# Patient Record
Sex: Male | Born: 1940
Health system: Southern US, Community
[De-identification: ages and names within clinical notes are randomized; demographics above are authoritative.]

## PROBLEM LIST (undated history)

## (undated) DIAGNOSIS — E785 Hyperlipidemia, unspecified: Secondary | ICD-10-CM

## (undated) DIAGNOSIS — I252 Old myocardial infarction: Secondary | ICD-10-CM

## (undated) DIAGNOSIS — C439 Malignant melanoma of skin, unspecified: Secondary | ICD-10-CM

## (undated) DIAGNOSIS — E118 Type 2 diabetes mellitus with unspecified complications: Secondary | ICD-10-CM

## (undated) DIAGNOSIS — M1990 Unspecified osteoarthritis, unspecified site: Secondary | ICD-10-CM

## (undated) DIAGNOSIS — I10 Essential (primary) hypertension: Secondary | ICD-10-CM

## (undated) DIAGNOSIS — M1712 Unilateral primary osteoarthritis, left knee: Secondary | ICD-10-CM

## (undated) DIAGNOSIS — I219 Acute myocardial infarction, unspecified: Secondary | ICD-10-CM

## (undated) DIAGNOSIS — H3530 Unspecified macular degeneration: Secondary | ICD-10-CM

## (undated) DIAGNOSIS — I639 Cerebral infarction, unspecified: Secondary | ICD-10-CM

## (undated) DIAGNOSIS — Z95828 Presence of other vascular implants and grafts: Secondary | ICD-10-CM

## (undated) DIAGNOSIS — I358 Other nonrheumatic aortic valve disorders: Secondary | ICD-10-CM

## (undated) DIAGNOSIS — Z9861 Coronary angioplasty status: Secondary | ICD-10-CM

## (undated) DIAGNOSIS — J309 Allergic rhinitis, unspecified: Secondary | ICD-10-CM

## (undated) DIAGNOSIS — E119 Type 2 diabetes mellitus without complications: Secondary | ICD-10-CM

## (undated) DIAGNOSIS — C679 Malignant neoplasm of bladder, unspecified: Secondary | ICD-10-CM

## (undated) DIAGNOSIS — I2510 Atherosclerotic heart disease of native coronary artery without angina pectoris: Secondary | ICD-10-CM

## (undated) DIAGNOSIS — Z789 Other specified health status: Secondary | ICD-10-CM

## (undated) HISTORY — DX: Hyperlipidemia, unspecified: E78.5

## (undated) HISTORY — DX: Presence of other vascular implants and grafts: Z95.828

## (undated) HISTORY — PX: CARDIAC CATHETERIZATION: SHX172

## (undated) HISTORY — PX: TONSILLECTOMY: SUR1361

## (undated) HISTORY — DX: Atherosclerotic heart disease of native coronary artery without angina pectoris: I25.10

## (undated) HISTORY — DX: Type 2 diabetes mellitus with unspecified complications: E11.8

## (undated) HISTORY — DX: Unspecified osteoarthritis, unspecified site: M19.90

## (undated) HISTORY — DX: Unspecified macular degeneration: H35.30

## (undated) HISTORY — DX: Coronary angioplasty status: Z98.61

## (undated) HISTORY — PX: CATARACT EXTRACTION W/ INTRAOCULAR LENS IMPLANT: SHX1309

## (undated) HISTORY — DX: Old myocardial infarction: I25.2

## (undated) HISTORY — DX: Essential (primary) hypertension: I10

## (undated) HISTORY — PX: INGUINAL HERNIA REPAIR: SUR1180

## (undated) HISTORY — DX: Allergic rhinitis, unspecified: J30.9

## (undated) HISTORY — DX: Other nonrheumatic aortic valve disorders: I35.8

## (undated) HISTORY — DX: Other specified health status: Z78.9

---

## 1979-03-30 DIAGNOSIS — I639 Cerebral infarction, unspecified: Secondary | ICD-10-CM

## 1979-03-30 HISTORY — DX: Cerebral infarction, unspecified: I63.9

## 2000-11-06 ENCOUNTER — Encounter (INDEPENDENT_AMBULATORY_CARE_PROVIDER_SITE_OTHER): Payer: Self-pay | Admitting: Internal Medicine

## 2000-11-06 ENCOUNTER — Ambulatory Visit (HOSPITAL_COMMUNITY): Admission: RE | Admit: 2000-11-06 | Discharge: 2000-11-06 | Payer: Self-pay | Admitting: Internal Medicine

## 2000-11-13 ENCOUNTER — Ambulatory Visit (HOSPITAL_COMMUNITY): Admission: RE | Admit: 2000-11-13 | Discharge: 2000-11-13 | Payer: Self-pay | Admitting: Family Medicine

## 2000-11-13 ENCOUNTER — Encounter: Payer: Self-pay | Admitting: Family Medicine

## 2000-11-20 ENCOUNTER — Ambulatory Visit (HOSPITAL_COMMUNITY): Admission: RE | Admit: 2000-11-20 | Discharge: 2000-11-20 | Payer: Self-pay | Admitting: Internal Medicine

## 2000-11-20 ENCOUNTER — Encounter (INDEPENDENT_AMBULATORY_CARE_PROVIDER_SITE_OTHER): Payer: Self-pay | Admitting: Internal Medicine

## 2001-05-14 ENCOUNTER — Encounter: Payer: Self-pay | Admitting: Family Medicine

## 2001-05-14 ENCOUNTER — Ambulatory Visit (HOSPITAL_COMMUNITY): Admission: RE | Admit: 2001-05-14 | Discharge: 2001-05-14 | Payer: Self-pay | Admitting: Family Medicine

## 2002-04-02 ENCOUNTER — Encounter: Payer: Self-pay | Admitting: Dermatology

## 2002-04-02 ENCOUNTER — Ambulatory Visit (HOSPITAL_COMMUNITY): Admission: RE | Admit: 2002-04-02 | Discharge: 2002-04-02 | Payer: Self-pay | Admitting: Dermatology

## 2002-11-12 ENCOUNTER — Encounter (INDEPENDENT_AMBULATORY_CARE_PROVIDER_SITE_OTHER): Payer: Self-pay | Admitting: Internal Medicine

## 2002-11-12 ENCOUNTER — Ambulatory Visit (HOSPITAL_COMMUNITY): Admission: RE | Admit: 2002-11-12 | Discharge: 2002-11-12 | Payer: Self-pay | Admitting: Internal Medicine

## 2003-02-22 ENCOUNTER — Ambulatory Visit (HOSPITAL_COMMUNITY): Admission: RE | Admit: 2003-02-22 | Discharge: 2003-02-22 | Payer: Self-pay | Admitting: Internal Medicine

## 2003-02-22 ENCOUNTER — Encounter: Payer: Self-pay | Admitting: Internal Medicine

## 2003-03-01 ENCOUNTER — Ambulatory Visit (HOSPITAL_COMMUNITY): Admission: RE | Admit: 2003-03-01 | Discharge: 2003-03-01 | Payer: Self-pay | Admitting: Internal Medicine

## 2003-03-01 ENCOUNTER — Encounter: Payer: Self-pay | Admitting: Internal Medicine

## 2003-03-17 ENCOUNTER — Ambulatory Visit (HOSPITAL_COMMUNITY): Admission: RE | Admit: 2003-03-17 | Discharge: 2003-03-17 | Payer: Self-pay | Admitting: Internal Medicine

## 2003-03-17 ENCOUNTER — Encounter (INDEPENDENT_AMBULATORY_CARE_PROVIDER_SITE_OTHER): Payer: Self-pay | Admitting: Internal Medicine

## 2003-04-12 ENCOUNTER — Encounter (INDEPENDENT_AMBULATORY_CARE_PROVIDER_SITE_OTHER): Payer: Self-pay | Admitting: Internal Medicine

## 2003-04-12 ENCOUNTER — Ambulatory Visit (HOSPITAL_COMMUNITY): Admission: RE | Admit: 2003-04-12 | Discharge: 2003-04-12 | Payer: Self-pay | Admitting: Internal Medicine

## 2003-06-13 ENCOUNTER — Emergency Department (HOSPITAL_COMMUNITY): Admission: EM | Admit: 2003-06-13 | Discharge: 2003-06-13 | Payer: Self-pay | Admitting: Emergency Medicine

## 2003-06-16 ENCOUNTER — Ambulatory Visit (HOSPITAL_COMMUNITY): Admission: RE | Admit: 2003-06-16 | Discharge: 2003-06-16 | Payer: Self-pay | Admitting: Internal Medicine

## 2003-06-29 HISTORY — PX: PARTIAL KNEE ARTHROPLASTY: SHX2174

## 2003-07-13 ENCOUNTER — Inpatient Hospital Stay (HOSPITAL_COMMUNITY): Admission: RE | Admit: 2003-07-13 | Discharge: 2003-07-15 | Payer: Self-pay | Admitting: Orthopaedic Surgery

## 2003-11-27 HISTORY — PX: TOTAL KNEE ARTHROPLASTY: SHX125

## 2003-12-14 ENCOUNTER — Inpatient Hospital Stay (HOSPITAL_COMMUNITY): Admission: RE | Admit: 2003-12-14 | Discharge: 2003-12-19 | Payer: Self-pay | Admitting: Orthopedic Surgery

## 2003-12-22 ENCOUNTER — Ambulatory Visit (HOSPITAL_COMMUNITY): Admission: RE | Admit: 2003-12-22 | Discharge: 2003-12-22 | Payer: Self-pay | Admitting: Orthopedic Surgery

## 2004-01-03 ENCOUNTER — Encounter (HOSPITAL_COMMUNITY): Admission: RE | Admit: 2004-01-03 | Discharge: 2004-02-02 | Payer: Self-pay | Admitting: Orthopedic Surgery

## 2004-02-03 ENCOUNTER — Encounter (HOSPITAL_COMMUNITY): Admission: RE | Admit: 2004-02-03 | Discharge: 2004-03-04 | Payer: Self-pay | Admitting: Orthopedic Surgery

## 2004-02-24 ENCOUNTER — Ambulatory Visit (HOSPITAL_COMMUNITY): Admission: RE | Admit: 2004-02-24 | Discharge: 2004-02-24 | Payer: Self-pay | Admitting: Family Medicine

## 2004-03-06 ENCOUNTER — Encounter (HOSPITAL_COMMUNITY): Admission: RE | Admit: 2004-03-06 | Discharge: 2004-04-05 | Payer: Self-pay | Admitting: Orthopedic Surgery

## 2004-05-31 ENCOUNTER — Ambulatory Visit: Payer: Self-pay | Admitting: Orthopedic Surgery

## 2004-10-10 ENCOUNTER — Ambulatory Visit: Payer: Self-pay | Admitting: Orthopedic Surgery

## 2004-10-18 ENCOUNTER — Ambulatory Visit (HOSPITAL_COMMUNITY): Admission: RE | Admit: 2004-10-18 | Discharge: 2004-10-18 | Payer: Self-pay | Admitting: Dermatology

## 2004-10-23 ENCOUNTER — Ambulatory Visit (HOSPITAL_COMMUNITY): Admission: RE | Admit: 2004-10-23 | Discharge: 2004-10-23 | Payer: Self-pay | Admitting: Dermatology

## 2004-11-21 ENCOUNTER — Ambulatory Visit: Payer: Self-pay | Admitting: Orthopedic Surgery

## 2004-12-10 ENCOUNTER — Ambulatory Visit: Payer: Self-pay | Admitting: Orthopedic Surgery

## 2004-12-19 ENCOUNTER — Ambulatory Visit (HOSPITAL_COMMUNITY): Admission: RE | Admit: 2004-12-19 | Discharge: 2004-12-19 | Payer: Self-pay | Admitting: Rheumatology

## 2004-12-31 ENCOUNTER — Ambulatory Visit (HOSPITAL_COMMUNITY): Payer: Self-pay | Admitting: General Surgery

## 2004-12-31 ENCOUNTER — Encounter (HOSPITAL_COMMUNITY): Admission: RE | Admit: 2004-12-31 | Discharge: 2005-01-30 | Payer: Self-pay | Admitting: General Surgery

## 2005-01-09 ENCOUNTER — Ambulatory Visit (HOSPITAL_COMMUNITY): Admission: RE | Admit: 2005-01-09 | Discharge: 2005-01-09 | Payer: Self-pay | Admitting: Internal Medicine

## 2005-02-18 ENCOUNTER — Ambulatory Visit (HOSPITAL_COMMUNITY): Admission: RE | Admit: 2005-02-18 | Discharge: 2005-02-18 | Payer: Self-pay | Admitting: Family Medicine

## 2005-04-03 ENCOUNTER — Ambulatory Visit (HOSPITAL_COMMUNITY): Payer: Self-pay | Admitting: General Surgery

## 2005-04-03 ENCOUNTER — Encounter (HOSPITAL_COMMUNITY): Admission: RE | Admit: 2005-04-03 | Discharge: 2005-04-27 | Payer: Self-pay | Admitting: General Surgery

## 2005-04-29 ENCOUNTER — Ambulatory Visit (HOSPITAL_COMMUNITY): Admission: RE | Admit: 2005-04-29 | Discharge: 2005-04-29 | Payer: Self-pay | Admitting: Dermatology

## 2005-05-27 ENCOUNTER — Encounter (HOSPITAL_COMMUNITY): Admission: RE | Admit: 2005-05-27 | Discharge: 2005-06-26 | Payer: Self-pay

## 2005-05-30 ENCOUNTER — Encounter (HOSPITAL_COMMUNITY): Admission: RE | Admit: 2005-05-30 | Discharge: 2005-06-29 | Payer: Self-pay | Admitting: General Surgery

## 2005-06-24 ENCOUNTER — Ambulatory Visit (HOSPITAL_COMMUNITY): Admission: RE | Admit: 2005-06-24 | Discharge: 2005-06-24 | Payer: Self-pay | Admitting: Dermatology

## 2005-08-08 ENCOUNTER — Ambulatory Visit (HOSPITAL_COMMUNITY): Payer: Self-pay | Admitting: Family Medicine

## 2005-08-08 ENCOUNTER — Encounter (HOSPITAL_COMMUNITY): Admission: RE | Admit: 2005-08-08 | Discharge: 2005-09-07 | Payer: Self-pay | Admitting: General Surgery

## 2005-10-03 ENCOUNTER — Ambulatory Visit (HOSPITAL_COMMUNITY): Payer: Self-pay | Admitting: General Surgery

## 2005-10-03 ENCOUNTER — Encounter (HOSPITAL_COMMUNITY): Admission: RE | Admit: 2005-10-03 | Discharge: 2005-11-02 | Payer: Self-pay | Admitting: Oncology

## 2005-10-28 ENCOUNTER — Ambulatory Visit (HOSPITAL_COMMUNITY): Admission: RE | Admit: 2005-10-28 | Discharge: 2005-10-28 | Payer: Self-pay | Admitting: Dermatology

## 2005-11-19 ENCOUNTER — Ambulatory Visit (HOSPITAL_BASED_OUTPATIENT_CLINIC_OR_DEPARTMENT_OTHER): Admission: RE | Admit: 2005-11-19 | Discharge: 2005-11-19 | Payer: Self-pay | Admitting: Urology

## 2005-11-26 DIAGNOSIS — C679 Malignant neoplasm of bladder, unspecified: Secondary | ICD-10-CM

## 2005-11-26 HISTORY — DX: Malignant neoplasm of bladder, unspecified: C67.9

## 2005-11-26 HISTORY — PX: OTHER SURGICAL HISTORY: SHX169

## 2005-11-29 ENCOUNTER — Ambulatory Visit (HOSPITAL_COMMUNITY): Payer: Self-pay | Admitting: General Surgery

## 2005-11-29 ENCOUNTER — Encounter (HOSPITAL_COMMUNITY): Admission: RE | Admit: 2005-11-29 | Discharge: 2005-12-29 | Payer: Self-pay | Admitting: Oncology

## 2005-12-06 ENCOUNTER — Ambulatory Visit (HOSPITAL_BASED_OUTPATIENT_CLINIC_OR_DEPARTMENT_OTHER): Admission: RE | Admit: 2005-12-06 | Discharge: 2005-12-06 | Payer: Self-pay | Admitting: Urology

## 2005-12-06 ENCOUNTER — Encounter (INDEPENDENT_AMBULATORY_CARE_PROVIDER_SITE_OTHER): Payer: Self-pay | Admitting: Specialist

## 2006-01-24 ENCOUNTER — Encounter (HOSPITAL_COMMUNITY): Admission: RE | Admit: 2006-01-24 | Discharge: 2006-02-23 | Payer: Self-pay | Admitting: Oncology

## 2006-02-13 ENCOUNTER — Ambulatory Visit: Payer: Self-pay | Admitting: Internal Medicine

## 2006-03-19 ENCOUNTER — Ambulatory Visit (HOSPITAL_COMMUNITY): Payer: Self-pay | Admitting: General Surgery

## 2006-03-19 ENCOUNTER — Encounter (HOSPITAL_COMMUNITY): Admission: RE | Admit: 2006-03-19 | Discharge: 2006-04-18 | Payer: Self-pay | Admitting: General Surgery

## 2006-04-22 ENCOUNTER — Ambulatory Visit (HOSPITAL_COMMUNITY): Admission: RE | Admit: 2006-04-22 | Discharge: 2006-04-22 | Payer: Self-pay | Admitting: Ophthalmology

## 2006-05-01 ENCOUNTER — Ambulatory Visit (HOSPITAL_COMMUNITY): Admission: RE | Admit: 2006-05-01 | Discharge: 2006-05-01 | Payer: Self-pay | Admitting: Dermatology

## 2006-05-14 ENCOUNTER — Encounter (HOSPITAL_COMMUNITY): Admission: RE | Admit: 2006-05-14 | Discharge: 2006-06-13 | Payer: Self-pay | Admitting: Oncology

## 2006-07-09 ENCOUNTER — Encounter (HOSPITAL_COMMUNITY): Admission: RE | Admit: 2006-07-09 | Discharge: 2006-07-28 | Payer: Self-pay | Admitting: General Surgery

## 2006-07-09 ENCOUNTER — Ambulatory Visit (HOSPITAL_COMMUNITY): Payer: Self-pay | Admitting: General Surgery

## 2006-09-12 ENCOUNTER — Encounter (HOSPITAL_COMMUNITY): Admission: RE | Admit: 2006-09-12 | Discharge: 2006-10-12 | Payer: Self-pay | Admitting: Family Medicine

## 2006-09-12 ENCOUNTER — Ambulatory Visit (HOSPITAL_COMMUNITY): Payer: Self-pay | Admitting: Family Medicine

## 2006-11-07 ENCOUNTER — Encounter (HOSPITAL_COMMUNITY): Admission: RE | Admit: 2006-11-07 | Discharge: 2006-12-07 | Payer: Self-pay | Admitting: Family Medicine

## 2006-11-21 ENCOUNTER — Ambulatory Visit (HOSPITAL_COMMUNITY): Payer: Self-pay | Admitting: Family Medicine

## 2006-12-09 ENCOUNTER — Ambulatory Visit (HOSPITAL_COMMUNITY): Admission: RE | Admit: 2006-12-09 | Discharge: 2006-12-09 | Payer: Self-pay | Admitting: Family Medicine

## 2006-12-24 ENCOUNTER — Ambulatory Visit: Payer: Self-pay | Admitting: Internal Medicine

## 2007-01-27 ENCOUNTER — Ambulatory Visit (HOSPITAL_COMMUNITY): Payer: Self-pay | Admitting: Oncology

## 2007-01-27 ENCOUNTER — Encounter (HOSPITAL_COMMUNITY): Admission: RE | Admit: 2007-01-27 | Discharge: 2007-02-26 | Payer: Self-pay | Admitting: Family Medicine

## 2007-04-16 ENCOUNTER — Encounter (HOSPITAL_COMMUNITY): Admission: RE | Admit: 2007-04-16 | Discharge: 2007-04-28 | Payer: Self-pay | Admitting: Family Medicine

## 2007-04-16 ENCOUNTER — Ambulatory Visit (HOSPITAL_COMMUNITY): Payer: Self-pay | Admitting: Oncology

## 2007-06-11 ENCOUNTER — Encounter (HOSPITAL_COMMUNITY): Admission: RE | Admit: 2007-06-11 | Discharge: 2007-07-11 | Payer: Self-pay | Admitting: Pulmonary Disease

## 2007-06-11 ENCOUNTER — Ambulatory Visit (HOSPITAL_COMMUNITY): Payer: Self-pay | Admitting: Pulmonary Disease

## 2007-09-03 ENCOUNTER — Encounter (HOSPITAL_COMMUNITY): Admission: RE | Admit: 2007-09-03 | Discharge: 2007-10-03 | Payer: Self-pay | Admitting: Oncology

## 2007-11-19 ENCOUNTER — Encounter (HOSPITAL_COMMUNITY): Admission: RE | Admit: 2007-11-19 | Discharge: 2007-12-19 | Payer: Self-pay | Admitting: Pulmonary Disease

## 2007-11-19 ENCOUNTER — Ambulatory Visit (HOSPITAL_COMMUNITY): Payer: Self-pay | Admitting: Pulmonary Disease

## 2007-11-27 ENCOUNTER — Inpatient Hospital Stay (HOSPITAL_COMMUNITY): Admission: EM | Admit: 2007-11-27 | Discharge: 2007-11-28 | Payer: Self-pay | Admitting: Emergency Medicine

## 2007-11-27 DIAGNOSIS — I252 Old myocardial infarction: Secondary | ICD-10-CM

## 2007-11-27 HISTORY — PX: CORONARY ANGIOPLASTY WITH STENT PLACEMENT: SHX49

## 2007-11-27 HISTORY — DX: Old myocardial infarction: I25.2

## 2007-11-27 HISTORY — PX: DOPPLER ECHOCARDIOGRAPHY: SHX263

## 2007-12-24 ENCOUNTER — Encounter (HOSPITAL_COMMUNITY): Admission: RE | Admit: 2007-12-24 | Discharge: 2008-01-23 | Payer: Self-pay | Admitting: *Deleted

## 2008-01-14 ENCOUNTER — Encounter (HOSPITAL_COMMUNITY): Admission: RE | Admit: 2008-01-14 | Discharge: 2008-02-13 | Payer: Self-pay | Admitting: Family Medicine

## 2008-01-14 ENCOUNTER — Ambulatory Visit (HOSPITAL_COMMUNITY): Payer: Self-pay | Admitting: Family Medicine

## 2008-01-25 ENCOUNTER — Encounter (HOSPITAL_COMMUNITY): Admission: RE | Admit: 2008-01-25 | Discharge: 2008-02-24 | Payer: Self-pay | Admitting: *Deleted

## 2008-02-26 ENCOUNTER — Encounter (HOSPITAL_COMMUNITY): Admission: RE | Admit: 2008-02-26 | Discharge: 2008-03-27 | Payer: Self-pay | Admitting: *Deleted

## 2008-04-07 ENCOUNTER — Encounter (HOSPITAL_COMMUNITY): Admission: RE | Admit: 2008-04-07 | Discharge: 2008-04-26 | Payer: Self-pay | Admitting: Family Medicine

## 2008-04-07 ENCOUNTER — Ambulatory Visit (HOSPITAL_COMMUNITY): Payer: Self-pay | Admitting: Family Medicine

## 2008-05-03 ENCOUNTER — Ambulatory Visit (HOSPITAL_COMMUNITY): Admission: RE | Admit: 2008-05-03 | Discharge: 2008-05-03 | Payer: Self-pay | Admitting: Dermatology

## 2008-07-13 ENCOUNTER — Ambulatory Visit (HOSPITAL_COMMUNITY): Payer: Self-pay | Admitting: Family Medicine

## 2008-07-13 ENCOUNTER — Encounter (HOSPITAL_COMMUNITY): Admission: RE | Admit: 2008-07-13 | Discharge: 2008-08-12 | Payer: Self-pay | Admitting: Family Medicine

## 2008-09-01 ENCOUNTER — Ambulatory Visit (HOSPITAL_COMMUNITY): Payer: Self-pay | Admitting: Family Medicine

## 2008-09-02 ENCOUNTER — Encounter (HOSPITAL_COMMUNITY): Admission: RE | Admit: 2008-09-02 | Discharge: 2008-10-02 | Payer: Self-pay | Admitting: Family Medicine

## 2008-11-15 ENCOUNTER — Encounter (HOSPITAL_COMMUNITY): Admission: RE | Admit: 2008-11-15 | Discharge: 2008-12-15 | Payer: Self-pay | Admitting: Oncology

## 2008-11-15 ENCOUNTER — Ambulatory Visit (HOSPITAL_COMMUNITY): Payer: Self-pay | Admitting: Oncology

## 2009-02-02 ENCOUNTER — Encounter (HOSPITAL_COMMUNITY): Admission: RE | Admit: 2009-02-02 | Discharge: 2009-03-04 | Payer: Self-pay | Admitting: Family Medicine

## 2009-02-02 ENCOUNTER — Ambulatory Visit (HOSPITAL_COMMUNITY): Payer: Self-pay | Admitting: Family Medicine

## 2009-04-25 ENCOUNTER — Encounter (HOSPITAL_COMMUNITY): Admission: RE | Admit: 2009-04-25 | Discharge: 2009-04-26 | Payer: Self-pay | Admitting: Family Medicine

## 2009-04-25 ENCOUNTER — Ambulatory Visit (HOSPITAL_COMMUNITY): Payer: Self-pay | Admitting: Family Medicine

## 2009-05-20 ENCOUNTER — Emergency Department (HOSPITAL_COMMUNITY): Admission: EM | Admit: 2009-05-20 | Discharge: 2009-05-20 | Payer: Self-pay | Admitting: Emergency Medicine

## 2009-06-15 ENCOUNTER — Ambulatory Visit (HOSPITAL_COMMUNITY): Admission: RE | Admit: 2009-06-15 | Discharge: 2009-06-15 | Payer: Self-pay | Admitting: Dermatology

## 2009-06-20 ENCOUNTER — Encounter (HOSPITAL_COMMUNITY): Admission: RE | Admit: 2009-06-20 | Discharge: 2009-07-20 | Payer: Self-pay | Admitting: Family Medicine

## 2009-06-20 ENCOUNTER — Ambulatory Visit (HOSPITAL_COMMUNITY): Payer: Self-pay | Admitting: Family Medicine

## 2009-08-15 ENCOUNTER — Encounter (HOSPITAL_COMMUNITY): Admission: RE | Admit: 2009-08-15 | Discharge: 2009-09-14 | Payer: Self-pay | Admitting: Family Medicine

## 2009-08-15 ENCOUNTER — Ambulatory Visit (HOSPITAL_COMMUNITY): Payer: Self-pay | Admitting: Family Medicine

## 2009-12-01 ENCOUNTER — Encounter (HOSPITAL_COMMUNITY): Admission: RE | Admit: 2009-12-01 | Discharge: 2009-12-31 | Payer: Self-pay | Admitting: Internal Medicine

## 2009-12-01 ENCOUNTER — Ambulatory Visit (HOSPITAL_COMMUNITY): Payer: Self-pay | Admitting: Internal Medicine

## 2010-03-23 ENCOUNTER — Ambulatory Visit (HOSPITAL_COMMUNITY): Payer: Self-pay | Admitting: Family Medicine

## 2010-03-23 ENCOUNTER — Encounter (HOSPITAL_COMMUNITY): Admission: RE | Admit: 2010-03-23 | Discharge: 2010-04-22 | Payer: Self-pay | Admitting: Oncology

## 2010-04-06 ENCOUNTER — Ambulatory Visit (HOSPITAL_COMMUNITY): Admission: RE | Admit: 2010-04-06 | Discharge: 2010-04-06 | Payer: Self-pay | Admitting: Orthopedic Surgery

## 2010-06-28 ENCOUNTER — Encounter (HOSPITAL_COMMUNITY)
Admission: RE | Admit: 2010-06-28 | Discharge: 2010-07-28 | Payer: Self-pay | Source: Home / Self Care | Attending: Internal Medicine | Admitting: Internal Medicine

## 2010-06-28 ENCOUNTER — Ambulatory Visit (HOSPITAL_COMMUNITY): Payer: Self-pay | Admitting: Internal Medicine

## 2010-07-29 DIAGNOSIS — I219 Acute myocardial infarction, unspecified: Secondary | ICD-10-CM

## 2010-07-29 HISTORY — DX: Acute myocardial infarction, unspecified: I21.9

## 2010-08-18 ENCOUNTER — Encounter: Payer: Self-pay | Admitting: Dermatology

## 2010-08-18 ENCOUNTER — Encounter: Payer: Self-pay | Admitting: General Surgery

## 2010-09-17 ENCOUNTER — Ambulatory Visit (HOSPITAL_COMMUNITY): Payer: Medicare Other

## 2010-09-17 ENCOUNTER — Encounter (HOSPITAL_COMMUNITY): Payer: Medicare Other

## 2010-09-17 ENCOUNTER — Encounter (HOSPITAL_COMMUNITY): Payer: Medicare Other | Attending: Oncology

## 2010-09-17 DIAGNOSIS — Z452 Encounter for adjustment and management of vascular access device: Secondary | ICD-10-CM | POA: Insufficient documentation

## 2010-09-20 ENCOUNTER — Other Ambulatory Visit (HOSPITAL_COMMUNITY): Payer: Self-pay

## 2010-10-11 ENCOUNTER — Other Ambulatory Visit: Payer: Self-pay | Admitting: Dermatology

## 2010-10-31 LAB — CREATININE, SERUM: Creatinine, Ser: 0.91 mg/dL (ref 0.4–1.5)

## 2010-11-01 LAB — DIFFERENTIAL
Basophils Relative: 1 % (ref 0–1)
Eosinophils Absolute: 0.2 10*3/uL (ref 0.0–0.7)
Lymphocytes Relative: 19 % (ref 12–46)
Lymphs Abs: 1.1 10*3/uL (ref 0.7–4.0)
Neutro Abs: 3.8 10*3/uL (ref 1.7–7.7)

## 2010-11-01 LAB — BASIC METABOLIC PANEL
BUN: 10 mg/dL (ref 6–23)
CO2: 30 mEq/L (ref 19–32)
Chloride: 104 mEq/L (ref 96–112)
GFR calc non Af Amer: >60 mL/min (ref >60)
Glucose, Bld: 90 mg/dL (ref 70–99)
Potassium: 3.4 mEq/L — ABNORMAL LOW (ref 3.5–5.1)
Sodium: 140 mEq/L (ref 135–145)

## 2010-11-01 LAB — CBC
HCT: 40.2 % (ref 39.0–52.0)
Hemoglobin: 13.7 g/dL (ref 13.0–17.0)
MCHC: 34.2 g/dL (ref 30.0–36.0)
RBC: 4.31 MIL/uL (ref 4.22–5.81)

## 2010-11-15 ENCOUNTER — Encounter (HOSPITAL_COMMUNITY): Payer: Medicare Other | Attending: Oncology

## 2010-11-15 DIAGNOSIS — Z452 Encounter for adjustment and management of vascular access device: Secondary | ICD-10-CM | POA: Insufficient documentation

## 2010-12-11 NOTE — Discharge Summary (Signed)
NAME:  Arthur Lutz, Arthur Lutz NO.:  [id number]   MEDICAL RECORD NO.:  [id number]          PATIENT TYPE:  INP   LOCATION:  2925                         FACILITY:  [facility]   PHYSICIAN:  Dani Gobble, MD       DATE OF BIRTH:  1940/09/29   DATE OF ADMISSION:  11/27/2007  DATE OF DISCHARGE:  11/28/2007                               DISCHARGE SUMMARY   DISCHARGE DIAGNOSES:  1. Subendocardial myocardial infarction by troponins this admission      with a peak troponin of 0.26 and negative CK-MB.  2. Coronary disease with two-vessel intervention this admission with a      driver stent to the right coronary artery and angioplasty to the      first diagonal by Arthur Lutz on Nov 27, 2007.  3. Normal left ventricle function.  4. Dyslipidemia, statin added this admission.  5. History of bladder cancer.  6. Remote cerebrovascular accident.  7. History of treated hypertension.  8. Baseline bradycardia, not discharged on beta-blocker.   HOSPITAL COURSE:  Arthur Lutz is a 70 year old male followed by Dr.  [name] and Arthur Lutz with a history of hypertension and prior CVA  and bladder cancer followed by Arthur Lutz.  He was seen on Nov 27, 2007,  as an add-on with chest pain as a new patient in [place].  Dr.  [name] arranged for him to be transferred to [facility] as she felt that he  was having unstable angina.  The patient was seen in the emergency room  at [facility] and evaluated by Arthur Lutz and set up for catheterization,  which was done that day.  This revealed subtotal first diagonal, normal  LAD, normal circumflex and normal OMs, 80%, mid RCA with normal LV  function.  The patient underwent intervention of the RCA with a driver  stent and intervention of the first diagonal with a balloon angioplasty.  Arthur Lutz did not feel a stent could be safely placed into the diagonal  without risk to the LAD.  The patient tolerated the procedure well.  CK-  MBs were negative.  His troponin did go  to 0.26.  He has been  transferred to the step-down unit and ambulated.  Lipid panel shows an  LDL of 147, HDL 32, and cholesterol 199.  Arthur Lutz added  Crestor 20 mg at discharge.  We feel the patient could be discharged on  Nov 28, 2007 and will follow up with Arthur Lutz in a week or two in  [place].  He has been instructed to not work until Arthur Lutz  clears him.   DISCHARGE MEDICATIONS:  1. Benicar 40 mg one-half tablet a day.  2. Norvasc 10 mg a day.  3. Multivitamin daily.  4. Nexium 40 mg a day.  5. Calcium 2 tablets a day.  6. Advil p.r.n.  7. Tylenol p.r.n.  8. Trazodone p.r.n.  9. Coated aspirin 325 mg a day.  10.Plavix 75 mg a day.  11.Crestor 20 mg a day.  12.Nitroglycerin sublingual p.r.n.   LABORATORY DATA:  Troponin peaked at 0.26.  Lipid panel was as noted  above.  Renal function at discharge shows sodium 138, potassium 3.5, BUN  9, and creatinine 0.9.  White count 6.6, hemoglobin 13.2, hematocrit 38,  and platelets 176.  Portable chest on Nov 27, 2007, shows no acute  findings.  EKG shows sinus rhythm with nonspecific ST changes.   DISPOSITION:  The patient is discharged in stable condition and will  follow up with Arthur Lutz.      Arthur Lutz, P.A.    ______________________________  Dani Gobble, MD    LKK/MEDQ  D:  11/28/2007  T:  11/28/2007  Job:  [id number]   cc:   Arthur Lutz, M.D.

## 2010-12-11 NOTE — Assessment & Plan Note (Signed)
NAME:  Arthur Lutz, Arthur Lutz                 CHART#:  [id number]   DATE:                                   DOB:  Mar 05, 1941   PRESENTING COMPLAINT:  Recurrent abdominal pain and bloating.   SUBJECTIVE:  The patient is a 70 year old Caucasian male patient of Dr.  Regino Schultze who is here for a scheduled visit.  He was last seen in July of  2007.  He remains with GI problems.  He has bloating virtually everyday  but at times is intractable.  He also complains of abdominal pain which  is mainly left mid-abdomen but occasionally on right upper quadrant and  the ribcage.  His wife suggest that he cut back on eating seeds and  nuts.  He has noted improvement since doing this however he has not felt  100% better.  He saw Dr. Malvin Johns and had a HIDA scan with ultrasound.  His EF was 5%, his EF back 6 years ago was 43%.  Cholecystectomy was  advised and planned but he just could not get the procedure done.  He  states his bloating and abdominal pain is associated with his meals.  He  is using Gas-X which helps some.  When he has pain it is cramping,  twisting, and gut wrenching.  When he has pain and bloating he notices  rectal discharge.  He denies nausea, vomiting, fever or chills.  He has  good appetite but afraid to eat.  He has gained 5 pounds in the last 10  months.   The patient has undergone multiple studies in the past.  He had a  colonoscopy in 2001 revealing a few diverticulum sigmoid colon and 2  hyperplastic polyps.  With a negative family history it was felt he  would not need a colonoscopy until October 2011.  He has had ultrasound  and HIDA scan about 6 years ago and more recently late last year.  He  also had abdominopelvic CT in September 2004, negative other than renal  cyst and he also had small bowel follow through, in 2004 which was  essentially negative.  He had an ultrasound in 2004 which was negative.  He also had normal small bowel study in 2004.   MEDICATIONS:  1. He is  presently on Benicar/HCTZ 12.5 daily  2. Norvasc 5 mg daily  3. Glucosamine 500 mg daily  4. Advil 1-2 daily p.r.n. knee pain  5. Halcion 0.25 mg q.h.s. p.r.n. which is not helping   PAST MEDICAL HISTORY:  1. He has chronic insomnia.  He is unable to sleep well at night.  He      is always tired in the morning.  2. He had melanoma removed in 2003, he gets chest CT every 6 months      that has been negative.  3. He has osteoarthritis.  4. He has had knee arthroscopy twice which has not helped.  5. He has had left inguinal herniorrhaphy.  6. History of IBS.  7. Psoriatic arthritis.  8. He had bladder tumor removed by Dr. Earlene Plater via cystoscopy 18 months      ago and he gets examined every 6 months and has not recurrence.   ALLERGIES:  NKA.   FAMILY HISTORY:  Negative for colorectal carcinoma.   PHYSICAL EXAMINATION:  VITAL SIGNS:  He is 247 pounds, 6 feet tall,  pulse 60 per minute, blood pressure 150/88, temp is 98.1.  HEENT:  Conjunctivae is pink.  Sclera is nonicteric.  Oropharyngeal  mucosa is normal.  NECK:  No mass is noted.  ABDOMEN:  Is protuberant.  Bowel sounds are normal.  On palpation he has  mild tenderness at LLQ, no organomegaly or masses.  RECTAL EXAMINATION:  Reveals guaiac negative stool and no nodular  prostate, his right lobe is slightly firmer than the left.  EXTREMITIES:  He is not have clubbing or peripheral edema.   ASSESSMENT:  The patient has had bloating and abdominal pain  chronically. In the past I have felt  he has  irritable bowel syndrome  which I believe still holds.  However, he could have some of his  symptoms due to dysfunctional gallbladder.  I doubt that all his  symptoms will be alleviated with surgery but I hope that most would be.  He has not responded to antispasmodic therapy in the past.  Since his  bloating appears to be intractable at times therapy with antibiotics  would be appropriate.   PLAN:  Treat him with Xifaxan 400 mg p.o.  t.i.d. for 10 days.  He will  call us with a progress report when he finishes therapy or if he has any  side effects.       Lionel December, M.D.  Electronically Signed     NR/MEDQ  D:  12/24/2006  T:  12/25/2006  Job:  [id number]   cc:   Kirk Ruths, M.D.

## 2010-12-11 NOTE — H&P (Signed)
NAME:  Arthur Lutz, Arthur Lutz NO.:  [id number]   MEDICAL RECORD NO.:  [id number]          PATIENT TYPE:   LOCATION:                                 FACILITY:   PHYSICIAN:  Dani Gobble, MD       DATE OF BIRTH:  07-23-1941   DATE OF ADMISSION:  DATE OF DISCHARGE:                              HISTORY & PHYSICAL   REFERRING PHYSICIAN:  Patrica Duel, M.D.   Arthur Lutz is a very pleasant 70 year old gentleman with past medical  history of hypertension and untreated hyperlipidemia as well as reflux  disease, osteoarthritis and a remote stroke, which may have been Bell's  palsy, and osteoarthritis.  He has had melanoma 5 years ago which was  excised, and he is followed by Dr. Emily Filbert in dermatology annually.  He  has had no obvious recurrence.  He has had bladder cancer which has been  managed by Dr. Earlene Plater.  Other past medical history includes a right total  knee replacement that was infected with Staph aureus and had to be  removed.  He has a left lung nodule that is being followed by CT scan  yearly by Dr. Nobie Putnam.   We are asked to see him as an add-on for chest pain.  He reports a 2-3-  week history of chest pain which is clearly exertional.  Whenever he  walks out [place] or up a hill and heart rate increased, he  experiences a chest pain that is associated with shortness of breath.  It resolves with rest after approximately 5 minutes.  There has been no  change in the pattern over the 3-week period.  There is no associated  nausea, vomiting or diaphoresis.   Other than the symptoms over the last 3 weeks, prior to that he had  noted no change in activity tolerance over the past year and no  shortness of breath or dyspnea on exertion.  He denies palpitations,  dizziness, presyncope or syncope.  He denies lower extremity edema or  PND.  He sleeps on two pillows for his reflux disease.  He sleeps quite  poorly and may well have sleep apnea.  He exercises walking on  the  treadmill but at a slow pace due to his knee approximately three times  per week.  However, he manages a cattle farm and leads a very active  lifestyle with quite a bit of walking daily.   PAST MEDICAL HISTORY:  1. Hypertension.  2. Reflux disease.  3. Osteoarthritis.  4. Stroke, which may well have been Bell's palsy, 30 years ago.  5. Melanoma 5 years ago, status post excision, managed by Dr. Emily Filbert in      dermatology.  6. Bladder cancer, managed by Dr. Earlene Plater.  7. Right total knee replacement, which became infected with Staph      aureus and had to be removed  8. Left lung nodule followed by CT scan yearly with Dr. Nobie Putnam.   ALLERGIES:  None known.   CURRENT MEDICATIONS:  1. Norvasc 5 mg daily.  2. Benicar 5 mg daily.  3.  Nexium 40 mg daily.  4. Multivitamin daily.  5. Advil 2 tablets daily.  6. Sinutab p.r.n.   SOCIAL HISTORY:  He quit smoking 30 years ago.  He drinks alcohol  occasionally.  He denies illicit drug use.  He is married with two adult  children.  He owns a cattle farm.   FAMILY HISTORY:  Notable for emphysema in his father, a stroke in his  mother at a very advanced age.  There is no history of CAD.  He does  have a family history of melanoma.   REVIEW OF SYSTEMS:  GENERAL:  As outlined above, otherwise negative for  weight change, cold, flu or night sweats.  MUSCULOSKELETAL:  He does  complain of some burning on his feet, which sounds like a neuropathy.  SKIN:  As noted above with prior melanoma, checked yearly.  EYES:  He  does were glasses.  RESPIRATORY/CARDIOVASCULAR:  As outlined above.  GI:  Notable for indigestion and reflux disease.  No blood in his stool.  GU:  Notable for increased frequency and urgency and he does have an enlarged  prostate managed by Dr. Earlene Plater.  ENDOCRINE:  He has a hemoglobin A1c of  6.3, which would suggest metabolic syndrome versus early diabetes.  No  thyroid disease.  NEUROLOGIC:  Interestingly, he admitted to   lightheadedness to the nurse and denied as much to me.  ENT: Notable for  occasional tooth infections.  PSYCHIATRIC:  Negative.  HEMATOLOGIC/LYMPHATIC:  Negative.  VASCULAR:  Negative.   PHYSICAL EXAM:  A pleasant white male in no acute distress, alert and  oriented x3.  Height 6 feet 1 inch tall, weight 248-1/2 pounds.  Blood pressure on the  left 140/80, on the right 150/80, with pulse of 54.  NECK:  Negative for JVD at 90 degrees.  No bruits are noted.  No  lymphadenopathy, no thyromegaly.  LUNGS:  Clear throughout without crackles, wheezes or rhonchi noted.  CARDIAC:  A bradycardic rate with a regular rhythm and a 2/6 systolic  murmur heard best at the left sternal border.  I do not appreciate an S3  or S4.  There is no heave or lift.  PMI does not appear to be displaced.  The carotid upstroke is normal, 2+ bilaterally without delay.  ABDOMEN:  Soft, nontender, nondistended, with positive bowel sounds in  all four quadrants.  No hepatosplenomegaly, masses or bruits were  detected.  LOWER EXTREMITIES:  Negative or edema.  Distal pulses 2+ and equal  bilaterally.   EKG performed in the office today reveals sinus bradycardia at 54 beats  per minute, not on a negative chronotrope.  No acute ischemic changes  are noted.   Lab work from October 26, 2007:  Sodium 143, potassium 4.2, chloride 103,  bicarb 29, glucose 106, BUN 13, creatinine 1.0, total bilirubin 0.6,  alkaline phosphatase 70, AST 18, ALT 24, total protein 6.6, albumin 4.6,  calcium 9.4.  Total cholesterol 200, triglycerides 82, HDL 39, LDL 145.  Hemoglobin A1c of 6.3.  PSA 3.06.  I do not have any of his prior chest  x-rays or CT scans.   IMPRESSION:  1. Chest discomfort, quite worrisome for unstable angina.  2. Hypertension.  3. Reflux disease.  4. Osteoarthritis.  5. Remote stroke, which may well have been Bell's palsy by      description.  6. Melanoma, status post excision 5 years ago, managed by Dr. Emily Filbert in       dermatology.  7.  Bladder cancer, managed by Dr. Earlene Plater past.  8. Past surgical history of right total knee replacement which was      infected with Staphylococcus and was later removed.  9. Left lung nodule followed by Dr. Nobie Putnam with annual CT scans.  10.Early diabetes versus metabolic syndrome, although the he does not      have the usual dyslipidemic pattern to support this but he does      have the abdominal girth size.   RECOMMENDATIONS:  1. Admit to [facility] to telemetry for catheterization      today.  I have discussed this with Dr. Clarene Duke and he is expecting      the patient.  2. He will be on telemetry.  3. No room for beta blocker with a heart rate of 54.  4. Will need to continue current medications including Norvasc and      Benicar and will need aspirin 81 mg daily.  5. IV heparin on arrival.  6. IV nitroglycerin for recurrent chest discomfort.  7. We will start simvastatin 40 mg for an LDL of 145.  8. Labs to include a CBC with differential, PT/PTT, complete metabolic      panel, fasting lipid profile and serial cardiac enzymes.  9. More recommendations to follow cardiac catheterization.           ______________________________  Dani Gobble, MD     AB/MEDQ  D:  11/27/2007  T:  11/27/2007  Job:  [id number]

## 2010-12-11 NOTE — Cardiovascular Report (Signed)
NAME:  Arthur Lutz, Arthur Lutz NO.:  [id number]   MEDICAL RECORD NO.:  [id number]          PATIENT TYPE:  INP   LOCATION:  2925                         FACILITY:  [facility]   PHYSICIAN:  Thereasa Solo. Little, M.D. DATE OF BIRTH:  11-01-1940   DATE OF PROCEDURE:  DATE OF DISCHARGE:                            CARDIAC CATHETERIZATION   HISTORY:  This is a 70 year old male has hypertension and  hyperlipidemia.  He has developed exertional chest pain over the last 2  weeks.  He can currently walk about half a mile and then has a tightness  in his chest with severe shortness of breath that makes him stop in his  tracks.  He has had no resting pain.  His EKG is completely normal, and  he is sent from Dr. Roque Lias office to [facility] for urgent  cardiac catheterization.   After obtaining informed consent, the patient was prepped and draped in  the usual sterile fashion exposing the right groin.  Applying local  anesthesia 1% Xylocaine with Seldinger technique was employed and 5-  [place] introducer sheath was placed on the right femoral artery.  Left  and right coronary arteriography, ventriculography, and intervention to  the RCA, and proximal and ostial first diagonal was undertaken.   COMPLICATIONS:  None.   RESULTS:  1. Hemodynamic monitoring:  Central aortic pressure was 182/95, left      ventricular pressure was 182/21, with no significant gradient at      the time of pullback.  2. Ventriculography:  Ventriculography in the RAO projection revealed      normal LV systolic function.  The ejection fraction was in excess      of 60% and the end diastolic pressure was 24.  There were no focal      wall motion abnormalities.   CORONARY ARTERIES:  Coronary arteriography.  1. Left main was about 5 mm in diameter and bifurcated.  2. LAD.  The LAD was a 4-mm vessel and it tapered as it extended down      across the apex of the heart.  The LAD itself was free of disease      as  was the second diagonal.  The first diagonal had a subtotal      proximal/ostial area.  This was about a 2 mm vessel.  There was      TIMI-1 flow with the initial evaluation through this vessel.  3. Circumflex.  There were 2OM vessels in the circumflex system from      both of which were free of disease as well as the ongoing      circumflex.  4. Right coronary artery.  The right coronary artery was a 4 mm lesion      with an 80% concentric area of narrowing in its mid portion.  I did      not detect any calcium in this area.  The distal right PDA,      posterolateral vessels were free of disease.   Because of the high-grade stenosis in the right and the proximal  diagonals, the decision was made  to proceed on with intervention.  The  system was upgraded to 6-French system.  The patient was given double  bolus Integrilin, 6500 units IV heparin, and once an ACT in excess of  200 was obtained, the procedure was started.  A JR4 guide catheter and a  short loose wire was used.  Primary stenting with a 3.5 x 15 Driver  stent was accomplished with the initial inflation being 16 atmospheres  for 40 seconds and the second inflation being 17 atmospheres for 38  seconds.  Post-dilatation was then accompanied with a noncompliant  springer balloon 4.0 x 12 with inflation of 10 atmospheres for 45  seconds.  This 80% area in the right coronary artery pre-intervention  appeared to be normal.  Postintervention, there was no evidence of any  distal dissection or thrombus formation.  With each in place, the  patient had duplication of the chest pain he experienced with his  walking.   Attention was then addressed to the first diagonal.  A JL4 guide  catheter and the same short loose wire was used.  The wire was finally  placed down the diagonal, but it was difficult to get into the ostium  because of high-grade stenosis.  A 2.0 x 10 cutter balloon would not  cross initially and after it had been  predilated with a small balloon.   A springer 1.5 x 12 mm balloon was placed in the ostium and a total 3  inflations 10/35, 10/32, 10/35 were obtained.   A 2.25 x 10 Dura star balloon was then placed in the area of the ostium  and most proximal segmental vessel and a series of 5 inflations the most  aggressive one being 15 atmospheres for 40 seconds with the final 2  inflations being 7 atmospheres for 45 and 8 atmospheres for 40 seconds.   This area has been 99% pre-intervention with only TIMI 1 flow.  Now was  approximately 30% narrowed with TIMI 3 flow.  I did not feel that I  could safely place the stent in this area without jeopardizing the LAD  itself.  This is a small vessel, and with each inflation, the patient  had no symptoms when I was working on the ostium of the diagonal.   He will be placed on ACE inhibitor, beta-blockers, and statin therapy.  His blood pressures had been elevated.  He was started on IV  nitroglycerin.  He should be ready for home on either Saturday or  Sunday.  He was given 600 mg of oral Plavix at the end of the procedure.           ______________________________  Thereasa Solo. Little, M.D.     ABL/MEDQ  D:  11/27/2007  T:  11/28/2007  Job:  [id number]   cc:   Dani Gobble, MD  Madelin Rear. Sherwood Gambler, MD  Cath Lab

## 2010-12-14 NOTE — Op Note (Signed)
NAME:  Arthur Lutz, Arthur Lutz                ACCOUNT NO.:  [id number]   MEDICAL RECORD NO.:  [id number]          PATIENT TYPE:  AMB   LOCATION:  [facility]                         FACILITY:  [facility]   PHYSICIAN:  Ronald L. Earlene Plater, M.D.  DATE OF BIRTH:  1941/02/09   DATE OF PROCEDURE:  12/06/2005  DATE OF DISCHARGE:                                 OPERATIVE REPORT   DIAGNOSIS:  Bladder lesion.   OPERATIVE PROCEDURE:  Cold cup removal of bladder lesion.   SURGEON:  Lucrezia Starch. Earlene Plater, M.D.   ANESTHESIA:  LMA.   ESTIMATED BLOOD LOSS:  Negligible.   TUBES:  None.   COMPLICATIONS:  None.   INDICATION FOR PROCEDURE:  [name] is a very nice 70 year old white male  who was found to have a bladder lesion on cystourethroscopy.  He had  developed also a right epididymitis that has been treated.  He has had the  epididymitis treated.  It is certainly less painful at this point.  After  understanding the risks, benefits, and alternatives, he has elected to  proceed.   PROCEDURE IN DETAIL:  The patient was placed in the supine position, after  proper LMA anesthesia was placed in the dorsal lithotomy position and  prepped and draped with Betadine in a sterile fashion.  Cystourethroscopy  was performed with the 22.5 French Olympus endoscope.  Utilizing the 12 and  70 degree lenses, the bladder was carefully inspected.  Efflux of clear  urine was noted from the normally-placed ureteral orifices bilaterally, and  there was mild trilobar hypertrophy and grade 1 trabeculation of the  bladder.  Just lateral and posterior to the ureteral orifice on the lateral  sidewall, there was an approximately 1 mm frondular lesion, flesh-colored,  that could be inflammatory or certainly could be an early transitional cell  carcinoma.  This was removed in its entirety with the cold cup biopsy  forceps and submitted to pathology.  The base was cauterized with Bugbee  coagulation cautery.  There were no other lesions noted.  The  bladder was  drained.  The panendoscope was removed and the patient was taken to the  recovery room stable.      Ronald L. Earlene Plater, M.D.  Electronically Signed     RLD/MEDQ  D:  12/06/2005  T:  12/07/2005  Job:  [id number]

## 2010-12-14 NOTE — Group Therapy Note (Signed)
NAME:  CHIOKE, NOXON                          ACCOUNT NO.:  [id number]   MEDICAL RECORD NO.:  [id number]                   PATIENT TYPE:  INP   LOCATION:  A336                                 FACILITY:  [facility]   PHYSICIAN:  Vickki Hearing, M.D.           DATE OF BIRTH:  1940-08-17   DATE OF PROCEDURE:  DATE OF DISCHARGE:                                   PROGRESS NOTE   SUBJECTIVE:  Status post revision of right knee implant.  Conversion to a  total-knee replacement.  Cultures are still negative.   OBJECTIVE:  We will get a sed rate today, C-reactive protein scheduled for  today as well.  He did complain of some nausea.  I think this is because he  has not had a bowel movement, and he asked for a suppository.  We hope that  should improve that.  He is on Phenergan for the nausea.   ASSESSMENT:  His knee looks good.  His wound looks fine.  He is  neurovascularly intact.  He is awake and alert.  We stopped his O2.  Recommend a discharge planning starting tomorrow.  He does want a hospital  bed and a table so he can work at home.  Hopefully, he will be able to get  this through insurance, but he has indicated that he will pay for it if he  does not get it through insurance.   PLAN:  Continue postoperative protocol for total knee replacement.      ___________________________________________                                            Vickki Hearing, M.D.   SEH/MEDQ  D:  12/18/2003  T:  12/18/2003  Job:  [id number]

## 2011-02-20 ENCOUNTER — Ambulatory Visit (HOSPITAL_BASED_OUTPATIENT_CLINIC_OR_DEPARTMENT_OTHER)
Admission: RE | Admit: 2011-02-20 | Discharge: 2011-02-20 | Disposition: A | Discharge status: Home / Self Care | Payer: Medicare Other | Source: Ambulatory Visit | Attending: Orthopedic Surgery | Admitting: Orthopedic Surgery

## 2011-02-20 DIAGNOSIS — X58XXXA Exposure to other specified factors, initial encounter: Secondary | ICD-10-CM | POA: Insufficient documentation

## 2011-02-20 DIAGNOSIS — M2240 Chondromalacia patellae, unspecified knee: Secondary | ICD-10-CM | POA: Insufficient documentation

## 2011-02-20 DIAGNOSIS — Z79899 Other long term (current) drug therapy: Secondary | ICD-10-CM | POA: Insufficient documentation

## 2011-02-20 DIAGNOSIS — E785 Hyperlipidemia, unspecified: Secondary | ICD-10-CM | POA: Insufficient documentation

## 2011-02-20 DIAGNOSIS — IMO0002 Reserved for concepts with insufficient information to code with codable children: Secondary | ICD-10-CM | POA: Insufficient documentation

## 2011-02-20 DIAGNOSIS — I10 Essential (primary) hypertension: Secondary | ICD-10-CM | POA: Insufficient documentation

## 2011-02-20 DIAGNOSIS — I2510 Atherosclerotic heart disease of native coronary artery without angina pectoris: Secondary | ICD-10-CM | POA: Insufficient documentation

## 2011-02-20 DIAGNOSIS — Z7982 Long term (current) use of aspirin: Secondary | ICD-10-CM | POA: Insufficient documentation

## 2011-02-20 HISTORY — PX: KNEE ARTHROSCOPY: SHX127

## 2011-02-20 LAB — POCT I-STAT 4, (NA,K, GLUC, HGB,HCT)
Glucose, Bld: 133 mg/dL — ABNORMAL HIGH (ref 70–99)
Hemoglobin: 13.9 g/dL (ref 13.0–17.0)
Potassium: 3.5 mEq/L (ref 3.5–5.1)

## 2011-02-20 LAB — GLUCOSE, CAPILLARY: Glucose-Capillary: 144 mg/dL — ABNORMAL HIGH (ref 70–99)

## 2011-02-21 NOTE — Op Note (Signed)
NAME:  Arthur Lutz, Arthur Lutz                ACCOUNT NO.:  [id number]  MEDICAL RECORD NO.:  [id number]  LOCATION:  [facility]                          FACILITY:  [facility]  PHYSICIAN:  Ollen Gross, M.D.    DATE OF BIRTH:  1941/01/30  DATE OF PROCEDURE: DATE OF DISCHARGE:                              OPERATIVE REPORT   PREOPERATIVE DIAGNOSIS:  Left knee medial meniscal tear.  POSTOPERATIVE DIAGNOSIS:  Left knee medial meniscal tear, plus chondral defects.  PROCEDURE:  Left knee arthroscopy with meniscal debridement and chondroplasty.  SURGEON:  Ollen Gross, M.D.  ASSISTANT.:  None.  ANESTHESIA:  General.  ESTIMATED BLOOD LOSS:  Minimal.  DRAINS:  None.  COMPLICATIONS:  None.  CONDITION:  Stable to recovery.  CLINICAL NOTE:  [name] is a 70 year old male who has several-month history of significant left knee pain, recurrent effusions and mechanical symptoms.  Exam and history suggested medial meniscal tear plus chondral defect.  It was confirmed by MRI.  He presents for arthroscopy and debridement.  PROCEDURE IN DETAIL:  After successful administration of general anesthetic, a tourniquet was placed high on the left thigh and left lower extremity prepped and draped in the usual sterile fashion. Standard superomedial inferolateral incisions were made, inflow cannula passed, superomedial camera passed inferolateral.  Arthroscopic visualization proceeds.  The undersurface of the patella had some grade 2 changes and the trochlea had some grade 2 to 3 and some focal area of grade 4 change.  There was some unstable cartilage in the trochlea centrally.  Medial and lateral gutters were visualized.  There were no loose bodies.  Flexion valgus force was applied to the knee and the medial compartment was entered.  There was evidence of a tear in the body and posterior horn of the medial meniscus as well as significant grade 2 and 3 changes in the medial femoral condyle.  Spinal needle  was used to localize the inferomedial portal.  Small incision made, dilator placed.  The meniscus was debrided back to a stable base with baskets and a 4.2-mm shaver and sealed off with the ArthroCare device.  The unstable cartilage undersurface of medial femoral condyle was debrided back to a stable bony base at about  1 x 1 cm area and stable cartilaginous base for another 1 x 2 cm area.  It was found to be stable after this.  I abraded the exposed bone.  The intercondylar notch was visualized.  The ACL looked normal.  Lateral compartment was normal.  We then addressed the trochlea and the unstable cartilage, undersurface of the trochlea debrided back to stable cartilaginous base throughout most of defect and then centrally stable bony base at about 1 x 1 cm area. We abraded that bone also.  The joint was again inspected.  No other tears, defects or loose bodies noted.  Arthroscopic equipment was removed from the inferior portals which were closed with interrupted 4-0 nylon.  A 20 cc of 0.25% Marcaine with epi was injected through the inflow cannula, then that was removed and that portal closed with nylon. A bulky sterile dressing was then applied and he was awakened and transported to recovery in stable condition.  Ollen Gross, M.D.     FA/MEDQ  D:  02/20/2011  T:  02/20/2011  Job:  [id number]  Electronically Signed by Ollen Gross M.D. on 02/21/2011 03:29:59 PM

## 2011-03-08 ENCOUNTER — Encounter (HOSPITAL_COMMUNITY): Payer: Medicare Other | Attending: Family Medicine

## 2011-03-08 ENCOUNTER — Telehealth (HOSPITAL_COMMUNITY): Payer: Self-pay

## 2011-03-08 DIAGNOSIS — Z452 Encounter for adjustment and management of vascular access device: Secondary | ICD-10-CM | POA: Insufficient documentation

## 2011-03-08 NOTE — Progress Notes (Signed)
Port accessed and flushed per clinic protocol.  Good blood return.    Tolerated well.

## 2011-04-12 ENCOUNTER — Other Ambulatory Visit (HOSPITAL_COMMUNITY): Payer: Self-pay | Admitting: Dermatology

## 2011-04-12 DIAGNOSIS — R0602 Shortness of breath: Secondary | ICD-10-CM

## 2011-04-12 DIAGNOSIS — C439 Malignant melanoma of skin, unspecified: Secondary | ICD-10-CM

## 2011-04-12 DIAGNOSIS — R918 Other nonspecific abnormal finding of lung field: Secondary | ICD-10-CM

## 2011-04-16 ENCOUNTER — Ambulatory Visit (HOSPITAL_COMMUNITY)
Admission: RE | Admit: 2011-04-16 | Discharge: 2011-04-16 | Disposition: A | Discharge status: Home / Self Care | Payer: Medicare Other | Source: Ambulatory Visit | Attending: Dermatology | Admitting: Dermatology

## 2011-04-16 DIAGNOSIS — R0602 Shortness of breath: Secondary | ICD-10-CM

## 2011-04-16 DIAGNOSIS — J984 Other disorders of lung: Secondary | ICD-10-CM | POA: Insufficient documentation

## 2011-04-16 DIAGNOSIS — R918 Other nonspecific abnormal finding of lung field: Secondary | ICD-10-CM

## 2011-04-16 DIAGNOSIS — C439 Malignant melanoma of skin, unspecified: Secondary | ICD-10-CM

## 2011-04-16 MED ORDER — IOHEXOL 300 MG/ML  SOLN
80.0000 mL | Freq: Once | INTRAMUSCULAR | Status: AC | PRN
  Administered 2011-04-16: 80 mL via INTRAVENOUS

## 2011-05-02 ENCOUNTER — Ambulatory Visit (INDEPENDENT_AMBULATORY_CARE_PROVIDER_SITE_OTHER): Payer: Medicare Other | Admitting: Pulmonary Disease

## 2011-05-02 ENCOUNTER — Encounter: Payer: Self-pay | Admitting: Pulmonary Disease

## 2011-05-02 VITALS — BP 138/76 | HR 97 | Temp 98.0°F | Ht 72.0 in | Wt 259.2 lb

## 2011-05-02 DIAGNOSIS — R0602 Shortness of breath: Secondary | ICD-10-CM

## 2011-05-02 DIAGNOSIS — R0609 Other forms of dyspnea: Secondary | ICD-10-CM

## 2011-05-02 DIAGNOSIS — R0600 Dyspnea, unspecified: Secondary | ICD-10-CM | POA: Insufficient documentation

## 2011-05-02 NOTE — Assessment & Plan Note (Signed)
Unclear cause. Inspite of his remote h/o smoking, spirometry doe snot show significant airway obstruction. CT scan does not show emphysema or ILD- his nodules are stable & probably benign. I cannot definitely attribute his symptoms to allergies, sinus disease or GERD. His mild hypoxia is puzzling. Cardiac reports do not mention pulmonary hypertension. The only pulmomnary etiology left for his chronic dyspnea is chronic PE - since he just had a CT scan done, we will proceed with a VQ scan. He prefers to get this done in [place].  I have also asked him to get his saturation checked by his daughter who is a first responder & report back to me. I do not see any meds on his list that could be causing pulmonary toxicity.

## 2011-05-02 NOTE — Patient Instructions (Signed)
Call me back after your daughter checks your oxygen levels Lung scan for blood clots

## 2011-05-02 NOTE — Progress Notes (Signed)
  Subjective:    Patient ID: Arthur Lutz, male    DOB: 1941/07/25, 70 y.o.   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 He required a cardiac stent 1.5 yrs ago but this never improved his dyspnea He reprots DOE - walking up a hill 300 ft. He reports a sensation of fullness at the base of his throat. He denies cough or wheezing or seasonal variation. He has rt pedal edema. Cardiolite study showed EF 47% & was negative for ischemia. He was unable to walk on the treadmill due to knee issues. A cardiac cath was suggested but he is hesitant. He had rt knee replacement & lt knee arthroscopy earlier this year. His room air saturation was 91% today & on walking, this improved to 93%. Spirometry showed mild restriction with FEV1 79%, FVC 78% & ratio 78. He had a melanoma in '06 & has a CT scan every year. Last such CT in 9/12 showed unchanged BL  Tiny < 4mm nodules , considered benign given stabilty over 6 yrs. He reports sinus drainage but denies heartburn.  He has not undergone a sleep study because he does not think he would tolerate cpap  Past Medical History  Diagnosis Date  . Hypertension   . Heart attack   . Diabetes mellitus   . Cancer   . Allergic rhinitis   . Sleep apnea   . CAD (coronary artery disease)     Past Surgical History  Procedure Date  . Total knee arthroplasty     right    Review of Systems  Constitutional: Negative for fever, appetite change and unexpected weight change.  HENT: Negative for ear pain, congestion, sore throat, rhinorrhea, sneezing, trouble swallowing, dental problem and postnasal drip.   Eyes: Negative for redness.  Respiratory: Positive for shortness of breath. Negative for cough and wheezing.   Cardiovascular: Negative for chest pain, palpitations and leg swelling.  Gastrointestinal: Negative for nausea, vomiting, abdominal pain and diarrhea.  Genitourinary: Negative  for dysuria and urgency.  Musculoskeletal: Negative for joint swelling.  Skin: Negative for rash.  Neurological: Negative for syncope and headaches.  Hematological: Does not bruise/bleed easily.  Psychiatric/Behavioral: Negative for dysphoric mood. The patient is not nervous/anxious.        Objective:   Physical Exam  Gen. Pleasant, obese, in no distress, normal affect ENT - no lesions, no post nasal drip, class 2-3 airway Neck: No JVD, no thyromegaly, no carotid bruits Lungs: no use of accessory muscles, no dullness to percussion, decreased without rales or rhonchi  Cardiovascular: Rhythm regular, heart sounds  normal, no murmurs or gallops, 1+ peripheral edema Abdomen: soft and non-tender, no hepatosplenomegaly, BS normal. Musculoskeletal: No deformities, no cyanosis or clubbing Neuro:  alert, non focal, no tremors       Assessment & Plan:

## 2011-05-03 ENCOUNTER — Ambulatory Visit (HOSPITAL_COMMUNITY)
Admission: RE | Admit: 2011-05-03 | Discharge: 2011-05-03 | Disposition: A | Discharge status: Home / Self Care | Payer: Medicare Other | Source: Ambulatory Visit | Attending: Pulmonary Disease | Admitting: Pulmonary Disease

## 2011-05-03 ENCOUNTER — Encounter (HOSPITAL_COMMUNITY): Payer: Self-pay

## 2011-05-03 DIAGNOSIS — R0602 Shortness of breath: Secondary | ICD-10-CM

## 2011-05-03 DIAGNOSIS — R0989 Other specified symptoms and signs involving the circulatory and respiratory systems: Secondary | ICD-10-CM | POA: Insufficient documentation

## 2011-05-03 DIAGNOSIS — R0609 Other forms of dyspnea: Secondary | ICD-10-CM | POA: Insufficient documentation

## 2011-05-03 DIAGNOSIS — R0600 Dyspnea, unspecified: Secondary | ICD-10-CM

## 2011-05-03 MED ORDER — TECHNETIUM TO 99M ALBUMIN AGGREGATED
6.0000 | Freq: Once | INTRAVENOUS | Status: AC | PRN
  Administered 2011-05-03: 4.9 via INTRAVENOUS

## 2011-05-03 MED ORDER — XENON XE 133 GAS
10.0000 | GAS_FOR_INHALATION | Freq: Once | RESPIRATORY_TRACT | Status: AC | PRN
  Administered 2011-05-03: 15.2 via RESPIRATORY_TRACT

## 2011-05-07 NOTE — Progress Notes (Signed)
No further testing needed from pulmonary standpt

## 2011-06-11 ENCOUNTER — Other Ambulatory Visit: Payer: Self-pay | Admitting: Orthopedic Surgery

## 2011-07-05 ENCOUNTER — Encounter (HOSPITAL_COMMUNITY): Payer: Medicare Other | Attending: Oncology

## 2011-07-05 DIAGNOSIS — Z95828 Presence of other vascular implants and grafts: Secondary | ICD-10-CM

## 2011-07-05 DIAGNOSIS — Z452 Encounter for adjustment and management of vascular access device: Secondary | ICD-10-CM | POA: Insufficient documentation

## 2011-07-05 MED ORDER — SODIUM CHLORIDE 0.9 % IJ SOLN
10.0000 mL | INTRAMUSCULAR | Status: DC | PRN
  Administered 2011-07-05: 10 mL via INTRAVENOUS

## 2011-07-05 MED ORDER — HEPARIN SOD (PORK) LOCK FLUSH 100 UNIT/ML IV SOLN
500.0000 [IU] | Freq: Once | INTRAVENOUS | Status: AC
  Administered 2011-07-05: 500 [IU] via INTRAVENOUS

## 2011-07-05 NOTE — Progress Notes (Signed)
Arthur Lutz presented for Portacath access and flush. Proper placement of portacath confirmed by CXR. Portacath located left chest wall accessed with  H 20 needle. Good blood return present. Portacath flushed with 20ml NS and 500U/5ml Heparin and needle removed intact. Procedure without incident. Patient tolerated procedure well.   

## 2011-07-09 ENCOUNTER — Ambulatory Visit (HOSPITAL_COMMUNITY): Payer: Medicare Other

## 2011-08-02 ENCOUNTER — Other Ambulatory Visit: Payer: Self-pay | Admitting: Cardiology

## 2011-08-02 ENCOUNTER — Encounter (HOSPITAL_COMMUNITY): Payer: Self-pay | Admitting: Pharmacy Technician

## 2011-08-06 MED ORDER — DEXAMETHASONE SODIUM PHOSPHATE 10 MG/ML IJ SOLN
10.0000 mg | Freq: Once | INTRAMUSCULAR | Status: DC
  Filled 2011-08-06: qty 1

## 2011-08-07 ENCOUNTER — Other Ambulatory Visit: Payer: Self-pay

## 2011-08-07 ENCOUNTER — Encounter (HOSPITAL_COMMUNITY): Payer: Self-pay | Admitting: Cardiology

## 2011-08-07 ENCOUNTER — Ambulatory Visit (HOSPITAL_COMMUNITY)
Admission: RE | Admit: 2011-08-07 | Discharge: 2011-08-08 | Disposition: A | Discharge status: Home / Self Care | Payer: Medicare Other | Source: Ambulatory Visit | Attending: Cardiology | Admitting: Cardiology

## 2011-08-07 ENCOUNTER — Encounter (HOSPITAL_COMMUNITY)
Admission: RE | Disposition: A | Discharge status: Home / Self Care | Payer: Self-pay | Source: Ambulatory Visit | Attending: Cardiology

## 2011-08-07 DIAGNOSIS — M1712 Unilateral primary osteoarthritis, left knee: Secondary | ICD-10-CM | POA: Diagnosis present

## 2011-08-07 DIAGNOSIS — E118 Type 2 diabetes mellitus with unspecified complications: Secondary | ICD-10-CM | POA: Diagnosis present

## 2011-08-07 DIAGNOSIS — I10 Essential (primary) hypertension: Secondary | ICD-10-CM | POA: Diagnosis present

## 2011-08-07 DIAGNOSIS — I2510 Atherosclerotic heart disease of native coronary artery without angina pectoris: Secondary | ICD-10-CM | POA: Diagnosis present

## 2011-08-07 DIAGNOSIS — M1710 Unilateral primary osteoarthritis, unspecified knee: Secondary | ICD-10-CM | POA: Insufficient documentation

## 2011-08-07 DIAGNOSIS — Z9861 Coronary angioplasty status: Secondary | ICD-10-CM

## 2011-08-07 DIAGNOSIS — Z789 Other specified health status: Secondary | ICD-10-CM | POA: Insufficient documentation

## 2011-08-07 DIAGNOSIS — R0609 Other forms of dyspnea: Secondary | ICD-10-CM | POA: Diagnosis present

## 2011-08-07 DIAGNOSIS — R0989 Other specified symptoms and signs involving the circulatory and respiratory systems: Secondary | ICD-10-CM | POA: Insufficient documentation

## 2011-08-07 DIAGNOSIS — G720 Drug-induced myopathy: Secondary | ICD-10-CM | POA: Insufficient documentation

## 2011-08-07 DIAGNOSIS — I200 Unstable angina: Secondary | ICD-10-CM | POA: Insufficient documentation

## 2011-08-07 DIAGNOSIS — R0600 Dyspnea, unspecified: Secondary | ICD-10-CM | POA: Diagnosis present

## 2011-08-07 DIAGNOSIS — E785 Hyperlipidemia, unspecified: Secondary | ICD-10-CM | POA: Insufficient documentation

## 2011-08-07 DIAGNOSIS — E119 Type 2 diabetes mellitus without complications: Secondary | ICD-10-CM | POA: Insufficient documentation

## 2011-08-07 DIAGNOSIS — E1169 Type 2 diabetes mellitus with other specified complication: Secondary | ICD-10-CM | POA: Diagnosis present

## 2011-08-07 HISTORY — DX: Coronary angioplasty status: Z98.61

## 2011-08-07 HISTORY — DX: Other specified health status: Z78.9

## 2011-08-07 HISTORY — PX: PERCUTANEOUS CORONARY STENT INTERVENTION (PCI-S): SHX5485

## 2011-08-07 HISTORY — DX: Unilateral primary osteoarthritis, left knee: M17.12

## 2011-08-07 HISTORY — PX: LEFT HEART CATHETERIZATION WITH CORONARY ANGIOGRAM: SHX5451

## 2011-08-07 HISTORY — DX: Cerebral infarction, unspecified: I63.9

## 2011-08-07 HISTORY — DX: Malignant neoplasm of bladder, unspecified: C67.9

## 2011-08-07 HISTORY — DX: Atherosclerotic heart disease of native coronary artery without angina pectoris: I25.10

## 2011-08-07 HISTORY — DX: Malignant melanoma of skin, unspecified: C43.9

## 2011-08-07 HISTORY — PX: CORONARY ANGIOPLASTY WITH STENT PLACEMENT: SHX49

## 2011-08-07 LAB — GLUCOSE, CAPILLARY
Glucose-Capillary: 97 mg/dL (ref 70–99)
Glucose-Capillary: 109 mg/dL — ABNORMAL HIGH (ref 70–99)

## 2011-08-07 LAB — POCT ACTIVATED CLOTTING TIME: Activated Clotting Time: 468 seconds

## 2011-08-07 LAB — PLATELET INHIBITION P2Y12: Platelet Function  P2Y12: 158 [PRU] — ABNORMAL LOW (ref 194–418)

## 2011-08-07 SURGERY — LEFT HEART CATHETERIZATION WITH CORONARY ANGIOGRAM
Anesthesia: LOCAL

## 2011-08-07 MED ORDER — DIPHENHYDRAMINE HCL 50 MG/ML IJ SOLN
INTRAMUSCULAR | Status: AC
  Filled 2011-08-07: qty 1

## 2011-08-07 MED ORDER — MORPHINE SULFATE 2 MG/ML IJ SOLN: 2.0000 mg | INTRAMUSCULAR | Status: DC | PRN

## 2011-08-07 MED ORDER — ISOSORBIDE MONONITRATE ER 30 MG PO TB24
30.0000 mg | ORAL_TABLET | Freq: Every day | ORAL | Status: DC
  Administered 2011-08-08: 30 mg via ORAL
  Filled 2011-08-07: qty 1

## 2011-08-07 MED ORDER — BIVALIRUDIN 250 MG IV SOLR
INTRAVENOUS | Status: AC
  Filled 2011-08-07: qty 250

## 2011-08-07 MED ORDER — DIAZEPAM 5 MG PO TABS
5.0000 mg | ORAL_TABLET | ORAL | Status: AC
  Administered 2011-08-07: 5 mg via ORAL
  Filled 2011-08-07: qty 1

## 2011-08-07 MED ORDER — LOSARTAN POTASSIUM-HCTZ 50-12.5 MG PO TABS: 1.0000 | ORAL_TABLET | Freq: Every day | ORAL | Status: DC

## 2011-08-07 MED ORDER — SODIUM CHLORIDE 0.9 % IJ SOLN: 3.0000 mL | INTRAMUSCULAR | Status: DC | PRN

## 2011-08-07 MED ORDER — ASPIRIN 81 MG PO CHEW
81.0000 mg | CHEWABLE_TABLET | Freq: Every day | ORAL | Status: DC
  Administered 2011-08-08: 81 mg via ORAL
  Filled 2011-08-07: qty 1

## 2011-08-07 MED ORDER — LOSARTAN POTASSIUM 50 MG PO TABS
50.0000 mg | ORAL_TABLET | Freq: Every day | ORAL | Status: DC
  Administered 2011-08-07: 50 mg via ORAL
  Filled 2011-08-07 (×2): qty 1

## 2011-08-07 MED ORDER — FENTANYL CITRATE 0.05 MG/ML IJ SOLN
INTRAMUSCULAR | Status: AC
  Filled 2011-08-07: qty 2

## 2011-08-07 MED ORDER — MIDAZOLAM HCL 2 MG/2ML IJ SOLN
INTRAMUSCULAR | Status: AC
  Filled 2011-08-07: qty 2

## 2011-08-07 MED ORDER — LIDOCAINE HCL (PF) 1 % IJ SOLN
INTRAMUSCULAR | Status: AC
  Filled 2011-08-07: qty 30

## 2011-08-07 MED ORDER — ONDANSETRON HCL 4 MG/2ML IJ SOLN: 4.0000 mg | Freq: Four times a day (QID) | INTRAMUSCULAR | Status: DC | PRN

## 2011-08-07 MED ORDER — VERAPAMIL HCL 2.5 MG/ML IV SOLN
INTRAVENOUS | Status: AC
  Filled 2011-08-07: qty 2

## 2011-08-07 MED ORDER — CLOPIDOGREL BISULFATE 75 MG PO TABS
75.0000 mg | ORAL_TABLET | Freq: Every day | ORAL | Status: DC
  Administered 2011-08-08: 75 mg via ORAL
  Filled 2011-08-07: qty 1

## 2011-08-07 MED ORDER — ACETAMINOPHEN 325 MG PO TABS
650.0000 mg | ORAL_TABLET | ORAL | Status: DC | PRN
  Administered 2011-08-07: 650 mg via ORAL
  Filled 2011-08-07: qty 2

## 2011-08-07 MED ORDER — HYDRALAZINE HCL 20 MG/ML IJ SOLN
INTRAMUSCULAR | Status: AC
  Filled 2011-08-07: qty 1

## 2011-08-07 MED ORDER — HYDROCHLOROTHIAZIDE 12.5 MG PO CAPS
12.5000 mg | ORAL_CAPSULE | Freq: Every day | ORAL | Status: DC
  Filled 2011-08-07 (×2): qty 1

## 2011-08-07 MED ORDER — SODIUM CHLORIDE 0.9 % IV SOLN
1.0000 mL/kg/h | INTRAVENOUS | Status: AC
  Administered 2011-08-07: 1 mL/kg/h via INTRAVENOUS

## 2011-08-07 MED ORDER — METOPROLOL SUCCINATE 12.5 MG HALF TABLET
12.5000 mg | ORAL_TABLET | Freq: Every day | ORAL | Status: DC
  Administered 2011-08-07: 12.5 mg via ORAL
  Filled 2011-08-07 (×2): qty 1

## 2011-08-07 MED ORDER — NITROGLYCERIN IN D5W 200-5 MCG/ML-% IV SOLN
2.0000 ug/min | INTRAVENOUS | Status: DC
  Administered 2011-08-07: 10 ug/min via INTRAVENOUS

## 2011-08-07 MED ORDER — SODIUM CHLORIDE 0.9 % IV SOLN: 250.0000 mL | INTRAVENOUS | Status: DC | PRN

## 2011-08-07 MED ORDER — NITROGLYCERIN 0.2 MG/ML ON CALL CATH LAB
INTRAVENOUS | Status: AC
  Filled 2011-08-07: qty 1

## 2011-08-07 MED ORDER — SODIUM CHLORIDE 0.9 % IV SOLN
INTRAVENOUS | Status: DC
  Administered 2011-08-07: 07:00:00 via INTRAVENOUS

## 2011-08-07 MED ORDER — TRIAZOLAM 0.25 MG PO TABS
0.5000 mg | ORAL_TABLET | Freq: Every evening | ORAL | Status: DC | PRN
  Administered 2011-08-08: 0.5 mg via ORAL
  Filled 2011-08-07 (×2): qty 2

## 2011-08-07 MED ORDER — ZOLPIDEM TARTRATE 5 MG PO TABS: 5.0000 mg | ORAL_TABLET | Freq: Every evening | ORAL | Status: DC | PRN

## 2011-08-07 MED ORDER — NITROGLYCERIN IN D5W 200-5 MCG/ML-% IV SOLN
INTRAVENOUS | Status: AC
  Filled 2011-08-07: qty 250

## 2011-08-07 MED ORDER — SODIUM CHLORIDE 0.9 % IJ SOLN
3.0000 mL | Freq: Two times a day (BID) | INTRAMUSCULAR | Status: DC
  Administered 2011-08-07: 3 mL via INTRAVENOUS

## 2011-08-07 MED ORDER — AMLODIPINE BESYLATE 5 MG PO TABS
5.0000 mg | ORAL_TABLET | Freq: Every day | ORAL | Status: DC
  Administered 2011-08-08: 5 mg via ORAL
  Filled 2011-08-07 (×2): qty 1

## 2011-08-07 MED ORDER — HEPARIN (PORCINE) IN NACL 2-0.9 UNIT/ML-% IJ SOLN
INTRAMUSCULAR | Status: AC
  Filled 2011-08-07: qty 2000

## 2011-08-07 MED ORDER — ASPIRIN 81 MG PO TABS: 81.0000 mg | ORAL_TABLET | Freq: Every day | ORAL | Status: DC

## 2011-08-07 NOTE — H&P (Signed)
  History and Physical Interval Note:  NAME:  Arthur Lutz   MRN: [id number] DOB:  Aug 28, 1940   ADMIT DATE: 08/07/2011   08/07/2011 8:44 AM  Arthur Lutz is a 71 y.o. male with a PMH of CAD (PCI to RCA - 3.5 x 15 Driver stent & cutting balloon PTA of ostial Diagonal in 5/200 and 5/09).  He continues to have exertional throat discomfort and will just 2 to 3 minutes.  He had a nuclear study done on August 2004 which showed no evidence ischemia but he continued to have this anginal type discomfort.  He is currently undergoing evaluation for left knee surgery by Dr. Despina Lutz  planned for February 2013.   is part of his preoperative evaluation he is being referred for diagnostic heart catheterization with a diagnosis of exertional angina.  His patient Dr. Julieanne Lutz who seen on January 4th, 2013.  His had no interval change since his clinic visit.  The various methods of treatment have been discussed with the patient and family. After consideration of risks, benefits and other options for treatment, the patient has consented to Procedure(s):  LEFT HEART CATHETERIZATION AND CORONARY ANGIOGRAPHY +/- AD HOC PERCUTANEOUS CORONARY INTERVENTION as a surgical intervention.   The patients' history has been reviewed, patient examined, no change in status from most recent note, stable for surgery. I have reviewed the patients' chart and labs. Questions were answered to the patient's satisfaction.     BP 161/84  Pulse 59  Temp(Src) 98.3 F (36.8 C) (Oral)  Resp 18  Ht 6' (1.829 m)  Wt 113.399 kg (250 lb)  BMI 33.91 kg/m2  SpO2 94% General appearance: alert, cooperative, appears stated age and no distress Neck: no adenopathy, no carotid bruit, no JVD, supple, symmetrical, trachea midline and thyroid not enlarged, symmetric, no tenderness/mass/nodules Lungs: clear to auscultation bilaterally and non-labored Heart: regular rate and rhythm, S1, S2 normal, no murmur, click, rub or gallop Abdomen: soft,  non-tender; bowel sounds normal; no masses,  no organomegaly and obese Extremities: extremities normal, atraumatic, no cyanosis or edema Pulses: 2+ and symmetric Neurologic: Alert and oriented X 3, normal strength and tone. Normal symmetric reflexes. Normal coordination and gait; CN II-XII grossly intact   PLAN: Proceed with cardiac catheterization - WE WILL PLAN RIGHT RADIAL ACCESS.  The procedure with Risks/Benefits/Alternatives and Indications was reviewed with the patient and family.  All questions were answered.    Risks / Complications include, but not limited to: Death, MI, CVA/TIA, VF/VT (with defibrillation), Bradycardia (need for temporary pacer placement), contrast induced nephropathy, bleeding / bruising / hematoma / pseudoaneurysm, vascular or coronary injury (with possible emergent CT or Vascular Surgery), adverse medication reactions, infection.    The patient  and family) voice understanding and agree to proceed.   I have signed the consent form and placed it on the chart for patient signature and RN witness.     Arthur Lutz, M.D., M.S. [facility] [address] [place], [place]  [address]  [phone]  08/07/2011 8:51 AM      Lutz,Arthur W [facility] [address] [place], [place]  [address]  [phone]  08/07/2011 8:44 AM

## 2011-08-07 NOTE — Op Note (Signed)
[facility]     CARDIAC CATHETERIZATION REPORT  NAME: Arthur Lutz  MRN: [id number] DOB: 09-06-1940   ADMIT DATE: 08/07/2011  Performing Cardiologist: Marykay Lex  Primary Physician: Kirk Ruths, MD Primary Cardiologist:  Julieanne Manson, M.D.  Procedures Performed:  Left Heart Catheterization via 5 Fr Right Radial Artery Access access  Left Ventriculography, (RAO) 11 ml/sec for 33 ml total contrast  Native Coronary Angiography  IC NTG Injection x 4   Percutaneous Coronary Artery Intervention on Proximal Left Circumflex with a 3.5 mm x 12 mm Vision BMS; final diameter 4.2 mm  Percutaneous Coronary Artery Intervention on Ostial and proximal D2 with Cutting Balloon Atherectomy / PTCA using a Flextome 2.25 mm x 10 mm balloon  Indication(s): Worsening exertional throat tightness -- consistent with Unstable Angina  Known CAD - s/p PCI to RCA & PTCA of Ostial D2  Pre-operative evaluation  History: [name] 71 y.o. male patient of Dr. Caprice Kluver with a PMH of CAD (PCI to RCA - 3.5 x 15 Driver stent & cutting balloon PTA of ostial Diagonal in 5/200 and 5/09). He continues to have exertional throat discomfort and will just 2 to 3 minutes. He had a nuclear study done on August 2004 which showed no evidence ischemia but he continued to have this anginal type discomfort. He is currently undergoing evaluation for left knee surgery by Dr. Despina Hick planned for February 2013. is part of his preoperative evaluation he is being referred for diagnostic heart catheterization with a diagnosis of exertional angina.   Consent: The procedure with Risks/Benefits/Alternatives and Indications was reviewed with the patient (and family).  All questions were answered.    Risks / Complications include, but not limited to: Death, MI, CVA/TIA, VF/VT (with defibrillation), Bradycardia (need for temporary pacer placement), contrast induced nephropathy, bleeding / bruising /  hematoma / pseudoaneurysm, vascular or coronary injury (with possible emergent CT or Vascular Surgery), adverse medication reactions, infection.    The patient (and family) voice understanding and agree to proceed.   Consent for signed by MD and patient with RN witness -- placed on chart.  Procedure: The patient was brought to the 2nd Floor [facility] in the fasting state and prepped and draped in the usual sterile fashion for Right groin or radial access.   A modified Allen's test with plethysmography was performed on the right wrist demonstrating adequate Ulnar Artery collateral flow.    Sterile technique was used including antiseptics, cap, gloves, gown, hand hygiene, mask and sheet.  Skin prep: Chlorhexidine;  Time Out: Verified patient identification, verified procedure, site/side was marked, verified correct patient position, special equipment/implants available, medications/allergies/relevent history reviewed, required imaging and test results available.  Performed  The right wrist was anesthetized with 1% subcutaneous Lidocaine.  The right radial artery was accessed using the Seldinger Technique with placement of a 5 Fr Glide Sheath. The sheath was aspirated and flushed.  Then a total of 10 ml of standard Radial Artery Cocktail (see medications) was infused.  Radial Cocktail: 5 mg Verapamil, 400 mcg NTG, 2 ml 2% Lidocaine  A 5 Fr TIG 4.0 Catheter was advanced of over a Long Exchange Safety J wire into the ascending Aorta.  The catheter was used to engage the Left and Right Coronary Arteries.  Multiple cineangiographic views of both the Left and Right Coronary Artery system(s) were performed.   This catheter was then exchanged for a 5 Fr angled Pigtail that was advanced across  the Aortic Valve over a wire.  LV hemodynamics were measured and Left Ventriculography was performed.  LV hemodynamics were then re-sampled, and the catheter was pulled back across the Aortic  Valve for measurement of "pull-back" gradient.  The catheter was removed completely out of the body over the [employer] J wire.  Hemodynamics:  Central Aortic Pressure: 148/73 mmHg; 103 mmHg  LV Pressure / LV End diastolic Pressure: 149/9 mmHg, EDP 16 mmHg  Coronary Angiographic Data:   Dominance: Right  Left Main:  Large Caliber, bifurcates normally, angiographically normal  Left Anterior Descending (LAD):  Large  Tapering to moderate caliber vessel that reaches to & around the Apex.    1st Diagonal (D1):  Small caliber, minimal luminal irregularities  2nd Diagonal Major (D2):  Moderate caliber (2.25-2.5 mm) Ostial 95% stenosis followed by a proximal focal ~50-60% lesion At a branch point  3rd diagonal (D3):  small-moderate with minimal luminal irregularities  Circumflex (LCx):  Large Caliber vessel, proximal 90% stenosis just after small OM1.  The vessel gives off Large OM2, then shortly after bifurcates to modeare AVG Circ & OM3 - minimal luminal irreguarities  1st obtuse marginal:   Small caliber, minimal luminal irregularities  2nd obtuse marginal:  Moderate to large caliber, minimal luminal irregularities  3rd obtuse marginal:  Moderate caliber, minimal luminal irregularities   Atrio-ventricular Groove (AVG) Cx: Moderate caliber, minimal luminal irregularities   Right Coronary Artery: Large (Dominant) patent stent, otherwise diffuse luminal irregularities  Right Posterior Descending Artery (RPDA):  Moderate caliber vessel, mid neck and a 40% lesion  Right Atrio-ventricular Groove Branch (RPAVB):  Moderate caliber, minimal luminal irregularities;   Several posterior lateral branches with no severe stenoses, diffuse luminal irregularities  PERCUTANEOUS CORONARY INTERVENTION PROCEDURE  After reviewing the initial cineangiography images, the culprit lesion(s) were identified -- partial left circumflex and ostial diagonal, and the decision was made to proceed with  percutaneous coronary intervention.  The 5Fr Sheath was exchanged over a wire for a 6Fr sheath.   A weight based bolus of IV Angiomax was administered and the drip was continued until completion of the procedure, with the plan to continue until currently complete.   The patient is a standing clopidogrel that was administered along with 325 mg of aspirin pre-cath.  The Guide catheter(s) were advanced over a J-wire and used to engage the left Coronary Artery.Marland Kitchen An ACT of > 200 Sec was confirmed prior to advancing the Guidewire.  Lesion #1:  Proximal Left Circumflex  Pre-PCI Stenosis: 90 % Post-PCI Stenosis: 0 %     TIMI 3 flow       TIMI 3 flow  Guide Catheter: 6 French XB 3.5   Guidewire: BMW  Pre-Dilitation Balloon: Empira 2.5 mm x 10 mm   1st Inflation:    10 Atm for 34 Sec  Scout angiography did not reveal evidence of dissection or perforation. STENT: Veriflex BMS 3.5 mm x 12 mm   1st Inflation:    12 Atm for 30 Sec   2nd Inflation:  18 Atm for 45 Sec; final proximal diameter 4.1 mm  Post-Dilitation Balloon: [place]   1st Inflation:  14 Atm for 50 Sec; final distal diameter 4.26 mm  Postponement angiography revealed excellent stent apposition, and did not reveal evidence of dissection or perforation.  Lesion # 2:  Ostial D2 (with proximal 50% lesion as well)  Pre-PCI Stenosis: 95 % Post-PCI Stenosis: 20 %     TIMI 3 flow  TIMI 3 flow  Guide Catheter: 6 French XB 3.5   Guidewire: BMW  Pre-Dilitation Balloon: [place] Trek 1.5 mm x 12 mm   1st Inflation:  8 Atm for 31 Sec Cutting Balloon #1: Flextome Cutting Balloon 2.0 mm 10 mm   1st Inflation:  4 Atm for 30 Sec   2nd Inflation: 8 Atm for 45 Sec   3rd Inflation: 10 Atm for 60 Sec  Cutting Balloon #2 Flextome Cutting Balloon 2.25 mm x 10 mm   1st Inflation:  5 Atm for 60 Sec   2nd Inflation: 6 Atm for 120 Sec   3rd Inflation: 5 Atm for 120 Sec; at proximal 50% lesion; stenosis 10% 4nd Inflation: 8 Atm for 60 Sec; ostial  lesion 5nd Inflation: 6 Atm for 150 Sec; ostial - final stenosis 20% Intracoronary Nitroglycerin 200 mcg given prior to first Cutting Balloon inflation and following each final inflation of the balloon (total of 4 injections)  Post angioplasty imaging (with without wire) revealed twice a residuals to stenosis in the ostial lesion and 10-20% stenosis in the proximal lesion in the diagonal is felt to be adequate. There was TIMI-3 flow preserved in the diagonal as well as the LAD but no dissection noted in the LAD. There is no first perforation or dissection noted on the vessel.  After the guidewire was removed completely of the body and final angiography was performed, the guide cath was disengaged from the coronary artery and removed out of the body over wire without difficulty.  The sheath was removed in the Cath Lab with a TR band placed at 14 ml Air at 1056 hours (time).  Reverse Allen's test with plethysmography revealed non-occlusive hemostasis.  The patient was transported to the PACU in hemodynamic stable, pain-free condition.   The patient  was stable before, during and following the procedure.   Patient did tolerate procedure well. There were not complications.  EBL: <10 mL  Medications:  Premedication: 5 mg  Valium  Sedation:  3 mg IV Versed, 100 IV mcg Fentanyl, 25 mg  IV Benadryl,  Contrast:  325 ml Omnipaque  Radial Cocktail: 5 mg Verapamil, 400 mcg NTG, 2 ml 2% Lidocaine  IV Heparin: 5500 units at sheath placement  Angiomax bolus and infusion (A standard weight based rate) administered with effusion continued post procedure until back complete  Impression: 1.  Severe two-vessel disease involving the proximal Circumflex and ostial D2, status post successful bare-metal stent placement to the Circumflex and Cutting Balloon atherectomy/PTCA of the ostial  Diagonal2. 2.  Widely patent RCA stent with mild to moderate RPDA disease. 3.  Normal ejection fraction of 66 5% no wall motion  abnormalities.  Plan: 1.   Admit for overnight observation post PCI.  Continue Angiomax infusion at current rate until back complete. 2.   Dual antiplatelet therapy with Aspirin / Plavix for a minimum of 30 days, will be discussed with surgeons artery perioperative management of Plavix. 3.   Anticipate discharge in the morning if stable will follow Dr. Clarene Duke  The case and results was discussed with the patient (and family). The case and results was not discussed with the patient's PCP. The case and results was discussed with the patient's Cardiologist.  Time Spend Directly with Patient:   One hour and 30 minutes  HARDING,DAVID W, M.D., M.S. [facility] [address] [place], [place]  [address]  [phone]  08/07/2011 5:29 PM

## 2011-08-07 NOTE — Brief Op Note (Signed)
08/07/2011  11:10 AM  PATIENT:  Arthur Lutz  71 y.o. male patient of Dr. Caprice Kluver with a PMH of CAD (PCI to RCA - 3.5 x 15 Driver stent & cutting balloon PTA of ostial Diagonal in 5/200 and 5/09). He continues to have exertional throat discomfort and will just 2 to 3 minutes. He had a nuclear study done on August 2004 which showed no evidence ischemia but he continued to have this anginal type discomfort. He is currently undergoing evaluation for left knee surgery by Dr. Despina Hick planned for February 2013. is part of his preoperative evaluation he is being referred for diagnostic heart catheterization with a diagnosis of exertional angina.    PRE-OPERATIVE DIAGNOSIS: Exertional / Unstable Angina  POST-OPERATIVE DIAGNOSIS:    90% prox LCx lesion - s/p PCI with 3.5 mm x 12 mm BMS (post dilated to ~4.63mm)  Small OM1 pre-lesion, then the Cx gives off Large OM2, then shortly after bifurcates to modeare AVG Circ & OM3 - minimal luminal irreguarities  95% ostial Diagonal (D2) -- s/p Cutting Balloon Atherectomy / PTCA only with 2.25 mm Flextome cutting balloon (reduced to ~20%  LAD - large caliber, mild luminal irregularities @ major Diagonal (D2) take-off, reaches apex, D1 small, D3 small-moderate with minimal luminal irregularities  RCA - Large (Dominant) patent stent, RPDA - mid ~40%  LVGram  - EF 60-65%, no WMA  Hemodynamics  AoP: 148/73 mmHg; 103 mmHg  LVP: 149/9 mmHg, EDP 16 mmHg   PROCEDURE:  Procedure(s):  LEFT HEART CATHETERIZATION WITH CORONARY ANGIOGRAM - via 5Fr Right Radial Artery Access (TIG 4.0, Angled Pigtail)  PERCUTANEOUS CORONARY STENT INTERVENTION (PCI-S) - via 6Fr Radial Sheath (XB 3.5, BMW)  Surgeon(s): Marykay Lex  PHYSICIAN ASSISTANT:   ASSISTANTS: Linward Natal, RN  ANESTHESIA:   local and IV sedation ; Benadryl 25mg , Versed 3 mg, Fentanyl 100 mcg  EBL:    < 10 ml  CATHETERS: 5 Fr TIG 4.0, 5Fr Angled Pigtail; Guide - XB 3.5, Wire BMW  LOCAL  MEDICATIONS USED:  LIDOCAINE 2 ml  TR BAND:  14 ml Air; 1056 hrs.  DICTATION: .Note written in EPIC  PLAN OF CARE: Admit for overnight observation  Continue DAP (ASA/Plavix)  Run Angiomax until bag complete  NTG infusion overnight to combat spasm - back to Imdur in AM  Anticipate d/c in AM - f/u with Dr. Caprice Kluver.  PATIENT DISPOSITION:  PACU - hemodynamically stable.   Delay start of Pharmacological VTE agent (>24hrs) due to surgical blood loss or risk of bleeding:  N/A  Marykay Lex, M.D., M.S. [facility] [address] [place], [place]  [address]  [phone]  08/07/2011 11:20 AM

## 2011-08-08 ENCOUNTER — Other Ambulatory Visit: Payer: Self-pay

## 2011-08-08 LAB — BASIC METABOLIC PANEL
Calcium: 9.1 mg/dL (ref 8.4–10.5)
GFR calc Af Amer: >90 mL/min (ref >90)
GFR calc non Af Amer: 85 mL/min — ABNORMAL LOW (ref >90)
Potassium: 3.7 mEq/L (ref 3.5–5.1)
Sodium: 140 mEq/L (ref 135–145)

## 2011-08-08 LAB — CBC
Hemoglobin: 13.4 g/dL (ref 13.0–17.0)
MCH: 31.7 pg (ref 26.0–34.0)
MCHC: 34.8 g/dL (ref 30.0–36.0)
Platelets: 172 10*3/uL (ref 150–400)
RDW: 13 % (ref 11.5–15.5)

## 2011-08-08 MED ORDER — METOPROLOL SUCCINATE ER 25 MG PO TB24: 12.5000 mg | ORAL_TABLET | Freq: Every day | ORAL | Status: DC

## 2011-08-08 MED ORDER — METFORMIN HCL 500 MG PO TABS: 500.0000 mg | ORAL_TABLET | Freq: Every day | ORAL | Status: DC

## 2011-08-08 MED ORDER — LOSARTAN POTASSIUM-HCTZ 100-25 MG PO TABS: 1.0000 | ORAL_TABLET | Freq: Every day | ORAL | Status: DC

## 2011-08-08 MED ORDER — METOPROLOL SUCCINATE ER 50 MG PO TB24
50.0000 mg | ORAL_TABLET | Freq: Every day | ORAL | Status: DC
  Filled 2011-08-08: qty 1

## 2011-08-08 MED ORDER — METOPROLOL SUCCINATE ER 50 MG PO TB24: 50.0000 mg | ORAL_TABLET | Freq: Every day | ORAL | Status: DC

## 2011-08-08 MED FILL — Dextrose Inj 5%: INTRAVENOUS | Qty: 50 | Status: AC

## 2011-08-08 NOTE — Discharge Summary (Signed)
Physician Discharge Summary  Patient ID: Arthur Lutz MRN: [id number] DOB/AGE: 08-07-1940 71 y.o.  Admit date: 08/07/2011 Discharge date: 08/08/2011  Discharge Diagnoses:  Principal Problem:  *Exertional angina Active Problems:  CAD (coronary artery disease), NEW 08/07/11 cutting balloon PTCA to 95% ostial diag. and 90%  proximal LCX.  prior cutting balloon to RCA  & ostium of ostial diag., 2009   HTN (hypertension)  DOE (dyspnea on exertion)  Diabetes mellitus  Hyperlipemia, intolerant to statins.  Arthritis of knee, left, needs total Knee in future with Dr. Lequita Halt    Discharged Condition: stable  Hospital Course:  Arthur Lutz is a 71 y.o. male with a PMH of CAD (PCI to RCA - 3.5 x 15 Driver stent & cutting balloon PTA of ostial Diagonal in 5/200 and 5/09). He continues to have exertional throat discomfort.  He had a nuclear study done on August 2004 which showed no evidence ischemia but he continued to have this anginal type discomfort. He is currently undergoing evaluation for left knee surgery by Dr. Despina Hick planned for February 2013.  LHC was part of his preoperative evaluation.  He was  referred for diagnostic heart catheterization with a diagnosis of exertional angina. His patient Dr. Julieanne Manson who seen on January 4th, 2013.   The patient received a BMS stent to the proximal circumflex and ostial D2 via radial approach.  No complications noted.  BP is elevated in the 160-170s SBP.  Toprol XL with be increased to 50mg  daily. He will be discharge home in stable condition.  He has been seen by Dr. Tresa Endo prior to discharge  Significant Diagnostic Studies:  Procedures Performed:  Left Heart Catheterization via 5 Fr Right Radial Artery Access access  Left Ventriculography, (RAO) 11 ml/sec for 33 ml total contrast  Native Coronary Angiography  IC NTG Injection x 4  Percutaneous Coronary Artery Intervention on Proximal Left Circumflex with a 3.5 mm x 12 mm Vision BMS; final  diameter 4.2 mm  Percutaneous Coronary Artery Intervention on Ostial and proximal D2 with Cutting Balloon Atherectomy / PTCA using a Flextome 2.25 mm x 10 mm balloon Indication(s): Worsening exertional throat tightness -- consistent with Unstable Angina  Known CAD - s/p PCI to RCA & PTCA of Ostial D2  Pre-operative evaluation History: Arthur Lutz 71 y.o. male patient of Dr. Caprice Kluver with a PMH of CAD (PCI to RCA - 3.5 x 15 Driver stent & cutting balloon PTA of ostial Diagonal in 5/200 and 5/09). He continues to have exertional throat discomfort and will just 2 to 3 minutes. He had a nuclear study done on August 2004 which showed no evidence ischemia but he continued to have this anginal type discomfort. He is currently undergoing evaluation for left knee surgery by Dr. Despina Hick planned for February 2013. is part of his preoperative evaluation he is being referred for diagnostic heart catheterization with a diagnosis of exertional angina.  Consent: The procedure with Risks/Benefits/Alternatives and Indications was reviewed with the patient (and family). All questions were answered.  Risks / Complications include, but not limited to: Death, MI, CVA/TIA, VF/VT (with defibrillation), Bradycardia (need for temporary pacer placement), contrast induced nephropathy, bleeding / bruising / hematoma / pseudoaneurysm, vascular or coronary injury (with possible emergent CT or Vascular Surgery), adverse medication reactions, infection.  The patient (and family) voice understanding and agree to proceed.  Consent for signed by MD and patient with RN witness -- placed on chart.  Procedure: The patient was brought to the  2nd Floor [facility] in the fasting state and prepped and draped in the usual sterile fashion for Right groin or radial access. A modified Allen's test with plethysmography was performed on the right wrist demonstrating adequate Ulnar Artery collateral flow.  Sterile technique  was used including antiseptics, cap, gloves, gown, hand hygiene, mask and sheet.  Skin prep: Chlorhexidine;  Time Out: Verified patient identification, verified procedure, site/side was marked, verified correct patient position, special equipment/implants available, medications/allergies/relevent history reviewed, required imaging and test results available. Performed  The right wrist was anesthetized with 1% subcutaneous Lidocaine. The right radial artery was accessed using the Seldinger Technique with placement of a 5 Fr Glide Sheath. The sheath was aspirated and flushed. Then a total of 10 ml of standard Radial Artery Cocktail (see medications) was infused. Radial Cocktail: 5 mg Verapamil, 400 mcg NTG, 2 ml 2% Lidocaine  A 5 Fr TIG 4.0 Catheter was advanced of over a Long Exchange Safety J wire into the ascending Aorta. The catheter was used to engage the Left and Right Coronary Arteries. Multiple cineangiographic views of both the Left and Right Coronary Artery system(s) were performed.  This catheter was then exchanged for a 5 Fr angled Pigtail that was advanced across the Aortic Valve over a wire. LV hemodynamics were measured and Left Ventriculography was performed. LV hemodynamics were then re-sampled, and the catheter was pulled back across the Aortic Valve for measurement of "pull-back" gradient. The catheter was removed completely out of the body over the [employer] J wire.  Hemodynamics:  Central Aortic Pressure: 148/73 mmHg; 103 mmHg  LV Pressure / LV End diastolic Pressure: 149/9 mmHg, EDP 16 mmHg  Coronary Angiographic Data:  Dominance: Right  Left Main: Large Caliber, bifurcates normally, angiographically normal  Left Anterior Descending (LAD): Large Tapering to moderate caliber vessel that reaches to & around the Apex.  1st Diagonal (D1): Small caliber, minimal luminal irregularities  2nd Diagonal Major (D2): Moderate caliber (2.25-2.5 mm) Ostial 95% stenosis followed by a  proximal focal ~50-60% lesion At a branch point 3rd diagonal (D3): small-moderate with minimal luminal irregularities Circumflex (LCx): Large Caliber vessel, proximal 90% stenosis just after small OM1. The vessel gives off Large OM2, then shortly after bifurcates to modeare AVG Circ & OM3 - minimal luminal irreguarities 1st obtuse marginal: Small caliber, minimal luminal irregularities 2nd obtuse marginal: Moderate to large caliber, minimal luminal irregularities  3rd obtuse marginal: Moderate caliber, minimal luminal irregularities  Atrio-ventricular Groove (AVG) Cx: Moderate caliber, minimal luminal irregularities  Right Coronary Artery: Large (Dominant) patent stent, otherwise diffuse luminal irregularities Right Posterior Descending Artery (RPDA): Moderate caliber vessel, mid neck and a 40% lesion  Right Atrio-ventricular Groove Branch (RPAVB): Moderate caliber, minimal luminal irregularities;  Several posterior lateral branches with no severe stenoses, diffuse luminal irregularities PERCUTANEOUS CORONARY INTERVENTION PROCEDURE  After reviewing the initial cineangiography images, the culprit lesion(s) were identified -- partial left circumflex and ostial diagonal, and the decision was made to proceed with percutaneous coronary intervention.  The 5Fr Sheath was exchanged over a wire for a 6Fr sheath.  A weight based bolus of IV Angiomax was administered and the drip was continued until completion of the procedure, with the plan to continue until currently complete.  The patient is a standing clopidogrel that was administered along with 325 mg of aspirin pre-cath.  The Guide catheter(s) were advanced over a J-wire and used to engage the left Coronary Artery.Marland Kitchen  An ACT of > 200 Sec was  confirmed prior to advancing the Guidewire.  Lesion #1: Proximal Left Circumflex  Pre-PCI Stenosis: 90 % Post-PCI Stenosis: 0 %  TIMI 3 flow TIMI 3 flow  Guide Catheter: 6 French XB 3.5 Guidewire: BMW    Pre-Dilitation Balloon: Empira 2.5 mm x 10 mm  1st Inflation: 10 Atm for 34 Sec  Scout angiography did not reveal evidence of dissection or perforation.  STENT: Veriflex BMS 3.5 mm x 12 mm  1st Inflation: 12 Atm for 30 Sec  2nd Inflation: 18 Atm for 45 Sec; final proximal diameter 4.1 mm  Post-Dilitation Balloon: Fordyce Trek  1st Inflation: 14 Atm for 50 Sec; final distal diameter 4.26 mm  Postponement angiography revealed excellent stent apposition, and did not reveal evidence of dissection or perforation.  Lesion # 2: Ostial D2 (with proximal 50% lesion as well)  Pre-PCI Stenosis: 95 % Post-PCI Stenosis: 20 %  TIMI 3 flow TIMI 3 flow  Guide Catheter: 6 French XB 3.5 Guidewire: BMW  Pre-Dilitation Balloon: Mini Trek 1.5 mm x 12 mm  1st Inflation: 8 Atm for 31 Sec  Cutting Balloon #1: Flextome Cutting Balloon 2.0 mm 10 mm  1st Inflation: 4 Atm for 30 Sec  2nd Inflation: 8 Atm for 45 Sec  3rd Inflation: 10 Atm for 60 Sec  Cutting Balloon #2 Flextome Cutting Balloon 2.25 mm x 10 mm  1st Inflation: 5 Atm for 60 Sec  2nd Inflation: 6 Atm for 120 Sec  3rd Inflation: 5 Atm for 120 Sec; at proximal 50% lesion; stenosis 10%  4nd Inflation: 8 Atm for 60 Sec; ostial lesion  5nd Inflation: 6 Atm for 150 Sec; ostial - final stenosis 20%  Intracoronary Nitroglycerin 200 mcg given prior to first Cutting Balloon inflation and following each final inflation of the balloon (total of 4 injections)  Post angioplasty imaging (with without wire) revealed twice a residuals to stenosis in the ostial lesion and 10-20% stenosis in the proximal lesion in the diagonal is felt to be adequate. There was TIMI-3 flow preserved in the diagonal as well as the LAD but no dissection noted in the LAD. There is no first perforation or dissection noted on the vessel.  After the guidewire was removed completely of the body and final angiography was performed, the guide cath was disengaged from the coronary artery and removed out  of the body over wire without difficulty.  The sheath was removed in the Cath Lab with a TR band placed at 14 ml Air at 1056 hours (time). Reverse Allen's test with plethysmography revealed non-occlusive hemostasis.  The patient was transported to the PACU in hemodynamic stable, pain-free condition.  The patient was stable before, during and following the procedure.  Patient did tolerate procedure well.  There were not complications.  EBL: <10 mL  Medications:  Premedication: 5 mg Valium  Sedation: 3 mg IV Versed, 100 IV mcg Fentanyl, 25 mg IV Benadryl,  Contrast: 325 ml Omnipaque  Radial Cocktail: 5 mg Verapamil, 400 mcg NTG, 2 ml 2% Lidocaine  IV Heparin: 5500 units at sheath placement  Angiomax bolus and infusion (A standard weight based rate) administered with effusion continued post procedure until back complete  Impression:  1. Severe two-vessel disease involving the proximal Circumflex and ostial D2, status post successful bare-metal stent placement to the Circumflex and Cutting Balloon atherectomy/PTCA of the ostial Diagonal2.  2. Widely patent RCA stent with mild to moderate RPDA disease.  3. Normal ejection fraction of 66 5% no wall motion abnormalities.  Plan:  1. Admit for overnight observation post PCI. Continue Angiomax infusion at current rate until back complete.  2. Dual antiplatelet therapy with Aspirin / Plavix for a minimum of 30 days, will be discussed with surgeons artery perioperative management of Plavix.  3. Anticipate discharge in the morning if stable will follow Dr. Clarene Duke  The case and results was discussed with the patient (and family).  The case and results was not discussed with the patient's PCP.  The case and results was discussed with the patient's Cardiologist.   Discharge Exam: Blood pressure 175/75, pulse 88, temperature 98.1 F (36.7 C), temperature source Oral, resp. rate 20, height 6' (1.829 m), weight 113.9 kg (251 lb 1.7 oz), SpO2  98.00%.   Disposition: Home or Self Care  Discharge Orders    Future Orders Please Complete By Expires   Diet - low sodium heart healthy      Increase activity slowly      Discharge instructions      Comments:   No Lifting with the right arm for three days.  No driving for two days.     Medication List  As of 08/08/2011  8:53 AM   START taking these medications         metoprolol 50 MG 24 hr tablet   Commonly known as: TOPROL-XL   Take 1 tablet (50 mg total) by mouth daily.         CONTINUE taking these medications         amLODipine 5 MG tablet   Commonly known as: NORVASC      aspirin 81 MG tablet      clopidogrel 75 MG tablet   Commonly known as: PLAVIX      isosorbide mononitrate 30 MG 24 hr tablet   Commonly known as: IMDUR      losartan-hydrochlorothiazide 50-12.5 MG per tablet   Commonly known as: HYZAAR      metFORMIN 500 MG tablet   Commonly known as: GLUCOPHAGE   Take 1 tablet (500 mg total) by mouth daily.      Omega-3 Krill Oil 500 MG Caps          Where to get your medications    These are the prescriptions that you need to pick up.   You may get these medications from any pharmacy.         metFORMIN 500 MG tablet   metoprolol 50 MG 24 hr tablet             Signed: Vaiden Adames W 08/08/2011, 8:53 AM

## 2011-08-08 NOTE — Progress Notes (Addendum)
CARDIAC REHAB PHASE I   PRE:  Rate/Rhythm: 72SR  BP:  Supine:   Sitting: 126/83  Standing:    SaO2:   MODE:  Ambulation: 680 ft   POST:  Rate/Rhythem: 95SR  BP:  Supine:   Sitting: 175/75  Standing:    SaO2:  0745-0840 Pt walked 680 ft without chest pain but knee sore. Tolerated well. Education completed. Permission given to refer to [place] Phase 2. Encouraged pt to walk as tolerated. Notified PA of elevated BP after walk.  Duanne Limerick

## 2011-08-08 NOTE — Progress Notes (Signed)
[facility]  Subjective: No Complaints.  Objective: Vital signs in last 24 hours: Temp:  [97.5 F (36.4 C)-98.7 F (37.1 C)] 97.8 F (36.6 C) (01/10 0549) Pulse Rate:  [57-82] 68  (01/10 0027) Resp:  [13-21] 21  (01/10 0027) BP: (122-161)/(65-103) 161/66 mmHg (01/10 0027) SpO2:  [92 %-97 %] 97 % (01/10 0027) Weight:  [113.9 kg (251 lb 1.7 oz)] 113.9 kg (251 lb 1.7 oz) (01/10 0600)    Intake/Output from previous day: 01/09 0701 - 01/10 0700 In: 443 [P.O.:440; I.V.:3] Out: 2175 [Urine:2175] Intake/Output this shift:    Medications Current Facility-Administered Medications  Medication Dose Route Frequency Provider Last Rate Last Dose  . 0.9 %  sodium chloride infusion  1 mL/kg/hr Intravenous Continuous Marykay Lex 113.4 mL/hr at 08/07/11 1215 1 mL/kg/hr at 08/07/11 1215  . 0.9 %  sodium chloride infusion  250 mL Intravenous PRN Marykay Lex      . acetaminophen (TYLENOL) tablet 650 mg  650 mg Oral Q4H PRN Marykay Lex   650 mg at 08/07/11 1505  . amLODipine (NORVASC) tablet 5 mg  5 mg Oral Daily Marykay Lex      . aspirin chewable tablet 81 mg  81 mg Oral Daily Marykay Lex      . bivalirudin (ANGIOMAX) 250 MG injection           . bivalirudin (ANGIOMAX) 250 MG injection           . clopidogrel (PLAVIX) tablet 75 mg  75 mg Oral Daily Marykay Lex      . diphenhydrAMINE (BENADRYL) 50 MG/ML injection           . fentaNYL (SUBLIMAZE) 0.05 MG/ML injection           . heparin 2-0.9 UNIT/ML-% infusion           . hydrALAZINE (APRESOLINE) 20 MG/ML injection           . hydrochlorothiazide (MICROZIDE) capsule 12.5 mg  12.5 mg Oral Daily Marykay Lex      . isosorbide mononitrate (IMDUR) 24 hr tablet 30 mg  30 mg Oral Daily Marykay Lex      . lidocaine (XYLOCAINE) 1 % injection           . losartan (COZAAR) tablet 50 mg  50 mg Oral Daily Marykay Lex   50 mg at 08/07/11 1719  . metoprolol succinate (TOPROL-XL) 24 hr tablet  12.5 mg  12.5 mg Oral Daily Leone Brand, NP   12.5 mg at 08/07/11 1630  . midazolam (VERSED) 2 MG/2ML injection           . midazolam (VERSED) 2 MG/2ML injection           . morphine 2 MG/ML injection 2 mg  2 mg Intravenous Q2H PRN Marykay Lex      . nitroGLYCERIN (NTG ON-CALL) 0.2 mg/mL injection           . nitroGLYCERIN 0.2 mg/mL in dextrose 5 % infusion           . nitroGLYCERIN 0.2 mg/mL in dextrose 5 % infusion  2-200 mcg/min Intravenous Continuous Marykay Lex   10 mcg/min at 08/07/11 1215  . ondansetron (ZOFRAN) injection 4 mg  4 mg Intravenous Q6H PRN Marykay Lex      . sodium chloride 0.9 % injection 3 mL  3 mL Intravenous Q12H Marykay Lex   3 mL  at 08/07/11 1300  . sodium chloride 0.9 % injection 3 mL  3 mL Intravenous PRN Marykay Lex      . triazolam (HALCION) tablet 0.5 mg  0.5 mg Oral QHS PRN Kerry Hough, PA   0.5 mg at 08/08/11 0056  . verapamil (ISOPTIN) 2.5 MG/ML injection           . DISCONTD: 0.9 %  sodium chloride infusion   Intravenous Continuous Marykay Lex 75 mL/hr at 08/07/11 1478    . DISCONTD: aspirin tablet 81 mg  81 mg Oral Daily Marykay Lex      . DISCONTD: losartan-hydrochlorothiazide (HYZAAR) 50-12.5 MG per tablet 1 tablet  1 tablet Oral Daily Marykay Lex      . DISCONTD: sodium chloride 0.9 % injection 3 mL  3 mL Intravenous PRN Marykay Lex      . DISCONTD: zolpidem (AMBIEN) tablet 5 mg  5 mg Oral QHS PRN Marykay Lex        PE: General appearance: alert, cooperative and no distress Lungs: clear to auscultation bilaterally Heart: regular rate and rhythm, S1, S2 normal, no murmur, click, rub or gallop Extremities: No LEE Pulses: 2+ and symmetric radials Right wrist: no errythema  Lab Results:   Basename 08/08/11 0405  WBC 5.1  HGB 13.4  HCT 38.5*  PLT 172   BMET  Basename 08/08/11 0405  NA 140  K 3.7  CL 104  CO2 30  GLUCOSE 128*  BUN 10  CREATININE 0.91  CALCIUM 9.1    Studies/Results: Left  Heart Cath/PCI, 08/07/11 Hemodynamics:  Central Aortic Pressure: 148/73 mmHg; 103 mmHg  LV Pressure / LV End diastolic Pressure: 149/9 mmHg, EDP 16 mmHg  Coronary Angiographic Data:  Dominance: Right  Left Main: Large Caliber, bifurcates normally, angiographically normal  Left Anterior Descending (LAD): Large Tapering to moderate caliber vessel that reaches to & around the Apex.  1st Diagonal (D1): Small caliber, minimal luminal irregularities  2nd Diagonal Major (D2): Moderate caliber (2.25-2.5 mm) Ostial 95% stenosis followed by a proximal focal ~50-60% lesion At a branch point 3rd diagonal (D3): small-moderate with minimal luminal irregularities Circumflex (LCx): Large Caliber vessel, proximal 90% stenosis just after small OM1. The vessel gives off Large OM2, then shortly after bifurcates to modeare AVG Circ & OM3 - minimal luminal irreguarities 1st obtuse marginal: Small caliber, minimal luminal irregularities 2nd obtuse marginal: Moderate to large caliber, minimal luminal irregularities  3rd obtuse marginal: Moderate caliber, minimal luminal irregularities  Atrio-ventricular Groove (AVG) Cx: Moderate caliber, minimal luminal irregularities  Right Coronary Artery: Large (Dominant) patent stent, otherwise diffuse luminal irregularities Right Posterior Descending Artery (RPDA): Moderate caliber vessel, mid neck and a 40% lesion  Right Atrio-ventricular Groove Branch (RPAVB): Moderate caliber, minimal luminal irregularities;  Several posterior lateral branches with no severe stenoses, diffuse luminal irregularities PERCUTANEOUS CORONARY INTERVENTION PROCEDURE  After reviewing the initial cineangiography images, the culprit lesion(s) were identified -- partial left circumflex and ostial diagonal, and the decision was made to proceed with percutaneous coronary intervention.  The 5Fr Sheath was exchanged over a wire for a 6Fr sheath.  A weight based bolus of IV Angiomax was administered and the  drip was continued until completion of the procedure, with the plan to continue until currently complete.  The patient is a standing clopidogrel that was administered along with 325 mg of aspirin pre-cath.  The Guide catheter(s) were advanced over a J-wire and used to engage the left Coronary Artery.Marland Kitchen  An  ACT of > 200 Sec was confirmed prior to advancing the Guidewire.  Lesion #1: Proximal Left Circumflex  Pre-PCI Stenosis: 90 % Post-PCI Stenosis: 0 %  TIMI 3 flow TIMI 3 flow  Guide Catheter: 6 French XB 3.5 Guidewire: BMW  Pre-Dilitation Balloon: Empira 2.5 mm x 10 mm  1st Inflation: 10 Atm for 34 Sec  Scout angiography did not reveal evidence of dissection or perforation.  STENT: Veriflex BMS 3.5 mm x 12 mm  1st Inflation: 12 Atm for 30 Sec  2nd Inflation: 18 Atm for 45 Sec; final proximal diameter 4.1 mm  Post-Dilitation Balloon: [place]  1st Inflation: 14 Atm for 50 Sec; final distal diameter 4.26 mm  Postponement angiography revealed excellent stent apposition, and did not reveal evidence of dissection or perforation.  Lesion # 2: Ostial D2 (with proximal 50% lesion as well)  Pre-PCI Stenosis: 95 % Post-PCI Stenosis: 20 %  TIMI 3 flow TIMI 3 flow  Guide Catheter: 6 French XB 3.5 Guidewire: BMW  Pre-Dilitation Balloon: [place] Trek 1.5 mm x 12 mm  1st Inflation: 8 Atm for 31 Sec  Cutting Balloon #1: [place] Cutting Balloon 2.0 mm 10 mm  1st Inflation: 4 Atm for 30 Sec  2nd Inflation: 8 Atm for 45 Sec  3rd Inflation: 10 Atm for 60 Sec  Cutting Balloon #2 Flextome Cutting Balloon 2.25 mm x 10 mm  1st Inflation: 5 Atm for 60 Sec  2nd Inflation: 6 Atm for 120 Sec  3rd Inflation: 5 Atm for 120 Sec; at proximal 50% lesion; stenosis 10%  4nd Inflation: 8 Atm for 60 Sec; ostial lesion  5nd Inflation: 6 Atm for 150 Sec; ostial - final stenosis 20%  Intracoronary Nitroglycerin 200 mcg given prior to first Cutting Balloon inflation and following each final inflation of the balloon (total of 4  injections)  Post angioplasty imaging (with without wire) revealed twice a residuals to stenosis in the ostial lesion and 10-20% stenosis in the proximal lesion in the diagonal is felt to be adequate. There was TIMI-3 flow preserved in the diagonal as well as the LAD but no dissection noted in the LAD. There is no first perforation or dissection noted on the vessel.  After the guidewire was removed completely of the body and final angiography was performed, the guide cath was disengaged from the coronary artery and removed out of the body over wire without difficulty.  The sheath was removed in the Cath Lab with a TR band placed at 14 ml Air at 1056 hours (time). Reverse Allen's test with plethysmography revealed non-occlusive hemostasis.  The patient was transported to the PACU in hemodynamic stable, pain-free condition.  The patient was stable before, during and following the procedure.  Patient did tolerate procedure well.  There were not complications.  EBL: <10 mL  Medications:  Premedication: 5 mg Valium  Sedation: 3 mg IV Versed, 100 IV mcg Fentanyl, 25 mg IV Benadryl,  Contrast: 325 ml Omnipaque  Radial Cocktail: 5 mg Verapamil, 400 mcg NTG, 2 ml 2% Lidocaine  IV Heparin: 5500 units at sheath placement  Angiomax bolus and infusion (A standard weight based rate) administered with effusion continued post procedure until back complete  Impression:  1. Severe two-vessel disease involving the proximal Circumflex and ostial D2, status post successful bare-metal stent placement to the Circumflex and Cutting Balloon atherectomy/PTCA of the ostial Diagonal2.  2. Widely patent RCA stent with mild to moderate RPDA disease.  3. Normal ejection fraction of 66 5% no wall  motion abnormalities.  Plan:  1. Admit for overnight observation post PCI. Continue Angiomax infusion at current rate until back complete.  2. Dual antiplatelet therapy with Aspirin / Plavix for a minimum of 30 days, will be  discussed with surgeons artery perioperative management of Plavix.  3. Anticipate discharge in the morning if stable will follow Dr. Clarene Duke  Assessment/Plan  Principal Problem:  *Exertional angina Active Problems:  CAD (coronary artery disease), NEW 08/07/11 cutting balloon PTCA to 95% ostial diag. and 90%  proximal LCX.  prior cutting balloon to RCA  & ostium of ostial diag., 2009   HTN (hypertension)  DOE (dyspnea on exertion)  Diabetes mellitus  Hyperlipemia, intolerant to statins.  Arthritis of knee, left, needs total Knee in future with Dr. Lequita Halt   Plan:  S/P PCI to proximal circumflex and ostial D2 via radial approach.  Pt looks and feels good.  BP elevated 160-170s.  Will increase Toprol XL from 12.5mg  daily to 50mg . Losartan at 50mg  daily.  DC home today.  This W/U was part of preop for knee surgery.    LOS: 1 day    HAGER,BRYAN W 08/08/2011 8:10 AM    Patient seen and examined. Agree with assessment and plan. No recurrent chest pain. Cath site stable.  For D/C today. F/U with Dr. Clarene Duke.    Lennette Bihari, MD, [facility] 08/08/2011 8:39 AM

## 2011-08-18 ENCOUNTER — Other Ambulatory Visit: Payer: Self-pay | Admitting: Orthopedic Surgery

## 2011-08-18 NOTE — H&P (Signed)
Webb Silversmith  DOB: 01/10/1941 Married / Language: English / Race: White / Male  Date of Admission:  09/11/2011  Chief Complaint:  Left Knee Pain  History of Present Illness The patient is a 71 year old male who comes in today for a preoperative History and Physical. The patient is scheduled for a left total knee arthroplasty to be performed by Dr. Gus Rankin. Aluisio, MD at [facility] on 09/11/2011. Patient is accompanied today by his wife. They have been treated conservatively in the past for the above stated problem and despite conservative measures, they continue to have progressive pain and severe functional limitations and dysfunction. They have failed non-operative management including home exercise, medications, and injections. It is felt that they would benefit from undergoing total joint replacement. Risks and benefits of the procedure have been discussed with the patient and they elect to proceed with surgery. There are no active contraindications to surgery such as ongoing infection or rapidly progressive neurological disease. Mr. Salais feels he is at his whit's end with his left knee now. This continues to swell and be painful. The cortisone and visco supplements did not help. He has significant arthritis and synovitis on his arthroscopy with bone on bone changes in the patellofemoral compartments. The knee is hurting at all times. This is continuing to swell. This is limiting what he can and can not do. This hurts at night. This is affecting his ability to do his activities of daily living.  He elects to proceed with surgery.  Allergies No Known Drug Allergies  Medication History AmLODIPine Besylate (5MG  Tablet, Oral daily) Active. MetFORMIN HCl (500MG  Tablet, Oral daily) Active. Losartan Potassium-HCTZ (100-12.5MG  Tablet, Oral daily) Active. Isosorbide Mononitrate CR (30MG  Tablet ER 24HR, Oral daily) Active. Plavix (75MG  Tablet, Oral daily)  Active. Metoprolol Succinate ( Oral) Specific dose unknown - Active. Aspirin EC (81MG  Tablet DR, Oral daily) Active.  Problem List/Past Medical Osteoarthritis, knee Cataract Coronary artery disease Diabetes Mellitus, Type II High blood pressure Hypercholesterolemia Myocardial infarction Skin Cancer Sleep Apnea  Past Surgical History Arthroscopy of Knee. bilateral Tonsillectomy Total Knee Replacement. right Cardiac Catheterization - 1 stent, 1 ballon angioplasty. Date: 2008. Cardiac Cath. - 1 stent, 1 ballon angioplasty. Date: 07/2011.  Family History Cerebrovascular Accident. mother Hypertension. mother Osteoarthritis. father  Social History Alcohol use. Occasional alcohol use. current drinker; drinks wine; only occasionally per week Children. 2 Current work status. working part time Drug/Alcohol Rehab (Currently). no Drug/Alcohol Rehab (Previously). no Exercise. Exercises weekly; does running / walking Illicit drug use. no Living situation. live with spouse Marital status. married Number of flights of stairs before winded. 1 Pain Contract. no Tobacco / smoke exposure. no Tobacco use. former smoker; smoke(d) 2 1/2 pack(s) per day Advance Directives. Healthcare POA Pregnant. no  Review of Systems General:Not Present- Chills, Fever, Night Sweats, Appetite Loss, Fatigue, Feeling sick, Weight Gain and Weight Loss. Skin:Present- Psoriasis. Not Present- Itching, Rash, Skin Color Changes, Ulcer and Change in Hair or Nails. HEENT:Not Present- Sensitivity to light, Hearing problems, Nose Bleed and Ringing in the Ears. Neck:Not Present- Swollen Glands and Neck Mass. Respiratory:Not Present- Snoring, Chronic Cough, Bloody sputum and Dyspnea. Cardiovascular:Not Present- Shortness of Breath, Chest Pain, Swelling of Extremities, Leg Cramps and Palpitations. Gastrointestinal:Not Present- Bloody Stool, Heartburn, Abdominal Pain, Vomiting, Nausea and  Incontinence of Stool. Male Genitourinary:Not Present- Blood in Urine, Frequency, Incontinence and Nocturia. Musculoskeletal:Present- Joint Swelling and Joint Pain. Not Present- Muscle Weakness, Muscle Pain, Joint Stiffness and Back Pain. Neurological:Present- Burning. Not  Present- Tingling, Numbness, Tremor, Headaches and Dizziness. Psychiatric:Not Present- Anxiety, Depression and Memory Loss. Endocrine:Not Present- Cold Intolerance, Heat Intolerance, Excessive hunger and Excessive Thirst. Hematology:Not Present- Abnormal Bleeding, Anemia, Blood Clots and Easy Bruising.  Vitals Weight: 250 lb Height: 72 in Body Surface Area: 2.4 m Body Mass Index: 33.91 kg/m Pulse: 96 (Regular) Resp.: 16 (Unlabored) BP: 144/88 (Sitting, Right Arm, Standard)  Physical Exam The physical exam findings are as follows: Patient is a 71 year old male with continued knee pain. Patient is accompanied today by his wife.  General Mental Status - Alert, cooperative and good historian. General Appearance- pleasant. Not in acute distress. Orientation- Oriented X3. Build & Nutrition- Well nourished and Well developed.  Head and Neck Head- normocephalic, atraumatic . Neck Global Assessment- supple. no bruit auscultated on the right and no bruit auscultated on the left. Upper denture plate  Eye Pupil- Bilateral- Regular and Round. Note: wears glasses Motion- Bilateral- EOMI.  Chest and Lung Exam Auscultation: Breath sounds:- clear at anterior chest wall and - clear at posterior chest wall. Adventitious sounds:- No Adventitious sounds.  Cardiovascular Auscultation:Rhythm- Regular rate and rhythm. Heart Sounds- S1 WNL and S2 WNL. Murmurs & Other Heart Sounds:Auscultation of the heart reveals - No Murmurs.  Abdomen Palpation/Percussion:Tenderness- Abdomen is non-tender to palpation. Rigidity (guarding)- Abdomen is soft. Auscultation:Auscultation of the abdomen  reveals - Bowel sounds normal.  Male Genitourinary Not done, not pertinent to present illness  Musculoskeletal On exam he is in no distress. Left knee shows minimal swelling which is a big improvement from before. He has moderate crepitus on range of motion. His range is about 5 to 125. There is no instability. There is slight tenderness medial greater than lateral.  RADIOGRAPHS: The radiographs have shown bone on bone arthritis in the medial patellofemoral compartments with subchondral sclerosis.  Assessment & Plan Osteoarthritis Left Knee  Patient is for a Left Total Knee Replacement by Dr. Lequita Halt.  Plan is to go home after the surgery.  Cards - Dr. Caprice Kluver - Patient has been seen and cleared by Dr. Clarene Duke. May hold Aspirin and Plavix for 7 days prior to surgery. Restart both ASAP as per Dr. Clarene Duke.  Avel Peace, PA-C

## 2011-09-06 ENCOUNTER — Other Ambulatory Visit (HOSPITAL_COMMUNITY): Payer: Medicare Other

## 2011-09-11 ENCOUNTER — Encounter (HOSPITAL_COMMUNITY): Admission: RE | Payer: Self-pay | Source: Ambulatory Visit

## 2011-09-11 ENCOUNTER — Inpatient Hospital Stay (HOSPITAL_COMMUNITY): Admission: RE | Admit: 2011-09-11 | Payer: Medicare Other | Source: Ambulatory Visit | Admitting: Orthopedic Surgery

## 2011-09-11 SURGERY — ARTHROPLASTY, KNEE, TOTAL
Anesthesia: Choice | Site: Knee | Laterality: Left

## 2011-09-11 NOTE — Telephone Encounter (Signed)
Tolerated well

## 2011-10-02 ENCOUNTER — Encounter (HOSPITAL_COMMUNITY): Payer: Medicare Other | Attending: Oncology

## 2011-10-02 DIAGNOSIS — I2510 Atherosclerotic heart disease of native coronary artery without angina pectoris: Secondary | ICD-10-CM | POA: Insufficient documentation

## 2011-10-02 MED ORDER — HEPARIN SOD (PORK) LOCK FLUSH 100 UNIT/ML IV SOLN
500.0000 [IU] | Freq: Once | INTRAVENOUS | Status: AC
  Administered 2011-10-02: 500 [IU] via INTRAVENOUS

## 2011-10-02 MED ORDER — SODIUM CHLORIDE 0.9 % IJ SOLN
10.0000 mL | INTRAMUSCULAR | Status: DC | PRN
  Administered 2011-10-02: 10 mL via INTRAVENOUS

## 2011-10-02 NOTE — Progress Notes (Signed)
Tolerated port flush well.  Good blood return noted. 

## 2011-10-10 ENCOUNTER — Other Ambulatory Visit: Payer: Self-pay | Admitting: Dermatology

## 2011-12-10 ENCOUNTER — Other Ambulatory Visit: Payer: Self-pay | Admitting: Orthopedic Surgery

## 2011-12-10 NOTE — Progress Notes (Signed)
Preoperative surgical orders have been place into the Epic hospital system for Arthur Lutz on 12/10/2011, 5:38 PM  by Patrica Duel for surgery on 06/29/2012.  Preop Total Knee orders including Bupivacaine On-Q pump, IV Tylenol, and IV Decadron as long as there are no contraindications to the above medications.

## 2011-12-18 ENCOUNTER — Encounter (HOSPITAL_COMMUNITY): Payer: Medicare Other | Attending: Oncology

## 2011-12-18 DIAGNOSIS — I2510 Atherosclerotic heart disease of native coronary artery without angina pectoris: Secondary | ICD-10-CM | POA: Insufficient documentation

## 2011-12-18 MED ORDER — SODIUM CHLORIDE 0.9 % IJ SOLN
10.0000 mL | INTRAMUSCULAR | Status: DC | PRN
  Administered 2011-12-18: 10 mL via INTRAVENOUS
  Filled 2011-12-18: qty 10

## 2011-12-18 MED ORDER — HEPARIN SOD (PORK) LOCK FLUSH 100 UNIT/ML IV SOLN
500.0000 [IU] | Freq: Once | INTRAVENOUS | Status: AC
  Administered 2011-12-18: 500 [IU] via INTRAVENOUS
  Filled 2011-12-18: qty 5

## 2011-12-18 NOTE — Progress Notes (Signed)
Tolerated port flush well.  Good blood return. 

## 2012-02-27 ENCOUNTER — Encounter (HOSPITAL_COMMUNITY): Payer: Self-pay | Admitting: Pharmacy Technician

## 2012-02-27 ENCOUNTER — Other Ambulatory Visit: Payer: Self-pay | Admitting: Cardiology

## 2012-03-03 ENCOUNTER — Ambulatory Visit (HOSPITAL_COMMUNITY)
Admission: RE | Admit: 2012-03-03 | Discharge: 2012-03-03 | Disposition: A | Discharge status: Home / Self Care | Payer: Medicare Other | Source: Ambulatory Visit | Attending: Cardiology | Admitting: Cardiology

## 2012-03-03 ENCOUNTER — Other Ambulatory Visit (HOSPITAL_COMMUNITY): Payer: Self-pay | Admitting: Cardiology

## 2012-03-03 DIAGNOSIS — R079 Chest pain, unspecified: Secondary | ICD-10-CM | POA: Insufficient documentation

## 2012-03-05 MED ORDER — CHLORHEXIDINE GLUCONATE 4 % EX LIQD
60.0000 mL | Freq: Once | CUTANEOUS | Status: DC
  Filled 2012-03-05: qty 60

## 2012-03-05 MED ORDER — DEXAMETHASONE SODIUM PHOSPHATE 10 MG/ML IJ SOLN
10.0000 mg | Freq: Once | INTRAMUSCULAR | Status: DC
  Filled 2012-03-05: qty 1

## 2012-03-05 MED ORDER — BUPIVACAINE 0.25 % ON-Q PUMP SINGLE CATH 300ML
300.0000 mL | INJECTION | Status: DC
  Filled 2012-03-05: qty 300

## 2012-03-05 MED ORDER — CEFAZOLIN SODIUM-DEXTROSE 2-3 GM-% IV SOLR
2.0000 g | INTRAVENOUS | Status: DC
  Filled 2012-03-05: qty 50

## 2012-03-05 MED ORDER — SODIUM CHLORIDE 0.9 % IV SOLN: INTRAVENOUS | Status: DC

## 2012-03-05 MED ORDER — ACETAMINOPHEN 10 MG/ML IV SOLN
1000.0000 mg | Freq: Once | INTRAVENOUS | Status: DC
  Filled 2012-03-05: qty 100

## 2012-03-06 ENCOUNTER — Ambulatory Visit (HOSPITAL_COMMUNITY)
Admission: RE | Admit: 2012-03-06 | Discharge: 2012-03-06 | Disposition: A | Discharge status: Home / Self Care | Payer: Medicare Other | Source: Ambulatory Visit | Attending: Cardiology | Admitting: Cardiology

## 2012-03-06 ENCOUNTER — Encounter (HOSPITAL_COMMUNITY)
Admission: RE | Disposition: A | Discharge status: Home / Self Care | Payer: Self-pay | Source: Ambulatory Visit | Attending: Cardiology

## 2012-03-06 DIAGNOSIS — I209 Angina pectoris, unspecified: Secondary | ICD-10-CM | POA: Insufficient documentation

## 2012-03-06 DIAGNOSIS — I10 Essential (primary) hypertension: Secondary | ICD-10-CM | POA: Insufficient documentation

## 2012-03-06 DIAGNOSIS — E119 Type 2 diabetes mellitus without complications: Secondary | ICD-10-CM | POA: Insufficient documentation

## 2012-03-06 DIAGNOSIS — Z9861 Coronary angioplasty status: Secondary | ICD-10-CM | POA: Insufficient documentation

## 2012-03-06 DIAGNOSIS — I2510 Atherosclerotic heart disease of native coronary artery without angina pectoris: Secondary | ICD-10-CM | POA: Insufficient documentation

## 2012-03-06 HISTORY — PX: LEFT HEART CATHETERIZATION WITH CORONARY ANGIOGRAM: SHX5451

## 2012-03-06 SURGERY — LEFT HEART CATHETERIZATION WITH CORONARY ANGIOGRAM
Anesthesia: LOCAL

## 2012-03-06 MED ORDER — MORPHINE SULFATE 4 MG/ML IJ SOLN: 2.0000 mg | INTRAMUSCULAR | Status: DC | PRN

## 2012-03-06 MED ORDER — CEFAZOLIN SODIUM-DEXTROSE 2-3 GM-% IV SOLR
INTRAVENOUS | Status: AC
  Filled 2012-03-06: qty 50

## 2012-03-06 MED ORDER — VERAPAMIL HCL 2.5 MG/ML IV SOLN
INTRAVENOUS | Status: AC
  Filled 2012-03-06: qty 2

## 2012-03-06 MED ORDER — FENTANYL CITRATE 0.05 MG/ML IJ SOLN
INTRAMUSCULAR | Status: AC
  Filled 2012-03-06: qty 2

## 2012-03-06 MED ORDER — SODIUM CHLORIDE 0.9 % IV SOLN
INTRAVENOUS | Status: DC
  Administered 2012-03-06: 07:00:00 via INTRAVENOUS

## 2012-03-06 MED ORDER — DEXAMETHASONE SODIUM PHOSPHATE 10 MG/ML IJ SOLN
INTRAMUSCULAR | Status: AC
  Filled 2012-03-06: qty 1

## 2012-03-06 MED ORDER — SODIUM CHLORIDE 0.9 % IV SOLN
INTRAVENOUS | Status: DC
  Administered 2012-03-06: 12:00:00 via INTRAVENOUS

## 2012-03-06 MED ORDER — LIDOCAINE HCL (PF) 1 % IJ SOLN
INTRAMUSCULAR | Status: AC
  Filled 2012-03-06: qty 30

## 2012-03-06 MED ORDER — ASPIRIN 81 MG PO TABS: 81.0000 mg | ORAL_TABLET | Freq: Every day | ORAL | Status: DC

## 2012-03-06 MED ORDER — MIDAZOLAM HCL 2 MG/2ML IJ SOLN
INTRAMUSCULAR | Status: AC
  Filled 2012-03-06: qty 2

## 2012-03-06 MED ORDER — METOPROLOL SUCCINATE 12.5 MG HALF TABLET: 12.5000 mg | ORAL_TABLET | Freq: Every day | ORAL | Status: DC

## 2012-03-06 MED ORDER — HEPARIN (PORCINE) IN NACL 2-0.9 UNIT/ML-% IJ SOLN
INTRAMUSCULAR | Status: AC
  Filled 2012-03-06: qty 2000

## 2012-03-06 MED ORDER — ONDANSETRON HCL 4 MG/2ML IJ SOLN: 4.0000 mg | Freq: Four times a day (QID) | INTRAMUSCULAR | Status: DC | PRN

## 2012-03-06 MED ORDER — HEPARIN SODIUM (PORCINE) 1000 UNIT/ML IJ SOLN
INTRAMUSCULAR | Status: AC
  Filled 2012-03-06: qty 1

## 2012-03-06 MED ORDER — CLOPIDOGREL BISULFATE 75 MG PO TABS: 75.0000 mg | ORAL_TABLET | Freq: Every day | ORAL | Status: DC

## 2012-03-06 MED ORDER — METFORMIN HCL 500 MG PO TABS: 1000.0000 mg | ORAL_TABLET | Freq: Every day | ORAL | Status: DC

## 2012-03-06 MED ORDER — SODIUM CHLORIDE 0.9 % IJ SOLN: 3.0000 mL | INTRAMUSCULAR | Status: DC | PRN

## 2012-03-06 MED ORDER — ISOSORBIDE MONONITRATE ER 30 MG PO TB24: 30.0000 mg | ORAL_TABLET | Freq: Every day | ORAL | Status: DC

## 2012-03-06 MED ORDER — NITROGLYCERIN 0.2 MG/ML ON CALL CATH LAB
INTRAVENOUS | Status: AC
  Filled 2012-03-06: qty 1

## 2012-03-06 MED ORDER — ACETAMINOPHEN 325 MG PO TABS: 650.0000 mg | ORAL_TABLET | ORAL | Status: DC | PRN

## 2012-03-06 MED ORDER — DIAZEPAM 5 MG PO TABS
5.0000 mg | ORAL_TABLET | ORAL | Status: AC
  Administered 2012-03-06: 5 mg via ORAL
  Filled 2012-03-06: qty 1

## 2012-03-06 MED ORDER — DIPHENHYDRAMINE HCL 50 MG/ML IJ SOLN
INTRAMUSCULAR | Status: AC
  Filled 2012-03-06: qty 1

## 2012-03-06 NOTE — H&P (Signed)
History and Physical Interval Note:  NAME:  Arthur Lutz   MRN: [id number] DOB:  02-Aug-1940   ADMIT DATE: 03/06/2012   03/06/2012 8:20 AM  Arthur Lutz is a 71 y.o. male with PMH below - s/p PCI of LCx & cutting balloon PTCA of Ostial Diag in Jan 2013.  Seen by Dr. Clarene Duke on 02/27/12.  Please see the OP Clinic note for details. He has noted occasional episods of exertional throat tightness as well as chest tightness radiating to the arm over the past few weeks. He notes that these occur with minimal activity.  AS he is scheduled for Knee Sgx later this year & had recent PCI, Dr. Clarene Duke felt that the most expeditious way to investigate his anatomy in the setting of recurrent Unstable Angina would be via cardiac catheterization. He has had an incorrect (false-negative) Myoview ST prior his catheterization in Aug 2012 despite undergoing Cutting balloon PTCA of the diagonal stenosis.  He has also had PCI of the RCA ion 2009.  He is now referred for cardiac catheterization.  Past Medical History  Diagnosis Date  . Hypertension   . Diabetes mellitus   . Allergic rhinitis   . CAD (coronary artery disease)   . CAD (coronary artery disease), NEW 08/07/11 cutting balloon PTCA to 95% ostial diag. and 90%  proximal LCX.  prior cutting balloon and to RCA  & ostium of ostial diag.  08/07/2011  . HTN (hypertension) 08/07/2011  . High cholesterol   . Angina   . Heart attack ~ 2009    "mild; did not affect the heart muscle"  . Sleep apnea 08/07/11    "I haven't proven it but I have it"  . Shortness of breath on exertion   . Arthritis   . 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 Melanoma of skin ~ 2005    "level 2; on my back"  . Bladder cancer 11/2005  . DOE (dyspnea on exertion) 08/07/2011  . Exertional angina 08/07/2011  . Hyperlipemia, intolerant to statins. 08/07/2011  . Arthritis of knee, left, needs total Knee in future with Dr. Lequita Halt  08/07/2011    Past Surgical History  Procedure Date  . Total knee arthroplasty 11/2003    right  . Coronary angioplasty with stent placement ~ 2009    "!"  . Coronary angioplasty with stent placement 08/07/11    "1"  . Inguinal hernia repair ~ 1970    left  . Tonsillectomy     "as a child"  . Cataract extraction w/ intraocular lens implant ~ 2010    right  . Partial knee arthroplasty 06/2003    right  . Knee arthroscopy 02/20/11    left  . Cold cup removal bladder lesion 11/2005    "malignant"    FAMHx: Family History  Problem Relation Age of Onset  . Emphysema Father   . Lung cancer Paternal Uncle     x 2 smokers    SOCHx:  reports that he quit smoking about 31 years ago. His smoking use included Cigarettes. He has a 50 pack-year smoking history. He has never used smokeless tobacco. He reports that he drinks about 1.2 ounces of alcohol per week. He reports that he does not use illicit drugs.  ALLERGIES: Allergies  Allergen Reactions  . Statins Other (See Comments)    myalgias     HOME MEDICATIONS: Prescriptions prior to admission  Medication Sig Dispense Refill  . amLODipine (NORVASC) 5 MG tablet Take 1 tablet by mouth daily.      Marland Kitchen aspirin 81 MG tablet Take 81 mg by mouth daily.       . clopidogrel (PLAVIX) 75 MG tablet Take 75 mg by mouth daily.       Marland Kitchen ibuprofen (ADVIL,MOTRIN) 200 MG tablet Take 400 mg by mouth every 6 (six) hours as needed. For pain      . isosorbide mononitrate (IMDUR) 30 MG 24 hr tablet Take 1 tablet by mouth daily.      Marland Kitchen losartan-hydrochlorothiazide (HYZAAR) 100-25 MG per tablet Take 1 tablet by mouth daily.      . metFORMIN (GLUCOPHAGE) 500 MG tablet Take 1,000 mg by mouth daily.      . metoprolol succinate (TOPROL-XL) 25 MG 24 hr tablet Take 12.5 mg by mouth daily.        PHYSICAL EXAM:Blood pressure 135/74, pulse 54, temperature 97.3 F (36.3 C), temperature source Oral, resp. rate 18, height 6' (1.829 m), weight 112.038 kg (247 lb), SpO2  93.00%. General appearance: alert, cooperative, appears stated age and no distress Neck: no adenopathy, no carotid bruit, no JVD, supple, symmetrical, trachea midline and thyroid not enlarged, symmetric, no tenderness/mass/nodules Lungs: clear to auscultation bilaterally, normal percussion bilaterally and non-labored Heart: regular rate and rhythm, S1, S2 normal, no murmur, click, rub or gallop Abdomen: soft, non-tender; bowel sounds normal; no masses,  no organomegaly Extremities: extremities normal, atraumatic, no cyanosis or edema Pulses: 2+ and symmetric Neurologic: Grossly normal; CN II-XII grossly intact. L sided Port-a-cath in place on chest.  IMPRESSION & PLAN The patients' history has been reviewed, patient examined, no change in status from most recent note, stable for surgery. I have reviewed the patients' chart and labs. Questions were answered to the patient's satisfaction.    MATEI MAGNONE has presented today for cardiac catheterization, with the diagnosis of unstable angina. The various methods of treatment have been discussed with the patient and family.   Risks / Complications include, but not limited to: Death, MI, CVA/TIA, VF/VT (with defibrillation), Bradycardia (need for temporary pacer placement), contrast induced nephropathy, bleeding / bruising / hematoma / pseudoaneurysm, vascular or coronary injury (with possible emergent CT or Vascular Surgery), adverse medication reactions, infection.     After consideration of risks, benefits and other options for treatment, the patient has consented to Procedure(s):  LEFT HEART CATHETERIZATION AND CORONARY ANGIOGRAPHY +/- AD HOC PERCUTANEOUS CORONARY INTERVENTION   as a surgical intervention.   We will proceed with the planned procedure.   HARDING,DAVID W [facility] [address] [place], [place]  [address]  [phone]  03/06/2012 8:20 AM

## 2012-03-06 NOTE — CV Procedure (Signed)
[facility] PERCUTANEOUS CORONARY INTERVENTION REPORT  NAME:  Arthur Lutz   MRN: [id number] DOB:  Jul 15, 1941   ADMIT DATE: 03/06/2012  INTERVENTIONAL CARDIOLOGIST: Marykay Lex, M.D., MS PRIMARY CARE PROVIDER: Kirk Ruths, MD PRIMARY CARDIOLOGIST:   PATIENT:  Arthur Lutz is a 71 y.o. male with PMH below - s/p PCI of LCx & cutting balloon PTCA of Ostial Diag in Jan 2013. Seen by Dr. Clarene Duke on 02/27/12. Please see the OP Clinic note for details.  He has noted occasional episods of exertional throat tightness as well as chest tightness radiating to the arm over the past few weeks.  He notes that these occur with minimal activity. AS he is scheduled for Knee Sgx later this year & had recent PCI, Dr. Clarene Duke felt that the most expeditious way to investigate his anatomy in the setting of recurrent Unstable Angina would be via cardiac catheterization.  He has had an incorrect (false-negative) Myoview ST prior his catheterization in Aug 2012 despite undergoing Cutting balloon PTCA of the diagonal stenosis. He has also had PCI of the RCA ion 2009.  Past Medical History  Diagnosis Date  . Hypertension   . Diabetes mellitus   . Allergic rhinitis   . CAD (coronary artery disease)   . CAD (coronary artery disease), NEW 08/07/11 cutting balloon PTCA to 95% ostial diag. and 90%  proximal LCX.  prior cutting balloon and to RCA  & ostium of ostial diag.  08/07/2011  . HTN (hypertension) 08/07/2011  . High cholesterol   . Angina   . Heart attack ~ 2009    "mild; did not affect the heart muscle"  . Sleep apnea 08/07/11    "I haven't proven it but I have it"  . Shortness of breath on exertion   . Arthritis   . 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 Melanoma of skin ~ 2005    "level 2; on my back"  . Bladder cancer 11/2005  . DOE (dyspnea on exertion) 08/07/2011  . Exertional angina 08/07/2011  . Hyperlipemia, intolerant  to statins. 08/07/2011  . Arthritis of knee, left, needs total Knee in future with Dr. Lequita Halt  08/07/2011    PRE-OPERATIVE DIAGNOSIS:    Exertional angina concerning for crescendo unstable angina.  PROCEDURES PERFORMED:    Left Heart Catheterization via 5 Fr Right Radial Artery Access access   Left Ventriculography, (RAO) 11 ml/sec for 33 ml total contrast   Native Coronary Angiography  PROCEDURE: Consent: The procedure with Risks/Benefits/Alternatives and Indications was reviewed with the patient (and family).  All questions were answered.    Risks / Complications include, but not limited to: Death, MI, CVA/TIA, VF/VT (with defibrillation), Bradycardia (need for temporary pacer placement), contrast induced nephropathy, bleeding / bruising / hematoma / pseudoaneurysm, vascular or coronary injury (with possible emergent CT or Vascular Surgery), adverse medication reactions, infection.    The patient (and family) voice understanding and agree to proceed.    Risks of procedure as well as the alternatives and risks of each were explained to the (patient/caregiver).  Consent for procedure obtained. Consent for signed by MD and patient with RN witness -- placed on chart.  PROCEDURE: The patient was brought to the 2nd Floor [facility] in the fasting state and prepped and draped in the usual sterile fashion for Right radial or groin access. A modified Allen's test with plethysmography  was performed on the right wrist demonstrating adequate Ulnar Artery collateral flow.  Sterile technique was used including antiseptics, cap, gloves, gown, hand hygiene, mask and sheet.  Skin prep: Chlorhexidine;  Time Out: Verified patient identification, verified procedure, site/side was marked, verified correct patient position, special equipment/implants available, medications/allergies/relevent history reviewed, required imaging and test results available.  Performed  Access: Right  Radial Artery; 6 Fr Sheath, Seldinger technique. Diagnostic:  6 [place] TIG 4.0 for Left and Right Coronary Angiography; Angled Pigtail for LV gram and LV hemodynamics. Initial catheter advanced over Versicore wire, catheter exchanged over long exchange Safety J-wire.  Hemodynamics:  Central Aortic / Mean Pressures: 140/88 mmHg; 108 mmHg  Left Ventricular Pressures / EDP: 138/12 mmHg; 16 mmHg  Left Ventriculography:  EF: 60-65 %  Wall Motion: normal  Coronary Anatomy:  Left Main: large caliber, bifurcates into LAD and left circumflex. Minimal luminal irregularities Left anterior descending (LAD): Large caliber, mild luminal irregularities at the major diagonal (D2) takeoff;small D1 that is insignificant.  The vessel reaches down around the apex and diffuse luminal irregularities distally.   D2: As the major branch that has ostial 40% stenosis at the prior PTCA site followed by 30% lesion distal to that was also angioplastied. The remainder the vessel has minimal luminal irregularities.  D3: Small moderate caliber vessel mild luminal regularities Left Circumflex: Large caliber vessel widely patent proximal stent with a small OM1 that is jailed. Following the stent the vessel gives off a large OM 2 and bifurcates into the AV groove circumflex and OM 3. The remainder vessels with minimal luminal irregularities the 1st obtuse marginal: Small caliber, minimal luminal irregularities 2nd obtuse marginal: Moderate to large caliber, minimal luminal irregularities  3rd obtuse marginal: Moderate caliber, minimal luminal irregularities  Atrio-ventricular Groove (AVG) Cx: Moderate caliber, minimal luminal irregularities  Right Coronary Artery: Large (Dominant) patent stent, otherwise diffuse luminal irregularities Right Posterior Descending Artery (RPDA): Moderate caliber vessel, mid neck and a 40% lesion  Right Atrio-ventricular Groove Branch (RPAVB): Moderate caliber, minimal luminal  irregularities;  Several posterior lateral branches with no severe stenoses, diffuse luminal irregularities  TR Band:  0945 Hours, 16 mL air  ANESTHESIA:   Local Lidocaine 2 ml SEDATION:  1 mg IV Versed, 50 mcg IV fentanyl ; Premedication: 5 mg PO Valium, 25 mg IV Benadryl MEDICATIONS:  Anticoagulation: 5000 Units IV Heparin  Anti-Platelet Agent: standing Plavix  Radial Cocktail: 5 mg Verapamil, 400 mcg NTG, 2 ml 2% Lidocaine in 10 ml NS  EBL:   < 10 ml  PATIENT DISPOSITION:    The patient was transferred to the PACU holding area in a hemodynamicaly stable, chest pain free condition.  The patient tolerated the procedure well, and there were no complications.  The patient was stable before, during, and after the procedure.  POST-OPERATIVE DIAGNOSIS:    Minimal recoil of the Angioplastied Ostial Diagonal 2 site with residual 40% lesion not likely to be causing exertional angina.   Widely patent stents in the RCA and recently placed Stent in the Circumflex.  Well-preserved EF with no wall motion abnormalities. Normal EDP  No visible lesion to explain the patient's exertional chest discomfort  PLAN OF CARE:  Standard post-radial care with plan discharge today.  Anticipate discharge later today with followup with Dr. little.   Marykay Lex, M.D., M.S. [facility] [address] [place], [place]  [address]  [phone]  03/06/2012 9:53 AM

## 2012-04-03 ENCOUNTER — Encounter (HOSPITAL_COMMUNITY): Payer: Medicare Other

## 2012-04-17 ENCOUNTER — Encounter (HOSPITAL_COMMUNITY): Payer: Medicare Other | Attending: Internal Medicine

## 2012-04-17 ENCOUNTER — Encounter (HOSPITAL_COMMUNITY): Payer: Medicare Other

## 2012-04-17 DIAGNOSIS — I2510 Atherosclerotic heart disease of native coronary artery without angina pectoris: Secondary | ICD-10-CM

## 2012-04-17 DIAGNOSIS — Z452 Encounter for adjustment and management of vascular access device: Secondary | ICD-10-CM | POA: Insufficient documentation

## 2012-04-17 MED ORDER — SODIUM CHLORIDE 0.9 % IJ SOLN
10.0000 mL | INTRAMUSCULAR | Status: DC | PRN
  Administered 2012-04-17: 10 mL via INTRAVENOUS

## 2012-04-17 MED ORDER — HEPARIN SOD (PORK) LOCK FLUSH 100 UNIT/ML IV SOLN
500.0000 [IU] | Freq: Once | INTRAVENOUS | Status: AC
  Administered 2012-04-17: 500 [IU] via INTRAVENOUS

## 2012-04-17 NOTE — Progress Notes (Signed)
Tolerated port flush well. 

## 2012-07-03 ENCOUNTER — Other Ambulatory Visit (HOSPITAL_COMMUNITY): Payer: Self-pay | Admitting: Dermatology

## 2012-07-03 DIAGNOSIS — C439 Malignant melanoma of skin, unspecified: Secondary | ICD-10-CM

## 2012-07-08 ENCOUNTER — Other Ambulatory Visit: Payer: Self-pay | Admitting: Dermatology

## 2012-07-09 ENCOUNTER — Ambulatory Visit (HOSPITAL_COMMUNITY)
Admission: RE | Admit: 2012-07-09 | Discharge: 2012-07-09 | Disposition: A | Discharge status: Home / Self Care | Payer: Medicare Other | Source: Ambulatory Visit | Attending: Dermatology | Admitting: Dermatology

## 2012-07-09 DIAGNOSIS — C439 Malignant melanoma of skin, unspecified: Secondary | ICD-10-CM

## 2012-07-09 DIAGNOSIS — R0602 Shortness of breath: Secondary | ICD-10-CM | POA: Insufficient documentation

## 2012-07-09 MED ORDER — IOHEXOL 300 MG/ML  SOLN
80.0000 mL | Freq: Once | INTRAMUSCULAR | Status: AC | PRN
  Administered 2012-07-09: 80 mL via INTRAVENOUS

## 2012-08-10 ENCOUNTER — Encounter (HOSPITAL_COMMUNITY): Payer: Medicare Other

## 2012-08-11 ENCOUNTER — Encounter (HOSPITAL_COMMUNITY): Payer: Medicare Other | Attending: Oncology

## 2012-08-11 ENCOUNTER — Encounter (HOSPITAL_COMMUNITY): Payer: Self-pay

## 2012-08-11 DIAGNOSIS — Z452 Encounter for adjustment and management of vascular access device: Secondary | ICD-10-CM

## 2012-08-11 DIAGNOSIS — I2510 Atherosclerotic heart disease of native coronary artery without angina pectoris: Secondary | ICD-10-CM

## 2012-08-11 DIAGNOSIS — Z95828 Presence of other vascular implants and grafts: Secondary | ICD-10-CM

## 2012-08-11 HISTORY — DX: Presence of other vascular implants and grafts: Z95.828

## 2012-08-11 MED ORDER — SODIUM CHLORIDE 0.9 % IJ SOLN
10.0000 mL | INTRAMUSCULAR | Status: DC | PRN
  Administered 2012-08-11: 10 mL via INTRAVENOUS
  Filled 2012-08-11: qty 10

## 2012-08-11 MED ORDER — HEPARIN SOD (PORK) LOCK FLUSH 100 UNIT/ML IV SOLN
500.0000 [IU] | Freq: Once | INTRAVENOUS | Status: AC
  Administered 2012-08-11: 500 [IU] via INTRAVENOUS
  Filled 2012-08-11: qty 5

## 2012-08-11 MED ORDER — HEPARIN SOD (PORK) LOCK FLUSH 100 UNIT/ML IV SOLN
INTRAVENOUS | Status: AC
  Filled 2012-08-11: qty 5

## 2012-08-11 NOTE — Progress Notes (Signed)
Webb Silversmith presented for Portacath access and flush. Proper placement of portacath confirmed by CXR. Portacath located right chest wall accessed with  H 20 needle. Good blood return present. Portacath flushed with 20ml NS and 500U/84ml Heparin and needle removed intact. Procedure without incident. Patient tolerated procedure well.

## 2012-08-24 ENCOUNTER — Other Ambulatory Visit (HOSPITAL_COMMUNITY): Payer: Self-pay | Admitting: Cardiology

## 2012-08-24 DIAGNOSIS — I2510 Atherosclerotic heart disease of native coronary artery without angina pectoris: Secondary | ICD-10-CM

## 2012-08-28 ENCOUNTER — Ambulatory Visit (HOSPITAL_COMMUNITY)
Admission: RE | Admit: 2012-08-28 | Discharge: 2012-08-28 | Disposition: A | Discharge status: Home / Self Care | Payer: Medicare Other | Source: Ambulatory Visit | Attending: Cardiology | Admitting: Cardiology

## 2012-08-28 DIAGNOSIS — R0989 Other specified symptoms and signs involving the circulatory and respiratory systems: Secondary | ICD-10-CM | POA: Insufficient documentation

## 2012-08-28 DIAGNOSIS — E119 Type 2 diabetes mellitus without complications: Secondary | ICD-10-CM | POA: Insufficient documentation

## 2012-08-28 DIAGNOSIS — I2510 Atherosclerotic heart disease of native coronary artery without angina pectoris: Secondary | ICD-10-CM

## 2012-08-28 DIAGNOSIS — I10 Essential (primary) hypertension: Secondary | ICD-10-CM | POA: Insufficient documentation

## 2012-08-28 DIAGNOSIS — R0609 Other forms of dyspnea: Secondary | ICD-10-CM | POA: Insufficient documentation

## 2012-08-28 HISTORY — PX: NM MYOVIEW LTD: HXRAD82

## 2012-08-28 MED ORDER — REGADENOSON 0.4 MG/5ML IV SOLN
0.4000 mg | Freq: Once | INTRAVENOUS | Status: AC
  Administered 2012-08-28: 0.4 mg via INTRAVENOUS

## 2012-08-28 MED ORDER — TECHNETIUM TC 99M SESTAMIBI GENERIC - CARDIOLITE
30.1000 | Freq: Once | INTRAVENOUS | Status: AC | PRN
  Administered 2012-08-28: 30.1 via INTRAVENOUS

## 2012-08-28 MED ORDER — TECHNETIUM TC 99M SESTAMIBI GENERIC - CARDIOLITE
10.6000 | Freq: Once | INTRAVENOUS | Status: AC | PRN
  Administered 2012-08-28: 11 via INTRAVENOUS

## 2012-08-28 NOTE — Procedures (Addendum)
[facility] [address] [place] [address] [phone]  Cardiology Nuclear Med Study  Arthur Lutz is a 72 y.o. male     MRN : [id number]     DOB: 1941/06/30  Procedure Date: 08/28/2012  Nuclear Med Background Indication for Stress Test:  Evaluation for Ischemia History:  CAD Cardiac Risk Factors: History of Smoking, Hypertension, Lipids, NIDDM and Obesity  Symptoms:  DOE and throat tightness   Nuclear Pre-Procedure Caffeine/Decaff Intake:  7:00pm NPO After: 5:00am   IV Site: R Antecubital  IV 0.9% NS with Angio Cath:  22g  Chest Size (in):  48 IV Started by: Koren Shiver, CNMT  Height: 6' (1.829 m)  Cup Size: n/a  BMI:  Body mass index is 34.31 kg/(m^2). Weight:  253 lb (114.76 kg)   Tech Comments:  n/a    Nuclear Med Study 1 or 2 day study: 1 day  Stress Test Type:  Lexiscan  Order Authorizing Provider:  Bryan Lemma, MD   Resting Radionuclide: Technetium 41m Sestamibi  Resting Radionuclide Dose: 10.6 mCi   Stress Radionuclide:  Technetium 33m Sestamibi  Stress Radionuclide Dose: 30.1 mCi           Stress Protocol Rest HR: 54 Stress HR: 88  Rest BP: 151/82 Stress BP: 167/83  Exercise Time (min): n/a METS: n/a          Dose of Adenosine (mg):  n/a Dose of Lexiscan: 0.4 mg  Dose of Atropine (mg): n/a Dose of Dobutamine: n/a mcg/kg/min (at max HR)  Stress Test Technologist: Ernestene Mention, CCT Nuclear Technologist: Gonzella Lex, CNMT   Rest Procedure:  Myocardial perfusion imaging was performed at rest 45 minutes following the intravenous administration of Technetium 72m Sestamibi. Stress Procedure:  The patient received IV Lexiscan 0.4 mg over 15-seconds.  Technetium 100m Sestamibi injected at 30-seconds.  There were no significant changes with Lexiscan.  Quantitative spect images were obtained after a 45 minute delay.  Transient Ischemic Dilatation (Normal <1.22):  1.04 Lung/Heart Ratio (Normal  <0.45):  0.28 QGS EDV:  137 ml QGS ESV:  70 ml LV Ejection Fraction: 49%  Signed by   Rest ECG: Sinus bradycardia. Incomplete RBBB.  Stress ECG: No adenosine EKG changes.  QPS Raw Data Images:  Normal; no motion artifact; normal heart/lung ratio. Stress Images:  2 small fixed defects, apical septal and apical, likely artifact. Rest Images:  2 small fixed defects, apical septal and apical, likely artifact. Subtraction (SDS):  No evidence of ischemia.  Impression Exercise Capacity:  Adenosine study with no exercise. BP Response:  Normal blood pressure response. Clinical Symptoms:  No symptoms. ECG Impression:  No significant ST segment change with adenosine. Comparison with Prior Nuclear Study: No significant change from previous study  Overall Impression:  Low risk stress nuclear study.  LV Wall Motion:  Mild global hypokinesis. LVEF 49%. Improved from prior nuclear study in 2012.  Chrystie Nose, MD, [facility] Attending Cardiologist [facility]  Chrystie Nose, MD  08/29/2012 12:53 PM

## 2012-08-31 ENCOUNTER — Other Ambulatory Visit: Payer: Self-pay | Admitting: Orthopedic Surgery

## 2012-08-31 MED ORDER — BUPIVACAINE LIPOSOME 1.3 % IJ SUSP: 20.0000 mL | Freq: Once | INTRAMUSCULAR | Status: DC

## 2012-08-31 MED ORDER — DEXAMETHASONE SODIUM PHOSPHATE 10 MG/ML IJ SOLN: 10.0000 mg | Freq: Once | INTRAMUSCULAR | Status: DC

## 2012-08-31 NOTE — Progress Notes (Signed)
Preoperative surgical orders have been place into the Epic hospital system for HALFORD GOETZKE on 08/31/2012, 7:37 AM  by Patrica Duel for surgery on [id number]/32014.  Preop Total Knee orders including Experal, IV Tylenol, and IV Decadron as long as there are no contraindications to the above medications. Avel Peace, PA-C

## 2012-09-15 ENCOUNTER — Encounter (HOSPITAL_COMMUNITY): Payer: Self-pay | Admitting: Pharmacy Technician

## 2012-09-18 NOTE — Patient Instructions (Signed)
Arthur Lutz  09/18/2012   Your procedure is scheduled on:  09/28/12   Report to [facility] at  [phone]  AM.  Call this number if you have problems the morning of surgery: [phone]   Remember:   Do not eat food or drink liquids after midnight.   Take these medicines the morning of surgery with A SIP OF WATER:    Do not wear jewelry,   Do not wear lotions, powders, or perfumes.    Men may shave face and neck.  Do not bring valuables to the hospital.  Contacts, dentures or bridgework may not be worn into surgery.  Leave suitcase in the car. After surgery it may be brought to your room.  For patients admitted to the hospital, checkout time is 11:00 AM the day of  discharge.     SEE CHG INSTRUCTION SHEET    Please read over the following fact sheets that you were given: MRSA Information, coughing and deep breathing exercises, leg exercises, Blood Transfusion Fact sheet, Incentive Spirometry Fact sheet.                Failure to comply with these instructions may result in cancellation of your surgery.                Patient Signature ____________________________              Nurse Signature _____________________________

## 2012-09-21 ENCOUNTER — Inpatient Hospital Stay (HOSPITAL_COMMUNITY): Admission: RE | Admit: 2012-09-21 | Payer: Medicare Other | Source: Ambulatory Visit

## 2012-09-21 ENCOUNTER — Encounter (HOSPITAL_COMMUNITY): Payer: Self-pay

## 2012-09-21 ENCOUNTER — Encounter (HOSPITAL_COMMUNITY)
Admission: RE | Admit: 2012-09-21 | Discharge: 2012-09-21 | Disposition: A | Discharge status: Home / Self Care | Payer: Medicare Other | Source: Ambulatory Visit | Attending: Orthopedic Surgery | Admitting: Orthopedic Surgery

## 2012-09-21 LAB — COMPREHENSIVE METABOLIC PANEL
ALT: 27 U/L (ref 0–53)
AST: 20 U/L (ref 0–37)
Albumin: 3.9 g/dL (ref 3.5–5.2)
Alkaline Phosphatase: 76 U/L (ref 39–117)
BUN: 11 mg/dL (ref 6–23)
Chloride: 99 mEq/L (ref 96–112)
Total Bilirubin: 0.5 mg/dL (ref 0.3–1.2)

## 2012-09-21 LAB — CBC
Hemoglobin: 14.6 g/dL (ref 13.0–17.0)
MCH: 31.9 pg (ref 26.0–34.0)
MCHC: 34.6 g/dL (ref 30.0–36.0)
MCV: 92.1 fL (ref 78.0–100.0)
RBC: 4.58 MIL/uL (ref 4.22–5.81)

## 2012-09-21 LAB — URINALYSIS, ROUTINE W REFLEX MICROSCOPIC
Bilirubin Urine: NEGATIVE
Hgb urine dipstick: NEGATIVE
Specific Gravity, Urine: 1.012 (ref 1.005–1.030)
pH: 5.5 (ref 5.0–8.0)

## 2012-09-21 LAB — SURGICAL PCR SCREEN
MRSA, PCR: NEGATIVE
Staphylococcus aureus: NEGATIVE

## 2012-09-21 NOTE — Progress Notes (Signed)
Urinalysis results results faxed via EPIC to Dr Lequita Halt.

## 2012-09-21 NOTE — Progress Notes (Signed)
Patient states he is unable to get wedding band off.

## 2012-09-21 NOTE — Progress Notes (Signed)
08/28/12 EKg on chart  08/28/12 Stress test results on chart  09/07/12 Clearance note on chart

## 2012-09-21 NOTE — Progress Notes (Signed)
Requested last office visit note from [facility]- Dr Herbie Baltimore.

## 2012-09-21 NOTE — Progress Notes (Signed)
Last office visit note from Dr Herbie Baltimore ( cardiology ) 08/24/12 on chart

## 2012-09-27 NOTE — H&P (Signed)
TOTAL KNEE ADMISSION H&P  Patient is being admitted for left total knee arthroplasty.  Subjective:  Chief Complaint:left knee pain.  HPI: Arthur Lutz, 72 y.o. male, has a history of pain and functional disability in the left knee due to arthritis and has failed non-surgical conservative treatments for greater than 12 weeks to includeNSAID's and/or analgesics, corticosteriod injections, viscosupplementation injections and activity modification.  Onset of symptoms was gradual, starting 5 years ago with gradually worsening course since that time. The patient noted prior procedures on the knee to include  arthroscopy on the left knee(s).  Patient currently rates pain in the left knee(s) at 7 out of 10 with activity. Patient has night pain, worsening of pain with activity and weight bearing, pain that interferes with activities of daily living, pain with passive range of motion, crepitus and joint swelling.  Patient has evidence of periarticular osteophytes, joint subluxation and joint space narrowing by imaging studies.There is no active infection.  Patient Active Problem List   Diagnosis Date Noted  . Portacath in place 08/11/2012  . Unstable angina pectoris - increaseing frequency and intensity after initially angina free post PCI 03/06/2012  . CAD (coronary artery disease), NEW 08/07/11 cutting balloon PTCA to 95% ostial diag. and 90%  proximal LCX.  prior cutting balloon to RCA  & ostium of ostial diag., 2009  08/07/2011  . HTN (hypertension) 08/07/2011  . DOE (dyspnea on exertion) 08/07/2011  . Exertional angina 08/07/2011  . Diabetes mellitus 08/07/2011  . Hyperlipemia, intolerant to statins. 08/07/2011  . Arthritis of knee, left, needs total Knee in future with Dr. Lequita Halt  08/07/2011  . Dyspnea 05/02/2011   Past Medical History  Diagnosis Date  . Hypertension   . Diabetes mellitus   . Allergic rhinitis   . CAD (coronary artery disease)   . CAD (coronary artery disease), NEW 08/07/11  cutting balloon PTCA to 95% ostial diag. and 90%  proximal LCX.  prior cutting balloon and to RCA  & ostium of ostial diag.  08/07/2011  . HTN (hypertension) 08/07/2011  . High cholesterol   . Angina   . Heart attack ~ 2009    "mild; did not affect the heart muscle"  . Shortness of breath on exertion   . Arthritis   . 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 Melanoma of skin ~ 2005    "level 2; on my back"  . Bladder cancer 11/2005  . DOE (dyspnea on exertion) 08/07/2011  . Exertional angina 08/07/2011  . Hyperlipemia, intolerant to statins. 08/07/2011  . Arthritis of knee, left, needs total Knee in future with Dr. Lequita Halt  08/07/2011  . Portacath in place 08/11/2012  . Heart murmur     Past Surgical History  Procedure Laterality Date  . Total knee arthroplasty  11/2003    right  . Coronary angioplasty with stent placement  ~ 2009    "!"  . Coronary angioplasty with stent placement  08/07/11    "1"  . Inguinal hernia repair  ~ 1970    left  . Tonsillectomy      "as a child"  . Cataract extraction w/ intraocular lens implant  ~ 2010    right  . Partial knee arthroplasty  06/2003    right  . Knee arthroscopy  02/20/11    left  . Cold cup removal bladder lesion  11/2005    "malignant"  Current outpatient prescriptions: amLODipine (NORVASC) 5 MG tablet, Take 1 tablet by mouth daily after breakfast. , Disp: , Rfl: ;  aspirin 81 MG tablet, Take 81 mg by mouth daily. , Disp: , Rfl: ;   clopidogrel (PLAVIX) 75 MG tablet, Take 75 mg by mouth daily after breakfast. , Disp: , Rfl: ;  diphenhydrAMINE (BENADRYL) 25 mg capsule, Take 25 mg by mouth at bedtime as needed for sleep., Disp: , Rfl:  ibuprofen (ADVIL,MOTRIN) 100 MG tablet, Take 100 mg by mouth every 6 (six) hours as needed for fever., Disp: , Rfl: ;   isosorbide mononitrate (IMDUR) 30 MG 24 hr tablet, Take 1 tablet by mouth daily after breakfast. , Disp: , Rfl: ;    losartan-hydrochlorothiazide (HYZAAR) 50-12.5 MG per tablet, Take 1 tablet by mouth daily after breakfast., Disp: , Rfl:  metFORMIN (GLUCOPHAGE) 500 MG tablet, Take 500 mg by mouth 2 (two) times daily with a meal., Disp: , Rfl: ;   metoprolol succinate (TOPROL-XL) 25 MG 24 hr tablet, Take 12.5 mg by mouth daily after breakfast., Disp: , Rfl: ;   triazolam (HALCION) 0.25 MG tablet, Take 0.25 mg by mouth at bedtime as needed (sleep)., Disp: , Rfl: ;   Ubiquinol 300 MG CAPS, Take 1 capsule by mouth daily., Disp: , Rfl:   Allergies  Allergen Reactions  . Statins Other (See Comments)    myalgias     History  Substance Use Topics  . Smoking status: Former Smoker -- 2.00 packs/day for 25 years    Types: Cigarettes    Quit date: 07/29/1980  . Smokeless tobacco: Never Used  . Alcohol Use: 1.1 oz/week    1 Glasses of wine, 1 Drinks containing 0.5 oz of alcohol per week     Comment: 08/07/11 "wine or whiskey"    Family History  Problem Relation Age of Onset  . Emphysema Father   . Lung cancer Paternal Uncle     x 2 smokers     Review of Systems  Constitutional: Negative.   HENT: Negative.  Negative for neck pain.        Wears partial upper dentures  Eyes: Negative.   Respiratory: Positive for cough and sputum production. Negative for hemoptysis, shortness of breath and wheezing.        SOB on exertion  Cardiovascular: Negative.   Gastrointestinal: Negative.   Musculoskeletal: Positive for joint pain. Negative for myalgias, back pain and falls.  Skin: Negative.   Neurological: Negative.   Endo/Heme/Allergies: Negative.   Psychiatric/Behavioral: Negative.     Objective:  Physical Exam  Constitutional: He is oriented to person, place, and time. He appears well-developed and well-nourished. No distress.  HENT:  Head: Normocephalic and atraumatic.  Right Ear: External ear normal.  Left Ear: External ear normal.  Nose: Nose normal.  Mouth/Throat: Oropharynx is clear and moist.   Eyes: Conjunctivae and EOM are normal.  Neck: Normal range of motion. Neck supple. No tracheal deviation present. No thyromegaly present.  Cardiovascular: Normal rate, regular rhythm, normal heart sounds and intact distal pulses.   No murmur heard. Respiratory: Effort normal and breath sounds normal. No respiratory distress. He has no wheezes. He exhibits no tenderness.  GI: Soft. Bowel sounds are normal. He exhibits no distension and no mass. There is no tenderness.  Musculoskeletal:       Right hip: Normal.       Left hip: Normal.       Right knee: He exhibits decreased range of motion. He  exhibits no effusion.       Left knee: He exhibits decreased range of motion and effusion. He exhibits no erythema. Tenderness found. Medial joint line and lateral joint line tenderness noted.       Right lower leg: He exhibits no tenderness and no swelling.       Left lower leg: He exhibits no tenderness and no swelling.  Evaluation of his left knee shows a large effusion. He also has a Baker's cyst. His range of motion is 5 to 120 with marked crepitus on range of motion, tenderness medial greater than lateral with no instability noted. His right knee has no effusion. His range is about 5 to 110 with slight laxity and no crepitus.  Lymphadenopathy:    He has no cervical adenopathy.  Neurological: He is alert and oriented to person, place, and time. He has normal strength and normal reflexes. No sensory deficit.  Skin: No rash noted. He is not diaphoretic. No erythema.  Psychiatric: He has a normal mood and affect. His behavior is normal.    Pulse: 77 (Regular) BP: 154/94 (Sitting, Left Arm, Standard)   Estimated body mass index is 34.05 kg/(m^2) as calculated from the following:   Height as of 08/07/11: 6' (1.829 m).   Weight as of 08/07/11: 113.9 kg (251 lb 1.7 oz).   Imaging Review Plain radiographs demonstrate severe degenerative joint disease of the left knee(s). The overall alignment  issignificant varus. The bone quality appears to be good for age and reported activity level.  Assessment/Plan:  End stage arthritis, left knee   The patient history, physical examination, clinical judgment of the provider and imaging studies are consistent with end stage degenerative joint disease of the left knee(s) and total knee arthroplasty is deemed medically necessary. The treatment options including medical management, injection therapy arthroscopy and arthroplasty were discussed at length. The risks and benefits of total knee arthroplasty were presented and reviewed. The risks due to aseptic loosening, infection, stiffness, patella tracking problems, thromboembolic complications and other imponderables were discussed. The patient acknowledged the explanation, agreed to proceed with the plan and consent was signed. Patient is being admitted for inpatient treatment for surgery, pain control, PT, OT, prophylactic antibiotics, VTE prophylaxis, progressive ambulation and ADL's and discharge planning. The patient is planning to be discharged home with home health services      Dangelo Guzzetta,PA-C

## 2012-09-28 ENCOUNTER — Inpatient Hospital Stay (HOSPITAL_COMMUNITY)
Admission: RE | Admit: 2012-09-28 | Discharge: 2012-10-01 | DRG: 470 | Disposition: A | Discharge status: Skilled Nursing Facility | Payer: Medicare Other | Source: Ambulatory Visit | Attending: Orthopedic Surgery | Admitting: Orthopedic Surgery

## 2012-09-28 ENCOUNTER — Encounter (HOSPITAL_COMMUNITY): Payer: Self-pay | Admitting: *Deleted

## 2012-09-28 ENCOUNTER — Encounter (HOSPITAL_COMMUNITY)
Admission: RE | Disposition: A | Discharge status: Skilled Nursing Facility | Payer: Self-pay | Source: Ambulatory Visit | Attending: Orthopedic Surgery

## 2012-09-28 ENCOUNTER — Inpatient Hospital Stay (HOSPITAL_COMMUNITY): Payer: Medicare Other | Admitting: Anesthesiology

## 2012-09-28 ENCOUNTER — Encounter (HOSPITAL_COMMUNITY): Payer: Self-pay | Admitting: Anesthesiology

## 2012-09-28 DIAGNOSIS — M1710 Unilateral primary osteoarthritis, unspecified knee: Secondary | ICD-10-CM

## 2012-09-28 DIAGNOSIS — E119 Type 2 diabetes mellitus without complications: Secondary | ICD-10-CM | POA: Diagnosis present

## 2012-09-28 DIAGNOSIS — Z01812 Encounter for preprocedural laboratory examination: Secondary | ICD-10-CM

## 2012-09-28 DIAGNOSIS — D62 Acute posthemorrhagic anemia: Secondary | ICD-10-CM | POA: Diagnosis not present

## 2012-09-28 DIAGNOSIS — Z8673 Personal history of transient ischemic attack (TIA), and cerebral infarction without residual deficits: Secondary | ICD-10-CM

## 2012-09-28 DIAGNOSIS — I10 Essential (primary) hypertension: Secondary | ICD-10-CM | POA: Diagnosis present

## 2012-09-28 DIAGNOSIS — Z96652 Presence of left artificial knee joint: Secondary | ICD-10-CM

## 2012-09-28 DIAGNOSIS — I2510 Atherosclerotic heart disease of native coronary artery without angina pectoris: Secondary | ICD-10-CM | POA: Diagnosis present

## 2012-09-28 DIAGNOSIS — I252 Old myocardial infarction: Secondary | ICD-10-CM

## 2012-09-28 HISTORY — PX: TOTAL KNEE ARTHROPLASTY: SHX125

## 2012-09-28 LAB — GLUCOSE, CAPILLARY
Glucose-Capillary: 121 mg/dL — ABNORMAL HIGH (ref 70–99)
Glucose-Capillary: 121 mg/dL — ABNORMAL HIGH (ref 70–99)
Glucose-Capillary: 125 mg/dL — ABNORMAL HIGH (ref 70–99)
Glucose-Capillary: 163 mg/dL — ABNORMAL HIGH (ref 70–99)

## 2012-09-28 LAB — TYPE AND SCREEN: Antibody Screen: NEGATIVE

## 2012-09-28 SURGERY — ARTHROPLASTY, KNEE, TOTAL
Anesthesia: General | Site: Knee | Laterality: Left | Wound class: Clean

## 2012-09-28 MED ORDER — FENTANYL CITRATE 0.05 MG/ML IJ SOLN
INTRAMUSCULAR | Status: DC | PRN
  Administered 2012-09-28 (×3): 50 ug via INTRAVENOUS
  Administered 2012-09-28: 100 ug via INTRAVENOUS

## 2012-09-28 MED ORDER — CHLORHEXIDINE GLUCONATE 4 % EX LIQD
60.0000 mL | Freq: Once | CUTANEOUS | Status: DC
  Filled 2012-09-28: qty 60

## 2012-09-28 MED ORDER — POLYETHYLENE GLYCOL 3350 17 G PO PACK: 17.0000 g | PACK | Freq: Every day | ORAL | Status: DC | PRN

## 2012-09-28 MED ORDER — LOSARTAN POTASSIUM-HCTZ 50-12.5 MG PO TABS: 1.0000 | ORAL_TABLET | Freq: Every day | ORAL | Status: DC

## 2012-09-28 MED ORDER — ZOLPIDEM TARTRATE 5 MG PO TABS: 5.0000 mg | ORAL_TABLET | Freq: Every evening | ORAL | Status: DC | PRN

## 2012-09-28 MED ORDER — BUPIVACAINE LIPOSOME 1.3 % IJ SUSP
20.0000 mL | Freq: Once | INTRAMUSCULAR | Status: AC
  Administered 2012-09-28: 20 mL
  Filled 2012-09-28: qty 20

## 2012-09-28 MED ORDER — ACETAMINOPHEN 325 MG PO TABS
650.0000 mg | ORAL_TABLET | Freq: Four times a day (QID) | ORAL | Status: DC | PRN
  Administered 2012-09-30: 650 mg via ORAL
  Filled 2012-09-28: qty 2

## 2012-09-28 MED ORDER — ONDANSETRON HCL 4 MG PO TABS: 4.0000 mg | ORAL_TABLET | Freq: Four times a day (QID) | ORAL | Status: DC | PRN

## 2012-09-28 MED ORDER — STERILE WATER FOR IRRIGATION IR SOLN
Status: DC | PRN
  Administered 2012-09-28: 3000 mL

## 2012-09-28 MED ORDER — TRIAZOLAM 0.25 MG PO TABS: 0.2500 mg | ORAL_TABLET | Freq: Every evening | ORAL | Status: DC | PRN

## 2012-09-28 MED ORDER — EPHEDRINE SULFATE 50 MG/ML IJ SOLN
INTRAMUSCULAR | Status: DC | PRN
  Administered 2012-09-28 (×2): 5 mg via INTRAVENOUS

## 2012-09-28 MED ORDER — METHOCARBAMOL 500 MG PO TABS
500.0000 mg | ORAL_TABLET | Freq: Four times a day (QID) | ORAL | Status: DC | PRN
  Administered 2012-09-28 – 2012-09-30 (×3): 500 mg via ORAL
  Filled 2012-09-28 (×3): qty 1

## 2012-09-28 MED ORDER — INSULIN ASPART 100 UNIT/ML ~~LOC~~ SOLN
0.0000 [IU] | Freq: Three times a day (TID) | SUBCUTANEOUS | Status: DC
  Administered 2012-09-28: 2 [IU] via SUBCUTANEOUS
  Administered 2012-09-30: 3 [IU] via SUBCUTANEOUS
  Administered 2012-09-30 (×3): 2 [IU] via SUBCUTANEOUS

## 2012-09-28 MED ORDER — MIDAZOLAM HCL 5 MG/5ML IJ SOLN
INTRAMUSCULAR | Status: DC | PRN
  Administered 2012-09-28: 2 mg via INTRAVENOUS

## 2012-09-28 MED ORDER — OXYCODONE HCL 5 MG PO TABS
5.0000 mg | ORAL_TABLET | ORAL | Status: DC | PRN
  Administered 2012-09-28: 10 mg via ORAL
  Administered 2012-09-28: 5 mg via ORAL
  Administered 2012-09-28: 10 mg via ORAL
  Administered 2012-09-29 (×4): 5 mg via ORAL
  Administered 2012-09-30: 10 mg via ORAL
  Administered 2012-09-30: 5 mg via ORAL
  Administered 2012-09-30: 10 mg via ORAL
  Administered 2012-09-30: 5 mg via ORAL
  Administered 2012-10-01: 10 mg via ORAL
  Filled 2012-09-28: qty 1
  Filled 2012-09-28 (×2): qty 2
  Filled 2012-09-28: qty 1
  Filled 2012-09-28: qty 2
  Filled 2012-09-28 (×2): qty 1
  Filled 2012-09-28 (×2): qty 2
  Filled 2012-09-28: qty 1
  Filled 2012-09-28: qty 2

## 2012-09-28 MED ORDER — SUCCINYLCHOLINE CHLORIDE 20 MG/ML IJ SOLN
INTRAMUSCULAR | Status: DC | PRN
  Administered 2012-09-28: 100 mg via INTRAVENOUS

## 2012-09-28 MED ORDER — METOPROLOL SUCCINATE 12.5 MG HALF TABLET: 12.5000 mg | ORAL_TABLET | Freq: Every day | ORAL | Status: DC

## 2012-09-28 MED ORDER — GLYCOPYRROLATE 0.2 MG/ML IJ SOLN
INTRAMUSCULAR | Status: DC | PRN
  Administered 2012-09-28: 0.2 mg via INTRAVENOUS

## 2012-09-28 MED ORDER — LACTATED RINGERS IV SOLN
INTRAVENOUS | Status: DC | PRN
  Administered 2012-09-28 (×2): via INTRAVENOUS

## 2012-09-28 MED ORDER — ISOSORBIDE MONONITRATE ER 30 MG PO TB24
30.0000 mg | ORAL_TABLET | Freq: Every day | ORAL | Status: DC
  Administered 2012-09-29 – 2012-10-01 (×3): 30 mg via ORAL
  Filled 2012-09-28 (×4): qty 1

## 2012-09-28 MED ORDER — PHENOL 1.4 % MT LIQD
1.0000 | OROMUCOSAL | Status: DC | PRN
  Filled 2012-09-28: qty 177

## 2012-09-28 MED ORDER — DEXAMETHASONE SODIUM PHOSPHATE 10 MG/ML IJ SOLN: 10.0000 mg | Freq: Once | INTRAMUSCULAR | Status: AC

## 2012-09-28 MED ORDER — HYDROMORPHONE HCL PF 1 MG/ML IJ SOLN
0.2500 mg | INTRAMUSCULAR | Status: DC | PRN
  Administered 2012-09-28 (×4): 0.5 mg via INTRAVENOUS

## 2012-09-28 MED ORDER — METOCLOPRAMIDE HCL 10 MG PO TABS: 5.0000 mg | ORAL_TABLET | Freq: Three times a day (TID) | ORAL | Status: DC | PRN

## 2012-09-28 MED ORDER — LIDOCAINE HCL (CARDIAC) 20 MG/ML IV SOLN
INTRAVENOUS | Status: DC | PRN
  Administered 2012-09-28: 100 mg via INTRAVENOUS

## 2012-09-28 MED ORDER — 0.9 % SODIUM CHLORIDE (POUR BTL) OPTIME
TOPICAL | Status: DC | PRN
  Administered 2012-09-28: 1000 mL

## 2012-09-28 MED ORDER — MENTHOL 3 MG MT LOZG
1.0000 | LOZENGE | OROMUCOSAL | Status: DC | PRN
  Filled 2012-09-28: qty 9

## 2012-09-28 MED ORDER — CEFAZOLIN SODIUM-DEXTROSE 2-3 GM-% IV SOLR
2.0000 g | INTRAVENOUS | Status: AC
  Administered 2012-09-28: 2 g via INTRAVENOUS

## 2012-09-28 MED ORDER — BISACODYL 10 MG RE SUPP: 10.0000 mg | Freq: Every day | RECTAL | Status: DC | PRN

## 2012-09-28 MED ORDER — PROMETHAZINE HCL 25 MG/ML IJ SOLN: 6.2500 mg | INTRAMUSCULAR | Status: DC | PRN

## 2012-09-28 MED ORDER — UBIQUINOL 300 MG PO CAPS: 1.0000 | ORAL_CAPSULE | Freq: Every day | ORAL | Status: DC

## 2012-09-28 MED ORDER — METFORMIN HCL 500 MG PO TABS
500.0000 mg | ORAL_TABLET | Freq: Two times a day (BID) | ORAL | Status: DC
  Administered 2012-09-28 – 2012-09-29 (×2): 500 mg via ORAL
  Filled 2012-09-28 (×4): qty 1

## 2012-09-28 MED ORDER — DOCUSATE SODIUM 100 MG PO CAPS
100.0000 mg | ORAL_CAPSULE | Freq: Two times a day (BID) | ORAL | Status: DC
  Administered 2012-09-28 – 2012-10-01 (×5): 100 mg via ORAL

## 2012-09-28 MED ORDER — LOSARTAN POTASSIUM 50 MG PO TABS
50.0000 mg | ORAL_TABLET | Freq: Every day | ORAL | Status: DC
  Administered 2012-09-29 – 2012-10-01 (×3): 50 mg via ORAL
  Filled 2012-09-28 (×3): qty 1

## 2012-09-28 MED ORDER — MORPHINE SULFATE 2 MG/ML IJ SOLN
1.0000 mg | INTRAMUSCULAR | Status: DC | PRN
  Administered 2012-09-28: 1 mg via INTRAVENOUS
  Administered 2012-09-28: 2 mg via INTRAVENOUS
  Filled 2012-09-28 (×2): qty 1

## 2012-09-28 MED ORDER — AMLODIPINE BESYLATE 5 MG PO TABS
5.0000 mg | ORAL_TABLET | Freq: Every day | ORAL | Status: DC
  Administered 2012-09-29 – 2012-10-01 (×3): 5 mg via ORAL
  Filled 2012-09-28 (×4): qty 1

## 2012-09-28 MED ORDER — CEFAZOLIN SODIUM-DEXTROSE 2-3 GM-% IV SOLR
2.0000 g | Freq: Four times a day (QID) | INTRAVENOUS | Status: AC
  Administered 2012-09-28 (×2): 2 g via INTRAVENOUS
  Filled 2012-09-28 (×2): qty 50

## 2012-09-28 MED ORDER — SODIUM CHLORIDE 0.9 % IJ SOLN
INTRAMUSCULAR | Status: DC | PRN
  Administered 2012-09-28: 50 mL via INTRAVENOUS

## 2012-09-28 MED ORDER — RIVAROXABAN 10 MG PO TABS
10.0000 mg | ORAL_TABLET | Freq: Every day | ORAL | Status: DC
  Administered 2012-09-29 – 2012-09-30 (×2): 10 mg via ORAL
  Filled 2012-09-28 (×4): qty 1

## 2012-09-28 MED ORDER — DEXAMETHASONE 6 MG PO TABS
10.0000 mg | ORAL_TABLET | Freq: Once | ORAL | Status: AC
  Administered 2012-09-29: 10 mg via ORAL
  Filled 2012-09-28: qty 1

## 2012-09-28 MED ORDER — FLEET ENEMA 7-19 GM/118ML RE ENEM: 1.0000 | ENEMA | Freq: Once | RECTAL | Status: AC | PRN

## 2012-09-28 MED ORDER — DIPHENHYDRAMINE HCL 12.5 MG/5ML PO ELIX: 12.5000 mg | ORAL_SOLUTION | ORAL | Status: DC | PRN

## 2012-09-28 MED ORDER — DIPHENHYDRAMINE HCL 25 MG PO CAPS: 25.0000 mg | ORAL_CAPSULE | Freq: Every evening | ORAL | Status: DC | PRN

## 2012-09-28 MED ORDER — HYDROCHLOROTHIAZIDE 12.5 MG PO CAPS
12.5000 mg | ORAL_CAPSULE | Freq: Every day | ORAL | Status: DC
  Administered 2012-09-29 – 2012-10-01 (×3): 12.5 mg via ORAL
  Filled 2012-09-28 (×3): qty 1

## 2012-09-28 MED ORDER — ACETAMINOPHEN 10 MG/ML IV SOLN
1000.0000 mg | Freq: Once | INTRAVENOUS | Status: AC
  Administered 2012-09-28: 1000 mg via INTRAVENOUS

## 2012-09-28 MED ORDER — PROPOFOL 10 MG/ML IV BOLUS
INTRAVENOUS | Status: DC | PRN
  Administered 2012-09-28: 180 mg via INTRAVENOUS

## 2012-09-28 MED ORDER — ONDANSETRON HCL 4 MG/2ML IJ SOLN
4.0000 mg | Freq: Four times a day (QID) | INTRAMUSCULAR | Status: DC | PRN
  Filled 2012-09-28: qty 2

## 2012-09-28 MED ORDER — TRAMADOL HCL 50 MG PO TABS: 50.0000 mg | ORAL_TABLET | Freq: Four times a day (QID) | ORAL | Status: DC | PRN

## 2012-09-28 MED ORDER — SODIUM CHLORIDE 0.9 % IV SOLN: INTRAVENOUS | Status: DC

## 2012-09-28 MED ORDER — ACETAMINOPHEN 650 MG RE SUPP: 650.0000 mg | Freq: Four times a day (QID) | RECTAL | Status: DC | PRN

## 2012-09-28 MED ORDER — ACETAMINOPHEN 10 MG/ML IV SOLN
1000.0000 mg | Freq: Four times a day (QID) | INTRAVENOUS | Status: AC
  Administered 2012-09-28 – 2012-09-29 (×4): 1000 mg via INTRAVENOUS
  Filled 2012-09-28 (×6): qty 100

## 2012-09-28 MED ORDER — SODIUM CHLORIDE 0.9 % IR SOLN
Status: DC | PRN
  Administered 2012-09-28: 1000 mL

## 2012-09-28 MED ORDER — ONDANSETRON HCL 4 MG/2ML IJ SOLN
INTRAMUSCULAR | Status: DC | PRN
  Administered 2012-09-28: 4 mg via INTRAVENOUS

## 2012-09-28 MED ORDER — METOCLOPRAMIDE HCL 5 MG/ML IJ SOLN
5.0000 mg | Freq: Three times a day (TID) | INTRAMUSCULAR | Status: DC | PRN
  Administered 2012-09-28: 10 mg via INTRAVENOUS
  Filled 2012-09-28: qty 2

## 2012-09-28 MED ORDER — METOPROLOL SUCCINATE 12.5 MG HALF TABLET
12.5000 mg | ORAL_TABLET | Freq: Every day | ORAL | Status: DC
  Administered 2012-09-28 – 2012-10-01 (×4): 12.5 mg via ORAL
  Filled 2012-09-28 (×4): qty 1

## 2012-09-28 MED ORDER — POTASSIUM CHLORIDE IN NACL 20-0.9 MEQ/L-% IV SOLN
INTRAVENOUS | Status: DC
  Administered 2012-09-28 – 2012-09-29 (×3): via INTRAVENOUS
  Filled 2012-09-28 (×5): qty 1000

## 2012-09-28 MED ORDER — METHOCARBAMOL 100 MG/ML IJ SOLN: 500.0000 mg | Freq: Four times a day (QID) | INTRAVENOUS | Status: DC | PRN

## 2012-09-28 SURGICAL SUPPLY — 57 items
BAG SPEC THK2 15X12 ZIP CLS (MISCELLANEOUS) ×1
BAG ZIPLOCK 12X15 (MISCELLANEOUS) ×2 IMPLANT
BANDAGE ELASTIC 6 VELCRO ST LF (GAUZE/BANDAGES/DRESSINGS) ×2 IMPLANT
BANDAGE ESMARK 6X9 LF (GAUZE/BANDAGES/DRESSINGS) ×1 IMPLANT
BLADE SAG 18X100X1.27 (BLADE) ×2 IMPLANT
BLADE SAW SGTL 11.0X1.19X90.0M (BLADE) ×2 IMPLANT
BNDG CMPR 9X6 STRL LF SNTH (GAUZE/BANDAGES/DRESSINGS) ×1
BNDG ESMARK 6X9 LF (GAUZE/BANDAGES/DRESSINGS) ×2
BOWL SMART MIX CTS (DISPOSABLE) ×2 IMPLANT
CEMENT HV SMART SET (Cement) ×4 IMPLANT
CLOTH BEACON ORANGE TIMEOUT ST (SAFETY) ×2 IMPLANT
CLSR STERI-STRIP ANTIMIC 1/2X4 (GAUZE/BANDAGES/DRESSINGS) ×1 IMPLANT
CO AXIAL FAN SPRAY TIP SOFT SH (MISCELLANEOUS) IMPLANT
CUFF TOURN SGL QUICK 34 (TOURNIQUET CUFF) ×2
CUFF TRNQT CYL 34X4X40X1 (TOURNIQUET CUFF) ×1 IMPLANT
DRAPE EXTREMITY T 121X128X90 (DRAPE) ×2 IMPLANT
DRAPE POUCH INSTRU U-SHP 10X18 (DRAPES) ×2 IMPLANT
DRAPE U-SHAPE 47X51 STRL (DRAPES) ×2 IMPLANT
DRSG ADAPTIC 3X8 NADH LF (GAUZE/BANDAGES/DRESSINGS) ×2 IMPLANT
DRSG PAD ABDOMINAL 8X10 ST (GAUZE/BANDAGES/DRESSINGS) ×1 IMPLANT
DURAPREP 26ML APPLICATOR (WOUND CARE) ×2 IMPLANT
ELECT REM PT RETURN 9FT ADLT (ELECTROSURGICAL) ×2
ELECTRODE REM PT RTRN 9FT ADLT (ELECTROSURGICAL) ×1 IMPLANT
EVACUATOR 1/8 PVC DRAIN (DRAIN) ×2 IMPLANT
FACESHIELD LNG OPTICON STERILE (SAFETY) ×10 IMPLANT
GLOVE BIO SURGEON STRL SZ7.5 (GLOVE) ×2 IMPLANT
GLOVE BIO SURGEON STRL SZ8 (GLOVE) ×2 IMPLANT
GLOVE BIOGEL PI IND STRL 8 (GLOVE) ×2 IMPLANT
GLOVE BIOGEL PI INDICATOR 8 (GLOVE) ×2
GLOVE SURG SS PI 6.5 STRL IVOR (GLOVE) ×4 IMPLANT
GOWN STRL NON-REIN LRG LVL3 (GOWN DISPOSABLE) ×4 IMPLANT
GOWN STRL REIN XL XLG (GOWN DISPOSABLE) ×2 IMPLANT
HANDPIECE INTERPULSE COAX TIP (DISPOSABLE) ×2
IMMOBILIZER KNEE 20 (SOFTGOODS) ×2
IMMOBILIZER KNEE 20 THIGH 36 (SOFTGOODS) ×1 IMPLANT
KIT BASIN OR (CUSTOM PROCEDURE TRAY) ×2 IMPLANT
MANIFOLD NEPTUNE II (INSTRUMENTS) ×2 IMPLANT
NDL SAFETY ECLIPSE 18X1.5 (NEEDLE) ×1 IMPLANT
NEEDLE HYPO 18GX1.5 SHARP (NEEDLE) ×2
NS IRRIG 1000ML POUR BTL (IV SOLUTION) ×2 IMPLANT
PACK TOTAL JOINT (CUSTOM PROCEDURE TRAY) ×2 IMPLANT
PAD ABD 7.5X8 STRL (GAUZE/BANDAGES/DRESSINGS) ×2 IMPLANT
PADDING CAST COTTON 6X4 STRL (CAST SUPPLIES) ×4 IMPLANT
POSITIONER SURGICAL ARM (MISCELLANEOUS) ×2 IMPLANT
SET HNDPC FAN SPRY TIP SCT (DISPOSABLE) ×1 IMPLANT
SPONGE GAUZE 4X4 12PLY (GAUZE/BANDAGES/DRESSINGS) ×2 IMPLANT
STRIP CLOSURE SKIN 1/2X4 (GAUZE/BANDAGES/DRESSINGS) ×4 IMPLANT
SUCTION FRAZIER 12FR DISP (SUCTIONS) ×2 IMPLANT
SUT MNCRL AB 4-0 PS2 18 (SUTURE) ×2 IMPLANT
SUT VIC AB 2-0 CT1 27 (SUTURE) ×6
SUT VIC AB 2-0 CT1 TAPERPNT 27 (SUTURE) ×3 IMPLANT
SUT VLOC 180 0 24IN GS25 (SUTURE) ×2 IMPLANT
SYR 50ML LL SCALE MARK (SYRINGE) ×2 IMPLANT
TOWEL OR 17X26 10 PK STRL BLUE (TOWEL DISPOSABLE) ×4 IMPLANT
TRAY FOLEY CATH 14FRSI W/METER (CATHETERS) ×2 IMPLANT
WATER STERILE IRR 1500ML POUR (IV SOLUTION) ×1 IMPLANT
WRAP KNEE MAXI GEL POST OP (GAUZE/BANDAGES/DRESSINGS) ×4 IMPLANT

## 2012-09-28 NOTE — Op Note (Signed)
Pre-operative diagnosis- Osteoarthritis  Left knee(s)  Post-operative diagnosis- Osteoarthritis Left knee(s)  Procedure-  Left  Total Knee Arthroplasty  Surgeon- Gus Rankin. Aluisio, MD  Assistant- Dimitri Ped, PA-C   Anesthesia-  General EBL-* No blood loss amount entered *  Drains Hemovac  Tourniquet time-  Total Tourniquet Time Documented: area (laterality) - 43 minutes Total: area (laterality) - 43 minutes    Complications- None  Condition-PACU - hemodynamically stable.   Brief Clinical Note   Arthur Lutz is a 71 y.o. year old male with end stage OA of his left knee with progressively worsening pain and dysfunction. He has constant pain, with activity and at rest and significant functional deficits with difficulties even with ADLs. He has had extensive non-op management including analgesics, injections of cortisone and viscosupplements, and home exercise program, but remains in significant pain with significant dysfunction. Radiographs show bone on bone arthritis medial and patellofemoral with varus deformity. He presents now for left Total Knee Arthroplasty.     Procedure in detail---   The patient is brought into the operating room and positioned supine on the operating table. After successful administration of  General,   a tourniquet is placed high on the  Left thigh(s) and the lower extremity is prepped and draped in the usual sterile fashion. Time out is performed by the operating team and then the Left lower extremity is wrapped in Esmarch, knee flexed and the tourniquet inflated to 300 mmHg.       A midline incision is made with a ten blade through the subcutaneous tissue to the level of the extensor mechanism. A fresh blade is used to make a medial parapatellar arthrotomy. Soft tissue over the proximal medial tibia is subperiosteally elevated to the joint line with a knife and into the semimembranosus bursa with a Cobb elevator. Soft tissue over the proximal lateral  tibia is elevated with attention being paid to avoiding the patellar tendon on the tibial tubercle. The patella is everted, knee flexed 90 degrees and the ACL and PCL are removed. Findings are bone on bone medial and patellofemoral with large medial osteophytes.        The drill is used to create a starting hole in the distal femur and the canal is thoroughly irrigated with sterile saline to remove the fatty contents. The 5 degree Left  valgus alignment guide is placed into the femoral canal and the distal femoral cutting block is pinned to remove 10 mm off the distal femur. Resection is made with an oscillating saw.      The tibia is subluxed forward and the menisci are removed. The extramedullary alignment guide is placed referencing proximally at the medial aspect of the tibial tubercle and distally along the second metatarsal axis and tibial crest. The block is pinned to remove 2mm off the more deficient medial  side. Resection is made with an oscillating saw. Size 4is the most appropriate size for the tibia and the proximal tibia is prepared with the modular drill and keel punch for that size.      The femoral sizing guide is placed and size 5 is most appropriate. Rotation is marked off the epicondylar axis and confirmed by creating a rectangular flexion gap at 90 degrees. The size 5 cutting block is pinned in this rotation and the anterior, posterior and chamfer cuts are made with the oscillating saw. The intercondylar block is then placed and that cut is made.      Trial size 4 tibial  component, trial size 5 posterior stabilized femur and a 10  mm posterior stabilized rotating platform insert trial is placed. Full extension is achieved with excellent varus/valgus and anterior/posterior balance throughout full range of motion. The patella is everted and thickness measured to be 27  mm. Free hand resection is taken to 15 mm, a 41 template is placed, lug holes are drilled, trial patella is placed, and it  tracks normally. Osteophytes are removed off the posterior femur with the trial in place. All trials are removed and the cut bone surfaces prepared with pulsatile lavage. Cement is mixed and once ready for implantation, the size 4 tibial implant, size  5 posterior stabilized femoral component, and the size 41 patella are cemented in place and the patella is held with the clamp. The trial insert is placed and the knee held in full extension. The Exparel (20 ml mixed with 50 ml saline) is injected into the extensor mechanism, posterior capsule, medial and lateral gutters and subcutaneous tissues.  All extruded cement is removed and once the cement is hard the permanent 10 mm posterior stabilized rotating platform insert is placed into the tibial tray.      The wound is copiously irrigated with saline solution and the extensor mechanism closed over a hemovac drain with #1 PDS suture. The tourniquet is released for a total tourniquet time of 43  minutes. Flexion against gravity is 140 degrees and the patella tracks normally. Subcutaneous tissue is closed with 2.0 vicryl and subcuticular with running 4.0 Monocryl. The incision is cleaned and dried and steri-strips and a bulky sterile dressing are applied. The limb is placed into a knee immobilizer and the patient is awakened and transported to recovery in stable condition.      Please note that a surgical assistant was a medical necessity for this procedure in order to perform it in a safe and expeditious manner. Surgical assistant was necessary to retract the ligaments and vital neurovascular structures to prevent injury to them and also necessary for proper positioning of the limb to allow for anatomic placement of the prosthesis.   Gus Rankin Aluisio, MD    09/28/2012, 10:10 AM

## 2012-09-28 NOTE — Anesthesia Preprocedure Evaluation (Addendum)
Anesthesia Evaluation  Patient identified by MRN, date of birth, ID band Patient awake    Reviewed: Allergy & Precautions, H&P , NPO status , Patient's Chart, lab work & pertinent test results  Airway Mallampati: II TM Distance: >3 FB Neck ROM: Full    Dental no notable dental hx.    Pulmonary shortness of breath,  breath sounds clear to auscultation  Pulmonary exam normal       Cardiovascular hypertension, Pt. on medications and Pt. on home beta blockers + angina + CAD, + Past MI and + DOE + Valvular Problems/Murmurs Rhythm:Regular Rate:Normal  Low risk nuclear study 08/29/12  ECG and Cxr reviewed.   Neuro/Psych CVA negative psych ROS   GI/Hepatic negative GI ROS, Neg liver ROS,   Endo/Other  diabetes, Type 2, Oral Hypoglycemic Agents  Renal/GU negative Renal ROS  negative genitourinary   Musculoskeletal negative musculoskeletal ROS (+)   Abdominal   Peds negative pediatric ROS (+)  Hematology negative hematology ROS (+)   Anesthesia Other Findings   Reproductive/Obstetrics negative OB ROS                          Anesthesia Physical Anesthesia Plan  ASA: III  Anesthesia Plan: General   Post-op Pain Management:    Induction: Intravenous  Airway Management Planned: Oral ETT  Additional Equipment:   Intra-op Plan:   Post-operative Plan: Extubation in OR  Informed Consent: I have reviewed the patients History and Physical, chart, labs and discussed the procedure including the risks, benefits and alternatives for the proposed anesthesia with the patient or authorized representative who has indicated his/her understanding and acceptance.   Dental advisory given  Plan Discussed with: CRNA  Anesthesia Plan Comments: (Off plavix 3-4 days. Plan general.)       Anesthesia Quick Evaluation

## 2012-09-28 NOTE — Interval H&P Note (Signed)
History and Physical Interval Note:  09/28/2012 6:54 AM  Arthur Lutz  has presented today for surgery, with the diagnosis of Osteoarthritis of the Left Knee  The various methods of treatment have been discussed with the patient and family. After consideration of risks, benefits and other options for treatment, the patient has consented to  Procedure(s): TOTAL KNEE ARTHROPLASTY (Left) as a surgical intervention .  The patient's history has been reviewed, patient examined, no change in status, stable for surgery.  I have reviewed the patient's chart and labs.  Questions were answered to the patient's satisfaction.     Loanne Drilling

## 2012-09-28 NOTE — Transfer of Care (Signed)
Immediate Anesthesia Transfer of Care Note  Patient: Arthur Lutz  Procedure(s) Performed: Procedure(s): TOTAL KNEE ARTHROPLASTY (Left)  Patient Location: PACU  Anesthesia Type:General  Level of Consciousness: sedated  Airway & Oxygen Therapy: Patient Spontanous Breathing and Patient connected to face mask oxygen  Post-op Assessment: Report given to PACU RN and Post -op Vital signs reviewed and stable  Post vital signs: Reviewed and stable  Complications: No apparent anesthesia complications

## 2012-09-28 NOTE — Anesthesia Postprocedure Evaluation (Signed)
  Anesthesia Post-op Note  Patient: Arthur Lutz  Procedure(s) Performed: Procedure(s) (LRB): TOTAL KNEE ARTHROPLASTY (Left)  Patient Location: PACU  Anesthesia Type: General  Level of Consciousness: awake and alert   Airway and Oxygen Therapy: Patient Spontanous Breathing  Post-op Pain: mild  Post-op Assessment: Post-op Vital signs reviewed, Patient's Cardiovascular Status Stable, Respiratory Function Stable, Patent Airway and No signs of Nausea or vomiting  Last Vitals:  Filed Vitals:   09/28/12 1045  BP:   Pulse:   Temp: 36.4 C  Resp:     Post-op Vital Signs: stable   Complications: No apparent anesthesia complications

## 2012-09-28 NOTE — Plan of Care (Signed)
Problem: Consults Goal: Diagnosis- Total Joint Replacement Left total knee     

## 2012-09-28 NOTE — Progress Notes (Signed)
PT Cancellation Note  Patient Details Name: Arthur Lutz MRN: [id number] DOB: 1941/06/20   Cancelled Treatment:    Reason Eval/Treat Not Completed: Pain limiting ability to participate.  Will check back in am.  Pt states he prefers being seen early.   Thanks,    Vista Deck 09/28/2012, 4:51 PM

## 2012-09-28 NOTE — Progress Notes (Signed)
Utilization review completed.  

## 2012-09-28 NOTE — Anesthesia Procedure Notes (Signed)
Procedure Name: Intubation Date/Time: 09/28/2012 8:58 AM Performed by: Doran Clay Pre-anesthesia Checklist: Patient identified, Timeout performed, Emergency Drugs available, Suction available and Patient being monitored Patient Re-evaluated:Patient Re-evaluated prior to inductionOxygen Delivery Method: Circle system utilized Preoxygenation: Pre-oxygenation with 100% oxygen Intubation Type: IV induction Ventilation: Mask ventilation without difficulty Laryngoscope Size: Mac and 4 Grade View: Grade III Tube type: Oral Tube size: 8.0 mm Number of attempts: 2 Airway Equipment and Method: Stylet and Video-laryngoscopy Placement Confirmation: ETT inserted through vocal cords under direct vision,  breath sounds checked- equal and bilateral and positive ETCO2 Secured at: 23 cm Tube secured with: Tape Dental Injury: Teeth and Oropharynx as per pre-operative assessment  Future Recommendations: Recommend- induction with short-acting agent, and alternative techniques readily available

## 2012-09-29 ENCOUNTER — Encounter (HOSPITAL_COMMUNITY): Payer: Self-pay | Admitting: Orthopedic Surgery

## 2012-09-29 DIAGNOSIS — D62 Acute posthemorrhagic anemia: Secondary | ICD-10-CM | POA: Diagnosis not present

## 2012-09-29 LAB — CBC
MCH: 31.5 pg (ref 26.0–34.0)
MCV: 94.4 fL (ref 78.0–100.0)
Platelets: 169 10*3/uL (ref 150–400)
RBC: 3.55 MIL/uL — ABNORMAL LOW (ref 4.22–5.81)
RDW: 13.5 % (ref 11.5–15.5)

## 2012-09-29 LAB — BASIC METABOLIC PANEL
CO2: 29 mEq/L (ref 19–32)
Calcium: 8.2 mg/dL — ABNORMAL LOW (ref 8.4–10.5)
Creatinine, Ser: 0.9 mg/dL (ref 0.50–1.35)
GFR calc Af Amer: >90 mL/min (ref >90)
GFR calc non Af Amer: 85 mL/min — ABNORMAL LOW (ref >90)
Sodium: 136 mEq/L (ref 135–145)

## 2012-09-29 LAB — GLUCOSE, CAPILLARY: Glucose-Capillary: 133 mg/dL — ABNORMAL HIGH (ref 70–99)

## 2012-09-29 NOTE — Evaluation (Signed)
Physical Therapy Evaluation Patient Details Name: Arthur Lutz MRN: [id number] DOB: 31-Oct-1940 Today's Date: 09/29/2012 Time: 1030-1051 PT Time Calculation (min): 21 min  PT Assessment / Plan / Recommendation Clinical Impression  Pt. is 72 yo male admitted 3/3 for LTKA. Pt. is very sleepy, had difficulty ambulating, decreased knee extension. Pt. will benefit from PT to improve rom, functional mobility to DC to home. Pt has to have revision on R/    PT Assessment  Patient needs continued PT services    Follow Up Recommendations  Home health PT    Does the patient have the potential to tolerate intense rehabilitation      Barriers to Discharge        Equipment Recommendations  Rolling walker with 5" wheels    Recommendations for Other Services     Frequency 7X/week    Precautions / Restrictions Precautions Precautions: Knee Required Braces or Orthoses: Knee Immobilizer - Left Restrictions Weight Bearing Restrictions: No   Pertinent Vitals/Pain 10 knee pain, has been medicated.      Mobility  Bed Mobility Bed Mobility: Supine to Sit;Sitting - Scoot to Edge of Bed Supine to Sit: 4: Min assist;HOB elevated;Other (comment) Sitting - Scoot to Edge of Bed: 4: Min guard;4: Min assist;Other (comment) Transfers Sit to Stand: 4: Min assist;From bed;From chair/3-in-1 Stand to Sit: 4: Min assist;To chair/3-in-1 Ambulation/Gait Ambulation/Gait Assistance: 1: +2 Total assist Ambulation/Gait: Patient Percentage: 70% Ambulation Distance (Feet): 8 Feet Assistive device: Rolling walker Ambulation/Gait Assistance Details: cues for incr. weight on LLE, cues for turns. Gait Pattern: Antalgic;Step-to pattern;Decreased step length - left;Decreased stance time - left    Exercises     PT Diagnosis: Difficulty walking;Acute pain  PT Problem List: Decreased strength;Decreased range of motion;Decreased activity tolerance;Decreased mobility;Pain;Decreased knowledge of use of DME;Decreased  knowledge of precautions PT Treatment Interventions: DME instruction;Gait training;Stair training;Functional mobility training;Therapeutic activities;Therapeutic exercise;Patient/family education   PT Goals Acute Rehab PT Goals PT Goal Formulation: With patient/family Time For Goal Achievement: 10/06/12 Potential to Achieve Goals: Good Pt will go Supine/Side to Sit: with supervision PT Goal: Supine/Side to Sit - Progress: Goal set today Pt will go Sit to Supine/Side: with supervision PT Goal: Sit to Supine/Side - Progress: Goal set today Pt will go Sit to Stand: with supervision PT Goal: Sit to Stand - Progress: Goal set today Pt will go Stand to Sit: with supervision PT Goal: Stand to Sit - Progress: Goal set today Pt will Ambulate: 51 - 150 feet;with supervision;with rolling walker PT Goal: Ambulate - Progress: Goal set today Pt will Go Up / Down Stairs: 3-5 stairs;with min assist;with least restrictive assistive device PT Goal: Up/Down Stairs - Progress: Goal set today Pt will Perform Home Exercise Program: with supervision, verbal cues required/provided PT Goal: Perform Home Exercise Program - Progress: Goal set today  Visit Information  Last PT Received On: 09/29/12 Assistance Needed: +1    Subjective Data  Subjective: I have gotten no sleep. Patient Stated Goal:  I want to get this over.   Prior Functioning  Home Living Lives With: Spouse Available Help at Discharge: Other (Comment) Type of Home: House Home Layout: Two level;Able to live on main level with bedroom/bathroom;1/2 bath on main level Alternate Level Stairs-Number of Steps: 5 STE  Alternate Level Stairs-Rails: Right Bathroom Shower/Tub: Tub/shower unit;Curtain Bathroom Toilet: Standard Bathroom Accessibility: Yes How Accessible: Accessible via walker Home Adaptive Equipment: Straight cane Prior Function Level of Independence: Independent Able to Take Stairs?: Yes Driving: Yes Vocation: Full time  employment Communication Communication: No difficulties Dominant Hand: Right    Cognition  Cognition Overall Cognitive Status: Appears within functional limits for tasks assessed/performed Arousal/Alertness: Lethargic Orientation Level: Appears intact for tasks assessed Behavior During Session: [facility] for tasks performed Cognition - Other Comments: Pt states that he is lethargic secondary to "not sleeping last night"     Extremity/Trunk Assessment Right Upper Extremity Assessment RUE ROM/Strength/Tone: Within functional levels;WFL for tasks assessed (Pt is 5/5 throughout UE) RUE Sensation: WFL - Light Touch RUE Coordination: WFL - gross/fine motor Left Upper Extremity Assessment LUE ROM/Strength/Tone: Within functional levels;WFL for tasks assessed (Pt is 5/5 throughout) LUE Sensation: WFL - Light Touch LUE Coordination: WFL - gross/fine motor Right Lower Extremity Assessment RLE ROM/Strength/Tone: Deficits RLE ROM/Strength/Tone Deficits: has to have TKA revision. grossly wfl RLE Sensation: WFL - Light Touch Left Lower Extremity Assessment LLE ROM/Strength/Tone: Deficits LLE ROM/Strength/Tone Deficits: kee is flexxed and externally rotated in bed.   Balance Balance Balance Assessed: Yes Static Sitting Balance Static Sitting - Balance Support: Feet supported;Bilateral upper extremity supported Static Standing Balance Static Standing - Balance Support: During functional activity;Bilateral upper extremity supported  End of Session PT - End of Session Activity Tolerance: Patient limited by fatigue;Patient limited by pain Patient left: in chair;with call bell/phone within reach;with family/visitor present CPM Left Knee CPM Left Knee: On  GP     [name] 09/29/2012, 4:34 PM  [phone]

## 2012-09-29 NOTE — Evaluation (Signed)
Occupational Therapy Evaluation Patient Details Name: Arthur Lutz MRN: [id number] DOB: March 22, 1941 Today's Date: 09/29/2012 Time: 1030-1051 OT Time Calculation (min): 21 min  OT Assessment / Plan / Recommendation Clinical Impression  Pt is a 72 y/o male s/p L TKA, he is currently Min A LB ADL's & functional mobility and transfers. He plans to d/c home w/ wife assist PRN. He should benefit from cont acute OT to assist in maximizing independence w/ ADL's prior to d/c home.    OT Assessment  Patient needs continued OT Services    Follow Up Recommendations  No OT follow up    Barriers to Discharge      Equipment Recommendations  3 in 1 bedside comode    Recommendations for Other Services    Frequency  Min 2X/week    Precautions / Restrictions Restrictions Weight Bearing Restrictions: No   Pertinent Vitals/Pain L knee 5/10 pain. Pt was premedicated. Increased activity and repositioned in chair.   ADL  Grooming: Simulated;Set up;Modified independent Where Assessed - Grooming: Supported sitting Upper Body Bathing: Simulated;Set up;Supervision/safety Where Assessed - Upper Body Bathing: Supported sitting Lower Body Bathing: Simulated;Minimal assistance Where Assessed - Lower Body Bathing: Supported sit to stand;Supported sitting Upper Body Dressing: Performed;Supervision/safety;Set up Where Assessed - Upper Body Dressing: Unsupported sitting Lower Body Dressing: Performed;Minimal assistance Where Assessed - Lower Body Dressing: Supported sit to stand;Unsupported sitting Toilet Transfer: Simulated;Minimal assistance Toilet Transfer Method: Sit to Barista: Bedside commode Toileting - Clothing Manipulation and Hygiene: Simulated;Supervision/safety;Minimal assistance Where Assessed - Engineer, mining and Hygiene: Sit on 3-in-1 or toilet Tub/Shower Transfer Method: Not assessed Equipment Used: Rolling walker;Knee  Immobilizer Transfers/Ambulation Related to ADLs: Pt was lethargic today and required close supervision/min guard assist during functional mobility and transfers. Toilet transfers were simulated at Min A level w/ transfer bed-chair. ADL Comments: Pt is a 72 y/o male s/p L TKA, he is currently Min A LB ADL's and functional mobility and transfers. He plans to d/c home w/ wife assist PRN. He should benefit from cont acute OT to assist in maximizing independence w/ ADL's prior to d/c home.    OT Diagnosis: Acute pain  OT Problem List: Decreased activity tolerance;Decreased knowledge of use of DME or AE;Pain OT Treatment Interventions: Self-care/ADL training;Therapeutic activities;DME and/or AE instruction;Patient/family education   OT Goals Acute Rehab OT Goals OT Goal Formulation: With patient Potential to Achieve Goals: Good ADL Goals Pt Will Perform Grooming: with supervision;Standing at sink;Sitting, chair;with modified independence;Supported ADL Goal: Grooming - Progress: Goal set today Pt Will Perform Lower Body Dressing: with min assist;Sit to stand from chair;Sitting, chair;with adaptive equipment;with caregiver independent in assisting ADL Goal: Lower Body Dressing - Progress: Goal set today Pt Will Transfer to Toilet: with supervision;Ambulation;3-in-1;with DME ADL Goal: Toilet Transfer - Progress: Goal set today Pt Will Perform Toileting - Clothing Manipulation: with supervision;Sitting on 3-in-1 or toilet;Standing ADL Goal: Toileting - Clothing Manipulation - Progress: Goal set today Pt Will Perform Toileting - Hygiene: with modified independence;Sitting on 3-in-1 or toilet ADL Goal: Toileting - Hygiene - Progress: Goal set today Pt Will Perform Tub/Shower Transfer: Tub transfer;with min assist;Ambulation;with DME ADL Goal: Tub/Shower Transfer - Progress: Goal set today  Visit Information  Last OT Received On: 09/29/12 Assistance Needed: +1    Subjective Data  Subjective: Pt  plans to return home with PRN wife assist Patient Stated Goal: Return home   Prior Functioning     Home Living Lives With: Spouse Available Help at Discharge: Other (Comment) (  Spouse to assist @ d/c, Dtr across street) Type of Home: House Home Layout: Two level;Able to live on main level with bedroom/bathroom;1/2 bath on main level Alternate Level Stairs-Number of Steps: 5 STE  Alternate Level Stairs-Rails: Right Bathroom Shower/Tub: Tub/shower unit;Curtain Bathroom Toilet: Standard Bathroom Accessibility: Yes How Accessible: Accessible via walker Home Adaptive Equipment: Straight cane Prior Function Level of Independence: Independent Able to Take Stairs?: Yes Driving: Yes Vocation: Full time employment Comments: Farm cattle, also works as a Development worker, 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 Dominant Hand: Right    Vision/Perception Vision - History Baseline Vision: Wears glasses all the time Patient Visual Report: No change from baseline   Cognition  Cognition Overall Cognitive Status: Appears within functional limits for tasks assessed/performed Arousal/Alertness: Lethargic Orientation Level: Appears intact for tasks assessed Behavior During Session: [facility] for tasks performed Cognition - Other Comments: Pt states that he is lethargic secondary to "not sleeping last night"     Extremity/Trunk Assessment Right Upper Extremity Assessment RUE ROM/Strength/Tone: Within functional levels;WFL for tasks assessed (Pt is 5/5 throughout UE) RUE Sensation: WFL - Light Touch RUE Coordination: WFL - gross/fine motor Left Upper Extremity Assessment LUE ROM/Strength/Tone: Within functional levels;WFL for tasks assessed (Pt is 5/5 throughout) LUE Sensation: WFL - Light Touch LUE Coordination: WFL - gross/fine motor    Mobility Bed Mobility Bed Mobility: Supine to Sit;Sitting - Scoot to Edge of Bed Supine to Sit: 4: Min assist;HOB elevated;Other (comment) (Using trapeze bar  overhead, support to L LE) Sitting - Scoot to Edge of Bed: 4: Min guard;4: Min assist;Other (comment) (Support to LLE) Transfers Transfers: Sit to Stand;Stand to Sit Sit to Stand: 4: Min assist;From bed;From chair/3-in-1 Stand to Sit: 4: Min assist;To chair/3-in-1        Balance Balance Balance Assessed: Yes Static Sitting Balance Static Sitting - Balance Support: Feet supported;Bilateral upper extremity supported Static Standing Balance Static Standing - Balance Support: During functional activity;Bilateral upper extremity supported (RW use)   End of Session OT - End of Session Equipment Utilized During Treatment: Left knee immobilizer;Other (comment) (RW) Activity Tolerance: Patient tolerated treatment well;Other (comment) (Pt lethargic) Patient left: in chair;with call bell/phone within reach;with family/visitor present Nurse Communication: Mobility status CPM Left Knee CPM Left Knee: Off  GO     Alm Bustard 09/29/2012, 12:58 PM

## 2012-09-29 NOTE — Care Management Note (Signed)
    Page 1 of 1   09/29/2012     9:55:56 AM   CARE MANAGEMENT NOTE 09/29/2012  Patient:  PAMELA, MADDY   Account Number:  [id number]  Date Initiated:  09/29/2012  Documentation initiated by:  Lorenda Ishihara  Subjective/Objective Assessment:   72 yo male admitted s/p total knee arthroplasty. PTA lived at home with spouse.     Action/Plan:   Home when stable   Anticipated DC Date:  09/30/2012   Anticipated DC Plan:  HOME W HOME HEALTH SERVICES      DC Planning Services  CM consult      [facility] Choice  HOME HEALTH   Choice offered to / List presented to:  C-3 Spouse        HH arranged  HH-2 PT      HH agency  [facility]   Status of service:  Completed, signed off Medicare Important Message given?   (If response is "NO", the following Medicare IM given date fields will be blank) Date Medicare IM given:   Date Additional Medicare IM given:    Discharge Disposition:  HOME W HOME HEALTH SERVICES  Per UR Regulation:  Reviewed for med. necessity/level of care/duration of stay  If discussed at Long Length of Stay Meetings, dates discussed:    Comments:

## 2012-09-29 NOTE — Progress Notes (Signed)
   Subjective: 1 Day Post-Op Procedure(s) (LRB): TOTAL KNEE ARTHROPLASTY (Left) Patient reports pain as mild and moderate.   Patient seen in rounds with Dr. Lequita Halt.  Wife in room. Patient is well, and has had no acute complaints or problems We will start therapy today.  Plan is to go Home after hospital stay.  Objective: Vital signs in last 24 hours: Temp:  [97.5 F (36.4 C)-98.2 F (36.8 C)] 97.5 F (36.4 C) (03/04 0559) Pulse Rate:  [52-79] 67 (03/04 0559) Resp:  [8-16] 14 (03/04 0800) BP: (137-193)/(65-102) 138/70 mmHg (03/04 0559) SpO2:  [92 %-100 %] 96 % (03/04 0800) Weight:  [114.306 kg (252 lb)] 114.306 kg (252 lb) (03/03 1411)  Intake/Output from previous day:  Intake/Output Summary (Last 24 hours) at 09/29/12 0903 Last data filed at 09/29/12 0600  Gross per 24 hour  Intake 5951.67 ml  Output   1925 ml  Net 4026.67 ml    Intake/Output this shift:    Labs:  Recent Labs  09/29/12 0425  HGB 11.2*    Recent Labs  09/29/12 0425  WBC 8.1  RBC 3.55*  HCT 33.5*  PLT 169    Recent Labs  09/29/12 0425  NA 136  K 3.7  CL 100  CO2 29  BUN 15  CREATININE 0.90  GLUCOSE 152*  CALCIUM 8.2*   No results found for this basename: LABPT, INR,  in the last 72 hours  EXAM General - Patient is Alert, Appropriate and Oriented Extremity - Neurovascular intact Sensation intact distally Dorsiflexion/Plantar flexion intact Dressing - dressing C/D/I Motor Function - intact, moving foot and toes well on exam.  Hemovac pulled without difficulty.  Past Medical History  Diagnosis Date  . Hypertension   . Diabetes mellitus   . Allergic rhinitis   . CAD (coronary artery disease)   . CAD (coronary artery disease), NEW 08/07/11 cutting balloon PTCA to 95% ostial diag. and 90%  proximal LCX.  prior cutting balloon and to RCA  & ostium of ostial diag.  08/07/2011  . HTN (hypertension) 08/07/2011  . High cholesterol   . Angina   . Heart attack ~ 2009    "mild; did not  affect the heart muscle"  . Shortness of breath on exertion   . Arthritis   . 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 Melanoma of skin ~ 2005    "level 2; on my back"  . Bladder cancer 11/2005  . DOE (dyspnea on exertion) 08/07/2011  . Exertional angina 08/07/2011  . Hyperlipemia, intolerant to statins. 08/07/2011  . Arthritis of knee, left, needs total Knee in future with Dr. Lequita Halt  08/07/2011  . Portacath in place 08/11/2012  . Heart murmur     Assessment/Plan: 1 Day Post-Op Procedure(s) (LRB): TOTAL KNEE ARTHROPLASTY (Left) Principal Problem:   OA (osteoarthritis) of knee Active Problems:   Postoperative anemia due to acute blood loss  Estimated body mass index is 34.17 kg/(m^2) as calculated from the following:   Height as of this encounter: 6' (1.829 m).   Weight as of this encounter: 114.306 kg (252 lb). Up with therapy Plan for discharge tomorrow Discharge home with home health  DVT Prophylaxis - Xarelto, ASA 81 mg on hold, Plavix 75 mg on hold Weight-Bearing as tolerated to left leg No vaccines. D/C O2 and Pulse OX and try on Room Air  Arthur Lutz, Arthur Lutz 09/29/2012, 9:03 AM

## 2012-09-29 NOTE — Progress Notes (Signed)
Physical Therapy Treatment Patient Details Name: Arthur Lutz MRN: [id number] DOB: 1941/03/22 Today's Date: 09/29/2012 Time: 2841-3244 PT Time Calculation (min): 25 min  PT Assessment / Plan / Recommendation Comments on Treatment Session  Pt' having discomfort across knee. noted dressing hardened by dried blood . Pt. continues to have difficulty with knee extension and pain.    Follow Up Recommendations  Home health PT     Does the patient have the potential to tolerate intense rehabilitation     Barriers to Discharge        Equipment Recommendations  Rolling walker with 5" wheels    Recommendations for Other Services    Frequency 7X/week   Plan Discharge plan remains appropriate    Precautions / Restrictions Precautions Precautions: Knee Required Braces or Orthoses: Knee Immobilizer - Left   Pertinent Vitals/Pain Still has 10 pain after meds.    Mobility  Bed Mobility Bed Mobility: Sit to Supine Supine to Sit: 4: Min assist;HOB elevated;Other (comment) Sitting - Scoot to Edge of Bed: 4: Min guard;4: Min assist;Other (comment) Sit to Supine: 4: Min assist Details for Bed Mobility Assistance: assist for LLE onto bed. Transfers Sit to Stand: 4: Min assist;From chair/3-in-1 Stand to Sit: 4: Min assist;To bed Details for Transfer Assistance: cues for LLE position prior to sitting. Ambulation/Gait Ambulation/Gait Assistance: 1: +2 Total assist Ambulation/Gait: Patient Percentage: 70% Ambulation Distance (Feet): 10 Feet Assistive device: Rolling walker Ambulation/Gait Assistance Details: KI removed while ambulating due to incr. discomfort. Dressing has dried blood  around top and is hard and pressing on pt's skin. loosened around top to unstick skin. Gait Pattern: Antalgic;Step-to pattern;Decreased step length - left;Decreased stance time - left    Exercises Total Joint Exercises Quad Sets: AROM;Right;5 reps;Supine Heel Slides: AAROM;Right;10 reps;Supine Straight Leg  Raises: AAROM;Right;10 reps;Supine   PT Diagnosis: Difficulty walking;Acute pain  PT Problem List: Decreased strength;Decreased range of motion;Decreased activity tolerance;Decreased mobility;Pain;Decreased knowledge of use of DME;Decreased knowledge of precautions PT Treatment Interventions: DME instruction;Gait training;Stair training;Functional mobility training;Therapeutic activities;Therapeutic exercise;Patient/family education   PT Goals Acute Rehab PT Goals PT Goal Formulation: With patient/family Time For Goal Achievement: 10/06/12 Potential to Achieve Goals: Good Pt will go Supine/Side to Sit: with supervision PT Goal: Supine/Side to Sit - Progress: Goal set today Pt will go Sit to Supine/Side: with supervision PT Goal: Sit to Supine/Side - Progress: Progressing toward goal Pt will go Sit to Stand: with supervision PT Goal: Sit to Stand - Progress: Progressing toward goal Pt will go Stand to Sit: with supervision PT Goal: Stand to Sit - Progress: Progressing toward goal Pt will Ambulate: 51 - 150 feet;with supervision;with rolling walker PT Goal: Ambulate - Progress: Progressing toward goal Pt will Go Up / Down Stairs: 3-5 stairs;with min assist;with least restrictive assistive device PT Goal: Up/Down Stairs - Progress: Goal set today Pt will Perform Home Exercise Program: with supervision, verbal cues required/provided PT Goal: Perform Home Exercise Program - Progress: Progressing toward goal  Visit Information  Last PT Received On: 09/29/12 Assistance Needed: +1    Subjective Data  Subjective: let's do what I need to. I cannot put weight on my leg. the brace is too tight. Patient Stated Goal:  I want to get this over.   Cognition  Cognition Overall Cognitive Status: Appears within functional limits for tasks assessed/performed Arousal/Alertness: Lethargic Orientation Level: Appears intact for tasks assessed Behavior During Session: [facility] for tasks performed Cognition  - Other Comments: Pt states that he is lethargic secondary to "not  sleeping last night"     Balance  Balance Balance Assessed: Yes Static Sitting Balance Static Sitting - Balance Support: Feet supported;Bilateral upper extremity supported Static Standing Balance Static Standing - Balance Support: During functional activity;Bilateral upper extremity supported  End of Session PT - End of Session Activity Tolerance: Patient limited by fatigue;Patient limited by pain Patient left: with call bell/phone within reach;with family/visitor present;in bed Nurse Communication: Mobility status CPM Left Knee CPM Left Knee: On   GP     [name] 09/29/2012, 4:41 PM

## 2012-09-30 LAB — BASIC METABOLIC PANEL
BUN: 15 mg/dL (ref 6–23)
Calcium: 8.8 mg/dL (ref 8.4–10.5)
Chloride: 103 mEq/L (ref 96–112)
Creatinine, Ser: 0.88 mg/dL (ref 0.50–1.35)
GFR calc Af Amer: >90 mL/min (ref >90)

## 2012-09-30 LAB — CBC
HCT: 30.5 % — ABNORMAL LOW (ref 39.0–52.0)
MCH: 31.4 pg (ref 26.0–34.0)
MCV: 94.7 fL (ref 78.0–100.0)
RDW: 13.2 % (ref 11.5–15.5)
WBC: 9.7 10*3/uL (ref 4.0–10.5)

## 2012-09-30 LAB — GLUCOSE, CAPILLARY
Glucose-Capillary: 159 mg/dL — ABNORMAL HIGH (ref 70–99)
Glucose-Capillary: 139 mg/dL — ABNORMAL HIGH (ref 70–99)

## 2012-09-30 MED ORDER — TRIAZOLAM 0.25 MG PO TABS
0.2500 mg | ORAL_TABLET | Freq: Every evening | ORAL | Status: DC | PRN
  Administered 2012-09-30: 0.25 mg via ORAL
  Filled 2012-09-30: qty 1

## 2012-09-30 NOTE — Progress Notes (Signed)
09/30/2012 Colleen Can BSN RN CCM [phone] CM spoke with patient. Current plans are for SNF for rehab. CM notified CSW that patient had made choice for [facility] rehab. [facility] dme rep notified and will pick up RW that had been delivered to room.  Rep states DME order for RW, trapeze, and 3n1 will be cancelled.

## 2012-09-30 NOTE — Progress Notes (Signed)
Clinical Social Work Department BRIEF PSYCHOSOCIAL ASSESSMENT 09/30/2012  Patient:  Arthur Lutz, Arthur Lutz     Account Number:  [id number]     Admit date:  09/28/2012  Clinical Social Worker:  Candie Chroman  Date/Time:  09/30/2012 11:18 AM  Referred by:  Physician  Date Referred:  09/30/2012 Referred for  SNF Placement   Other Referral:   Interview type:  Patient Other interview type:    PSYCHOSOCIAL DATA Living Status:  WIFE Admitted from facility:   Level of care:   Primary support name:  Arthur Lutz Primary support relationship to patient:  SPOUSE Degree of support available:   Limited due to health issues.    CURRENT CONCERNS Current Concerns  Post-Acute Placement   Other Concerns:    SOCIAL WORK ASSESSMENT / PLAN Pt is a 72 yr old gentleman living at home prior to hospitalization. CSW met with pt today to assist with d/c planning. Pt was hoping to return home with [facility] and support from his spouse. At this time his spouse is experiencing some medical issues and may not be able to provide needed assistance. ST SNF may be required following hospital d/c. Pt has requested placement at [facility] . [facility] is able to offer placement if needed. CSW has contacted [facility] to request authorization for ST SNF placement. CSW will continue to follow to assist with d/c planning needs.   Assessment/plan status:  Psychosocial Support/Ongoing Assessment of Needs Other assessment/ plan:   Information/referral to community resources:   None needed at this time.    PATIENT'S/FAMILY'S RESPONSE TO PLAN OF CARE: Pt wants to return home but will accept placement if required.   Arthur Razor LCSW [phone]

## 2012-09-30 NOTE — Progress Notes (Signed)
CSW is assisting with d/c planning. Pt has accepted ST Rehab bed at [facility]. CSW has requested [facility] authorization . Decision is pending. CSW will continue to assist with d/c planning to SNF.  Cori Razor LCSW [phone]

## 2012-09-30 NOTE — Progress Notes (Signed)
Physical Therapy Treatment Patient Details Name: Arthur Lutz MRN: [id number] DOB: September 25, 1940 Today's Date: 09/30/2012 Time: 1308-6578 PT Time Calculation (min): 26 min  PT Assessment / Plan / Recommendation Comments on Treatment Session  Pt. is feeling much better. Wife has medical concerns so Pt. is considering SNF. Pt. is improving in mobility.    Follow Up Recommendations  SNF     Does the patient have the potential to tolerate intense rehabilitation     Barriers to Discharge        Equipment Recommendations  Rolling walker with 5" wheels    Recommendations for Other Services    Frequency 7X/week   Plan Discharge plan needs to be updated;Frequency remains appropriate    Precautions / Restrictions Precautions Precautions: Knee Required Braces or Orthoses: Knee Immobilizer - Left   Pertinent Vitals/Pain 3     Mobility  Transfers Sit to Stand: 5: Supervision;From chair/3-in-1;4: Min guard Stand to Sit: To chair/3-in-1;5: Supervision Details for Transfer Assistance: cues for safety, LLE position to sit down. Ambulation/Gait Ambulation/Gait Assistance: 4: Min assist Ambulation Distance (Feet): 60 Feet Assistive device: Rolling walker Ambulation/Gait Assistance Details: Pt. tolerated KI well after Dressing has been removed. Gait Pattern: Antalgic;Step-to pattern;Decreased step length - left;Decreased stance time - left Gait velocity: decre.    Exercises Total Joint Exercises Quad Sets: AROM;Right;5 reps;Supine;10 reps Short Arc QuadBarbaraann Boys;Right;10 reps;Supine Straight Leg Raises: AAROM;Right;10 reps;Supine Long Arc Quad: AAROM;Right;10 reps;Seated Knee Flexion: AAROM;Right;10 reps;Seated   PT Diagnosis:    PT Problem List:   PT Treatment Interventions:     PT Goals Acute Rehab PT Goals Pt will go Sit to Stand: with supervision PT Goal: Sit to Stand - Progress: Partly met Pt will go Stand to Sit: with supervision PT Goal: Stand to Sit - Progress:  Progressing toward goal Pt will Ambulate: 51 - 150 feet;with supervision;with rolling walker PT Goal: Ambulate - Progress: Progressing toward goal Pt will Perform Home Exercise Program: with supervision, verbal cues required/provided PT Goal: Perform Home Exercise Program - Progress: Progressing toward goal  Visit Information  Last PT Received On: 09/30/12 Assistance Needed: +1    Subjective Data  Subjective: I sent my wife to the ED. i think she is stressed. I maybe should go to [employer].   Cognition  Cognition Overall Cognitive Status: Appears within functional limits for tasks assessed/performed    Balance     End of Session PT - End of Session Activity Tolerance: Patient tolerated treatment well Patient left: in chair Nurse Communication: Mobility status CPM Left Knee CPM Left Knee: On   GP     [name] 09/30/2012, 3:09 PM

## 2012-09-30 NOTE — Progress Notes (Signed)
Subjective: 2 Days Post-Op Procedure(s) (LRB): TOTAL KNEE ARTHROPLASTY (Left) Patient reports pain as mild and moderate.  He is feeling better today though. Patient seen in rounds for Dr. Lequita Halt. Patient is well, and has had no acute complaints or problems Plan is to go Home after hospital stay.  Although, he may need to consider SNF at this point since his wife has developed some recent acute medical issues and may not be able to be with him now.   Objective: Vital signs in last 24 hours: Temp:  [98.3 F (36.8 C)-98.5 F (36.9 C)] 98.3 F (36.8 C) (03/05 1357) Pulse Rate:  [84-115] 90 (03/05 1357) Resp:  [15-18] 15 (03/05 1357) BP: (118-148)/(58-70) 118/58 mmHg (03/05 1357) SpO2:  [91 %-94 %] 94 % (03/05 1357)  Intake/Output from previous day:  Intake/Output Summary (Last 24 hours) at 09/30/12 1604 Last data filed at 09/30/12 1048  Gross per 24 hour  Intake   2000 ml  Output   1425 ml  Net    575 ml    Intake/Output this shift: Total I/O In: 240 [P.O.:240] Out: 325 [Urine:325]  Labs:  Recent Labs  09/29/12 0425 09/30/12 0410  HGB 11.2* 10.1*    Recent Labs  09/29/12 0425 09/30/12 0410  WBC 8.1 9.7  RBC 3.55* 3.22*  HCT 33.5* 30.5*  PLT 169 180    Recent Labs  09/29/12 0425 09/30/12 0410  NA 136 138  K 3.7 3.6  CL 100 103  CO2 29 27  BUN 15 15  CREATININE 0.90 0.88  GLUCOSE 152* 140*  CALCIUM 8.2* 8.8   No results found for this basename: LABPT, INR,  in the last 72 hours  EXAM General - Patient is Alert, Appropriate and Oriented Extremity - Neurovascular intact Sensation intact distally Dorsiflexion/Plantar flexion intact No cellulitis present Dressing/Incision - clean, dry, no drainage, healing Motor Function - intact, moving foot and toes well on exam.   Past Medical History  Diagnosis Date  . Hypertension   . Diabetes mellitus   . Allergic rhinitis   . CAD (coronary artery disease)   . CAD (coronary artery disease), NEW 08/07/11  cutting balloon PTCA to 95% ostial diag. and 90%  proximal LCX.  prior cutting balloon and to RCA  & ostium of ostial diag.  08/07/2011  . HTN (hypertension) 08/07/2011  . High cholesterol   . Angina   . Heart attack ~ 2009    "mild; did not affect the heart muscle"  . Shortness of breath on exertion   . Arthritis   . 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 Melanoma of skin ~ 2005    "level 2; on my back"  . Bladder cancer 11/2005  . DOE (dyspnea on exertion) 08/07/2011  . Exertional angina 08/07/2011  . Hyperlipemia, intolerant to statins. 08/07/2011  . Arthritis of knee, left, needs total Knee in future with Dr. Lequita Halt  08/07/2011  . Portacath in place 08/11/2012  . Heart murmur     Assessment/Plan: 2 Days Post-Op Procedure(s) (LRB): TOTAL KNEE ARTHROPLASTY (Left) Principal Problem:   OA (osteoarthritis) of knee Active Problems:   Postoperative anemia due to acute blood loss  Estimated body mass index is 34.17 kg/(m^2) as calculated from the following:   Height as of this encounter: 6' (1.829 m).   Weight as of this encounter: 114.306 kg (252 lb). Up with therapy Plan for home versus SNF  DVT Prophylaxis - Xarelto Weight-Bearing as tolerated to left leg  Arthur Lutz 09/30/2012, 4:04 PM

## 2012-09-30 NOTE — Progress Notes (Signed)
Clinical Social Work Department CLINICAL SOCIAL WORK PLACEMENT NOTE 09/30/2012  Patient:  Arthur Lutz, Arthur Lutz  Account Number:  [id number] Admit date:  09/28/2012  Clinical Social Worker:  Cori Razor, LCSW  Date/time:  09/30/2012 11:30 AM  Clinical Social Work is seeking post-discharge placement for this patient at the following level of care:   SKILLED NURSING   (*CSW will update this form in Epic as items are completed)     Patient/family provided with [facility] Department of Clinical Social Work's list of facilities offering this level of care within the geographic area requested by the patient (or if unable, by the patient's family).  09/30/2012  Patient/family informed of their freedom to choose among providers that offer the needed level of care, that participate in Medicare, Medicaid or managed care program needed by the patient, have an available bed and are willing to accept the patient.  09/30/2012  Patient/family informed of [facility]' ownership interest in [facility], as well as of the fact that they are under no obligation to receive care at this facility.  PASARR submitted to EDS on 09/30/2012 PASARR number received from EDS on 09/30/2012  FL2 transmitted to all facilities in geographic area requested by pt/family on  09/30/2012 FL2 transmitted to all facilities within larger geographic area on   Patient informed that his/her managed care company has contracts with or will negotiate with  certain facilities, including the following:     Patient/family informed of bed offers received:  09/30/2012 Patient chooses bed at  Physician recommends and patient chooses bed at  [facility]  Patient to be transferred to [facility] on   Patient to be transferred to facility by   The following physician request were entered in Epic:   Additional Comments:  Cori Razor LCSW [phone]

## 2012-09-30 NOTE — Progress Notes (Signed)
Occupational Therapy Treatment Patient Details Name: Arthur Lutz MRN: [id number] DOB: May 10, 1941 Today's Date: 09/30/2012 Time: 9528-4132 OT Time Calculation (min): 32 min  OT Assessment / Plan / Recommendation Comments on Treatment Session Pt is progressing towrads goals, if pt's wife is not able to assist as initially planned, pt is agreeable to go to SNF Rehab. Pt currently Min guard assist-close supervision toilet transfers & Min assist LB dressing/bathing. Cont to monitor needs (home vs SNF as pt wife currently in ED w/ back, chest and arm pain).    Follow Up Recommendations  SNF rehab vs.Supervision/Assistance - 24 hour    Barriers to Discharge   Pt's wife currently in ED, cont to monitor as she was planning on assisting pt PRN at home    Equipment Recommendations  3 in 1 bedside comode    Recommendations for Other Services    Frequency Min 2X/week   Plan Other (comment) (See above)    Precautions / Restrictions Precautions Precautions: Knee Required Braces or Orthoses: Knee Immobilizer - Left Restrictions Weight Bearing Restrictions: No   Pertinent Vitals/Pain Pt states 2-3/10 prior to OT session    ADL  Grooming:  (Pt declined grooming this am) Where Assessed - Grooming: Other (comment) (See above) Upper Body Bathing: Simulated;Set up;Modified independent (Pt declined bathe/dressing this am) Lower Body Bathing: Simulated;Other (comment);Minimal assistance (Pt declined bathing/dressing this am) Upper Body Dressing: Simulated;Set up;Modified independent Where Assessed - Upper Body Dressing: Supported sitting;Unsupported sitting Lower Body Dressing: Simulated;Other (comment);Minimal assistance (Issued handout on A/E & reviewed w/ pt today) Where Assessed - Lower Body Dressing: Supported sitting;Supported sit to stand Toilet Transfer: Performed;Min Pension scheme manager Method: Sit to Barista: Raised toilet seat with arms (or 3-in-1 over  toilet);Grab bars Toileting - Clothing Manipulation and Hygiene: Performed;Supervision/safety Where Assessed - Engineer, mining and Hygiene: Sit on 3-in-1 or toilet Tub/Shower Transfer Method: Not assessed Equipment Used: Knee Immobilizer;Other (comment) (A/E reviewed w/ pt & handout issued.) Transfers/Ambulation Related to ADLs: Pt much more alert today. Functional mobility in room w/ Close supervision, toilet transfers w/ Min guard assist.  ADL Comments: Pt reports that his wife is in ED today w/ back, chest and arm pain. He states that he will go to SNF rehab is needed as plan was for wife to provide Min A level care for LB bathe/dressing PRN. A/E was reviewed w/ pt today and while pt declined formal bathe/dress, he was receptive to simulated tasks. Pt was min guard assist for toileting transfer today.    OT Diagnosis:    OT Problem List:   OT Treatment Interventions:     OT Goals ADL Goals ADL Goal: Grooming - Progress: Progressing toward goals ADL Goal: Lower Body Dressing - Progress: Progressing toward goals ADL Goal: Toilet Transfer - Progress: Progressing toward goals ADL Goal: Toileting - Clothing Manipulation - Progress: Progressing toward goals ADL Goal: Toileting - Hygiene - Progress: Progressing toward goals  Visit Information  Last OT Received On: 09/30/12 Assistance Needed: +1    Subjective Data  Subjective: Pt reports that his wife is in ED w/ back, chest & arm pain. He is concerned that she may not be able to care for him as planned Patient Stated Goal: Pt considering SNF rehab vs home w/ wife assist depending on her ability to care for him   Prior Functioning    Independent, Has cattle farm and works as Chiropodist Overall Cognitive Status: Appears within functional limits for tasks  assessed/performed Arousal/Alertness: Awake/alert Orientation Level: Appears intact for tasks assessed Behavior During Session: [facility] for tasks  performed    Mobility  Bed Mobility Bed Mobility: Supine to Sit;Sitting - Scoot to Edge of Bed Supine to Sit: 4: Min assist;HOB elevated Sitting - Scoot to [place] of Bed: 5: Supervision;4: Min guard Details for Bed Mobility Assistance: Assist for LLE off of bed Transfers Transfers: Sit to Stand;Stand to Sit Sit to Stand: 5: Supervision;From bed;From chair/3-in-1;4: Min guard Stand to Sit: 4: Min guard;To chair/3-in-1            End of Session OT - End of Session Equipment Utilized During Treatment: Left knee immobilizer;Other (comment) (3:1 over toilet, A/E handout reviewed) Activity Tolerance: Patient tolerated treatment well Patient left: in chair;with call bell/phone within reach CPM Left Knee CPM Left Knee: Off  GO     Roselie Awkward Dixon 09/30/2012, 10:00 AM

## 2012-09-30 NOTE — Progress Notes (Signed)
Physical Therapy Treatment Patient Details Name: Arthur Lutz MRN: [id number] DOB: Aug 18, 1940 Today's Date: 09/30/2012 Time: 1308-6578 PT Time Calculation (min): 14 min  PT Assessment / Plan / Recommendation Comments on Treatment Session  Pt. improving in ambulation, incr. weight on LLE. still unable to perform SLR. plans SNF.    Follow Up Recommendations  SNF     Does the patient have the potential to tolerate intense rehabilitation     Barriers to Discharge        Equipment Recommendations  Rolling walker with 5" wheels    Recommendations for Other Services    Frequency 7X/week   Plan Frequency remains appropriate;Discharge plan remains appropriate    Precautions / Restrictions Precautions Precautions: Knee Required Braces or Orthoses: Knee Immobilizer - Left   Pertinent Vitals/Pain    Mobility  Bed Mobility Sit to Supine: 4: Min assist Details for Bed Mobility Assistance: Assist for LLE onto of bed Transfers Sit to Stand: 5: Supervision;From chair/3-in-1;4: Min guard Stand to Sit: 5: Supervision;To bed Details for Transfer Assistance: cues for safety, LLE position to sit down. Ambulation/Gait Ambulation/Gait Assistance: 4: Min guard Ambulation Distance (Feet): 60 Feet Assistive device: Rolling walker Ambulation/Gait Assistance Details: Pt. tolerated KI well after Dressing has been removed. Gait Pattern: Antalgic;Step-to pattern;Decreased step length - left;Decreased stance time - left Gait velocity: decre.    Exercises Total Joint Exercises Quad Sets: AROM;Right;5 reps;Supine;10 reps Short Arc QuadBarbaraann Boys;Right;10 reps;Supine Straight Leg Raises: AAROM;Right;10 reps;Supine Long Arc Quad: AAROM;Right;10 reps;Seated Knee Flexion: AAROM;Right;10 reps;Seated   PT Diagnosis:    PT Problem List:   PT Treatment Interventions:     PT Goals Acute Rehab PT Goals Pt will go Sit to Supine/Side: with supervision PT Goal: Sit to Supine/Side - Progress: Progressing  toward goal Pt will go Sit to Stand: with modified independence PT Goal: Sit to Stand - Progress: Updated due to goal met Pt will go Stand to Sit: with modified independence PT Goal: Stand to Sit - Progress: Updated due to goals met Pt will Ambulate: 51 - 150 feet;with supervision;with rolling walker PT Goal: Ambulate - Progress: Progressing toward goal PT Goal: Up/Down Stairs - Progress: Progressing toward goal Pt will Perform Home Exercise Program: with supervision, verbal cues required/provided PT Goal: Perform Home Exercise Program - Progress: Progressing toward goal  Visit Information  Last PT Received On: 09/30/12 Assistance Needed: +1    Subjective Data  Subjective: my son will take me to rehab.   Cognition  Cognition Overall Cognitive Status: Appears within functional limits for tasks assessed/performed    Balance     End of Session PT - End of Session Activity Tolerance: Patient tolerated treatment well Patient left: in bed Nurse Communication: Mobility status CPM Left Knee CPM Left Knee: On   GP     [name] 09/30/2012, 3:13 PM [phone]

## 2012-10-01 ENCOUNTER — Inpatient Hospital Stay
Admission: RE | Admit: 2012-10-01 | Discharge: 2012-10-23 | Disposition: A | Discharge status: Home / Self Care | Payer: Medicare Other | Source: Ambulatory Visit | Attending: Internal Medicine | Admitting: Internal Medicine

## 2012-10-01 DIAGNOSIS — R000 Tachycardia, unspecified: Principal | ICD-10-CM

## 2012-10-01 DIAGNOSIS — I10 Essential (primary) hypertension: Secondary | ICD-10-CM

## 2012-10-01 LAB — CBC
HCT: 28.6 % — ABNORMAL LOW (ref 39.0–52.0)
Hemoglobin: 9.8 g/dL — ABNORMAL LOW (ref 13.0–17.0)
MCHC: 34.3 g/dL (ref 30.0–36.0)
MCV: 92.9 fL (ref 78.0–100.0)
WBC: 6.6 10*3/uL (ref 4.0–10.5)

## 2012-10-01 LAB — GLUCOSE, CAPILLARY

## 2012-10-01 MED ORDER — DSS 100 MG PO CAPS: 100.0000 mg | ORAL_CAPSULE | Freq: Two times a day (BID) | ORAL | Status: DC

## 2012-10-01 MED ORDER — ONDANSETRON HCL 4 MG PO TABS: 4.0000 mg | ORAL_TABLET | Freq: Four times a day (QID) | ORAL | Status: DC | PRN

## 2012-10-01 MED ORDER — POLYETHYLENE GLYCOL 3350 17 G PO PACK: 17.0000 g | PACK | Freq: Every day | ORAL | Status: DC | PRN

## 2012-10-01 MED ORDER — TRAMADOL HCL 50 MG PO TABS: 50.0000 mg | ORAL_TABLET | Freq: Four times a day (QID) | ORAL | Status: DC | PRN

## 2012-10-01 MED ORDER — RIVAROXABAN 10 MG PO TABS: 10.0000 mg | ORAL_TABLET | Freq: Every day | ORAL | Status: DC

## 2012-10-01 MED ORDER — METHOCARBAMOL 500 MG PO TABS: 500.0000 mg | ORAL_TABLET | Freq: Four times a day (QID) | ORAL | Status: DC | PRN

## 2012-10-01 MED ORDER — ACETAMINOPHEN 325 MG PO TABS: 650.0000 mg | ORAL_TABLET | Freq: Four times a day (QID) | ORAL | Status: DC | PRN

## 2012-10-01 MED ORDER — BISACODYL 10 MG RE SUPP: 10.0000 mg | Freq: Every day | RECTAL | Status: DC | PRN

## 2012-10-01 MED ORDER — OXYCODONE HCL 5 MG PO TABS: 5.0000 mg | ORAL_TABLET | ORAL | Status: DC | PRN

## 2012-10-01 NOTE — Progress Notes (Signed)
Subjective: 3 Days Post-Op Procedure(s) (LRB): TOTAL KNEE ARTHROPLASTY (Left) Patient reports pain as mild.   Patient seen in rounds with Dr. Lequita Halt.  Doing okay. Patient is well, and has had no acute complaints or problems Patient is ready to go SNF today.  He is looking into [facility].  Objective: Vital signs in last 24 hours: Temp:  [98.3 F (36.8 C)-99.1 F (37.3 C)] 98.5 F (36.9 C) (03/06 0559) Pulse Rate:  [89-90] 89 (03/06 0559) Resp:  [15-16] 16 (03/06 0559) BP: (118-186)/(58-79) 186/77 mmHg (03/06 0559) SpO2:  [94 %-96 %] 96 % (03/06 0559)  Intake/Output from previous day:  Intake/Output Summary (Last 24 hours) at 10/01/12 0711 Last data filed at 10/01/12 0439  Gross per 24 hour  Intake    720 ml  Output   1275 ml  Net   -555 ml    Intake/Output this shift:    Labs:  Recent Labs  09/29/12 0425 09/30/12 0410 10/01/12 0422  HGB 11.2* 10.1* 9.8*    Recent Labs  09/30/12 0410 10/01/12 0422  WBC 9.7 6.6  RBC 3.22* 3.08*  HCT 30.5* 28.6*  PLT 180 167    Recent Labs  09/29/12 0425 09/30/12 0410  NA 136 138  K 3.7 3.6  CL 100 103  CO2 29 27  BUN 15 15  CREATININE 0.90 0.88  GLUCOSE 152* 140*  CALCIUM 8.2* 8.8   No results found for this basename: LABPT, INR,  in the last 72 hours  EXAM: General - Patient is Alert, Appropriate and Oriented Extremity - Neurovascular intact Sensation intact distally Dorsiflexion/Plantar flexion intact No cellulitis present Incision - clean, dry, no drainage, healing Motor Function - intact, moving foot and toes well on exam.   Assessment/Plan: 3 Days Post-Op Procedure(s) (LRB): TOTAL KNEE ARTHROPLASTY (Left) Procedure(s) (LRB): TOTAL KNEE ARTHROPLASTY (Left) Past Medical History  Diagnosis Date  . Hypertension   . Diabetes mellitus   . Allergic rhinitis   . CAD (coronary artery disease)   . CAD (coronary artery disease), NEW 08/07/11 cutting balloon PTCA to 95% ostial diag. and 90%  proximal  LCX.  prior cutting balloon and to RCA  & ostium of ostial diag.  08/07/2011  . HTN (hypertension) 08/07/2011  . High cholesterol   . Angina   . Heart attack ~ 2009    "mild; did not affect the heart muscle"  . Shortness of breath on exertion   . Arthritis   . 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 Melanoma of skin ~ 2005    "level 2; on my back"  . Bladder cancer 11/2005  . DOE (dyspnea on exertion) 08/07/2011  . Exertional angina 08/07/2011  . Hyperlipemia, intolerant to statins. 08/07/2011  . Arthritis of knee, left, needs total Knee in future with Dr. Lequita Halt  08/07/2011  . Portacath in place 08/11/2012  . Heart murmur    Principal Problem:   OA (osteoarthritis) of knee Active Problems:   Postoperative anemia due to acute blood loss  Estimated body mass index is 34.17 kg/(m^2) as calculated from the following:   Height as of this encounter: 6' (1.829 m).   Weight as of this encounter: 114.306 kg (252 lb). Up with therapy Discharge to SNF Diet - Cardiac diet and Diabetic diet Follow up - in 2 weeks Activity - WBAT Disposition - Skilled nursing facility Condition Upon Discharge - Good D/C Meds - See DC  Summary DVT Prophylaxis - Xarelto  PERKINS, ALEXZANDREW 10/01/2012, 7:11 AM

## 2012-10-01 NOTE — Progress Notes (Signed)
Clinical Social Work Department CLINICAL SOCIAL WORK PLACEMENT NOTE 10/01/2012  Patient:  Arthur Lutz, Arthur Lutz  Account Number:  [id number] Admit date:  09/28/2012  Clinical Social Worker:  Cori Razor, LCSW  Date/time:  09/30/2012 11:30 AM  Clinical Social Work is seeking post-discharge placement for this patient at the following level of care:   SKILLED NURSING   (*CSW will update this form in Epic as items are completed)     Patient/family provided with [facility] Department of Clinical Social Work's list of facilities offering this level of care within the geographic area requested by the patient (or if unable, by the patient's family).  09/30/2012  Patient/family informed of their freedom to choose among providers that offer the needed level of care, that participate in Medicare, Medicaid or managed care program needed by the patient, have an available bed and are willing to accept the patient.  09/30/2012  Patient/family informed of [facility]' ownership interest in [facility], as well as of the fact that they are under no obligation to receive care at this facility.  PASARR submitted to [employer] on 09/30/2012 PASARR number received from [employer] on 09/30/2012  FL2 transmitted to all facilities in geographic area requested by pt/family on  09/30/2012 FL2 transmitted to all facilities within larger geographic area on   Patient informed that his/her managed care company has contracts with or will negotiate with  certain facilities, including the following:     Patient/family informed of bed offers received:  09/30/2012 Patient chooses bed at  Physician recommends and patient chooses bed at  [facility]  Patient to be transferred to [facility] on  10/01/2012 Patient to be transferred to facility by [facility]  The following physician request were entered in Epic:   Additional Comments: [employer] provided prior approval for SNF placemnt. Ambulance auth was  not required.  Cori Razor LCSW [phone]

## 2012-10-01 NOTE — Progress Notes (Signed)
Report called to [facility] R[facility] received report. No change in initial assessment. D FranklinRN

## 2012-10-01 NOTE — Progress Notes (Signed)
Physical Therapy Treatment Patient Details Name: Arthur Lutz MRN: [id number] DOB: Aug 13, 1940 Today's Date: 10/01/2012 Time: 0815-0829 PT Time Calculation (min): 14 min  PT Assessment / Plan / Recommendation Comments on Treatment Session  Pt. improving in ambulation, incr. weight on LLE. still unable to perform SLR. plans SNF.    Follow Up Recommendations  SNF     Does the patient have the potential to tolerate intense rehabilitation     Barriers to Discharge        Equipment Recommendations  Rolling walker with 5" wheels    Recommendations for Other Services    Frequency     Plan Frequency remains appropriate;Discharge plan remains appropriate    Precautions / Restrictions Precautions Precautions: Knee Required Braces or Orthoses: Knee Immobilizer - Left   Pertinent Vitals/Pain 3 pain.    Mobility  Bed Mobility Supine to Sit: 4: Min assist;HOB elevated Details for Bed Mobility Assistance: Assist for LLE oof bed Transfers Sit to Stand: 5: Supervision;4: Min guard;From bed;With upper extremity assist Stand to Sit: 5: Supervision;To bed Details for Transfer Assistance: cues for safety, LLE position to sit down. Ambulation/Gait Ambulation/Gait Assistance: 4: Min guard Assistive device: Rolling walker Ambulation/Gait Assistance Details: Pt unable to perform SLR so used KI Gait Pattern: Antalgic;Step-to pattern;Decreased step length - left;Decreased stance time - left Gait velocity: decre.    Exercises     PT Diagnosis:    PT Problem List:   PT Treatment Interventions:     PT Goals Acute Rehab PT Goals Pt will go Supine/Side to Sit: with supervision PT Goal: Supine/Side to Sit - Progress: Progressing toward goal Pt will go Sit to Stand: with modified independence PT Goal: Sit to Stand - Progress: Progressing toward goal Pt will go Stand to Sit: with modified independence PT Goal: Stand to Sit - Progress: Progressing toward goal Pt will Ambulate: 51 - 150  feet;with supervision;with rolling walker PT Goal: Ambulate - Progress: Progressing toward goal  Visit Information  Last PT Received On: 10/01/12 Assistance Needed: +1    Subjective Data  Subjective: I am going to be carried to [facility] by ambulance   Cognition  Cognition Overall Cognitive Status: Appears within functional limits for tasks assessed/performed    Balance     End of Session PT - End of Session Activity Tolerance: Patient tolerated treatment well Patient left: in chair;with call bell/phone within reach Nurse Communication: Mobility status   GP     Arthur Lutz 10/01/2012, 9:27 AM

## 2012-10-01 NOTE — Discharge Summary (Signed)
Physician Discharge Summary   Patient ID: Arthur Lutz MRN: [id number] DOB/AGE: 03/02/1941 72 y.o.  Admit date: 09/28/2012 Discharge date: 10/01/2012  Primary Diagnosis:  Osteoarthritis Left knee  Admission Diagnoses:  Past Medical History  Diagnosis Date  . Hypertension   . Diabetes mellitus   . Allergic rhinitis   . CAD (coronary artery disease)   . CAD (coronary artery disease), NEW 08/07/11 cutting balloon PTCA to 95% ostial diag. and 90%  proximal LCX.  prior cutting balloon and to RCA  & ostium of ostial diag.  08/07/2011  . HTN (hypertension) 08/07/2011  . High cholesterol   . Angina   . Heart attack ~ 2009    "mild; did not affect the heart muscle"  . Shortness of breath on exertion   . Arthritis   . 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 Melanoma of skin ~ 2005    "level 2; on my back"  . Bladder cancer 11/2005  . DOE (dyspnea on exertion) 08/07/2011  . Exertional angina 08/07/2011  . Hyperlipemia, intolerant to statins. 08/07/2011  . Arthritis of knee, left, needs total Knee in future with Dr. Lequita Halt  08/07/2011  . Portacath in place 08/11/2012  . Heart murmur    Discharge Diagnoses:   Principal Problem:   OA (osteoarthritis) of knee Active Problems:   Postoperative anemia due to acute blood loss  Estimated body mass index is 34.17 kg/(m^2) as calculated from the following:   Height as of this encounter: 6' (1.829 m).   Weight as of this encounter: 114.306 kg (252 lb).  Classification of overweight in adults according to BMI (WHO, 1998)   Procedure:  Procedure(s) (LRB): TOTAL KNEE ARTHROPLASTY (Left)   Consults: None  HPI: Arthur Lutz is a 72 y.o. year old male with end stage OA of his left knee with progressively worsening pain and dysfunction. He has constant pain, with activity and at rest and significant functional deficits with difficulties even with ADLs. He has had extensive non-op management  including analgesics, injections of cortisone and viscosupplements, and home exercise program, but remains in significant pain with significant dysfunction. Radiographs show bone on bone arthritis medial and patellofemoral with varus deformity. He presents now for left Total Knee Arthroplasty.   Laboratory Data: Admission on 09/28/2012  Component Date Value Range Status  . ABO/RH(D) 09/28/2012 A NEG   Final  . Antibody Screen 09/28/2012 NEG   Final  . Sample Expiration 09/28/2012 10/01/2012   Final  . ABO/RH(D) 09/28/2012 A NEG   Final  . Glucose-Capillary 09/28/2012 121* 70 - 99 mg/dL Final  . Glucose-Capillary 09/28/2012 121* 70 - 99 mg/dL Final  . WBC 08/65/7846 8.1  4.0 - 10.5 K/uL Final  . RBC 09/29/2012 3.55* 4.22 - 5.81 MIL/uL Final  . Hemoglobin 09/29/2012 11.2* 13.0 - 17.0 g/dL Final  . HCT 96/29/5284 33.5* 39.0 - 52.0 % Final  . MCV 09/29/2012 94.4  78.0 - 100.0 fL Final  . MCH 09/29/2012 31.5  26.0 - 34.0 pg Final  . MCHC 09/29/2012 33.4  30.0 - 36.0 g/dL Final  . RDW 13/24/4010 13.5  11.5 - 15.5 % Final  . Platelets 09/29/2012 169  150 - 400 K/uL Final  . Sodium 09/29/2012 136  135 - 145 mEq/L Final  . Potassium 09/29/2012 3.7  3.5 - 5.1 mEq/L Final  . Chloride 09/29/2012 100  96 - 112 mEq/L Final  . CO2  09/29/2012 29  19 - 32 mEq/L Final  . Glucose, Bld 09/29/2012 152* 70 - 99 mg/dL Final  . BUN 40/98/1191 15  6 - 23 mg/dL Final  . Creatinine, Ser 09/29/2012 0.90  0.50 - 1.35 mg/dL Final  . Calcium 47/82/9562 8.2* 8.4 - 10.5 mg/dL Final  . GFR calc non Af Amer 09/29/2012 85* >90 mL/min Final  . GFR calc Af Amer 09/29/2012 >90  >90 mL/min Final   Comment:                                 The eGFR has been calculated                          using the CKD EPI equation.                          This calculation has not been                          validated in all clinical                          situations.                          eGFR's persistently                           <90 mL/min signify                          possible Chronic Kidney Disease.  . Glucose-Capillary 09/28/2012 125* 70 - 99 mg/dL Final  . Glucose-Capillary 09/28/2012 163* 70 - 99 mg/dL Final  . Glucose-Capillary 09/29/2012 133* 70 - 99 mg/dL Final  . Comment 1 13/02/6577 Notify RN   Final  . Comment 2 09/29/2012 Documented in Chart   Final  . Glucose-Capillary 09/29/2012 204* 70 - 99 mg/dL Final  . WBC 46/96/2952 9.7  4.0 - 10.5 K/uL Final  . RBC 09/30/2012 3.22* 4.22 - 5.81 MIL/uL Final  . Hemoglobin 09/30/2012 10.1* 13.0 - 17.0 g/dL Final  . HCT 84/13/2440 30.5* 39.0 - 52.0 % Final  . MCV 09/30/2012 94.7  78.0 - 100.0 fL Final  . MCH 09/30/2012 31.4  26.0 - 34.0 pg Final  . MCHC 09/30/2012 33.1  30.0 - 36.0 g/dL Final  . RDW 05/25/2535 13.2  11.5 - 15.5 % Final  . Platelets 09/30/2012 180  150 - 400 K/uL Final  . Sodium 09/30/2012 138  135 - 145 mEq/L Final  . Potassium 09/30/2012 3.6  3.5 - 5.1 mEq/L Final  . Chloride 09/30/2012 103  96 - 112 mEq/L Final  . CO2 09/30/2012 27  19 - 32 mEq/L Final  . Glucose, Bld 09/30/2012 140* 70 - 99 mg/dL Final  . BUN 64/40/3474 15  6 - 23 mg/dL Final  . Creatinine, Ser 09/30/2012 0.88  0.50 - 1.35 mg/dL Final  . Calcium 25/95/6387 8.8  8.4 - 10.5 mg/dL Final  . GFR calc non Af Amer 09/30/2012 86* >90 mL/min Final  . GFR calc Af Amer 09/30/2012 >90  >90 mL/min Final   Comment:  The eGFR has been calculated                          using the CKD EPI equation.                          This calculation has not been                          validated in all clinical                          situations.                          eGFR's persistently                          <90 mL/min signify                          possible Chronic Kidney Disease.  . Glucose-Capillary 09/29/2012 164* 70 - 99 mg/dL Final  . Glucose-Capillary 09/29/2012 194* 70 - 99 mg/dL Final  . Glucose-Capillary 09/30/2012 126* 70 - 99  mg/dL Final  . Glucose-Capillary 09/30/2012 159* 70 - 99 mg/dL Final  . WBC 16/04/9603 6.6  4.0 - 10.5 K/uL Final  . RBC 10/01/2012 3.08* 4.22 - 5.81 MIL/uL Final  . Hemoglobin 10/01/2012 9.8* 13.0 - 17.0 g/dL Final  . HCT 54/03/8118 28.6* 39.0 - 52.0 % Final  . MCV 10/01/2012 92.9  78.0 - 100.0 fL Final  . MCH 10/01/2012 31.8  26.0 - 34.0 pg Final  . MCHC 10/01/2012 34.3  30.0 - 36.0 g/dL Final  . RDW 14/78/2956 13.3  11.5 - 15.5 % Final  . Platelets 10/01/2012 167  150 - 400 K/uL Final  . Glucose-Capillary 09/30/2012 129* 70 - 99 mg/dL Final  . Glucose-Capillary 09/30/2012 139* 70 - 99 mg/dL Final  . Comment 1 21/30/8657 Notify RN   Final  Hospital Outpatient Visit on 09/21/2012  Component Date Value Range Status  . aPTT 09/21/2012 32  24 - 37 seconds Final  . WBC 09/21/2012 5.4  4.0 - 10.5 K/uL Final  . RBC 09/21/2012 4.58  4.22 - 5.81 MIL/uL Final  . Hemoglobin 09/21/2012 14.6  13.0 - 17.0 g/dL Final  . HCT 84/69/6295 42.2  39.0 - 52.0 % Final  . MCV 09/21/2012 92.1  78.0 - 100.0 fL Final  . MCH 09/21/2012 31.9  26.0 - 34.0 pg Final  . MCHC 09/21/2012 34.6  30.0 - 36.0 g/dL Final  . RDW 28/41/3244 12.9  11.5 - 15.5 % Final  . Platelets 09/21/2012 197  150 - 400 K/uL Final  . Sodium 09/21/2012 138  135 - 145 mEq/L Final  . Potassium 09/21/2012 3.4* 3.5 - 5.1 mEq/L Final  . Chloride 09/21/2012 99  96 - 112 mEq/L Final  . CO2 09/21/2012 29  19 - 32 mEq/L Final  . Glucose, Bld 09/21/2012 133* 70 - 99 mg/dL Final  . BUN 07/31/7251 11  6 - 23 mg/dL Final  . Creatinine, Ser 09/21/2012 0.90  0.50 - 1.35 mg/dL Final  . Calcium 66/44/0347 9.5  8.4 - 10.5 mg/dL Final  . Total Protein 09/21/2012 6.7  6.0 - 8.3 g/dL Final  . Albumin 42/59/5638 3.9  3.5 -  5.2 g/dL Final  . AST 16/04/9603 20  0 - 37 U/L Final  . ALT 09/21/2012 27  0 - 53 U/L Final  . Alkaline Phosphatase 09/21/2012 76  39 - 117 U/L Final  . Total Bilirubin 09/21/2012 0.5  0.3 - 1.2 mg/dL Final  . GFR calc non Af Amer  09/21/2012 85* >90 mL/min Final  . GFR calc Af Amer 09/21/2012 >90  >90 mL/min Final   Comment:                                 The eGFR has been calculated                          using the CKD EPI equation.                          This calculation has not been                          validated in all clinical                          situations.                          eGFR's persistently                          <90 mL/min signify                          possible Chronic Kidney Disease.  Marland Kitchen Prothrombin Time 09/21/2012 12.3  11.6 - 15.2 seconds Final  . INR 09/21/2012 0.92  0.00 - 1.49 Final  . Color, Urine 09/21/2012 YELLOW  YELLOW Final  . APPearance 09/21/2012 CLEAR  CLEAR Final  . Specific Gravity, Urine 09/21/2012 1.012  1.005 - 1.030 Final  . pH 09/21/2012 5.5  5.0 - 8.0 Final  . Glucose, UA 09/21/2012 250* NEGATIVE mg/dL Final  . Hgb urine dipstick 09/21/2012 NEGATIVE  NEGATIVE Final  . Bilirubin Urine 09/21/2012 NEGATIVE  NEGATIVE Final  . Ketones, ur 09/21/2012 NEGATIVE  NEGATIVE mg/dL Final  . Protein, ur 54/03/8118 NEGATIVE  NEGATIVE mg/dL Final  . Urobilinogen, UA 09/21/2012 0.2  0.0 - 1.0 mg/dL Final  . Nitrite 14/78/2956 NEGATIVE  NEGATIVE Final  . Leukocytes, UA 09/21/2012 NEGATIVE  NEGATIVE Final   MICROSCOPIC NOT DONE ON URINES WITH NEGATIVE PROTEIN, BLOOD, LEUKOCYTES, NITRITE, OR GLUCOSE <1000 mg/dL.  Marland Kitchen MRSA, PCR 09/21/2012 NEGATIVE  NEGATIVE Final  . Staphylococcus aureus 09/21/2012 NEGATIVE  NEGATIVE Final   Comment:                                 The Xpert SA Assay (FDA                          approved for NASAL specimens                          in patients over 20 years of age),  is one component of                          a comprehensive surveillance                          program.  Test performance has                          been validated by [facility] for patients greater                           than or equal to 56 year old.                          It is not intended                          to diagnose infection nor to                          guide or monitor treatment.     X-Rays:No results found.  EKG: Orders placed in visit on 08/08/11  . EKG 12-LEAD     Hospital Course: Arthur Lutz is a 72 y.o. who was admitted to [facility]. They were brought to the operating room on 09/28/2012 and underwent Procedure(s): TOTAL KNEE ARTHROPLASTY.  Patient tolerated the procedure well and was later transferred to the recovery room and then to the orthopaedic floor for postoperative care.  They were given PO and IV analgesics for pain control following their surgery.  They were given 24 hours of postoperative antibiotics of  Anti-infectives   Start     Dose/Rate Route Frequency Ordered Stop   09/28/12 1500  ceFAZolin (ANCEF) IVPB 2 g/50 mL premix     2 g 100 mL/hr over 30 Minutes Intravenous Every 6 hours 09/28/12 1159 09/28/12 2155   09/28/12 0645  ceFAZolin (ANCEF) IVPB 2 g/50 mL premix     2 g 100 mL/hr over 30 Minutes Intravenous On call to O.R. 09/28/12 1191 09/28/12 0858     and started on DVT prophylaxis in the form of Xarelto.   PT and OT were ordered for total joint protocol.  Discharge planning consulted to help with postop disposition and equipment needs.  Patient had a tough night on the evening of surgery with pain.  They started to get up OOB with therapy on day one felling a little better. Hemovac drain was pulled without difficulty.  Continued to work with therapy into day two. Unfortunately, his wife developed some acute medical issues and would not be able to take care of him at home.  He then wanted to look into a SNF.  Social worker got involved. Dressing was changed on day two and the incision was healing well.  By day three, the patient had progressed with therapy and meeting their goals.  Incision was healing well.  Patient was seen in rounds and was ready  to go tot he SNF.   Discharge Medications: Prior to Admission medications   Medication Sig Start Date End Date Taking? Authorizing Provider  amLODipine (NORVASC) 5 MG tablet Take 1 tablet by mouth daily after breakfast.  03/31/11  Yes Historical Provider, MD  diphenhydrAMINE (BENADRYL) 25 mg capsule Take 25 mg by mouth at bedtime as needed for sleep.   Yes Historical Provider, MD  isosorbide mononitrate (IMDUR) 30 MG 24 hr tablet Take 1 tablet by mouth daily after breakfast.  04/23/11  Yes Historical Provider, MD  losartan-hydrochlorothiazide (HYZAAR) 50-12.5 MG per tablet Take 1 tablet by mouth daily after breakfast.   Yes Historical Provider, MD  metFORMIN (GLUCOPHAGE) 500 MG tablet Take 500 mg by mouth 2 (two) times daily with a meal.   Yes Historical Provider, MD  triazolam (HALCION) 0.25 MG tablet Take 0.25 mg by mouth at bedtime as needed (sleep).   Yes Historical Provider, MD  acetaminophen (TYLENOL) 325 MG tablet Take 2 tablets (650 mg total) by mouth every 6 (six) hours as needed. 10/01/12   Alexzandrew Julien Girt, PA-C  bisacodyl (DULCOLAX) 10 MG suppository Place 1 suppository (10 mg total) rectally daily as needed. 10/01/12   Alexzandrew Perkins, PA-C  docusate sodium 100 MG CAPS Take 100 mg by mouth 2 (two) times daily. 10/01/12   Alexzandrew Julien Girt, PA-C  methocarbamol (ROBAXIN) 500 MG tablet Take 1 tablet (500 mg total) by mouth every 6 (six) hours as needed. 10/01/12   Alexzandrew Perkins, PA-C  metoprolol succinate (TOPROL-XL) 25 MG 24 hr tablet Take 12.5 mg by mouth daily after breakfast.    Historical Provider, MD  ondansetron (ZOFRAN) 4 MG tablet Take 1 tablet (4 mg total) by mouth every 6 (six) hours as needed for nausea. 10/01/12   Alexzandrew Perkins, PA-C  oxyCODONE (OXY IR/ROXICODONE) 5 MG immediate release tablet Take 1-2 tablets (5-10 mg total) by mouth every 3 (three) hours as needed. 10/01/12   Alexzandrew Perkins, PA-C  polyethylene glycol (MIRALAX / GLYCOLAX) packet Take 17 g by  mouth daily as needed. 10/01/12   Alexzandrew Julien Girt, PA-C  rivaroxaban (XARELTO) 10 MG TABS tablet Take 1 tablet (10 mg total) by mouth daily with breakfast. Take Xarelto for two and a half more weeks, then discontinue Xarelto. Once the patient has completed the Xarelto, they may resume the 81 mg Aspirin and the Plavix 75 mg. 10/01/12   Alexzandrew Julien Girt, PA-C  traMADol (ULTRAM) 50 MG tablet Take 1-2 tablets (50-100 mg total) by mouth every 6 (six) hours as needed (mild pain). 10/01/12   Alexzandrew Julien Girt, PA-C    Diet: Cardiac diet and Diabetic diet Activity:WBAT Follow-up:in 2 weeks Disposition - Skilled nursing facility [facility] Discharged Condition: good   Discharge Orders   Future Orders Complete By Expires     Call MD / Call 911  As directed     Comments:      If you experience chest pain or shortness of breath, CALL 911 and be transported to the hospital emergency room.  If you develope a fever above 101 F, pus (white drainage) or increased drainage or redness at the wound, or calf pain, call your surgeon's office.    Change dressing  As directed     Comments:      Change dressing daily with sterile 4 x 4 inch gauze dressing and apply TED hose. Do not submerge the incision under water.    Constipation Prevention  As directed     Comments:      Drink plenty of fluids.  Prune juice may be helpful.  You may use a stool softener, such as Colace (over the counter) 100 mg  twice a day.  Use MiraLax (over the counter) for constipation as needed.    Diet - low sodium heart healthy  As directed     Diet Carb Modified  As directed     Discharge instructions  As directed     Comments:      Pick up stool softner and laxative for home. Do not submerge incision under water. May shower. Continue to use ice for pain and swelling from surgery.  Take Xarelto for two and a half more weeks, then discontinue Xarelto. Once the patient has completed the Xarelto, they may resume the 81 mg  Aspirin and the Plavix 75 mg.    Do not put a pillow under the knee. Place it under the heel.  As directed     Do not sit on low chairs, stoools or toilet seats, as it may be difficult to get up from low surfaces  As directed     Driving restrictions  As directed     Comments:      No driving until released by the physician.    Increase activity slowly as tolerated  As directed     Lifting restrictions  As directed     Comments:      No lifting until released by the physician.    Patient may shower  As directed     Comments:      You may shower without a dressing once there is no drainage.  Do not wash over the wound.  If drainage remains, do not shower until drainage stops.    TED hose  As directed     Comments:      Use stockings (TED hose) for 3 weeks on both leg(s).  You may remove them at night for sleeping.    Weight bearing as tolerated  As directed         Medication List    STOP taking these medications       aspirin 81 MG tablet     clopidogrel 75 MG tablet  Commonly known as:  PLAVIX     ibuprofen 100 MG tablet  Commonly known as:  ADVIL,MOTRIN     Ubiquinol 300 MG Caps      TAKE these medications       acetaminophen 325 MG tablet  Commonly known as:  TYLENOL  Take 2 tablets (650 mg total) by mouth every 6 (six) hours as needed.     amLODipine 5 MG tablet  Commonly known as:  NORVASC  Take 1 tablet by mouth daily after breakfast.     bisacodyl 10 MG suppository  Commonly known as:  DULCOLAX  Place 1 suppository (10 mg total) rectally daily as needed.     diphenhydrAMINE 25 mg capsule  Commonly known as:  BENADRYL  Take 25 mg by mouth at bedtime as needed for sleep.     DSS 100 MG Caps  Take 100 mg by mouth 2 (two) times daily.     isosorbide mononitrate 30 MG 24 hr tablet  Commonly known as:  IMDUR  Take 1 tablet by mouth daily after breakfast.     losartan-hydrochlorothiazide 50-12.5 MG per tablet  Commonly known as:  HYZAAR  Take 1 tablet by  mouth daily after breakfast.     metFORMIN 500 MG tablet  Commonly known as:  GLUCOPHAGE  Take 500 mg by mouth 2 (two) times daily with a meal.     methocarbamol 500 MG tablet  Commonly known  as:  ROBAXIN  Take 1 tablet (500 mg total) by mouth every 6 (six) hours as needed.     metoprolol succinate 25 MG 24 hr tablet  Commonly known as:  TOPROL-XL  Take 12.5 mg by mouth daily after breakfast.     ondansetron 4 MG tablet  Commonly known as:  ZOFRAN  Take 1 tablet (4 mg total) by mouth every 6 (six) hours as needed for nausea.     oxyCODONE 5 MG immediate release tablet  Commonly known as:  Oxy IR/ROXICODONE  Take 1-2 tablets (5-10 mg total) by mouth every 3 (three) hours as needed.     polyethylene glycol packet  Commonly known as:  MIRALAX / GLYCOLAX  Take 17 g by mouth daily as needed.     rivaroxaban 10 MG Tabs tablet  Commonly known as:  XARELTO  Take 1 tablet (10 mg total) by mouth daily with breakfast. Take Xarelto for two and a half more weeks, then discontinue Xarelto.  Once the patient has completed the Xarelto, they may resume the 81 mg Aspirin and the Plavix 75 mg.     traMADol 50 MG tablet  Commonly known as:  ULTRAM  Take 1-2 tablets (50-100 mg total) by mouth every 6 (six) hours as needed (mild pain).     triazolam 0.25 MG tablet  Commonly known as:  HALCION  Take 0.25 mg by mouth at bedtime as needed (sleep).           Follow-up Information   Follow up with Loanne Drilling, MD. Schedule an appointment as soon as possible for a visit in 2 weeks.   Contact information:   [address] [place] [address] [phone]       Signed: Patrica Duel 10/01/2012, 7:22 AM

## 2012-10-01 NOTE — Progress Notes (Signed)
Discharge summary sent to payer through [facility]  

## 2012-10-01 NOTE — Progress Notes (Signed)
Occupational Therapy Treatment Patient Details Name: Arthur Lutz MRN: [id number] DOB: 02/26/1941 Today's Date: 10/01/2012 Time: [phone] OT Time Calculation (min): 9 min  OT Assessment / Plan / Recommendation Comments on Treatment Session [facility] as he needs to be I with ADL activity     Follow Up Recommendations  SNF    Barriers to Discharge       Equipment Recommendations  None recommended by OT       Frequency Min 2X/week   Plan Discharge plan needs to be updated    Precautions / Restrictions Precautions Precautions: Knee Required Braces or Orthoses: Knee Immobilizer - Left       ADL  Toilet Transfer: Performed;Min guard Toilet Transfer Method: Sit to stand Toilet Transfer Equipment: Grab bars;Comfort height toilet Toileting - Clothing Manipulation and Hygiene: Performed;Minimal assistance Where Assessed - Engineer, mining and Hygiene: Standing      OT Goals ADL Goals ADL Goal: Statistician - Progress: Progressing toward goals ADL Goal: Toileting - Clothing Manipulation - Progress: Progressing toward goals ADL Goal: Toileting - Hygiene - Progress: Progressing toward goals  Visit Information  Last OT Received On: 10/01/12 Assistance Needed: +1    Subjective Data  Subjective: Pt has decided to go to SNF as wife not able to help at this time      Cognition  Cognition Overall Cognitive Status: Appears within functional limits for tasks assessed/performed Arousal/Alertness: Awake/alert Orientation Level: Appears intact for tasks assessed Behavior During Session: [facility] for tasks performed    Mobility  Bed Mobility Supine to Sit: 4: Min assist;HOB elevated Details for Bed Mobility Assistance: Assist for LLE oof bed Transfers Transfers: Sit to Stand;Stand to Sit Sit to Stand: 4: Min guard;With upper extremity assist;From chair/3-in-1 Stand to Sit: 4: Min guard;With upper extremity assist;To chair/3-in-1;To  toilet Details for Transfer Assistance: cues for safety, LLE position to sit down.          End of Session OT - End of Session Activity Tolerance: Patient tolerated treatment well Patient left: in chair;with call bell/phone within reach  GO     [facility], [facility] 10/01/2012, 9:54 AM

## 2012-10-02 ENCOUNTER — Ambulatory Visit (HOSPITAL_COMMUNITY)
Admit: 2012-10-02 | Discharge: 2012-10-02 | Disposition: A | Discharge status: Home / Self Care | Payer: Medicare Other | Attending: Internal Medicine | Admitting: Internal Medicine

## 2012-10-02 DIAGNOSIS — R000 Tachycardia, unspecified: Secondary | ICD-10-CM | POA: Insufficient documentation

## 2012-10-02 DIAGNOSIS — E119 Type 2 diabetes mellitus without complications: Secondary | ICD-10-CM | POA: Insufficient documentation

## 2012-10-02 DIAGNOSIS — I10 Essential (primary) hypertension: Secondary | ICD-10-CM | POA: Insufficient documentation

## 2012-10-02 LAB — GLUCOSE, CAPILLARY
Glucose-Capillary: 166 mg/dL — ABNORMAL HIGH (ref 70–99)
Glucose-Capillary: 133 mg/dL — ABNORMAL HIGH (ref 70–99)
Glucose-Capillary: 145 mg/dL — ABNORMAL HIGH (ref 70–99)
Glucose-Capillary: 183 mg/dL — ABNORMAL HIGH (ref 70–99)

## 2012-10-02 MED ORDER — IOHEXOL 350 MG/ML SOLN
100.0000 mL | Freq: Once | INTRAVENOUS | Status: AC | PRN
  Administered 2012-10-02: 100 mL via INTRAVENOUS

## 2012-10-03 LAB — GLUCOSE, CAPILLARY
Glucose-Capillary: 136 mg/dL — ABNORMAL HIGH (ref 70–99)
Glucose-Capillary: 174 mg/dL — ABNORMAL HIGH (ref 70–99)
Glucose-Capillary: 143 mg/dL — ABNORMAL HIGH (ref 70–99)

## 2012-10-04 LAB — GLUCOSE, CAPILLARY: Glucose-Capillary: 175 mg/dL — ABNORMAL HIGH (ref 70–99)

## 2012-10-05 LAB — GLUCOSE, CAPILLARY
Glucose-Capillary: 184 mg/dL — ABNORMAL HIGH (ref 70–99)
Glucose-Capillary: 155 mg/dL — ABNORMAL HIGH (ref 70–99)

## 2012-10-06 LAB — GLUCOSE, CAPILLARY
Glucose-Capillary: 135 mg/dL — ABNORMAL HIGH (ref 70–99)
Glucose-Capillary: 155 mg/dL — ABNORMAL HIGH (ref 70–99)
Glucose-Capillary: 136 mg/dL — ABNORMAL HIGH (ref 70–99)

## 2012-10-08 LAB — GLUCOSE, CAPILLARY
Glucose-Capillary: 109 mg/dL — ABNORMAL HIGH (ref 70–99)
Glucose-Capillary: 156 mg/dL — ABNORMAL HIGH (ref 70–99)
Glucose-Capillary: 216 mg/dL — ABNORMAL HIGH (ref 70–99)

## 2012-10-09 LAB — GLUCOSE, CAPILLARY
Glucose-Capillary: 125 mg/dL — ABNORMAL HIGH (ref 70–99)
Glucose-Capillary: 145 mg/dL — ABNORMAL HIGH (ref 70–99)

## 2012-10-11 LAB — GLUCOSE, CAPILLARY: Glucose-Capillary: 120 mg/dL — ABNORMAL HIGH (ref 70–99)

## 2012-10-13 LAB — GLUCOSE, CAPILLARY
Glucose-Capillary: 117 mg/dL — ABNORMAL HIGH (ref 70–99)
Glucose-Capillary: 131 mg/dL — ABNORMAL HIGH (ref 70–99)

## 2012-10-14 LAB — GLUCOSE, CAPILLARY
Glucose-Capillary: 149 mg/dL — ABNORMAL HIGH (ref 70–99)
Glucose-Capillary: 145 mg/dL — ABNORMAL HIGH (ref 70–99)

## 2012-10-17 ENCOUNTER — Non-Acute Institutional Stay (SKILLED_NURSING_FACILITY): Payer: Medicare Other | Admitting: Internal Medicine

## 2012-10-17 DIAGNOSIS — I2510 Atherosclerotic heart disease of native coronary artery without angina pectoris: Secondary | ICD-10-CM

## 2012-10-17 DIAGNOSIS — Z96652 Presence of left artificial knee joint: Secondary | ICD-10-CM

## 2012-10-17 DIAGNOSIS — IMO0001 Reserved for inherently not codable concepts without codable children: Secondary | ICD-10-CM

## 2012-10-17 DIAGNOSIS — Z96659 Presence of unspecified artificial knee joint: Secondary | ICD-10-CM

## 2012-10-17 LAB — GLUCOSE, CAPILLARY: Glucose-Capillary: 134 mg/dL — ABNORMAL HIGH (ref 70–99)

## 2012-10-17 NOTE — Progress Notes (Signed)
Patient ID: Arthur Lutz, male   DOB: [birth year], 72 y.o.   MRN: [id number] Chief complaint; admission to SNF status post stay [facility] 09/28/2012 through 10/01/2012  History; a 72 year old man who was admitted electively for a left total knee replacement due to worsening pain and dysfunction. He underwent an elective total knee replacement by Dr. Lequita Halt. He has generally been rehabilitating well in the however he has noted some swelling of his left knee without a lot of pain. I note that he was on Keflex for 10 days however that is finished. Most recent lab work did not show an elevated white blood count and differential count was normal. I do not see any notes from orthopedics although I gather he was seen on March 17.  Past medical history is very extensive and includes hypertension, type 2 diabetes, coronary artery disease, hypertension, lipidemia, osteoarthritis, history of a melanoma on his back, bladder cancer,  Medication list is reviewed and includes amlodipine 5 mg daily, Imdur 30 mg daily, Hyzaar 50/12.5 one by mouth daily, metformin 500 twice a day, copper all XL 25 mg one half tablet daily, xarelto 10 mg by mouth daily for 2-1/2 weeks then to restart ASA 81 daily, Plavix 75 by mouth daily to start xarelto completed.   Review of system; 1] respiratory no shortness of breath. Note made of CT scan of the chest the negative for PE on 3/7 Cardiac; no chest pain GI; no abdominal pain In CBGs generally running in the 120-150 range in the morning 125-145 a.c. supper  Physical examination; Respiratory; clear air entry bilaterally with no wheeze Cardiac; heart sounds are normal there is no murmur no signs of CHF GI; somewhat distended but no masses no tenderness no liver no spleen Left knee; there is still somewhat up tense effusion in the left knee, mild erythema but no real tenderness. No evidence of a DVT in either leg, due to complete his anticoagulation tomorrow and then will resume  Lasix and aspirin   Impressions/plan #1 the knee itself still has an effusion however I think orthopedics felt that was making satisfactory improvement after a course of oral Keflex. The there is very little pain however there is still some erythema. #2 type 2 diabetes; can resume his metformin which seems to have been on hold since his contrast CT of the chest 2 weeks ago. #3 coronary artery disease no evidence that this is active  The patient is making plans for discharge this week period. I see no real issues.

## 2012-10-18 LAB — GLUCOSE, CAPILLARY: Glucose-Capillary: 113 mg/dL — ABNORMAL HIGH (ref 70–99)

## 2012-10-19 LAB — GLUCOSE, CAPILLARY
Glucose-Capillary: 145 mg/dL — ABNORMAL HIGH (ref 70–99)
Glucose-Capillary: 112 mg/dL — ABNORMAL HIGH (ref 70–99)
Glucose-Capillary: 118 mg/dL — ABNORMAL HIGH (ref 70–99)

## 2012-10-21 LAB — GLUCOSE, CAPILLARY: Glucose-Capillary: 94 mg/dL (ref 70–99)

## 2012-10-22 LAB — GLUCOSE, CAPILLARY: Glucose-Capillary: 112 mg/dL — ABNORMAL HIGH (ref 70–99)

## 2012-10-23 ENCOUNTER — Encounter: Payer: Self-pay | Admitting: Internal Medicine

## 2012-10-23 ENCOUNTER — Non-Acute Institutional Stay (SKILLED_NURSING_FACILITY): Payer: Medicare Other | Admitting: Internal Medicine

## 2012-10-23 DIAGNOSIS — E119 Type 2 diabetes mellitus without complications: Secondary | ICD-10-CM

## 2012-10-23 DIAGNOSIS — I25810 Atherosclerosis of coronary artery bypass graft(s) without angina pectoris: Secondary | ICD-10-CM

## 2012-10-23 DIAGNOSIS — M1712 Unilateral primary osteoarthritis, left knee: Secondary | ICD-10-CM

## 2012-10-23 DIAGNOSIS — M1710 Unilateral primary osteoarthritis, unspecified knee: Secondary | ICD-10-CM

## 2012-10-23 DIAGNOSIS — I6350 Cerebral infarction due to unspecified occlusion or stenosis of unspecified cerebral artery: Secondary | ICD-10-CM

## 2012-10-23 DIAGNOSIS — I10 Essential (primary) hypertension: Secondary | ICD-10-CM

## 2012-10-23 DIAGNOSIS — I639 Cerebral infarction, unspecified: Secondary | ICD-10-CM

## 2012-10-23 DIAGNOSIS — IMO0002 Reserved for concepts with insufficient information to code with codable children: Secondary | ICD-10-CM

## 2012-10-23 LAB — GLUCOSE, CAPILLARY: Glucose-Capillary: 119 mg/dL — ABNORMAL HIGH (ref 70–99)

## 2012-10-23 NOTE — Progress Notes (Signed)
Patient ID: Arthur Lutz, male   DOB: [birth year], 72 y.o.   MRN: [id number] Chief complaint-discharge note.  History of present illness.  Patient is a pleasant elderly resident here was here for rehabilitation after a left knee replacement. The this was due to severe osteoarthritis.  He tolerated the procedure well.  His rehabilitation course here was quite unremarkable he has made significant progress.  He was in anticoagulated with-this is now been switched to aspirin-he is also on Plavix with history of CVA.  Patient's other medical issues including coronary artery disease with history of MI has been stable during his stay here-no reports of increased shortness of breath or chest pain.  He is a type II diabetic on Glucophage and his sugars are satisfactory.  In regards to pain he does get Advil as well apparently  is helping significantly  He will be going home to a supportive family he does have a wife-however his wife does have health issues  Family medical social history as been reviewed.  Medications have been reviewed.  Review of systems.  General denying any fever or chills.  Respiratory-denies shortness protocol.  Cardiac-has not complained of any chest pain.  GI-no nausea vomiting diarrhea constipation noted recently.  Muscle skeletal-is made very good progress is ambulating very well with a walker.  Denies joint pain at this time.  Physical exam.  Vital signs stable as noted previously.  Gen. this is a well-nourished elderly male in no distress very pleasant.  The skin is warm and dry surgical site left knee there is postop scarring mild erythema this appears to. Certainly no sign of infection that I can see this appears to be doing well.  Chest is clear to auscultation without rhonchi rales or wheezes no labored breathing.  Heart-regular rate and rhythm without murmur gallop or rub.  Abdomen is somewhat obese soft nontender positive bowel  sounds.  Muscle skeletal-moves all extremities x4-he is ambulating well with walker I do not note any deformities again he has been very well with his rehabilitation.  Neurologic is grossly intact speech is clear.  Psych he is alert and oriented x3 pleasant and continues to be in good spirits.  Labs.  10/09/2012.  WBC 6.9 hemoglobin 10.1 platelets 367.  10/05/2012.  Sodium 137 potassium 3.6 BUN 17 creatinine 0.74.  CBGs-run largely in the low 100s to mid 100s.  Assessment and plan.  #1-severe osteoarthritis status post left knee replacement-doing very well with this will need PT OT at home is receiving pain relief mainly with Advil we'll defer to primary care provider about further measures.  #2 diabetes type 2-this appears to be stable continues on Glucophage for now.  #3 hypertension-systolic somewhat variable from about 120s to 150s it appears will defer to primary care provider he is on Norvasc-Lopressor as well as Hyzaar.  #4-history coronary artery disease-continues on aspirin and Plavix.--Clinically stable  #5 anemia-this appears to be stable with hemoglobin 10--most recent labs  #6-history CVA? -- again he is on anticoagulation with aspirin  and Plavix  .    Marland Kitchen

## 2012-10-26 ENCOUNTER — Ambulatory Visit (HOSPITAL_COMMUNITY)
Admit: 2012-10-26 | Discharge: 2012-10-26 | Disposition: A | Discharge status: Home / Self Care | Payer: Medicare Other | Source: Ambulatory Visit | Attending: Internal Medicine | Admitting: Internal Medicine

## 2012-10-26 DIAGNOSIS — M25569 Pain in unspecified knee: Secondary | ICD-10-CM | POA: Insufficient documentation

## 2012-10-26 DIAGNOSIS — M25559 Pain in unspecified hip: Secondary | ICD-10-CM | POA: Insufficient documentation

## 2012-10-26 DIAGNOSIS — E119 Type 2 diabetes mellitus without complications: Secondary | ICD-10-CM | POA: Insufficient documentation

## 2012-10-26 DIAGNOSIS — R29898 Other symptoms and signs involving the musculoskeletal system: Secondary | ICD-10-CM | POA: Insufficient documentation

## 2012-10-26 DIAGNOSIS — IMO0001 Reserved for inherently not codable concepts without codable children: Secondary | ICD-10-CM | POA: Insufficient documentation

## 2012-10-26 DIAGNOSIS — M6281 Muscle weakness (generalized): Secondary | ICD-10-CM | POA: Insufficient documentation

## 2012-10-26 DIAGNOSIS — I10 Essential (primary) hypertension: Secondary | ICD-10-CM | POA: Insufficient documentation

## 2012-10-26 DIAGNOSIS — M25669 Stiffness of unspecified knee, not elsewhere classified: Secondary | ICD-10-CM | POA: Insufficient documentation

## 2012-10-26 DIAGNOSIS — R262 Difficulty in walking, not elsewhere classified: Secondary | ICD-10-CM | POA: Insufficient documentation

## 2012-10-26 NOTE — Evaluation (Signed)
Physical Therapy Evaluation  Patient Details  Name: Arthur Lutz MRN: [id number] Date of Birth: 1940-10-25 Charge:  Eval Today's Date: 10/26/2012 Time: 1435-1520 PT Time Calculation (min): 45 min              Visit#: 1 of 12  Re-eval: 11/25/12 Assessment Diagnosis: L TKR Surgical Date: 09/28/12 Next MD Visit: 11/05/2012 Prior Therapy: SNF  Authorization: [employer]     Authorization Visit#: 1 of 10   Past Medical History:  Past Medical History  Diagnosis Date  . Hypertension   . Diabetes mellitus   . Allergic rhinitis   . CAD (coronary artery disease)   . CAD (coronary artery disease), NEW 08/07/11 cutting balloon PTCA to 95% ostial diag. and 90%  proximal LCX.  prior cutting balloon and to RCA  & ostium of ostial diag.  08/07/2011  . HTN (hypertension) 08/07/2011  . High cholesterol   . Angina   . Heart attack ~ 2009    "mild; did not affect the heart muscle"  . Shortness of breath on exertion   . Arthritis   . 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 Melanoma of skin ~ 2005    "level 2; on my back"  . Bladder cancer 11/2005  . DOE (dyspnea on exertion) 08/07/2011  . Exertional angina 08/07/2011  . Hyperlipemia, intolerant to statins. 08/07/2011  . Arthritis of knee, left, needs total Knee in future with Dr. Lequita Halt  08/07/2011  . Portacath in place 08/11/2012  . Heart murmur    Past Surgical History:  Past Surgical History  Procedure Laterality Date  . Total knee arthroplasty  11/2003    right  . Coronary angioplasty with stent placement  ~ 2009    "!"  . Coronary angioplasty with stent placement  08/07/11    "1"  . Inguinal hernia repair  ~ 1970    left  . Tonsillectomy      "as a child"  . Cataract extraction w/ intraocular lens implant  ~ 2010    right  . Partial knee arthroplasty  06/2003    right  . Knee arthroscopy  02/20/11    left  . Cold cup removal bladder lesion  11/2005    "malignant"  . Total  knee arthroplasty Left 09/28/2012    Procedure: TOTAL KNEE ARTHROPLASTY;  Surgeon: Loanne Drilling, MD;  Location: [facility];  Service: Orthopedics;  Laterality: Left;    Subjective Symptoms/Limitations Symptoms: Mr. Hence had his total knee replacement on 3/32014 . He was discharged to the [facility] on 10/01/2012 and was discharged to home on 10/23/2012.  He is now being referred to outpatient to retrun him to his previous functioning level.  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   How long can you sit comfortably?: able to sit for 15 minutes but this is mainly due to his restless leg syndrome. How long can you stand comfortably?: Able to stand for fifteen minutes. How long can you walk comfortably?: able to walk with his cane for 10-15 minutees. Pain Assessment Currently in Pain?: Yes Pain Score:   2 (highest pain has been a 5/10) Pain Orientation: Left Pain Type: Surgical pain Pain Onset: 1 to 4 weeks ago   Prior Functioning  Prior Function Leisure: Hobbies-yes (Comment) Comments: farming   Fall risk:  Assessed  Negative on all three Sensation/Coordination/Flexibility/Functional  Tests Functional Tests Functional Tests: LEFS 47/80  Assessment LLE AROM (degrees) Left Knee Extension: 12 Left Knee Flexion: 98 LLE PROM (degrees) Left Knee Extension: 9 Left Knee Flexion: 100 LLE Strength Left Hip Flexion: 5/5 Left Hip Extension: 3/5 Left Hip ABduction: 5/5 Left Hip ADduction: 5/5 Left Knee Flexion:  (4+/5) Left Knee Extension:  (4+/5) Left Ankle Dorsiflexion: 3+/5  Exercise/Treatments Mobility/Balance  Static Standing Balance Single Leg Stance - Right Leg: 10 Single Leg Stance - Left Leg: 10   Seated Long Arc Quad: Left;10 reps Other Seated Knee Exercises: Ankle DF/PF x 10 Supine Quad Sets: 10 reps Terminal Knee Extension: 10 reps Knee Extension: PROM Knee Flexion: PROM Prone  Hamstring Curl: 10 reps Hip Extension: 10 reps       Physical Therapy Assessment and Plan PT Assessment and Plan Clinical Impression Statement: [name] is a 72yo male S/P TKR who has decreased ROM, decreased strength and pain.  [name] will benefit from skilled therapy to address the above issues and return him to his previous functioning level. Pt will benefit from skilled therapeutic intervention in order to improve on the following deficits: Decreased activity tolerance;Decreased balance;Decreased range of motion;Decreased strength;Difficulty walking;Pain Rehab Potential: Good PT Frequency: Min 3X/week PT Treatment/Interventions: Therapeutic exercise;Therapeutic activities;Manual techniques;Modalities;Balance training PT Plan: Begin bike, minisqut, heel raise, rockerboard, terminal ext standing, knee flex standing , quad stretch next treatment.  Progress pt concentrating more on ROM initially.    Goals Home Exercise Program Pt will Perform Home Exercise Program: Independently PT Short Term Goals Time to Complete Short Term Goals: 2 weeks PT Short Term Goal 1: ROM 5 to 110 to allow normalized gait  PT Short Term Goal 2: able to sit with comfort for 60 minutes to watch a show PT Short Term Goal 3: able to walk for 20 minutes without increased pain PT Short Term Goal 4: Pain level no higher than a 3/10 PT Long Term Goals Time to Complete Long Term Goals: 4 weeks PT Long Term Goal 1: I in advance HEP PT Long Term Goal 2: Pt able to sit for two hours to allow pt to see a movie or travel Long Term Goal 3: able to walk for 60 minutes without difficulty to be able to go shopping and walk for a healthy lifestyle  Long Term Goal 4: ROM to 120 to allow pt to step into trucks PT Long Term Goal 5: strength wnl to allow pt to step into tractors. Additional PT Long Term Goals?: Yes PT Long Term Goal 6: Pain level to higher than a 1/10 80% of the day  Problem List Patient Active Problem List  Diagnosis  . Dyspnea  . CAD (coronary artery  disease), NEW 08/07/11 cutting balloon PTCA to 95% ostial diag. and 90%  proximal LCX.  prior cutting balloon to RCA  & ostium of ostial diag., 2009   . HTN (hypertension)  . DOE (dyspnea on exertion)  . Exertional angina  . Diabetes mellitus  . Hyperlipemia, intolerant to statins.  . Arthritis of knee, left, needs total Knee in future with Dr. Lequita Halt   . Unstable angina pectoris - increaseing frequency and intensity after initially angina free post PCI  . Portacath in place  . OA (osteoarthritis) of knee  . Postoperative anemia due to acute blood loss  . Stiffness of joint, not elsewhere classified, lower leg  . Weakness of left leg  . Difficulty in walking    General Behavior During Session: [facility] for tasks performed  Cognition: WFL for tasks performed PT Plan of Care PT Home Exercise Plan: given  GP Functional Assessment Tool Used: LEFS  Functional Limitation: Mobility: Walking and moving around Mobility: Walking and Moving Around Current Status (Z6109): At least 20 percent but less than 40 percent impaired, limited or restricted Mobility: Walking and Moving Around Goal Status [phone]): At least 1 percent but less than 20 percent impaired, limited or restricted  RUSSELL,CINDY 10/26/2012, 5:06 PM  Physician Documentation Your signature is required to indicate approval of the treatment plan as stated above.  Please sign and either send electronically or make a copy of this report for your files and return this physician signed original.   Please mark one 1.__approve of plan  2. ___approve of plan with the following conditions.   ______________________________                                                          _____________________ Physician Signature                                                                                                             Date

## 2012-10-28 ENCOUNTER — Ambulatory Visit (HOSPITAL_COMMUNITY)
Admission: RE | Admit: 2012-10-28 | Discharge: 2012-10-28 | Disposition: A | Discharge status: Home / Self Care | Payer: Medicare Other | Source: Ambulatory Visit | Attending: Internal Medicine | Admitting: Internal Medicine

## 2012-10-28 DIAGNOSIS — I10 Essential (primary) hypertension: Secondary | ICD-10-CM | POA: Insufficient documentation

## 2012-10-28 DIAGNOSIS — E119 Type 2 diabetes mellitus without complications: Secondary | ICD-10-CM | POA: Insufficient documentation

## 2012-10-28 DIAGNOSIS — IMO0001 Reserved for inherently not codable concepts without codable children: Secondary | ICD-10-CM | POA: Insufficient documentation

## 2012-10-28 DIAGNOSIS — R262 Difficulty in walking, not elsewhere classified: Secondary | ICD-10-CM | POA: Insufficient documentation

## 2012-10-28 DIAGNOSIS — M25559 Pain in unspecified hip: Secondary | ICD-10-CM | POA: Insufficient documentation

## 2012-10-28 DIAGNOSIS — M25569 Pain in unspecified knee: Secondary | ICD-10-CM | POA: Insufficient documentation

## 2012-10-28 DIAGNOSIS — M6281 Muscle weakness (generalized): Secondary | ICD-10-CM | POA: Insufficient documentation

## 2012-10-28 NOTE — Progress Notes (Signed)
Physical Therapy Treatment Patient Details  Name: Arthur Lutz MRN: [id number] Date of Birth: 01-May-1941  Today's Date: 10/28/2012 Time: 1100-1200 PT Time Calculation (min): 60 min  Visit#: 2 of 12  Re-eval: 11/25/12 Charges: Therex x 35' Manual x 10' Ice x 10'  Authorization: [employer]    Subjective: Symptoms/Limitations Symptoms: Pt states that he has not been sleeping well. Pain Assessment Currently in Pain?: Yes Pain Score:   2 Pain Location: Knee Pain Orientation: Left    Exercise/Treatments Standing Heel Raises: 10 reps;Limitations Heel Raises Limitations: toe raises x 10 Knee Flexion: 10 reps Terminal Knee Extension: 10 reps;Theraband Theraband Level (Terminal Knee Extension): Level 4 (Blue) Functional Squat: 10 reps Supine Quad Sets: 10 reps Short Arc Quad Sets: 10 reps Terminal Knee Extension: 10 reps Knee Extension: PROM Knee Flexion: PROM   Modalities Modalities: Cryotherapy Manual Therapy Manual Therapy: Myofascial release Joint Mobilization: Grade I-III A/P joint mobs to improve flexion and extension Myofascial Release: MFR to L hamstring to decrease pain and tightness Cryotherapy Number Minutes Cryotherapy: 10 Minutes Cryotherapy Location: Knee (Left) Type of Cryotherapy: Ice pack  Physical Therapy Assessment and Plan PT Assessment and Plan Clinical Impression Statement: Pt completes therex well after initial cueing and demonstration. Manual techniques completed to decrease pain and improve motion. Ice applied at end of session. Pt reports pain decrease to 2/10 at end of session. PT Plan: Continue to progress strength and ROM per PT POC.     Problem List Patient Active Problem List  Diagnosis  . Dyspnea  . CAD (coronary artery disease), NEW 08/07/11 cutting balloon PTCA to 95% ostial diag. and 90%  proximal LCX.  prior cutting balloon to RCA  & ostium of ostial diag., 2009   . HTN (hypertension)  . DOE (dyspnea on exertion)  .  Exertional angina  . Diabetes mellitus  . Hyperlipemia, intolerant to statins.  . Arthritis of knee, left, needs total Knee in future with Dr. Lequita Halt   . Unstable angina pectoris - increaseing frequency and intensity after initially angina free post PCI  . Portacath in place  . OA (osteoarthritis) of knee  . Postoperative anemia due to acute blood loss  . Stiffness of joint, not elsewhere classified, lower leg  . Weakness of left leg  . Difficulty in walking    PT - End of Session Activity Tolerance: Patient tolerated treatment well General Behavior During Session: [facility] for tasks performed Cognition: [facility] for tasks performed  Seth Bake, PTA  10/28/2012, 12:06 PM

## 2012-10-29 ENCOUNTER — Encounter (HOSPITAL_COMMUNITY): Payer: Medicare Other | Attending: Family Medicine

## 2012-10-29 DIAGNOSIS — I2510 Atherosclerotic heart disease of native coronary artery without angina pectoris: Secondary | ICD-10-CM

## 2012-10-29 DIAGNOSIS — Z9889 Other specified postprocedural states: Secondary | ICD-10-CM | POA: Insufficient documentation

## 2012-10-29 DIAGNOSIS — Z95828 Presence of other vascular implants and grafts: Secondary | ICD-10-CM

## 2012-10-29 DIAGNOSIS — Z452 Encounter for adjustment and management of vascular access device: Secondary | ICD-10-CM

## 2012-10-29 MED ORDER — HEPARIN SOD (PORK) LOCK FLUSH 100 UNIT/ML IV SOLN
INTRAVENOUS | Status: AC
  Filled 2012-10-29: qty 5

## 2012-10-29 MED ORDER — HEPARIN SOD (PORK) LOCK FLUSH 100 UNIT/ML IV SOLN
500.0000 [IU] | Freq: Once | INTRAVENOUS | Status: AC
  Administered 2012-10-29: 500 [IU] via INTRAVENOUS
  Filled 2012-10-29: qty 5

## 2012-10-29 MED ORDER — SODIUM CHLORIDE 0.9 % IJ SOLN
10.0000 mL | INTRAMUSCULAR | Status: DC | PRN
  Administered 2012-10-29: 10 mL via INTRAVENOUS
  Filled 2012-10-29: qty 10

## 2012-10-29 NOTE — Progress Notes (Signed)
Arthur Lutz presented for Portacath access and flush. Proper placement of portacath confirmed by CXR. Portacath located left chest wall accessed with  H 20 needle. Good blood return present. Portacath flushed with 20ml NS and 500U/39ml Heparin and needle removed intact. Procedure without incident. Patient tolerated procedure well.

## 2012-10-30 ENCOUNTER — Ambulatory Visit (HOSPITAL_COMMUNITY)
Admission: RE | Admit: 2012-10-30 | Discharge: 2012-10-30 | Disposition: A | Discharge status: Home / Self Care | Payer: Medicare Other | Source: Ambulatory Visit | Attending: Internal Medicine | Admitting: Internal Medicine

## 2012-10-30 DIAGNOSIS — M25669 Stiffness of unspecified knee, not elsewhere classified: Secondary | ICD-10-CM

## 2012-10-30 DIAGNOSIS — R29898 Other symptoms and signs involving the musculoskeletal system: Secondary | ICD-10-CM

## 2012-10-30 DIAGNOSIS — R262 Difficulty in walking, not elsewhere classified: Secondary | ICD-10-CM

## 2012-10-30 NOTE — Progress Notes (Signed)
Physical Therapy Treatment Patient Details  Name: Arthur Lutz MRN: [id number] Date of Birth: 04-Jul-1941  Today's Date: 10/30/2012 Time: 1450-1555 PT Time Calculation (min): 65 min Charge:  There ex x 48; IP x 15 Visit#: 3 of 12  Re-eval: 11/25/12    Authorization: [employer]  Authorization Time Period:    Authorization Visit#: 2 of -10   Subjective: Symptoms/Limitations Symptoms: Pt states that he has not been sleeping well. Pain Assessment Pain Score:   4 Pain Location: Knee Pain Orientation: Left   Exercise/Treatments  Stretches Active Hamstring Stretch: 3 reps;30 seconds Quad Stretch: 3 reps;30 seconds Gastroc Stretch:  (slant board 3 x 30") Aerobic Stationary Bike: 7' @ 2.0   Standing Heel Raises: 10 reps;Limitations Knee Flexion: 10 reps Terminal Knee Extension: 10 reps;Theraband Theraband Level (Terminal Knee Extension): Level 4 (Blue) Lateral Step Up: 10 reps Forward Step Up: 10 reps Functional Squat: 10 reps SLS: 5 x max of 10"  Supine Quad Sets: 10 reps Terminal Knee Extension: 10 reps Knee Extension: PROM Knee Flexion: PROM Prone  Hamstring Curl: 10 reps;Limitations Hamstring Curl Limitations: 4# Hip Extension: 10 reps;Limitations Hip Extension Limitations: 4# Prone Knee Hang: 4 minutes;Weights Prone Knee Hang Weights (lbs): 4   Modalities Modalities: Cryotherapy Cryotherapy Number Minutes Cryotherapy: 15 Minutes Cryotherapy Location: Knee Type of Cryotherapy: Ice pack  Physical Therapy Assessment and Plan PT Assessment and Plan Clinical Impression Statement: Pt has decreased terminal extension.  Began stretching to improve extension as it is not improving as well as pt flextion.  Added new exercises with minimal cuing needed. Pt will benefit from skilled therapeutic intervention in order to improve on the following deficits: Decreased activity tolerance;Decreased balance;Decreased range of motion;Decreased strength;Difficulty  walking;Pain Rehab Potential: Good PT Frequency: Min 3X/week PT Treatment/Interventions: Therapeutic exercise;Therapeutic activities;Manual techniques;Modalities;Balance training PT Plan: begin sitting long sit     Goals Home Exercise Program Pt will Perform Home Exercise Program: Independently PT Goal: Perform Home Exercise Program - Progress: Met PT Short Term Goals Time to Complete Short Term Goals: 2 weeks PT Short Term Goal 1 - Progress: Progressing toward goal PT Short Term Goal 2: able to sit with comfort for 60 minutes to watch a show PT Short Term Goal 2 - Progress: Progressing toward goal PT Short Term Goal 3: able to walk for 20 minutes without increased pain PT Short Term Goal 3 - Progress: Progressing toward goal PT Short Term Goal 4: Pain level no higher than a 3/10 PT Short Term Goal 4 - Progress: Progressing toward goal PT Long Term Goals PT Long Term Goal 1: I in advance HEP PT Long Term Goal 1 - Progress: Progressing toward goal PT Long Term Goal 2: Pt able to sit for two hours to allow pt to see a movie or travel PT Long Term Goal 2 - Progress: Progressing toward goal Long Term Goal 3: able to walk for 60 minutes without difficulty to be able to go shopping and walk for a healthy lifestyle  Long Term Goal 3 Progress: Progressing toward goal Long Term Goal 4: ROM to 120 to allow pt to step into trucks Long Term Goal 4 Progress: Progressing toward goal PT Long Term Goal 5: strength wnl to allow pt to step into tractors. Long Term Goal 5 Progress: Progressing toward goal PT Long Term Goal 6: Pain level to higher than a 1/10 80% of the day  Problem List Patient Active Problem List  Diagnosis  . Dyspnea  . CAD (coronary artery disease), NEW  08/07/11 cutting balloon PTCA to 95% ostial diag. and 90%  proximal LCX.  prior cutting balloon to RCA  & ostium of ostial diag., 2009   . HTN (hypertension)  . DOE (dyspnea on exertion)  . Exertional angina  . Diabetes mellitus   . Hyperlipemia, intolerant to statins.  . Arthritis of knee, left, needs total Knee in future with Dr. Lequita Halt   . Unstable angina pectoris - increaseing frequency and intensity after initially angina free post PCI  . Portacath in place  . OA (osteoarthritis) of knee  . Postoperative anemia due to acute blood loss  . Stiffness of joint, not elsewhere classified, lower leg  . Weakness of left leg  . Difficulty in walking    PT - End of Session Activity Tolerance: Patient tolerated treatment well General Behavior During Session: [facility] for tasks performed Cognition: [facility] for tasks performed  GP    RUSSELL,CINDY 10/30/2012, 4:21 PM

## 2012-11-02 ENCOUNTER — Ambulatory Visit (HOSPITAL_COMMUNITY)
Admission: RE | Admit: 2012-11-02 | Discharge: 2012-11-02 | Disposition: A | Discharge status: Home / Self Care | Payer: Medicare Other | Source: Ambulatory Visit | Attending: Internal Medicine | Admitting: Internal Medicine

## 2012-11-02 NOTE — Progress Notes (Signed)
Physical Therapy Treatment Patient Details  Name: Arthur Lutz MRN: [id number] Date of Birth: 1941/04/23  Today's Date: 11/02/2012 Time: 0800-0900 PT Time Calculation (min): 60 min  Visit#: 4 of 12  Re-eval: 11/25/12 Charges: Therex x 35' Manual x 10' Ice x 10'  Authorization: [employer]  Authorization Visit#: 4 of -10   Subjective: Symptoms/Limitations Symptoms: Pt reports HEP compliance. Pain Assessment Currently in Pain?: Yes Pain Score:   2 Pain Location: Knee Pain Orientation: Left  Objective: L knee AROM 4-104  Exercise/Treatments Aerobic Stationary Bike: 7' @ 2.0 Machines for Strengthening   Plyometrics   Standing Heel Raises: 10 reps Heel Raises Limitations: toe raises x 15 Knee Flexion: 15 reps Terminal Knee Extension: 10 reps;Theraband Theraband Level (Terminal Knee Extension): Level 4 (Blue) Lateral Step Up: 10 reps;Hand Hold: 2;Step Height: 4";Left Forward Step Up: 10 reps;Hand Hold: 0;Step Height: 4";Left Functional Squat: 10 reps Rocker Board: 2 minutes Seated Other Seated Knee Exercises: Long sit hamstring stretch Other Seated Knee Exercises:   Supine Quad Sets: 10 reps Short Arc Quad Sets: 10 reps Terminal Knee Extension: 10 reps Knee Extension: PROM Knee Flexion: PROM  Modalities Modalities: Cryotherapy Manual Therapy Manual Therapy: Myofascial release Joint Mobilization: Grade I-III A/P joint mobs to improve flexion and extension Myofascial Release: MFR to anteror knee and hamstrings Cryotherapy Number Minutes Cryotherapy: 10 Minutes Cryotherapy Location: Knee (Left) Type of Cryotherapy: Ice pack  Physical Therapy Assessment and Plan PT Assessment and Plan Clinical Impression Statement: Pt display significant gains in left knee flexion and extension. Pt displays improve muscle coordination with TKE and quad sets. MFR completed to L anterior knee to decrease adhesions and to posteriors knee to decrease muscle tightness. PT Plan:  Continue to progress ROM and strength per PT POC.    Goals    Problem List Patient Active Problem List  Diagnosis  . Dyspnea  . CAD (coronary artery disease), NEW 08/07/11 cutting balloon PTCA to 95% ostial diag. and 90%  proximal LCX.  prior cutting balloon to RCA  & ostium of ostial diag., 2009   . HTN (hypertension)  . DOE (dyspnea on exertion)  . Exertional angina  . Diabetes mellitus  . Hyperlipemia, intolerant to statins.  . Arthritis of knee, left, needs total Knee in future with Dr. Lequita Halt   . Unstable angina pectoris - increaseing frequency and intensity after initially angina free post PCI  . Portacath in place  . OA (osteoarthritis) of knee  . Postoperative anemia due to acute blood loss  . Stiffness of joint, not elsewhere classified, lower leg  . Weakness of left leg  . Difficulty in walking    PT - End of Session Activity Tolerance: Patient tolerated treatment well General Behavior During Session: [facility] for tasks performed Cognition: [facility] for tasks performed  GP    [name] 11/02/2012, 9:19 AM

## 2012-11-04 ENCOUNTER — Ambulatory Visit (HOSPITAL_COMMUNITY)
Admission: RE | Admit: 2012-11-04 | Discharge: 2012-11-04 | Disposition: A | Discharge status: Home / Self Care | Payer: Medicare Other | Source: Ambulatory Visit | Attending: Internal Medicine | Admitting: Internal Medicine

## 2012-11-04 NOTE — Progress Notes (Signed)
Physical Therapy progress note  Patient Details  Name: Arthur Lutz MRN: [id number] Date of Birth: 1940-08-20  Today's Date: 11/04/2012 Time: 0810-0850 PT Time Calculation (min): 40 min              Visit#: 5 of 12  Re-eval: 11/25/12 Diagnosis: L TKR Dr. Despina Hick Surgical Date: 09/28/12 Next MD Visit: 11/05/2012 Authorization: [employer]    Authorization Visit#: 5 of -10  Charges;  MMT, ROM testing, icepack, manual 10'  Subjective Symptoms/Limitations Symptoms: Pt. states he's not having any pain today.  States he's been riding his bike at home and doing some exercises but not alot of them.  States he finally got a good nights sleep last night. Pain Assessment Currently in Pain?: No/denies   Assessment LLE AROM (degrees) Left Knee Extension: 6 (was 12 degrees) Left Knee Flexion: 107 (was 98 degrees) LLE PROM (degrees) Left Knee Extension: 4 (was 9 degrees) Left Knee Flexion: 112 (was 100 degrees)  LLE Strength Left Hip Flexion: 5/5 (was 5/5) Left Hip Extension: 4/5 (was 3/5) Left Hip ABduction: 5/5 (was 5/5) Left Hip ADduction: 5/5 (was 5/5) Left Knee Flexion: 5/5 (was 4+/5) Left Knee Extension: 5/5 (was 4+/5) Left Ankle Dorsiflexion: 4/5 (was 3+/5)  Exercise/Treatments Aerobic Stationary Bike: 8' @ 2.0   Modalities Modalities: Cryotherapy Manual Therapy Manual Therapy: Myofascial release Myofascial Release: MFR to anterior Lt. knee Cryotherapy Number Minutes Cryotherapy: 10 Minutes Cryotherapy Location: Knee Type of Cryotherapy: Ice pack  Physical Therapy Assessment and Plan PT Assessment and Plan Clinical Impression Statement: Continued gains in ROM and strength with little pain.  Pt reports pain increases with prolonged ambulation on concrete, no difficulties with sitting or getting up onto his tractor.  Pt. has met most of his STG's and progressing towards LTG's.  Pt. has 2 weeks remaining on current script. PT Plan: Continue to progress ROM and  strength per PT POC.    Goals Home Exercise Program Pt will Perform Home Exercise Program: Independently  PT Short Term Goals Time to Complete Short Term Goals: 2 weeks PT Short Term Goal 1: ROM 5 to 110 to allow normalized gait - Progress: Progressing toward goal PT Short Term Goal 2: able to sit with comfort for 60 minutes to watch a show Progress: Met PT Short Term Goal 3: able to walk for 20 minutes without increased pain -Progress: Met PT Short Term Goal 4 - Progress: Progressing toward goal  PT Long Term Goals Time to Complete Long Term Goals: 4 weeks PT Long Term Goal 1: I in advance HEP - Progress: Progressing toward goal PT Long Term Goal 2: Pt able to sit for two hours to allow pt to see a movie or travel- Progress: Progressing toward goal Long Term Goal 3: able to walk for 60 minutes without difficulty to be able to go shopping and walk for a healthy lifestyle-Progress: Progressing toward goal Long Term Goal 4: ROM to 120 to allow pt to step into trucks- Progress: Progressing toward goal PT Long Term Goal 5: strength wnl to allow pt to step into tractors.-Progress: Met   PT - End of Session Activity Tolerance: Patient tolerated treatment well General Behavior During Session: [facility] for tasks performed Cognition: [facility] for tasks performed   Lurena Nida, PTA/CLT 11/04/2012, 9:42 AM

## 2012-11-06 ENCOUNTER — Ambulatory Visit (HOSPITAL_COMMUNITY)
Admission: RE | Admit: 2012-11-06 | Discharge: 2012-11-06 | Disposition: A | Discharge status: Home / Self Care | Payer: Medicare Other | Source: Ambulatory Visit | Attending: Internal Medicine | Admitting: Internal Medicine

## 2012-11-06 DIAGNOSIS — R262 Difficulty in walking, not elsewhere classified: Secondary | ICD-10-CM

## 2012-11-06 DIAGNOSIS — R29898 Other symptoms and signs involving the musculoskeletal system: Secondary | ICD-10-CM

## 2012-11-06 DIAGNOSIS — M25669 Stiffness of unspecified knee, not elsewhere classified: Secondary | ICD-10-CM

## 2012-11-06 NOTE — Progress Notes (Signed)
Physical Therapy Treatment Patient Details  Name: Arthur Lutz MRN: [id number] Date of Birth: [birth year]  Today's Date: 11/06/2012 Time: 4098-1191 PT Time Calculation (min): 67 min [place]: There ex x 45; IP x 10 Visit#: 6 of 12  Re-eval: 11/25/12    Authorization: [employer]     Authorization Visit#: 6 of 10  Charge:  There ex x 45; IP x 10 Subjective: Symptoms/Limitations Symptoms: Pt states that he is sore. Pain Assessment Currently in Pain?: No/denies  Exercise/Treatments  Stretches Passive Hamstring Stretch: 2 reps;60 seconds (long sit) Quad Stretch: 3 reps;30 seconds Hip Flexor Stretch:  (on chair 3 x 30") Gastroc Stretch:  (slant board 30" x 3) Aerobic Stationary Bike: 8'@ 4.0 Standing Terminal Knee Extension: Strengthening;10 reps Rocker Board: 2 minutes SLS with Vectors:  (3 two finger hold x 10" each) Other Standing Knee Exercises: heel walk/toe walk, retro walk x 2 RT   Supine Quad Sets: 10 reps Terminal Knee Extension: 10 reps Knee Extension: PROM;10 reps Prone  Hip Extension: 10 reps Hip Extension Limitations: 5# Contract/Relax to Increase Flexion: 5 Prone Knee Hang: 4 minutes Prone Knee Hang Weights (lbs): 5   Cryotherapy Cryotherapy Location: Knee Type of Cryotherapy: Ice pack  Physical Therapy Assessment and Plan PT Assessment and Plan Clinical Impression Statement: Pt work out changed according to results of re-evaluation.  Pt needs strengthening in hip extensor, Anterior tibialis, balance and ROM.  New exerecises were added without difficulty. Pt will benefit from skilled therapeutic intervention in order to improve on the following deficits: Decreased activity tolerance;Decreased balance;Decreased range of motion;Decreased strength;Difficulty walking;Pain PT Plan: Continue with new program.    Goals Home Exercise Program Pt will Perform Home Exercise Program: Independently PT Goal: Perform Home Exercise Program - Progress: Met PT  Short Term Goals PT Short Term Goal 1: ROM 5 to 110 to allow normalized gait  PT Short Term Goal 1 - Progress: Progressing toward goal PT Short Term Goal 2: able to sit with comfort for 60 minutes to watch a show PT Short Term Goal 2 - Progress: Met PT Short Term Goal 3: able to walk for 20 minutes without increased pain PT Short Term Goal 3 - Progress: Met PT Short Term Goal 4: Pain level no higher than a 3/10 PT Short Term Goal 4 - Progress: Progressing toward goal PT Long Term Goals PT Long Term Goal 2: Pt able to sit for two hours to allow pt to see a movie or travel PT Long Term Goal 2 - Progress: Progressing toward goal Long Term Goal 3: able to walk for 60 minutes without difficulty to be able to go shopping and walk for a healthy lifestyle  Long Term Goal 3 Progress: Progressing toward goal Long Term Goal 4: ROM to 120 to allow pt to step into trucks Long Term Goal 4 Progress: Progressing toward goal PT Long Term Goal 5: strength wnl to allow pt to step into tractors. Long Term Goal 5 Progress: Met  Problem List Patient Active Problem List  Diagnosis  . Dyspnea  . CAD (coronary artery disease), NEW 08/07/11 cutting balloon PTCA to 95% ostial diag. and 90%  proximal LCX.  prior cutting balloon to RCA  & ostium of ostial diag., 2009   . HTN (hypertension)  . DOE (dyspnea on exertion)  . Exertional angina  . Diabetes mellitus  . Hyperlipemia, intolerant to statins.  . Arthritis of knee, left, needs total Knee in future with Dr. Lequita Halt   . Unstable angina pectoris -  increaseing frequency and intensity after initially angina free post PCI  . Portacath in place  . OA (osteoarthritis) of knee  . Postoperative anemia due to acute blood loss  . Stiffness of joint, not elsewhere classified, lower leg  . Weakness of left leg  . Difficulty in walking    PT - End of Session Activity Tolerance: Patient tolerated treatment well General Behavior During Session: [facility] for tasks  performed PT Plan of Care PT Patient Instructions: complete 100 quad sets a day.  GP    Ailea Rhatigan,CINDY 11/06/2012, 9:00 AM

## 2012-11-09 ENCOUNTER — Ambulatory Visit (HOSPITAL_COMMUNITY): Payer: Medicare Other | Admitting: *Deleted

## 2012-11-11 ENCOUNTER — Ambulatory Visit (HOSPITAL_COMMUNITY)
Admission: RE | Admit: 2012-11-11 | Discharge: 2012-11-11 | Disposition: A | Discharge status: Home / Self Care | Payer: Medicare Other | Source: Ambulatory Visit | Attending: Internal Medicine | Admitting: Internal Medicine

## 2012-11-11 NOTE — Progress Notes (Signed)
Physical Therapy Treatment Patient Details  Name: MONTFORD BARG MRN: [id number] Date of Birth: [birth year]  Today's Date: 11/11/2012 Time: 0800-0855 PT Time Calculation (min): 55 min  Visit#: 7 of 12  Re-eval: 11/25/12 Authorization: [employer]  Authorization Visit#: 7 of 10  Charges:  therex 33', manual 10', ice 10'  Subjective:  Pt. States he has no pain today.    Exercise/Treatments Stretches Passive Hamstring Stretch: 2 reps;60 seconds Quad Stretch: 3 reps;30 seconds Aerobic Stationary Bike: 8'@ 4.0 Standing Terminal Knee Extension: Engineer, manufacturing systems Board: 2 minutes SLS with Vectors: 5X5" fwd/bkwd Other Standing Knee Exercises: heel walk/toe walk, retro walk x 2 RT Other Standing Knee Exercises: side stepping with blue tband 2RT Seated   Supine Quad Sets: 10 reps Knee Extension: PROM;10 reps Knee Flexion: PROM Prone  Hip Extension: 15 reps Hip Extension Limitations: 5# Contract/Relax to Increase Flexion: 5 Prone Knee Hang: 5 minutes Prone Knee Hang Weights (lbs): 5   Modalities Modalities: Cryotherapy Manual Therapy Manual Therapy: Myofascial release Myofascial Release: To posterior knee medial>lateral while in prone hang Cryotherapy Number Minutes Cryotherapy: 10 Minutes Cryotherapy Location: Knee Type of Cryotherapy: Ice pack  Physical Therapy Assessment and Plan PT Assessment and Plan Clinical Impression Statement: Added MFR while prone lying to posterior knee.  Adhesions mostly lateral knee, general tightness entire posterior knee.  added side stepping with theraband and vector stance to strengthen stability mm in LE's.  Pt will benefit from skilled therapeutic intervention in order to improve on the following deficits: Decreased activity tolerance;Decreased balance;Decreased range of motion;Decreased strength;Difficulty walking;Pain PT Plan: Continue with new program.     Problem List Patient Active Problem List  Diagnosis  .  Dyspnea  . CAD (coronary artery disease), NEW 08/07/11 cutting balloon PTCA to 95% ostial diag. and 90%  proximal LCX.  prior cutting balloon to RCA  & ostium of ostial diag., 2009   . HTN (hypertension)  . DOE (dyspnea on exertion)  . Exertional angina  . Diabetes mellitus  . Hyperlipemia, intolerant to statins.  . Arthritis of knee, left, needs total Knee in future with Dr. Lequita Halt   . Unstable angina pectoris - increaseing frequency and intensity after initially angina free post PCI  . Portacath in place  . OA (osteoarthritis) of knee  . Postoperative anemia due to acute blood loss  . Stiffness of joint, not elsewhere classified, lower leg  . Weakness of left leg  . Difficulty in walking    PT - End of Session Activity Tolerance: Patient tolerated treatment well PT Plan of Care PT Patient Instructions: complete 100 quad sets a day.   Lurena Nida, PTA/CLT 11/11/2012, 8:49 AM

## 2012-11-13 ENCOUNTER — Ambulatory Visit (HOSPITAL_COMMUNITY)
Admission: RE | Admit: 2012-11-13 | Discharge: 2012-11-13 | Disposition: A | Discharge status: Home / Self Care | Payer: Medicare Other | Source: Ambulatory Visit | Attending: Internal Medicine | Admitting: Internal Medicine

## 2012-11-13 DIAGNOSIS — M25669 Stiffness of unspecified knee, not elsewhere classified: Secondary | ICD-10-CM

## 2012-11-13 DIAGNOSIS — R262 Difficulty in walking, not elsewhere classified: Secondary | ICD-10-CM

## 2012-11-13 DIAGNOSIS — R29898 Other symptoms and signs involving the musculoskeletal system: Secondary | ICD-10-CM

## 2012-11-13 NOTE — Progress Notes (Signed)
Physical Therapy Treatment Patient Details  Name: Arthur Lutz MRN: [id number] Date of Birth: 1940-11-27  Today's Date: 11/13/2012 Time: 3086-5784 PT Time Calculation (min): 60 min Charge: Manual 10', Therex 38', Ice 10'  Visit#: 8 of 12  Re-eval: 11/25/12    Authorization: [employer]  Authorization Time Period:    Authorization Visit#: 8 of 10   Subjective: Symptoms/Limitations Symptoms: Pt reported min Lt knee pain 1-2/10 Pain Assessment Currently in Pain?: Yes Pain Score:   2 Pain Location: Knee Pain Orientation: Left  Objective:  Exercise/Treatments Stretches Quad Stretch: 3 reps;30 seconds Gastroc Stretch: 3 reps;30 seconds;Limitations Gastroc Stretch Limitations: slantboard Aerobic Stationary Bike: 8'@ 4.0 Standing Terminal Knee Extension: Strengthening;15 reps;Theraband Theraband Level (Terminal Knee Extension): Level 4 (Blue) Rocker Board: 2 minutes;Limitations Rocker Board Limitations: R/L SLS with Vectors: 3x 10" with intermittent HHA Other Standing Knee Exercises: heel walk/toe walk, retro walk x 2 RT Other Standing Knee Exercises: side stepping with red theraband 5sets reps on 4 step, 3 step, 2 step then 1 step Supine Quad Sets: 10 reps Terminal Knee Extension: 10 reps Knee Extension: PROM;3 sets Knee Flexion: PROM;3 sets Prone  Contract/Relax to Increase Flexion: 5 Prone Knee Hang: 5 minutes Prone Knee Hang Weights (lbs): 5   Modalities Modalities: Cryotherapy Manual Therapy Joint Mobilization: Grade I-III A/P joint mobs to improve flexion and extension Myofascial Release: To posterior and lateral knee in prone knee hang x 5 minutes Cryotherapy Number Minutes Cryotherapy: 10 Minutes Cryotherapy Location: Knee Type of Cryotherapy: Ice pack  Physical Therapy Assessment and Plan PT Assessment and Plan Clinical Impression Statement: Pt able to complete therex for strengthening and stabiltiy with min cueing for form without difficulty.   Continued MFR wtih prone knee hanging to posterior knee, able to reduce fascial restriction lateral knee.  Improving joint mobility following manual joint mobs, increased AROM 6-115, PROM 118. PT Plan: Continue with new program.    Goals    Problem List Patient Active Problem List  Diagnosis  . Dyspnea  . CAD (coronary artery disease), NEW 08/07/11 cutting balloon PTCA to 95% ostial diag. and 90%  proximal LCX.  prior cutting balloon to RCA  & ostium of ostial diag., 2009   . HTN (hypertension)  . DOE (dyspnea on exertion)  . Exertional angina  . Diabetes mellitus  . Hyperlipemia, intolerant to statins.  . Arthritis of knee, left, needs total Knee in future with Dr. Lequita Halt   . Unstable angina pectoris - increaseing frequency and intensity after initially angina free post PCI  . Portacath in place  . OA (osteoarthritis) of knee  . Postoperative anemia due to acute blood loss  . Stiffness of joint, not elsewhere classified, lower leg  . Weakness of left leg  . Difficulty in walking    PT - End of Session Activity Tolerance: Patient tolerated treatment well General Behavior During Therapy: WFL for tasks assessed/performed Cognition: WFL for tasks performed  GP    Juel Burrow 11/13/2012, 10:45 AM

## 2012-11-16 ENCOUNTER — Ambulatory Visit (HOSPITAL_COMMUNITY)
Admission: RE | Admit: 2012-11-16 | Discharge: 2012-11-16 | Disposition: A | Discharge status: Home / Self Care | Payer: Medicare Other | Source: Ambulatory Visit | Attending: Internal Medicine | Admitting: Internal Medicine

## 2012-11-16 DIAGNOSIS — M25669 Stiffness of unspecified knee, not elsewhere classified: Secondary | ICD-10-CM

## 2012-11-16 DIAGNOSIS — R262 Difficulty in walking, not elsewhere classified: Secondary | ICD-10-CM

## 2012-11-16 DIAGNOSIS — R29898 Other symptoms and signs involving the musculoskeletal system: Secondary | ICD-10-CM

## 2012-11-16 NOTE — Progress Notes (Signed)
Physical Therapy Treatment Patient Details  Name: Arthur Lutz MRN: [id number] Date of Birth: [birth year]  Today's Date: 11/16/2012 Time: 0805-0907 PT Time Calculation (min): 62 min Charge:  Manual x 10; there ex x 33; IP  Visit#: 9 of 12  Re-eval: 11/25/12    Authorization: [employer]  Authorization Time Period:    Authorization Visit#: 9 of 10   Subjective: Symptoms/Limitations Symptoms: It still hurts at night quite a bit. Pain Assessment Pain Score:   3  Exercise/Treatments  Stretches Active Hamstring Stretch: 3 reps;30 seconds Passive Hamstring Stretch: 2 reps;60 seconds Gastroc Stretch: 3 reps;30 seconds;Limitations Gastroc Stretch Limitations: slantboard Aerobic Stationary Bike: 8"6.0   Standing Terminal Knee Extension: Strengthening;15 reps;Theraband Theraband Level (Terminal Knee Extension): Level 4 (Blue) Rocker Board: 2 minutes;Limitations Rocker Board Limitations: R/L SLS with Vectors: 3x 10" with intermittent HHA Other Standing Knee Exercises: heel walk/toe walk, retro walk x 2 RT Other Standing Knee Exercises: side stepping with red theraband 5sets reps on 4 step, 3 step, 2 step then 1 step Supine Quad Sets: 10 reps Terminal Knee Extension: 10 reps Knee Extension: PROM;3 sets   Prone  Contract/Relax to Increase Flexion: 5 Prone Knee Hang: Other (comment) Prone Knee Hang Weights (lbs): 10' with 4# wt with soft tissue massage.   Modalities Modalities: Cryotherapy Manual Therapy Manual Therapy: Massage Cryotherapy Number Minutes Cryotherapy: 10 Minutes Cryotherapy Location: Knee Type of Cryotherapy: Ice pack  Physical Therapy Assessment and Plan PT Assessment and Plan Clinical Impression Statement: ROM improved to active -6; passive -3.  This treatment focused mainly on extension.  Pt urged to ice more frequently to keep swelling down.   Pt will benefit from skilled therapeutic intervention in order to improve on the following deficits:  Decreased activity tolerance;Decreased balance;Decreased range of motion;Decreased strength;Difficulty walking;Pain Rehab Potential: Good PT Plan: Complete LEFS next treatment.    Goals Home Exercise Program PT Goal: Perform Home Exercise Program - Progress: Met PT Short Term Goals Time to Complete Short Term Goals: 2 weeks PT Short Term Goal 1: ROM 5 to 110 to allow normalized gait  (ROM to 6 degrees today) PT Short Term Goal 1 - Progress: Progressing toward goal PT Short Term Goal 2: able to sit with comfort for 60 minutes to watch a show PT Short Term Goal 2 - Progress: Met PT Short Term Goal 3: able to walk for 20 minutes without increased pain PT Short Term Goal 3 - Progress: Met PT Short Term Goal 4: Pain level no higher than a 3/10 PT Short Term Goal 4 - Progress: Met PT Long Term Goals Time to Complete Long Term Goals: 4 weeks PT Long Term Goal 1: I in advance HEP PT Long Term Goal 1 - Progress: Met PT Long Term Goal 2 - Progress: Met Long Term Goal 3: able to walk for 60 minutes without difficulty to be able to go shopping and walk for a healthy lifestyle  Long Term Goal 3 Progress: Met Long Term Goal 4: ROM to 120 to allow pt to step into trucks Long Term Goal 4 Progress: Progressing toward goal PT Long Term Goal 5: strength wnl to allow pt to step into tractors. Long Term Goal 5 Progress: Met Additional PT Long Term Goals?: Yes PT Long Term Goal 6: Pain level to higher than a 1/10 80% of the day-progressing  Problem List Patient Active Problem List  Diagnosis  . Dyspnea  . CAD (coronary artery disease), NEW 08/07/11 cutting balloon PTCA to 95% ostial diag.  and 90%  proximal LCX.  prior cutting balloon to RCA  & ostium of ostial diag., 2009   . HTN (hypertension)  . DOE (dyspnea on exertion)  . Exertional angina  . Diabetes mellitus  . Hyperlipemia, intolerant to statins.  . Arthritis of knee, left, needs total Knee in future with Dr. Lequita Halt   . Unstable angina  pectoris - increaseing frequency and intensity after initially angina free post PCI  . Portacath in place  . OA (osteoarthritis) of knee  . Postoperative anemia due to acute blood loss  . Stiffness of joint, not elsewhere classified, lower leg  . Weakness of left leg  . Difficulty in walking    PT - End of Session Activity Tolerance: Patient tolerated treatment well General Cognition: WFL for tasks performed  GP    Gigi Onstad,CINDY 11/16/2012, 8:58 AM

## 2012-11-18 ENCOUNTER — Ambulatory Visit (HOSPITAL_COMMUNITY)
Admission: RE | Admit: 2012-11-18 | Discharge: 2012-11-18 | Disposition: A | Discharge status: Home / Self Care | Payer: Medicare Other | Source: Ambulatory Visit | Attending: Internal Medicine | Admitting: Internal Medicine

## 2012-11-18 NOTE — Progress Notes (Signed)
Physical Therapy Treatment Patient Details  Name: MUHSIN DORIS MRN: [id number] Date of Birth: 1941/05/17  Today's Date: 11/18/2012 Time: 0800-0845 PT Time Calculation (min): 45 min  Visit#: 10 of 12  Re-eval: 11/25/12 Charges: Therex x 25' Manual x 8' Ice x 10'   Authorization: [employer]  Authorization Visit#: 10 of 10   Subjective: Symptoms/Limitations Symptoms: Pt reprots soreness in left calf from completing heel raises yesterday. Pain Assessment Currently in Pain?: Yes Pain Score:   2 Pain Location: Knee Pain Orientation: Left   Exercise/Treatments Aerobic Stationary Bike: 10'@7 .5 Standing Lateral Step Up: 10 reps;Step Height: 6";Left Step Down: 10 reps;Step Height: 6";Hand Hold: 1;Left Wall Squat: 10 reps;5 seconds Rocker Board: 2 minutes;Limitations Supine Quad Sets: 10 reps Knee Extension: PROM Knee Flexion: PROM   Modalities Modalities: Cryotherapy Manual Therapy Manual Therapy: Massage Joint Mobilization: Grade I-III A/P joint mobs to improve ROM Myofascial Release: To anterior and posterior knee to decrease restrictions and improve motion Cryotherapy Number Minutes Cryotherapy: 10 Minutes Cryotherapy Location: Knee (Left) Type of Cryotherapy: Ice pack  Physical Therapy Assessment and Plan PT Assessment and Plan Clinical Impression Statement: Tx focus on improving functional strength and extension. LEFS has improves 7 points. Pt achieved 120 degrees of active flexion this session. Manual techniques completed to anterior and posterior knee to improve motion and decrease restrictions. Ice applied at end of session.  Pt will benefit from skilled therapeutic intervention in order to improve on the following deficits: Decreased activity tolerance;Decreased balance;Decreased range of motion;Decreased strength;Difficulty walking;Pain Rehab Potential: Good PT Plan: Continue to progress extension and functional strength per PT POC.     Problem  List Patient Active Problem List  Diagnosis  . Dyspnea  . CAD (coronary artery disease), NEW 08/07/11 cutting balloon PTCA to 95% ostial diag. and 90%  proximal LCX.  prior cutting balloon to RCA  & ostium of ostial diag., 2009   . HTN (hypertension)  . DOE (dyspnea on exertion)  . Exertional angina  . Diabetes mellitus  . Hyperlipemia, intolerant to statins.  . Arthritis of knee, left, needs total Knee in future with Dr. Lequita Halt   . Unstable angina pectoris - increaseing frequency and intensity after initially angina free post PCI  . Portacath in place  . OA (osteoarthritis) of knee  . Postoperative anemia due to acute blood loss  . Stiffness of joint, not elsewhere classified, lower leg  . Weakness of left leg  . Difficulty in walking    PT - End of Session Activity Tolerance: Patient tolerated treatment well General Behavior During Therapy: WFL for tasks assessed/performed Cognition: WFL for tasks performed  GP Functional Limitation: Mobility: Walking and moving around Mobility: Walking and Moving Around Current Status ([id number]): At least 20 percent but less than 40 percent impaired, limited or restricted Mobility: Walking and Moving Around Goal Status [phone]): At least 1 percent but less than 20 percent impaired, limited or restricted  Seth Bake, PTA  11/18/2012, 9:03 AM

## 2012-11-20 ENCOUNTER — Ambulatory Visit (HOSPITAL_COMMUNITY)
Admission: RE | Admit: 2012-11-20 | Discharge: 2012-11-20 | Disposition: A | Discharge status: Home / Self Care | Payer: Medicare Other | Source: Ambulatory Visit | Attending: Internal Medicine | Admitting: Internal Medicine

## 2012-11-20 DIAGNOSIS — R262 Difficulty in walking, not elsewhere classified: Secondary | ICD-10-CM

## 2012-11-20 DIAGNOSIS — R29898 Other symptoms and signs involving the musculoskeletal system: Secondary | ICD-10-CM

## 2012-11-20 DIAGNOSIS — M25669 Stiffness of unspecified knee, not elsewhere classified: Secondary | ICD-10-CM

## 2012-11-20 NOTE — Progress Notes (Signed)
Physical Therapy Treatment Patient Details  Name: Arthur Lutz MRN: [id number] Date of Birth: 1940-09-13  Today's Date: 11/20/2012 Time: 0800-0858 PT Time Calculation (min): 58 min Charge: Therex 28', Manual 20', ice 10'  Visit#: 11 of 12  Re-eval: 11/25/12 Assessment Diagnosis: L TKR Surgical Date: 09/28/12 Next MD Visit: Aluisio May Prior Therapy: SN[facility]  Authorization: [employer]  Authorization Time Period:    Authorization Visit#: 11 of 20   Subjective: Symptoms/Limitations Symptoms: Pt reported Lt knee feeling good today, pain scale 2/10 Pain Assessment Currently in Pain?: Yes Pain Score:   2 Pain Location: Knee Pain Orientation: Left  Objective:   Exercise/Treatments Stretches Active Hamstring Stretch: 3 reps;30 seconds Quad Stretch: 3 reps;30 seconds Aerobic Stationary Bike: 10'@10  Standing Terminal Knee Extension: 15 reps;Theraband Theraband Level (Terminal Knee Extension): Level 4 (Blue) Lateral Step Up: Left;10 reps;Hand Hold: 2;Step Height: 6" Forward Step Up: 10 reps;Hand Hold: 0;Step Height: 6" Step Down: 10 reps;Step Height: 6";Hand Hold: 1;Left Other Standing Knee Exercises: heel walk/toe walk, retro walk x 2 RT Supine Quad Sets: 10 reps Terminal Knee Extension: 10 reps Knee Extension: PROM Knee Flexion: PROM Prone  Prone Knee Hang: Other (comment) Prone Knee Hang Weights (lbs): 10' with 5# wt with soft tissue massage. Other Prone Exercises: TKE 10x 10"   Modalities Modalities: Cryotherapy Manual Therapy Manual Therapy: Joint mobilization Joint Mobilization: Grade I-III patella mobs all directions and tib/fib Myofascial Release: To anterior and posterior knee to decrease restrictions and improve motion; STM x 10 to hamstrings during prone knee hang Cryotherapy Number Minutes Cryotherapy: 10 Minutes Cryotherapy Location: Knee Type of Cryotherapy: Ice pack  Physical Therapy Assessment and Plan PT Assessment and Plan Clinical  Impression Statement: Session focus on functional quad strengthening and manual techniques to reduce fascial restrictions and improve ROM. Added prone TKE with min cueing for technique. Ice applied end of session for pain and edema control. PT Plan: Re-eval next session.    Goals    Problem List Patient Active Problem List  Diagnosis  . Dyspnea  . CAD (coronary artery disease), NEW 08/07/11 cutting balloon PTCA to 95% ostial diag. and 90%  proximal LCX.  prior cutting balloon to RCA  & ostium of ostial diag., 2009   . HTN (hypertension)  . DOE (dyspnea on exertion)  . Exertional angina  . Diabetes mellitus  . Hyperlipemia, intolerant to statins.  . Arthritis of knee, left, needs total Knee in future with Dr. Lequita Halt   . Unstable angina pectoris - increaseing frequency and intensity after initially angina free post PCI  . Portacath in place  . OA (osteoarthritis) of knee  . Postoperative anemia due to acute blood loss  . Stiffness of joint, not elsewhere classified, lower leg  . Weakness of left leg  . Difficulty in walking    PT - End of Session Activity Tolerance: Patient tolerated treatment well General Behavior During Therapy: WFL for tasks assessed/performed Cognition: WFL for tasks performed  GP    Juel Burrow 11/20/2012, 1:14 PM

## 2012-11-23 ENCOUNTER — Ambulatory Visit (HOSPITAL_COMMUNITY)
Admission: RE | Admit: 2012-11-23 | Discharge: 2012-11-23 | Disposition: A | Discharge status: Home / Self Care | Payer: Medicare Other | Source: Ambulatory Visit | Attending: Internal Medicine | Admitting: Internal Medicine

## 2012-11-23 DIAGNOSIS — R262 Difficulty in walking, not elsewhere classified: Secondary | ICD-10-CM

## 2012-11-23 DIAGNOSIS — M25669 Stiffness of unspecified knee, not elsewhere classified: Secondary | ICD-10-CM

## 2012-11-23 DIAGNOSIS — R29898 Other symptoms and signs involving the musculoskeletal system: Secondary | ICD-10-CM

## 2012-11-23 NOTE — Evaluation (Addendum)
Physical Therapy Evaluation  Patient Details  Name: Arthur Lutz MRN: [id number] Date of Birth: [birth year]  Today's Date: 11/23/2012 Time: 0800-0850 PT Time Calculation (min): 50 min Charge:  MM test; ROM test; manual, IP             Visit#: 12 of 13  Re-eval: 11/25/12 Assessment Diagnosis: L TKR Surgical Date: 09/28/12 Next MD Visit: Aluisio May Prior Therapy: SNF  Authorization: [employer]     Authorization Visit#: 12 of 13   Past Medical History:  Past Medical History  Diagnosis Date  . Hypertension   . Diabetes mellitus   . Allergic rhinitis   . CAD (coronary artery disease)   . CAD (coronary artery disease), NEW 08/07/11 cutting balloon PTCA to 95% ostial diag. and 90%  proximal LCX.  prior cutting balloon and to RCA  & ostium of ostial diag.  08/07/2011  . HTN (hypertension) 08/07/2011  . High cholesterol   . Angina   . Heart attack ~ 2009    "mild; did not affect the heart muscle"  . Shortness of breath on exertion   . Arthritis   . 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 Melanoma of skin ~ 2005    "level 2; on my back"  . Bladder cancer 11/2005  . DOE (dyspnea on exertion) 08/07/2011  . Exertional angina 08/07/2011  . Hyperlipemia, intolerant to statins. 08/07/2011  . Arthritis of knee, left, needs total Knee in future with Dr. Lequita Halt  08/07/2011  . Portacath in place 08/11/2012  . Heart murmur    Past Surgical History:  Past Surgical History  Procedure Laterality Date  . Total knee arthroplasty  11/2003    right  . Coronary angioplasty with stent placement  ~ 2009    "!"  . Coronary angioplasty with stent placement  08/07/11    "1"  . Inguinal hernia repair  ~ 1970    left  . Tonsillectomy      "as a child"  . Cataract extraction w/ intraocular lens implant  ~ 2010    right  . Partial knee arthroplasty  06/2003    right  . Knee arthroscopy  02/20/11    left  . Cold cup removal bladder lesion  11/2005     "malignant"  . Total knee arthroplasty Left 09/28/2012    Procedure: TOTAL KNEE ARTHROPLASTY;  Surgeon: Loanne Drilling, MD;  Location: [facility];  Service: Orthopedics;  Laterality: Left;    Subjective Symptoms/Limitations Symptoms: Arthur Lutz states that his knee is doing well.  He is only having trouble straightening his knee.  How long can you sit comfortably?: Able to sit for an hour to go out to dinner.  He is limitied by his restless leg syndrome. How long can you stand comfortably?: able to stand for 20 minutes was 15. How long can you walk comfortably?: walking without a cane now for 15 minutes.  Pt was using a cane.  Pain Assessment Currently in Pain?: Yes Pain Score:   1 Pain Location: Knee Pain Orientation: Left Pain Type: Surgical pain   Sensation/Coordination/Flexibility/Functional Tests Functional Tests Functional Tests: LEFS   Assessment LLE AROM (degrees) Left Knee Extension: 5 (was 12 at eval 6 on 4/9) Left Knee Flexion: 120 (was 98 at eval and 107 on 4/9) LLE PROM (degrees) Left Knee Extension: 2 (was 9 at eval and 4 on 4/9) Left Knee Flexion: 120 (was  100 at eval and 112 on 4/9 did not try to go beyond 120 ) LLE Strength Left Hip Flexion: 5/5 Left Hip Extension: 4/5 (R LE is 4/5 as well) Left Hip ABduction: 5/5 Left Hip ADduction: 5/5 Left Knee Flexion: 5/5 Left Knee Extension: 5/5  Exercise/Treatments   Stretches Passive Hamstring Stretch: 1 rep;60 seconds Aerobic Stationary Bike: 8' @ 10   Supine Quad Sets: 10 reps Knee Extension: PROM Knee Flexion: PROM   Prone  Prone Knee Hang: 4 minutes;Limitations Prone Knee Hang Weights (lbs): 10' with manual    Modalities Modalities: Cryotherapy Manual Therapy Manual Therapy: Myofascial release Myofascial Release: to lateral aspect of hamstring to decrease fascial restrictions. Cryotherapy Number Minutes Cryotherapy: 10 Minutes Cryotherapy Location: Knee Type of Cryotherapy: Ice pack  Physical  Therapy Assessment and Plan PT Assessment and Plan Clinical Impression Statement: Sessionfocused on extension; pt extension is the best it has been.  PT has one more appointment then anticipate D/C to HEP. PT Plan: one more visit; LEFS next treatment.     Goals Home Exercise Program Pt will Perform Home Exercise Program: Independently PT Goal: Perform Home Exercise Program - Progress: Met PT Short Term Goals PT Short Term Goal 1: ROM 5 to 110 to allow normalized gait  PT Short Term Goal 1 - Progress: Met PT Short Term Goal 2: able to sit with comfort for 60 minutes to watch a show PT Short Term Goal 2 - Progress: Met PT Short Term Goal 3: able to walk for 20 minutes without increased pain PT Short Term Goal 3 - Progress: Met PT Short Term Goal 4: Pain level no higher than a 3/10 PT Short Term Goal 4 - Progress: Met PT Long Term Goals Time to Complete Long Term Goals: 4 weeks PT Long Term Goal 1: I in advance HEP PT Long Term Goal 2: Pt able to sit for two hours to allow pt to see a movie or travel PT Long Term Goal 2 - Progress: Progressing toward goal (unable due to restless leg syndrome) Long Term Goal 3: able to walk for 60 minutes without difficulty to be able to go shopping and walk for a healthy lifestyle  Long Term Goal 3 Progress: Progressing toward goal (Pt has not tried yet.) Long Term Goal 4: ROM to 120 to allow pt to step into trucks Long Term Goal 4 Progress: Met PT Long Term Goal 5: strength wnl to allow pt to step into tractors. Long Term Goal 5 Progress: Met PT Long Term Goal 6: Pain level to higher than a 1/10 80% of the day-progressing-progressing  Problem List Patient Active Problem List   Diagnosis Date Noted  . Stiffness of joint, not elsewhere classified, lower leg 10/26/2012  . Weakness of left leg 10/26/2012  . Difficulty in walking 10/26/2012  . Postoperative anemia due to acute blood loss 09/29/2012  . OA (osteoarthritis) of knee 09/28/2012  .  Portacath in place 08/11/2012  . Unstable angina pectoris - increaseing frequency and intensity after initially angina free post PCI 03/06/2012  . CAD (coronary artery disease), NEW 08/07/11 cutting balloon PTCA to 95% ostial diag. and 90%  proximal LCX.  prior cutting balloon to RCA  & ostium of ostial diag., 2009  08/07/2011  . HTN (hypertension) 08/07/2011  . DOE (dyspnea on exertion) 08/07/2011  . Exertional angina 08/07/2011  . Diabetes mellitus 08/07/2011  . Hyperlipemia, intolerant to statins. 08/07/2011  . Arthritis of knee, left, needs total Knee in future with Dr. Lequita Halt  08/07/2011  . Dyspnea 05/02/2011    PT - End of Session Activity Tolerance: Patient tolerated treatment well General Behavior During Therapy: WFL for tasks assessed/performed Cognition: WFL for tasks performed  GP    RUSSELL,CINDY 11/23/2012, 12:02 PM  Physician Documentation Your signature is required to indicate approval of the treatment plan as stated above.  Please sign and either send electronically or make a copy of this report for your files and return this physician signed original.   Please mark one 1.__approve of plan  2. ___approve of plan with the following conditions.   ______________________________                                                          _____________________ Physician Signature                                                                                                             Date

## 2012-11-25 ENCOUNTER — Ambulatory Visit (HOSPITAL_COMMUNITY): Payer: Medicare Other | Admitting: Physical Therapy

## 2012-11-27 ENCOUNTER — Ambulatory Visit (HOSPITAL_COMMUNITY)
Admission: RE | Admit: 2012-11-27 | Discharge: 2012-11-27 | Disposition: A | Discharge status: Home / Self Care | Payer: Medicare Other | Source: Ambulatory Visit | Attending: Internal Medicine | Admitting: Internal Medicine

## 2012-11-27 DIAGNOSIS — M25559 Pain in unspecified hip: Secondary | ICD-10-CM | POA: Insufficient documentation

## 2012-11-27 DIAGNOSIS — E119 Type 2 diabetes mellitus without complications: Secondary | ICD-10-CM | POA: Insufficient documentation

## 2012-11-27 DIAGNOSIS — M25669 Stiffness of unspecified knee, not elsewhere classified: Secondary | ICD-10-CM

## 2012-11-27 DIAGNOSIS — IMO0001 Reserved for inherently not codable concepts without codable children: Secondary | ICD-10-CM | POA: Insufficient documentation

## 2012-11-27 DIAGNOSIS — R29898 Other symptoms and signs involving the musculoskeletal system: Secondary | ICD-10-CM

## 2012-11-27 DIAGNOSIS — M25569 Pain in unspecified knee: Secondary | ICD-10-CM | POA: Insufficient documentation

## 2012-11-27 DIAGNOSIS — I10 Essential (primary) hypertension: Secondary | ICD-10-CM | POA: Insufficient documentation

## 2012-11-27 DIAGNOSIS — M6281 Muscle weakness (generalized): Secondary | ICD-10-CM | POA: Insufficient documentation

## 2012-11-27 DIAGNOSIS — R262 Difficulty in walking, not elsewhere classified: Secondary | ICD-10-CM | POA: Insufficient documentation

## 2012-11-27 NOTE — Progress Notes (Signed)
Physical Therapy Treatment Patient Details  Name: SUHEYB RAUCCI MRN: [id number] Date of Birth: 1940-08-28  Today's Date: 11/27/2012 Time: 0800-0855 PT Time Calculation (min): 55 min  Visit#: 13 of 13  Re-eval:   Assessment Diagnosis: L TKR Surgical Date: 09/28/12 Next MD Visit: Aluisio May Prior Therapy: SNF Charge:  There ex 32, Manual 8, IP 10 Authorization: [employer]   Authorization Visit#: 13 of 13   Subjective: Symptoms/Limitations Symptoms: Pt has no difficulty Pain Assessment Pain Score:   1 Pain Location: Knee Pain Orientation: Left Pain Type: Surgical pain Pain Onset: 1 to 4 weeks ago     Exercise/Treatments       Stretches Passive Hamstring Stretch: 2 reps;60 seconds Aerobic Stationary Bike: 8' @ 10   Standing Terminal Knee Extension: 15 reps;Theraband Other Standing Knee Exercises: retro walking x 2 RT    Supine Quad Sets: 10 reps Terminal Knee Extension: 10 reps Knee Extension: PROM    Prone  Prone Knee Hang: 4 minutes;Limitations Prone Knee Hang Weights (lbs): 10' with manual  Other Prone Exercises: TKE x 15   Modalities Modalities: Cryotherapy Manual Therapy Manual Therapy: Myofascial release Joint Mobilization: to release adhesions of lateral hamstring mm. Cryotherapy Number Minutes Cryotherapy: 10 Minutes Cryotherapy Location: Knee Type of Cryotherapy: Ice pack  Physical Therapy Assessment and Plan PT Assessment and Plan Clinical Impression Statement: Pt treatment focused on extension.  Pt is able to do all activities.  All STG met; 4/6 LTG met PT Plan: discharge pt.  Reevaluation completed last session .  Goals Home Exercise Program Pt will Perform Home Exercise Program: Independently PT Goal: Perform Home Exercise Program - Progress: Met PT Short Term Goals Time to Complete Short Term Goals: 2 weeks PT Short Term Goal 1: ROM 5 to 110 to allow normalized gait  PT Short Term Goal 1 - Progress: Met PT Short Term Goal  2: able to sit with comfort for 60 minutes to watch a show PT Short Term Goal 2 - Progress: Met PT Short Term Goal 3: able to walk for 20 minutes without increased pain PT Short Term Goal 3 - Progress: Met PT Short Term Goal 4: Pain level no higher than a 3/10 PT Short Term Goal 4 - Progress: Met PT Long Term Goals Time to Complete Long Term Goals: 4 weeks PT Long Term Goal 1: I in advance HEP PT Long Term Goal 1 - Progress: Met PT Long Term Goal 2: Pt able to sit for two hours to allow pt to see a movie or travel PT Long Term Goal 2 - Progress: Progressing toward goal Long Term Goal 3: able to walk for 60 minutes without difficulty to be able to go shopping and walk for a healthy lifestyle  Long Term Goal 3 Progress: Progressing toward goal Long Term Goal 4: ROM to 120 to allow pt to step into trucks Long Term Goal 4 Progress: Met PT Long Term Goal 5: strength wnl to allow pt to step into tractors. Long Term Goal 5 Progress: Met Additional PT Long Term Goals?: Yes PT Long Term Goal 6: Pain level to higher than a 1/10 80% of the day-progressing-progressing Long Term Goal 6 Progress: Met  Problem List Patient Active Problem List   Diagnosis Date Noted  . Stiffness of joint, not elsewhere classified, lower leg 10/26/2012  . Weakness of left leg 10/26/2012  . Difficulty in walking 10/26/2012  . Postoperative anemia due to acute blood loss 09/29/2012  . OA (osteoarthritis) of knee 09/28/2012  .  Portacath in place 08/11/2012  . Unstable angina pectoris - increaseing frequency and intensity after initially angina free post PCI 03/06/2012  . CAD (coronary artery disease), NEW 08/07/11 cutting balloon PTCA to 95% ostial diag. and 90%  proximal LCX.  prior cutting balloon to RCA  & ostium of ostial diag., 2009  08/07/2011  . HTN (hypertension) 08/07/2011  . DOE (dyspnea on exertion) 08/07/2011  . Exertional angina 08/07/2011  . Diabetes mellitus 08/07/2011  . Hyperlipemia, intolerant to  statins. 08/07/2011  . Arthritis of knee, left, needs total Knee in future with Dr. Lequita Halt  08/07/2011  . Dyspnea 05/02/2011    PT - End of Session Activity Tolerance: Patient tolerated treatment well General Behavior During Therapy: WFL for tasks assessed/performed Cognition: WFL for tasks performed  GP Functional Assessment Tool Used: LEFS  Functional Limitation: Mobility: Walking and moving around Mobility: Walking and Moving Around Goal Status [phone]): At least 1 percent but less than 20 percent impaired, limited or restricted Mobility: Walking and Moving Around Discharge Status [phone]): At least 20 percent but less than 40 percent impaired, limited or restricted (due to R knee needing to be replaced)  RUSSELL,CINDY 11/27/2012, 9:32 AM

## 2012-11-28 ENCOUNTER — Encounter: Payer: Self-pay | Admitting: Cardiology

## 2012-11-30 ENCOUNTER — Ambulatory Visit (HOSPITAL_COMMUNITY): Payer: Medicare Other | Admitting: *Deleted

## 2012-12-02 ENCOUNTER — Ambulatory Visit (HOSPITAL_COMMUNITY): Payer: Medicare Other | Admitting: Physical Therapy

## 2012-12-04 ENCOUNTER — Ambulatory Visit (HOSPITAL_COMMUNITY): Payer: Medicare Other | Admitting: Physical Therapy

## 2012-12-07 ENCOUNTER — Ambulatory Visit (HOSPITAL_COMMUNITY): Payer: Medicare Other | Admitting: *Deleted

## 2012-12-09 ENCOUNTER — Ambulatory Visit (HOSPITAL_COMMUNITY): Payer: Medicare Other | Admitting: Physical Therapy

## 2012-12-11 ENCOUNTER — Ambulatory Visit (HOSPITAL_COMMUNITY): Payer: Medicare Other | Admitting: Physical Therapy

## 2012-12-14 ENCOUNTER — Encounter (HOSPITAL_COMMUNITY): Payer: Medicare Other

## 2012-12-14 ENCOUNTER — Ambulatory Visit (HOSPITAL_COMMUNITY): Payer: Medicare Other

## 2013-01-19 ENCOUNTER — Ambulatory Visit (INDEPENDENT_AMBULATORY_CARE_PROVIDER_SITE_OTHER): Payer: Medicare Other | Admitting: Cardiology

## 2013-01-19 ENCOUNTER — Encounter: Payer: Self-pay | Admitting: Cardiology

## 2013-01-19 VITALS — BP 142/78 | HR 60 | Ht 72.0 in | Wt 255.5 lb

## 2013-01-19 DIAGNOSIS — M1710 Unilateral primary osteoarthritis, unspecified knee: Secondary | ICD-10-CM

## 2013-01-19 DIAGNOSIS — E785 Hyperlipidemia, unspecified: Secondary | ICD-10-CM

## 2013-01-19 DIAGNOSIS — E669 Obesity, unspecified: Secondary | ICD-10-CM

## 2013-01-19 DIAGNOSIS — M1712 Unilateral primary osteoarthritis, left knee: Secondary | ICD-10-CM

## 2013-01-19 DIAGNOSIS — I10 Essential (primary) hypertension: Secondary | ICD-10-CM

## 2013-01-19 DIAGNOSIS — E118 Type 2 diabetes mellitus with unspecified complications: Secondary | ICD-10-CM

## 2013-01-19 DIAGNOSIS — E66811 Obesity, class 1: Secondary | ICD-10-CM

## 2013-01-19 DIAGNOSIS — I2510 Atherosclerotic heart disease of native coronary artery without angina pectoris: Secondary | ICD-10-CM

## 2013-01-19 MED ORDER — LOSARTAN POTASSIUM-HCTZ 100-25 MG PO TABS: 1.0000 | ORAL_TABLET | Freq: Every day | ORAL | Status: DC

## 2013-01-19 NOTE — Patient Instructions (Addendum)
Your physician wants you to follow-up in 6 weeks with extender and 6 months with Dr Herbie Baltimore. You will receive a reminder letter in the mail two months in advance. If you don't receive a letter, please call our office to schedule the follow-up appointment.  Your physician has recommended you make the following change in your medication stop Plavix after this bottle. Start Aspirin 81 mg take 2 tablets daily. Increase LOSARTAN HCT 100-25  ONCE A DAY

## 2013-01-24 ENCOUNTER — Encounter: Payer: Self-pay | Admitting: Cardiology

## 2013-01-24 NOTE — Progress Notes (Signed)
Patient ID: Arthur Lutz, male   DOB: [birth year], 72 y.o.   MRN: [id number]  Clinic Note: HPI: Arthur Lutz is a 72 y.o. male with a PMH below who presents today for routine follow-up.  He was a former patient of Dr. Julieanne Lutz, who's CAD history dates back to 2009 had a "mild heart attack".  He underwent he said he RCA balloon PTCA to the ostium of the first diagonal branch. In January 2013 he then had PCI of the circumflex and Cutting Balloon PTCA of the a diagonal branch. These sites were all patent in August 2013 hematocrit catheterization. SMA-7 negative Myoview in February 2014 as part of preoperative risk assessment.  Interval History: Today comes in here do relatively well. He really isn't much the way of any significant problems. He says he has had some episodes at home where his blood pressures in the 190s over 90s. He felt lightheaded dizzy with a headache and some mild blurred vision this will happen once. Otherwise, he denies any TIA or projections. No other episodes of headaches or blurred vision. No chest pressure breath at rest exertion. 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 On that today he had working specifically very hard. But not much harder than usual. He denies any PND, orthopnea or edema. No lightheadedness, or syncope/near-syncope the symptoms. Only the mild episode of dizziness was a high blood pressure. No melena, hematochezia or hematuria. No claudication symptoms. No palpitations. He has a little swelling related more to his knee surgery as left knee and in March and his right knee is pending for his redo. He is pretty much been out of shape as a result of this. His problem her weight and having hard time to get it off.  He says he has been tried take his Crestor from more than once a week. He's been taking 5 mg a second they have been okay with that but says when he does 10 mg on in the day to really bother him too much. He has  started taking coenzyme Q10 which has been helping. He said willing to try to take 5 mg on 2 different days as well as 10 mg on the other day to allow 3 doses a week..  Past Medical History  Diagnosis Date  . History of heart attack 11/2007    "mild; did not affect the heart muscle"; PCI to RCA, Cutting PTCA Diag ostium  . CAD (coronary artery disease), native coronary artery 08/07/2011    NEW 08/07/11 cutting balloon PTCA to 95% ostial diag. and 90%  proximal LCX.  prior cutting balloon and to RCA  & ostium of ostial diag; Last Cath 02/2012 - Patent Diag PTCA, RCA & Circ BMS stents. Normal EF & EDP; Myoview 2/14 no evidence of ischemia or infarction  . Dyslipidemia   . Hypertension   . Diabetes mellitus type 2, controlled, with complications   . 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 Melanoma of skin ~ 2005    "level 2; on my back"  . Bladder cancer 11/2005  . Statin intolerance 08/07/2011  . Arthritis of knee, left, needs total Knee in future with Dr. Lequita Lutz  08/07/2011  . Portacath in place 08/11/2012  . Heart murmur     Aortic sclerosis  . Osteoarthritis     Status post right knee arthroplasty, positive for staph infection.  Pending right arthroplasty  . Allergic rhinitis    Prior Cardiac Evaluation and Past Surgical History: Past Surgical History  Procedure Laterality Date  . Total knee arthroplasty  11/2003    right  . Coronary angioplasty with stent placement  11/2007    Mid RCA - Driver BMS 3.5 mm x 15 mm; 2.0 mm Cutting Balloon PTCA - ostial D1  . Coronary angioplasty with stent placement  08/07/11    Abnormal TM Myoview - for exertional throat discomfort: Patent RCA stent, 90% circumflex -- Vision BMS 3.5 mm x 12 mm --> 4.2 mm. Ostial D2 95% -- 2.25 mm Cutting Balloon PTCA  . Inguinal hernia repair  ~ 1970    left  . Tonsillectomy      "as a child"  . Cataract extraction w/ intraocular lens implant  ~ 2010    right  . Partial  knee arthroplasty  06/2003    right  . Knee arthroscopy  02/20/11    left  . Cold cup removal bladder lesion  11/2005    "malignant"  . Total knee arthroplasty Left 09/28/2012    Procedure: TOTAL KNEE ARTHROPLASTY;  Surgeon: Arthur Drilling, MD;  Location: [facility];  Service: Orthopedics;  Laterality: Left;  . Cardiac catheterization  August '13    Widely patent RCA and LCx stents. Also patent PTCA site to D1, ~40-50% D2. Normal EF, normal EDP  . Doppler echocardiography  May 2009    Normal EF, impaired relaxation, with sclerotic aortic valve. No stenosis.  Marland Kitchen Nm myoview ltd  February 2014    Low risk, EF 49%, improved from prior. Small apical and apical septal defect, fixed likely artifact no skin or infarction.    Allergies  Allergen Reactions  . Statins Other (See Comments)    myalgias     Current Outpatient Prescriptions  Medication Sig Dispense Refill  . acetaminophen (TYLENOL) 325 MG tablet Take 2 tablets (650 mg total) by mouth every 6 (six) hours as needed.  60 tablet  0  . amLODipine (NORVASC) 5 MG tablet Take 1 tablet by mouth daily after breakfast.       . aspirin EC 81 MG tablet Take 81 mg by mouth daily.      . cholecalciferol (VITAMIN D) 1000 UNITS tablet Take 1,000 Units by mouth daily.      . clopidogrel (PLAVIX) 75 MG tablet Take 75 mg by mouth daily.      . Coenzyme Q10 (COQ10) 400 MG CAPS Take by mouth.      . diphenhydrAMINE (BENADRYL) 25 mg capsule Take 25 mg by mouth at bedtime as needed for sleep.      . isosorbide mononitrate (IMDUR) 60 MG 24 hr tablet Take 60 mg by mouth daily.      . metFORMIN (GLUCOPHAGE) 500 MG tablet Take 500 mg by mouth 2 (two) times daily with a meal.      . metoprolol succinate (TOPROL-XL) 25 MG 24 hr tablet Take 12.5 mg by mouth daily after breakfast.      . rosuvastatin (CRESTOR) 10 MG tablet 10 mg. take1 to 2 tablets a week      . losartan-hydrochlorothiazide (HYZAAR) 100-25 MG per tablet Take 1 tablet by mouth daily.  90 tablet  3  .  triazolam (HALCION) 0.25 MG tablet Take 0.25 mg by mouth at bedtime as needed (sleep).       No current facility-administered medications for this visit.  Was taking Losartan-HCTZ 50/12.5 -- > increased to 2  x dose today.  History   Social History  . Marital Status: Married    Spouse Name: N/A    Number of Children: N/A  . Years of Education: N/A   Occupational History  . Not on file.   Social History Main Topics  . Smoking status: Former Smoker -- 2.00 packs/day for 25 years    Types: Cigarettes    Quit date: 07/29/1980  . Smokeless tobacco: Never Used  . Alcohol Use: 1.1 oz/week    1 Glasses of wine, 1 Drinks containing 0.5 oz of alcohol per week     Comment: 08/07/11 "wine or whiskey"  . Drug Use: No  . Sexually Active: No   Other Topics Concern  . Not on file   Social History Narrative   He is a married father of 2, grandfather of 7. He is not really getting a lot of exercise now. He previously had been working out on a treadmill at least 2 times a day for 15 minutes at a time, but he   is not able to do that as much now because his knee hurts him too bad. Does not smoke and only occasional alcoholic beverage.    ROS: A comprehensive Review of Systems - Negative except Pertinent positives noted above, and the need his comfort postoperative changes in left knee infected right ischial but still bad. Is a swelling there. Otherwise the sizes limited activity level being postoperative he's doing relatively well.  PHYSICAL EXAM BP 142/78  Pulse 60  Ht 6' (1.829 m)  Wt 255 lb 8 oz (115.894 kg)  BMI 34.64 kg/m2 General, he is a pleasant, healthy-appearing gentleman who is mildly to moderately obese with a BMI of 34; No acute distress, alert and oriented x3, answers questions appropriately.  HEENT: NCAT. EOMI. MMM. Anicteric sclerae.  Neck: Supple. No LAN, JVD, or carotid bruit.  Heart: RRR. Normal S1, S2. He has a 1/6 crescendo-decrescendo SEM heard at the right upper sternal  border consistent with aortic sclerosis. No R/G.  Lungs: CTAB. Nonlabored. Normal respiratory effort. Good air movement. No wheezes, rales, rhonchi.  Abdomen: Obese, but otherwise soft, NT, ND. NABS. No HSM.  Extremities: Maybe trace lower extremity edema, but 2+ equal pulses throughout. He has an antalgic gait favoring his left knee  XBM:[id number] today: Yes Rate:60 , Rhythm: Normal Sinus; Non-specific ST-T changes  Recent Labs:  ASSESSMENT: Fairly stable cardiac standpoint. Blood pressures elevated and he did have one episode where is quite high. Will need adjustments medications.  CAD (coronary artery disease) - Plan: EKG 12-Lead  HTN (hypertension)  Hyperlipemia, intolerant to statins.  Obesity (BMI 30.0-34.9)  Diabetes mellitus type 2, controlled, with complications  PLAN: Per problem list. Orders Placed This Encounter  Procedures  . EKG 12-Lead    Followup: 6 months with APP, 12 months with Dr. Herbie Baltimore.  Marykay Lex, M.D., M.S. [facility] [address] [place], [place]  [address]  [phone] Pager # [phone] 01/19/2013 10:49 PM

## 2013-01-30 ENCOUNTER — Encounter: Payer: Self-pay | Admitting: Cardiology

## 2013-01-30 DIAGNOSIS — E669 Obesity, unspecified: Secondary | ICD-10-CM | POA: Insufficient documentation

## 2013-01-30 NOTE — Assessment & Plan Note (Signed)
If he continues to be as stable as he is, dull but he would need any further evaluation prior to his upcoming surgery on the other knee.

## 2013-01-30 NOTE — Assessment & Plan Note (Addendum)
Seems to be tolerating Crestor 1-2 x weekly.  Currently taking 10mg  one day & 5 mg for hte second dose.  Will try to increase to 10/5/5 to get a dose 3 x weekly.  Labs are being followed by his PCP.  He does note that CoQ10 is helping.  If we are unable to get to goal with this regimen, I think we need to add Zetia.

## 2013-01-30 NOTE — Assessment & Plan Note (Addendum)
Doing well with recent NST that was negative for ischemia. At this point, we can d/c Plavix & re-institute ASA.  He is concerned aboutt his bruising. He is on BB, Statin, ARB & CCB along with Imdur.

## 2013-01-30 NOTE — Assessment & Plan Note (Signed)
-   Not currently on medications

## 2013-01-30 NOTE — Assessment & Plan Note (Signed)
Would like to see a stable BP from 120-130 mmHg with DM & CAD.  Will increase ARB/HCTZ to 100/25 mg (2x dose).

## 2013-02-02 ENCOUNTER — Encounter (HOSPITAL_COMMUNITY): Payer: Medicare Other | Attending: Family Medicine

## 2013-02-02 DIAGNOSIS — Z9889 Other specified postprocedural states: Secondary | ICD-10-CM | POA: Insufficient documentation

## 2013-02-02 DIAGNOSIS — Z95828 Presence of other vascular implants and grafts: Secondary | ICD-10-CM

## 2013-02-02 MED ORDER — HEPARIN SOD (PORK) LOCK FLUSH 100 UNIT/ML IV SOLN
500.0000 [IU] | Freq: Once | INTRAVENOUS | Status: AC
  Administered 2013-02-02: 500 [IU] via INTRAVENOUS
  Filled 2013-02-02: qty 5

## 2013-02-02 MED ORDER — SODIUM CHLORIDE 0.9 % IJ SOLN
10.0000 mL | INTRAMUSCULAR | Status: DC | PRN
  Administered 2013-02-02: 10 mL via INTRAVENOUS
  Filled 2013-02-02: qty 10

## 2013-02-02 MED ORDER — HEPARIN SOD (PORK) LOCK FLUSH 100 UNIT/ML IV SOLN
INTRAVENOUS | Status: AC
  Filled 2013-02-02: qty 5

## 2013-02-02 NOTE — Progress Notes (Signed)
Arthur Lutz presented for Portacath access and flush. Proper placement of portacath confirmed by CXR. Portacath located left chest wall accessed with  H 20 needle. Good blood return present. Portacath flushed with 20ml NS and 500U/58ml Heparin and needle removed intact. Procedure without incident. Patient tolerated procedure well.

## 2013-03-25 ENCOUNTER — Other Ambulatory Visit: Payer: Self-pay | Admitting: Dermatology

## 2013-05-12 ENCOUNTER — Encounter (HOSPITAL_COMMUNITY)
Admission: RE | Admit: 2013-05-12 | Discharge: 2013-05-12 | Disposition: A | Discharge status: Home / Self Care | Payer: Medicare Other | Source: Ambulatory Visit | Attending: Family Medicine | Admitting: Family Medicine

## 2013-05-12 ENCOUNTER — Encounter (HOSPITAL_COMMUNITY): Payer: Self-pay

## 2013-05-12 DIAGNOSIS — Z452 Encounter for adjustment and management of vascular access device: Secondary | ICD-10-CM | POA: Insufficient documentation

## 2013-05-12 MED ORDER — SODIUM CHLORIDE 0.9 % IJ SOLN: 10.0000 mL | INTRAMUSCULAR | Status: DC | PRN

## 2013-05-12 MED ORDER — SODIUM CHLORIDE 0.9 % IJ SOLN
10.0000 mL | INTRAMUSCULAR | Status: AC | PRN
  Administered 2013-05-12: 10 mL

## 2013-05-12 MED ORDER — HEPARIN SOD (PORK) LOCK FLUSH 100 UNIT/ML IV SOLN
500.0000 [IU] | INTRAVENOUS | Status: DC | PRN
  Filled 2013-05-12: qty 5

## 2013-05-12 NOTE — Progress Notes (Signed)
Pt. Here for maintenance port flush.  Pt. States that last flush was in July.  Port accessed using sterile technique.  Flushed with 10cc NS with no resistance but no blood return was noted.  Port was then [employer] reaccessed by another nurse, flushed with another 10cc NS with no resistance but no blood return was noted again.  Pt. DC from Short Stay & tolerated well.  Pt. Scheduled to return in December for maintenance flush.

## 2013-06-25 ENCOUNTER — Other Ambulatory Visit (HOSPITAL_COMMUNITY): Payer: Self-pay | Admitting: Family Medicine

## 2013-06-25 ENCOUNTER — Ambulatory Visit (HOSPITAL_COMMUNITY)
Admission: RE | Admit: 2013-06-25 | Discharge: 2013-06-25 | Disposition: A | Discharge status: Home / Self Care | Payer: Medicare Other | Source: Ambulatory Visit | Attending: Family Medicine | Admitting: Family Medicine

## 2013-06-25 DIAGNOSIS — M51379 Other intervertebral disc degeneration, lumbosacral region without mention of lumbar back pain or lower extremity pain: Secondary | ICD-10-CM | POA: Insufficient documentation

## 2013-06-25 DIAGNOSIS — M47817 Spondylosis without myelopathy or radiculopathy, lumbosacral region: Secondary | ICD-10-CM | POA: Insufficient documentation

## 2013-06-25 DIAGNOSIS — M5137 Other intervertebral disc degeneration, lumbosacral region: Secondary | ICD-10-CM | POA: Insufficient documentation

## 2013-06-25 DIAGNOSIS — M549 Dorsalgia, unspecified: Secondary | ICD-10-CM

## 2013-07-07 ENCOUNTER — Encounter (HOSPITAL_COMMUNITY)
Admission: RE | Admit: 2013-07-07 | Discharge: 2013-07-07 | Disposition: A | Discharge status: Home / Self Care | Payer: Medicare Other | Source: Ambulatory Visit | Attending: Family Medicine | Admitting: Family Medicine

## 2013-07-07 ENCOUNTER — Other Ambulatory Visit: Payer: Self-pay

## 2013-07-07 DIAGNOSIS — Z452 Encounter for adjustment and management of vascular access device: Secondary | ICD-10-CM | POA: Insufficient documentation

## 2013-07-07 MED ORDER — HEPARIN SOD (PORK) LOCK FLUSH 100 UNIT/ML IV SOLN
500.0000 [IU] | INTRAVENOUS | Status: AC | PRN
  Administered 2013-07-07: 500 [IU]

## 2013-07-07 MED ORDER — METOPROLOL SUCCINATE ER 25 MG PO TB24: 12.5000 mg | ORAL_TABLET | Freq: Every day | ORAL | Status: DC

## 2013-07-07 MED ORDER — SODIUM CHLORIDE 0.9 % IJ SOLN
10.0000 mL | INTRAMUSCULAR | Status: AC | PRN
  Administered 2013-07-07: 10 mL

## 2013-07-08 ENCOUNTER — Other Ambulatory Visit: Payer: Self-pay | Admitting: *Deleted

## 2013-07-08 MED ORDER — METOPROLOL SUCCINATE ER 25 MG PO TB24: 12.5000 mg | ORAL_TABLET | Freq: Every day | ORAL | Status: DC

## 2013-08-05 ENCOUNTER — Encounter (INDEPENDENT_AMBULATORY_CARE_PROVIDER_SITE_OTHER): Payer: Self-pay | Admitting: *Deleted

## 2013-08-16 ENCOUNTER — Encounter (INDEPENDENT_AMBULATORY_CARE_PROVIDER_SITE_OTHER): Payer: Self-pay | Admitting: Internal Medicine

## 2013-08-16 ENCOUNTER — Ambulatory Visit (INDEPENDENT_AMBULATORY_CARE_PROVIDER_SITE_OTHER): Payer: Medicare Other | Admitting: Internal Medicine

## 2013-08-16 VITALS — BP 142/84 | HR 72 | Temp 98.1°F | Ht 72.0 in | Wt 260.1 lb

## 2013-08-16 DIAGNOSIS — R143 Flatulence: Secondary | ICD-10-CM

## 2013-08-16 DIAGNOSIS — R141 Gas pain: Secondary | ICD-10-CM

## 2013-08-16 DIAGNOSIS — K6389 Other specified diseases of intestine: Secondary | ICD-10-CM | POA: Insufficient documentation

## 2013-08-16 DIAGNOSIS — R140 Abdominal distension (gaseous): Secondary | ICD-10-CM | POA: Insufficient documentation

## 2013-08-16 DIAGNOSIS — R142 Eructation: Secondary | ICD-10-CM

## 2013-08-16 MED ORDER — CIPROFLOXACIN HCL 500 MG PO TABS: 500.0000 mg | ORAL_TABLET | Freq: Two times a day (BID) | ORAL | Status: DC

## 2013-08-16 NOTE — Patient Instructions (Addendum)
PR in 1 month.  Cipro eprescribed to his pharmacy. HIDA scan.

## 2013-08-16 NOTE — Progress Notes (Signed)
Subjective:     Patient ID: Arthur Lutz, male   DOB: 11-Aug-1940, 73 y.o.   MRN: [id number]  HPI Here today with c/o bloating. He tells me he has bloating. No matter what he eats he bloats. Underwent a colonoscopy in 2011 for same. BMs x 1 a day. Sometimes he is constipated. Sometimes he has sharp pain at his rectum with his BM. No melena or bright red rectal bleeding. Rarely has acid reflux. He tells me is abdomen is swollen today.  Appetite is good. No weight loss.  Patient tells me in 2006 he underwent a HIDA scan for abdominal pain. 1. Patent cystic duct without evidence for acute cholecystitis.  2. Gallbladder ejection fraction equals 5.5 percent consistent with biliary dyskinesis.  He was referred to Dr. Romona Curls but cancelled the appt.  10/09/2009 Colonoscopy: (recurrent bloating for several years). Heme positive stools.  A 64mm polyp ablated via cold biopsy from hepatic flexure. Two small diverticula at sigmoid colon. External hemorrhoids. Biopsy: Tubular adenoma.  Review of Systems See hpi Current Outpatient Prescriptions  Medication Sig Dispense Refill  . acetaminophen (TYLENOL) 325 MG tablet Take 2 tablets (650 mg total) by mouth every 6 (six) hours as needed.  60 tablet  0  . amLODipine (NORVASC) 5 MG tablet Take 1 tablet by mouth daily after breakfast.       . aspirin EC 81 MG tablet Take 81 mg by mouth daily.      . cholecalciferol (VITAMIN D) 1000 UNITS tablet Take 1,000 Units by mouth daily.      . clopidogrel (PLAVIX) 75 MG tablet Take 75 mg by mouth daily.      . Coenzyme Q10 (COQ10) 400 MG CAPS Take by mouth.      . diphenhydrAMINE (BENADRYL) 25 mg capsule Take 25 mg by mouth at bedtime as needed for sleep.      . isosorbide mononitrate (IMDUR) 60 MG 24 hr tablet Take 60 mg by mouth daily.      Marland Kitchen losartan-hydrochlorothiazide (HYZAAR) 100-25 MG per tablet Take 1 tablet by mouth daily.  90 tablet  3  . metFORMIN (GLUCOPHAGE) 500 MG tablet Take 500 mg by mouth 2 (two)  times daily with a meal.      . metoprolol succinate (TOPROL-XL) 25 MG 24 hr tablet Take 0.5 tablets (12.5 mg total) by mouth daily after breakfast.  45 tablet  0  . rosuvastatin (CRESTOR) 10 MG tablet 10 mg. take1 to 2 tablets a week      . rosuvastatin (CRESTOR) 10 MG tablet Take 10 mg by mouth daily.       No current facility-administered medications for this visit.   Past Medical History  Diagnosis Date  . History of heart attack 11/2007    "mild; did not affect the heart muscle"; PCI to RCA, Cutting PTCA Diag ostium  . CAD (coronary artery disease), native coronary artery 08/07/2011    NEW 08/07/11 cutting balloon PTCA to 95% ostial diag. and 90%  proximal LCX.  prior cutting balloon and to RCA  & ostium of ostial diag; Last Cath 02/2012 - Patent Diag PTCA, RCA & Circ BMS stents. Normal EF & EDP; Myoview 2/14 no evidence of ischemia or infarction  . Dyslipidemia   . Hypertension   . Diabetes mellitus type 2, controlled, with complications   . 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 Melanoma of skin ~ 2005    "  level 2; on my back"  . Bladder cancer 11/2005  . Statin intolerance 08/07/2011  . Arthritis of knee, left, needs total Knee in future with Dr. Wynelle Link  08/07/2011  . Portacath in place 08/11/2012  . Heart murmur     Aortic sclerosis  . Osteoarthritis     Status post right knee arthroplasty, positive for staph infection. Pending right arthroplasty  . Allergic rhinitis    Past Surgical History  Procedure Laterality Date  . Total knee arthroplasty  11/2003    right. March of 2014. Total of 3.  . Coronary angioplasty with stent placement  11/2007    Mid RCA - Driver BMS 3.5 mm x 15 mm; 2.0 mm Cutting Balloon PTCA - ostial D1  . Coronary angioplasty with stent placement  08/07/11    Abnormal TM Myoview - for exertional throat discomfort: Patent RCA stent, 90% circumflex -- Vision BMS 3.5 mm x 12 mm --> 4.2 mm. Ostial D2 95% -- 2.25  mm Cutting Balloon PTCA  . Inguinal hernia repair  ~ 1970    left  . Tonsillectomy      "as a child"  . Cataract extraction w/ intraocular lens implant  ~ 2010    right  . Partial knee arthroplasty  06/2003    right  . Knee arthroscopy  02/20/11    left  . Cold cup removal bladder lesion  11/2005    "malignant"  . Total knee arthroplasty Left 09/28/2012    Procedure: TOTAL KNEE ARTHROPLASTY;  Surgeon: Gearlean Alf, MD;  Location: [facility];  Service: Orthopedics;  Laterality: Left;  . Cardiac catheterization  August '13    Widely patent RCA and LCx stents. Also patent PTCA site to D1, ~40-50% D2. Normal EF, normal EDP  . Doppler echocardiography  May 2009    Normal EF, impaired relaxation, with sclerotic aortic valve. No stenosis.  Marland Kitchen Nm myoview ltd  February 2014    Low risk, EF 49%, improved from prior. Small apical and apical septal defect, fixed likely artifact no skin or infarction.   Allergies  Allergen Reactions  . Statins Other (See Comments)    myalgias        Objective:   Physical Exam  Filed Vitals:   08/16/13 1536  BP: 142/84  Pulse: 72  Temp: 98.1 F (36.7 C)  Height: 6' (1.829 m)  Weight: 260 lb 1.6 oz (117.981 kg)   Alert and oriented. Skin warm and dry. Oral mucosa is moist.   . Sclera anicteric, conjunctivae is pink. Thyroid not enlarged. No cervical lymphadenopathy. Lungs clear. Heart regular rate and rhythm.  Abdomen is soft. Bowel sounds are positive. No hepatomegaly. No abdominal masses felt. No tenderness.  No edema to lower extremities.       Assessment:    Bloating. Possible intestinal bacterial overgrowth. I discussed this case with Dr. Laural Golden. Abnormal HIDA scan in 2006.    Plan:    Cipro 500mg  BID x 14 days. HIDA scan. PR in 3 weeks.

## 2013-08-20 ENCOUNTER — Encounter (HOSPITAL_COMMUNITY): Payer: Self-pay

## 2013-08-20 ENCOUNTER — Encounter (HOSPITAL_COMMUNITY)
Admission: RE | Admit: 2013-08-20 | Discharge: 2013-08-20 | Disposition: A | Discharge status: Home / Self Care | Payer: Medicare Other | Source: Ambulatory Visit | Attending: Family Medicine | Admitting: Family Medicine

## 2013-08-20 DIAGNOSIS — K6389 Other specified diseases of intestine: Secondary | ICD-10-CM

## 2013-08-20 DIAGNOSIS — R141 Gas pain: Secondary | ICD-10-CM | POA: Insufficient documentation

## 2013-08-20 DIAGNOSIS — R140 Abdominal distension (gaseous): Secondary | ICD-10-CM

## 2013-08-20 DIAGNOSIS — R142 Eructation: Secondary | ICD-10-CM | POA: Insufficient documentation

## 2013-08-20 DIAGNOSIS — R143 Flatulence: Secondary | ICD-10-CM

## 2013-08-20 HISTORY — DX: Type 2 diabetes mellitus without complications: E11.9

## 2013-08-20 MED ORDER — TECHNETIUM TC 99M MEBROFENIN IV KIT
5.0000 | PACK | Freq: Once | INTRAVENOUS | Status: AC | PRN
  Administered 2013-08-20: 5 via INTRAVENOUS

## 2013-09-14 ENCOUNTER — Ambulatory Visit (INDEPENDENT_AMBULATORY_CARE_PROVIDER_SITE_OTHER): Payer: Medicare Other | Admitting: Internal Medicine

## 2013-09-15 ENCOUNTER — Encounter (INDEPENDENT_AMBULATORY_CARE_PROVIDER_SITE_OTHER): Payer: Self-pay | Admitting: Internal Medicine

## 2013-09-15 ENCOUNTER — Ambulatory Visit (INDEPENDENT_AMBULATORY_CARE_PROVIDER_SITE_OTHER): Payer: Medicare Other | Admitting: Internal Medicine

## 2013-09-15 VITALS — BP 152/100 | HR 72 | Temp 97.8°F | Ht 72.0 in | Wt 264.4 lb

## 2013-09-15 DIAGNOSIS — R140 Abdominal distension (gaseous): Secondary | ICD-10-CM

## 2013-09-15 DIAGNOSIS — R141 Gas pain: Secondary | ICD-10-CM

## 2013-09-15 DIAGNOSIS — R948 Abnormal results of function studies of other organs and systems: Secondary | ICD-10-CM

## 2013-09-15 DIAGNOSIS — R142 Eructation: Secondary | ICD-10-CM

## 2013-09-15 DIAGNOSIS — R143 Flatulence: Secondary | ICD-10-CM

## 2013-09-15 MED ORDER — DEXLANSOPRAZOLE 60 MG PO CPDR: 60.0000 mg | DELAYED_RELEASE_CAPSULE | Freq: Every day | ORAL | Status: DC

## 2013-09-15 NOTE — Progress Notes (Signed)
Subjective:     Patient ID: Arthur Lutz, male   DOB: 1941-02-24, 73 y.o.   MRN: [id number]  HPI Here today for f/u. He was last seen last month. He c/o bloating sensations. No matter what he ate he would bloat.  Underwent a colonoscopy in 2011 for same. (see below). He also underwent a HIDA scan in 2006 for abdominal pain.' Showed EF 5.5%. He was referred to Dr. Romona Curls but cancelled the appt.  Recently underwent a HIDA scan this year which revealed EF 4.4%.  He tells me he has some bloating. He refuses to see a physician.    10/09/2009 Colonoscopy: (recurrent bloating for several years). Heme positive stools. A 68mm polyp ablated via cold biopsy from hepatic flexure. Two small diverticula at sigmoid colon. External hemorrhoids.  Biopsy: Tubular adenoma.    08/16/2012 HIDA scan:  FINDINGS:  The initial images demonstrate homogeneous hepatic activity and  prompt visualization of the gallbladder and biliary system.  Following CCK, there is limited gallbladder contraction. There is  progressive small bowel activity. The gallbladder ejection fraction  calculated at 30 min is 4.4%. At 30 min, normal ejection fraction is  greater than 30%.  The patient did not experience symptoms during CCK infusion.  IMPRESSION:  Stable examination with abnormally low gallbladder ejection fraction  suggesting biliary dyskinesia. The cystic and common bile ducts are .     04/2005: HIDA scan:  Patent cystic duct without evidence for acute cholecystitis.   Gallbladder ejection fraction equals 5.5 percent consistent with biliary dyskinesis    Review of Systems see hpi Past Medical History  Diagnosis Date  . History of heart attack 11/2007    "mild; did not affect the heart muscle"; PCI to RCA, Cutting PTCA Diag ostium  . CAD (coronary artery disease), native coronary artery 08/07/2011    NEW 08/07/11 cutting balloon PTCA to 95% ostial diag. and 90%  proximal LCX.  prior cutting balloon and to RCA  &  ostium of ostial diag; Last Cath 02/2012 - Patent Diag PTCA, RCA & Circ BMS stents. Normal EF & EDP; Myoview 2/14 no evidence of ischemia or infarction  . Dyslipidemia   . Hypertension   . Diabetes mellitus type 2, controlled, with complications   . 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 Melanoma of skin ~ 2005    "level 2; on my back"  . Bladder cancer 11/2005  . Statin intolerance 08/07/2011  . Arthritis of knee, left, needs total Knee in future with Dr. Wynelle Link  08/07/2011  . Portacath in place 08/11/2012  . Heart murmur     Aortic sclerosis  . Osteoarthritis     Status post right knee arthroplasty, positive for staph infection. Pending right arthroplasty  . Allergic rhinitis   . Diabetes mellitus without complication     Past Surgical History  Procedure Laterality Date  . Total knee arthroplasty  11/2003    right. March of 2014. Total of 3.  . Coronary angioplasty with stent placement  11/2007    Mid RCA - Driver BMS 3.5 mm x 15 mm; 2.0 mm Cutting Balloon PTCA - ostial D1  . Coronary angioplasty with stent placement  08/07/11    Abnormal TM Myoview - for exertional throat discomfort: Patent RCA stent, 90% circumflex -- Vision BMS 3.5 mm x 12 mm --> 4.2 mm. Ostial D2 95% -- 2.25 mm Cutting Balloon PTCA  . Inguinal hernia  repair  ~ 1970    left  . Tonsillectomy      "as a child"  . Cataract extraction w/ intraocular lens implant  ~ 2010    right  . Partial knee arthroplasty  06/2003    right  . Knee arthroscopy  02/20/11    left  . Cold cup removal bladder lesion  11/2005    "malignant"  . Total knee arthroplasty Left 09/28/2012    Procedure: TOTAL KNEE ARTHROPLASTY;  Surgeon: Gearlean Alf, MD;  Location: [facility];  Service: Orthopedics;  Laterality: Left;  . Cardiac catheterization  August '13    Widely patent RCA and LCx stents. Also patent PTCA site to D1, ~40-50% D2. Normal EF, normal EDP  . Doppler echocardiography  May  2009    Normal EF, impaired relaxation, with sclerotic aortic valve. No stenosis.  Marland Kitchen Nm myoview ltd  February 2014    Low risk, EF 49%, improved from prior. Small apical and apical septal defect, fixed likely artifact no skin or infarction.    Allergies  Allergen Reactions  . Statins Other (See Comments)    myalgias     Current Outpatient Prescriptions on File Prior to Visit  Medication Sig Dispense Refill  . acetaminophen (TYLENOL) 325 MG tablet Take 2 tablets (650 mg total) by mouth every 6 (six) hours as needed.  60 tablet  0  . amLODipine (NORVASC) 5 MG tablet Take 1 tablet by mouth daily after breakfast.       . aspirin EC 81 MG tablet Take 81 mg by mouth daily.      . cholecalciferol (VITAMIN D) 1000 UNITS tablet Take 1,000 Units by mouth daily.      . ciprofloxacin (CIPRO) 500 MG tablet Take 1 tablet (500 mg total) by mouth 2 (two) times daily.  28 tablet  0  . clopidogrel (PLAVIX) 75 MG tablet Take 75 mg by mouth daily.      . Coenzyme Q10 (COQ10) 400 MG CAPS Take by mouth.      . diphenhydrAMINE (BENADRYL) 25 mg capsule Take 25 mg by mouth at bedtime as needed for sleep.      . isosorbide mononitrate (IMDUR) 60 MG 24 hr tablet Take 60 mg by mouth daily.      Marland Kitchen losartan-hydrochlorothiazide (HYZAAR) 100-25 MG per tablet Take 1 tablet by mouth daily.  90 tablet  3  . metFORMIN (GLUCOPHAGE) 500 MG tablet Take 500 mg by mouth 2 (two) times daily with a meal.      . metoprolol succinate (TOPROL-XL) 25 MG 24 hr tablet Take 0.5 tablets (12.5 mg total) by mouth daily after breakfast.  45 tablet  0  . rosuvastatin (CRESTOR) 10 MG tablet 10 mg. take1 to 2 tablets a week      . rosuvastatin (CRESTOR) 10 MG tablet Take 10 mg by mouth daily.       No current facility-administered medications on file prior to visit.         Objective:   Physical Exam  Filed Vitals:   09/15/13 1344  BP: 152/100  Pulse: 72  Temp: 97.8 F (36.6 C)  Height: 6' (1.829 m)  Weight: 264 lb 6.4 oz  (119.931 kg)  Alert and oriented. Skin warm and dry. Oral mucosa is moist.   . Sclera anicteric, conjunctivae is pink. Thyroid not enlarged. No cervical lymphadenopathy. Lungs clear. Heart regular rate and rhythm.  Abdomen is soft. Bowel sounds are positive. No hepatomegaly. No abdominal masses felt. No  tenderness.  No edema to lower extremities. Patient is alert and oriented.      Assessment:    Bloating, Abnormal HIDA scan. Presently not taking a PPI.     Plan:   Dexilant 60mg  daily. Samples x 5 boxes. He will call when he is ready to see a Psychologist, sport and exercise. He would like to try the [place] first.

## 2013-09-15 NOTE — Patient Instructions (Addendum)
Rx for Samples He will let me know when he would like to see a Psychologist, sport and exercise. Samples of Dexilant given to patient.

## 2013-09-27 ENCOUNTER — Encounter (HOSPITAL_COMMUNITY): Admission: RE | Admit: 2013-09-27 | Payer: Medicare Other | Source: Ambulatory Visit

## 2013-10-11 ENCOUNTER — Telehealth (INDEPENDENT_AMBULATORY_CARE_PROVIDER_SITE_OTHER): Payer: Self-pay | Admitting: *Deleted

## 2013-10-11 NOTE — Telephone Encounter (Signed)
Anuel said he was given samples of Dexilant and a Rx was sent in. When he went to pick the Rx up, the cost was $80. He can not pay this amount. Would like to speak with Terri about getting samples or changing the medicine to something else. His return phone number is [phone].

## 2013-10-12 NOTE — Telephone Encounter (Signed)
One box of Dexilant and drug card left at front desk for him. If drug card does not work, will switch to another PPI. Does not want to address HIDA scan at this time.

## 2013-10-15 ENCOUNTER — Telehealth (INDEPENDENT_AMBULATORY_CARE_PROVIDER_SITE_OTHER): Payer: Self-pay | Admitting: *Deleted

## 2013-10-15 NOTE — Telephone Encounter (Signed)
Arthur Lutz has the 5 day sample given to him on 10/14/13 with discount card for [place]. He has lost the prescription and would like to see if Karna Christmas would send it in to his pharmacy. He uses [facility]. The return phone number is [phone].

## 2013-10-18 ENCOUNTER — Other Ambulatory Visit (INDEPENDENT_AMBULATORY_CARE_PROVIDER_SITE_OTHER): Payer: Self-pay | Admitting: Internal Medicine

## 2013-10-18 DIAGNOSIS — R140 Abdominal distension (gaseous): Secondary | ICD-10-CM

## 2013-10-18 MED ORDER — DEXLANSOPRAZOLE 60 MG PO CPDR: 60.0000 mg | DELAYED_RELEASE_CAPSULE | Freq: Every day | ORAL | Status: DC

## 2013-10-18 NOTE — Telephone Encounter (Signed)
Rx sent to [facility]

## 2013-10-27 ENCOUNTER — Telehealth (INDEPENDENT_AMBULATORY_CARE_PROVIDER_SITE_OTHER): Payer: Self-pay | Admitting: *Deleted

## 2013-10-27 DIAGNOSIS — R140 Abdominal distension (gaseous): Secondary | ICD-10-CM

## 2013-10-27 DIAGNOSIS — K219 Gastro-esophageal reflux disease without esophagitis: Secondary | ICD-10-CM

## 2013-10-27 MED ORDER — DEXLANSOPRAZOLE 60 MG PO CPDR: 60.0000 mg | DELAYED_RELEASE_CAPSULE | Freq: Every day | ORAL | Status: DC

## 2013-10-27 NOTE — Telephone Encounter (Signed)
Rx sent to [facility].

## 2013-10-27 NOTE — Telephone Encounter (Addendum)
His Dexilant is going to cost him $80 for 30 pills. He would like for a Rx sent to [facility] to see if her can get it cheaper through them. If Karna Christmas has any question please give him a call at [phone].

## 2013-10-28 ENCOUNTER — Other Ambulatory Visit: Payer: Self-pay

## 2013-10-28 MED ORDER — AMLODIPINE BESYLATE 5 MG PO TABS: 5.0000 mg | ORAL_TABLET | Freq: Every day | ORAL | Status: DC

## 2013-10-28 MED ORDER — ISOSORBIDE MONONITRATE ER 60 MG PO TB24: 60.0000 mg | ORAL_TABLET | Freq: Every day | ORAL | Status: DC

## 2013-10-28 NOTE — Telephone Encounter (Signed)
Rx was sent to pharmacy electronically. 

## 2013-11-11 ENCOUNTER — Other Ambulatory Visit: Payer: Self-pay | Admitting: Cardiology

## 2013-11-12 ENCOUNTER — Other Ambulatory Visit: Payer: Self-pay | Admitting: Cardiology

## 2013-11-12 NOTE — Telephone Encounter (Signed)
Rx was sent to pharmacy electronically. 

## 2013-11-15 ENCOUNTER — Telehealth: Payer: Self-pay | Admitting: *Deleted

## 2013-11-22 ENCOUNTER — Encounter: Payer: Self-pay | Admitting: *Deleted

## 2013-11-22 ENCOUNTER — Ambulatory Visit (INDEPENDENT_AMBULATORY_CARE_PROVIDER_SITE_OTHER): Payer: Medicare Other | Admitting: Cardiology

## 2013-11-22 ENCOUNTER — Encounter: Payer: Self-pay | Admitting: Cardiology

## 2013-11-22 VITALS — BP 138/76 | HR 81 | Ht 72.0 in | Wt 268.3 lb

## 2013-11-22 DIAGNOSIS — R0609 Other forms of dyspnea: Secondary | ICD-10-CM

## 2013-11-22 DIAGNOSIS — Z79899 Other long term (current) drug therapy: Secondary | ICD-10-CM

## 2013-11-22 DIAGNOSIS — I878 Other specified disorders of veins: Secondary | ICD-10-CM

## 2013-11-22 DIAGNOSIS — I872 Venous insufficiency (chronic) (peripheral): Secondary | ICD-10-CM

## 2013-11-22 DIAGNOSIS — Z789 Other specified health status: Secondary | ICD-10-CM

## 2013-11-22 DIAGNOSIS — E785 Hyperlipidemia, unspecified: Secondary | ICD-10-CM

## 2013-11-22 DIAGNOSIS — R0989 Other specified symptoms and signs involving the circulatory and respiratory systems: Secondary | ICD-10-CM

## 2013-11-22 DIAGNOSIS — I10 Essential (primary) hypertension: Secondary | ICD-10-CM

## 2013-11-22 DIAGNOSIS — Z888 Allergy status to other drugs, medicaments and biological substances status: Secondary | ICD-10-CM

## 2013-11-22 DIAGNOSIS — I2510 Atherosclerotic heart disease of native coronary artery without angina pectoris: Secondary | ICD-10-CM

## 2013-11-22 NOTE — Patient Instructions (Addendum)
Continue with current medication  You may alternate PLAVIX with aspirin take for a month to see which one cause less stomach issue. You may take the the one that does not cause you the less problemss.  Your physician wants you to follow-up in 12 month Dr Ellyn Hack.  You will receive a reminder letter in the mail two months in advance. If you don't receive a letter, please call our office to schedule the follow-up appointment.

## 2013-11-24 ENCOUNTER — Encounter: Payer: Self-pay | Admitting: Cardiology

## 2013-11-24 DIAGNOSIS — I878 Other specified disorders of veins: Secondary | ICD-10-CM | POA: Insufficient documentation

## 2013-11-24 NOTE — Assessment & Plan Note (Signed)
He still remains and the relatively early stages of venous stasis insufficiency. He says that his edema really did not bother him that much he doesn't have any other significant symptoms. At this point I recommend support stockings. If the edema gets worse, we may need to switch the HCTZ to Lasix.

## 2013-11-24 NOTE — Assessment & Plan Note (Signed)
Adequate control, a little bit his mom would expect. He is on max dose of ARB/HCTZ and amlodipine, but could consider increasing his beta blocker versus converting to carvedilol if his blood pressure were to get higher. For now would continue current regimen.

## 2013-11-24 NOTE — Assessment & Plan Note (Signed)
Tolerating the intermittent dose of simvastatin. Will have to be happy enough with that. He is due for her followup lipids and chemistry panel. I will dose. We can make further adjustments or recommendations based on the results. Low threshold of considering enlisting the assistance of our [facility] in [facility].

## 2013-11-24 NOTE — Assessment & Plan Note (Signed)
Continues to be doing well with no active symptoms. Recent stress test last year negative for ischemia. During last visit, I recommended that he could stop his Plavix, however due to concern, his wife insisted that he continue taking it. At this point, he is noting a little but more GERD-type sensation, and would like to potentially come off of the PPI.  Think we can try a month each of action along or Plavix alone to see which one because it makes his GERD less irritated. Either way he should be on either aspirin or Plavix but none so that a combination of both. He is also on a statin and tolerating it decent dose, beta blocker, ARB and calcium channel blocker plus Imdur.  Continue current regimen.

## 2013-11-24 NOTE — Progress Notes (Signed)
Patient ID: Arthur Lutz, male   DOB: 1941/04/12, 73 y.o.   MRN: [id number] Primary Care Provider: Leonides Grills, MD  Clinic Note:  Chief Complaint  Patient presents with  . Annual Exam    no chest pain , no sob , sinus problem, no edema , knee pain nothing new, no labs lately   HPI: Arthur Lutz is a 73 y.o. male with a PMH below who presents today for routine follow-up.  He was a former patient of Dr. Chase Picket, who's CAD history dates back to 2009 had a "mild heart attack".  He underwent PCI of the RCA &  balloon PTCA to the ostium of the first diagonal branch. In January 2013 he then had PCI of the Circumflex and Cutting Balloon PTCA of the a diagonal branch. These sites were all patent in August 2013 hematocrit catheterization. .  I last saw him in July 2014 for pre-operative assessment for Re-do Knee Arthroplasty. He had a negative Myoview in February 2014 as part of preoperative risk assessment  Interval History: He presents today relatively stable overall from a cardiac standpoint. Unfortunately, he has gained more weight since his last visit, stating that his knees really keep her from doing much in the way of any exercise. He is hoping to join the gym again soon in order to get back into doing some water type activities including swimming and water walking. He is a bit frustrated, stating that his knee surgeries really never helped him become more mobile. For cardiac standpoint he seems to be relatively stable. He says if he really pushes it with exertion he'll have a little chest discomfort, otherwise mild exertional dyspnea as expected with his deconditioning. No PND or orthopnea, but does have mild edema with mild discoloration of his feet and ankles.  He has a little bit of heaviness in the feet and in the day as well as some tingling sensations and burning. No restless leg type symptoms or severe wounds or lesions. He denies any rapid or irregular heartbeat/palpitations.  No lightheadedness, dizziness, syncope/near syncope or TIAs with amaurosis fugax symptoms. No melena, hematochezia, hematuria, epistaxis rate no claudication symptoms.  Interestingly, he asked [place] to be doing relatively well on 10 mg of Zocor  Past Medical History  Diagnosis Date  . History of heart attack 11/2007    "mild; did not affect the heart muscle"; PCI to RCA, Cutting PTCA Diag ostium  . CAD S/P percutaneous coronary angioplasty 08/07/2011    NEW 08/07/11 cutting balloon PTCA to 95% ostial diag. and 90%  proximal LCX.  prior cutting balloon and to RCA  & ostium of ostial diag; Last Cath 02/2012 - Patent Diag PTCA, RCA & Circ BMS stents. Normal EF & EDP; Myoview 2/14 no evidence of ischemia or infarction  . Dyslipidemia   . Hypertension   . Diabetes mellitus type 2, controlled, with complications   . 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 Melanoma of skin ~ 2005    "level 2; on my back"  . Bladder cancer 11/2005  . Statin intolerance 08/07/2011  . Arthritis of knee, left, needs total Knee in future with Dr. Wynelle Link  08/07/2011  . Portacath in place 08/11/2012  . Heart murmur     Aortic sclerosis  . Osteoarthritis     Status post right knee arthroplasty, positive for staph infection. Pending right arthroplasty  . Allergic rhinitis   .  Diabetes mellitus without complication     Past CardioVascular Evaluations/Procedures  Procedure Laterality Date  . Doppler echocardiography  May 2009    Normal EF, impaired relaxation, with sclerotic aortic valve. No stenosis.  Marland Kitchen Nm myoview ltd  Jan. 31, 2014    Low risk, EF 49%, improved from prior. Small apical and apical septal defect, fixed likely artifact no skin or infarction.  . Coronary angioplasty with stent placement  11/2007    Mid RCA - Driver BMS 3.5 mm x 15 mm; 2.0 mm Cutting Balloon PTCA - ostial D1  . Coronary angioplasty with stent placement  08/07/11    Abnormal TM Myoview - for  exertional throat discomfort: Patent RCA stent, 90% circumflex -- Vision BMS 3.5 mm x 12 mm --> 4.2 mm. Ostial D2 95% -- 2.25 mm Cutting Balloon PTCA  . Cardiac catheterization  August '13    Widely patent RCA and LCx stents. Also patent PTCA site to D1, ~40-50% D2. Normal EF, normal EDP    Allergies  Allergen Reactions  . Statins Other (See Comments)    Myalgias, tired crestor -aches     Current Outpatient Prescriptions  Medication Sig Dispense Refill  . amLODipine (NORVASC) 10 MG tablet TAKE 1 TABLET BY MOUTH EVERY DAY  30 tablet  1  . aspirin EC 81 MG tablet Take 81 mg by mouth daily.      . cholecalciferol (VITAMIN D) 1000 UNITS tablet Take 1,000 Units by mouth daily.      . clopidogrel (PLAVIX) 75 MG tablet Take 75 mg by mouth daily.      Marland Kitchen dexlansoprazole (DEXILANT) 60 MG capsule Take 1 capsule (60 mg total) by mouth daily.  90 capsule  3  . diphenhydrAMINE (BENADRYL) 25 mg capsule Take 25 mg by mouth at bedtime as needed for sleep.      . isosorbide mononitrate (IMDUR) 60 MG 24 hr tablet Take 1 tablet (60 mg total) by mouth daily.  90 tablet  0  . losartan-hydrochlorothiazide (HYZAAR) 100-25 MG per tablet Take 1 tablet by mouth daily.  90 tablet  3  . metFORMIN (GLUCOPHAGE) 500 MG tablet Take 500 mg by mouth 2 (two) times daily with a meal.      . metoprolol succinate (TOPROL-XL) 25 MG 24 hr tablet Take 0.5 tablets (12.5 mg total) by mouth daily after breakfast.  45 tablet  0  . acetaminophen (TYLENOL) 325 MG tablet Take 2 tablets (650 mg total) by mouth every 6 (six) hours as needed.  60 tablet  0  . simvastatin (ZOCOR) 10 MG tablet Take 1/2 tablet a day by mouth       No current facility-administered medications for this visit.   Social & Family History Reviewed in Epic, no notable changes.  ROS: A comprehensive Review of Systems - Negative except Pertinent positives noted above, mostly related to bilateral knee pain.. Otherwise waking, and mild edema with skin  changes.  PHYSICAL EXAM BP 138/76  Pulse 81  Ht 6' (1.829 m)  Wt 268 lb 4.8 oz (121.7 kg)  BMI 36.38 kg/m2 General, he is a pleasant, healthy-appearing gentleman who is mildly to moderately obese; No acute distress, alert and oriented x3, answers questions appropriately.  HEENT: NCAT. EOMI. MMM. Anicteric sclerae.  Neck: Supple. No LAN, JVD, or carotid bruit.  Heart: RRR. Normal S1, S2. He has a 1/6 crescendo-decrescendo SEM heard at the right upper sternal border consistent with aortic sclerosis. No R/G.  Lungs: CTAB. Nonlabored. Normal respiratory effort. Good  air movement. No wheezes, rales, rhonchi.  Abdomen: Obese, but otherwise soft, NT, ND. NABS. No HSM.  Extremities: Maybe 1+ lower extremity edema, but 2+ equal pulses throughout. He does have mild venous stasis dermatitis changes.  FTD:DUKGURKYH today: Yes Rate: 81, Rhythm: Normal Sinus; Non-specific ST-T changes; possible mild left atrial enlargement; normal axis and intervals.  Recent Labs: Not available  ASSESSMENT / PLAN: Fairly stable cardiac standpoint. Blood pressures elevated and he did have one episode where is quite high. Will need adjustments medications.  CAD S/P percutaneous coronary angioplasty: 08/07/11 cutting balloon PTCA to 95% ostial diag. and 90%  proximal LCX.  prior cutting balloon to RCA  & ostium of ostial diag., 2009  Continues to be doing well with no active symptoms. Recent stress test last year negative for ischemia. During last visit, I recommended that he could stop his Plavix, however due to concern, his wife insisted that he continue taking it. At this point, he is noting a little but more GERD-type sensation, and would like to potentially come off of the PPI.  Think we can try a month each of action along or Plavix alone to see which one because it makes his GERD less irritated. Either way he should be on either aspirin or Plavix but none so that a combination of both. He is also on a statin and  tolerating it decent dose, beta blocker, ARB and calcium channel blocker plus Imdur.  Continue current regimen.  HTN (hypertension) Adequate control, a little bit his mom would expect. He is on max dose of ARB/HCTZ and amlodipine, but could consider increasing his beta blocker versus converting to carvedilol if his blood pressure were to get higher. For now would continue current regimen.  Venous stasis of both lower extremities He still remains and the relatively early stages of venous stasis insufficiency. He says that his edema really did not bother him that much he doesn't have any other significant symptoms. At this point I recommend support stockings. If the edema gets worse, we may need to switch the HCTZ to Lasix.  Hyperlipemia, intolerant to statins. Tolerating the intermittent dose of simvastatin. Will have to be happy enough with that. He is due for her followup lipids and chemistry panel. I will dose. We can make further adjustments or recommendations based on the results. Low threshold of considering enlisting the assistance of our [facility] in [facility].   Orders Placed This Encounter  Procedures  . Lipid panel    Order Specific Question:  Has the patient fasted?    Answer:  Yes  . Comprehensive metabolic panel    Order Specific Question:  Has the patient fasted?    Answer:  Yes  . EKG 12-Lead    Followup: 6 months with APP, 12 months with Dr. Ellyn Hack.   Leonie Man, M.D., M.S. Interventional Cardiologist  [place] Pager # [phone] 11/24/2013

## 2013-11-26 ENCOUNTER — Encounter (HOSPITAL_COMMUNITY)
Admission: RE | Admit: 2013-11-26 | Discharge: 2013-11-26 | Disposition: A | Discharge status: Home / Self Care | Payer: Medicare Other | Source: Ambulatory Visit | Attending: Physician Assistant | Admitting: Physician Assistant

## 2013-11-26 DIAGNOSIS — Z452 Encounter for adjustment and management of vascular access device: Secondary | ICD-10-CM | POA: Insufficient documentation

## 2013-11-26 MED ORDER — HEPARIN SOD (PORK) LOCK FLUSH 100 UNIT/ML IV SOLN
INTRAVENOUS | Status: AC
  Filled 2013-11-26: qty 5

## 2013-11-26 MED ORDER — HEPARIN SOD (PORK) LOCK FLUSH 100 UNIT/ML IV SOLN
500.0000 [IU] | INTRAVENOUS | Status: AC | PRN
  Administered 2013-11-26: 500 [IU]

## 2013-11-26 MED ORDER — SODIUM CHLORIDE 0.9 % IJ SOLN
INTRAMUSCULAR | Status: AC
  Filled 2013-11-26: qty 10

## 2013-11-26 MED ORDER — SODIUM CHLORIDE 0.9 % IJ SOLN
10.0000 mL | INTRAMUSCULAR | Status: AC | PRN
  Administered 2013-11-26: 10 mL

## 2013-11-30 ENCOUNTER — Other Ambulatory Visit (HOSPITAL_COMMUNITY): Payer: Self-pay | Admitting: Dermatology

## 2013-11-30 DIAGNOSIS — R0602 Shortness of breath: Secondary | ICD-10-CM

## 2013-11-30 DIAGNOSIS — R911 Solitary pulmonary nodule: Secondary | ICD-10-CM

## 2013-12-03 LAB — LIPID PANEL
Cholesterol: 166 mg/dL (ref 0–200)
HDL: 37 mg/dL — AB (ref >39)
LDL CALC: 98 mg/dL (ref 0–99)
TRIGLYCERIDES: 157 mg/dL — AB (ref <150)
Total CHOL/HDL Ratio: 4.5 Ratio
VLDL: 31 mg/dL (ref 0–40)

## 2013-12-03 LAB — COMPREHENSIVE METABOLIC PANEL
ALK PHOS: 63 U/L (ref 39–117)
ALT: 31 U/L (ref 0–53)
AST: 20 U/L (ref 0–37)
Albumin: 4 g/dL (ref 3.5–5.2)
BILIRUBIN TOTAL: 0.7 mg/dL (ref 0.2–1.2)
BUN: 11 mg/dL (ref 6–23)
CO2: 30 meq/L (ref 19–32)
CREATININE: 0.81 mg/dL (ref 0.50–1.35)
Calcium: 9 mg/dL (ref 8.4–10.5)
Chloride: 102 mEq/L (ref 96–112)
Glucose, Bld: 124 mg/dL — ABNORMAL HIGH (ref 70–99)
Potassium: 3.9 mEq/L (ref 3.5–5.3)
SODIUM: 143 meq/L (ref 135–145)
TOTAL PROTEIN: 6.1 g/dL (ref 6.0–8.3)

## 2013-12-03 NOTE — Telephone Encounter (Signed)
Encounter Closed---5/8 TP 

## 2013-12-07 ENCOUNTER — Ambulatory Visit (HOSPITAL_COMMUNITY)
Admission: RE | Admit: 2013-12-07 | Discharge: 2013-12-07 | Disposition: A | Discharge status: Home / Self Care | Payer: Medicare Other | Source: Ambulatory Visit | Attending: Dermatology | Admitting: Dermatology

## 2013-12-07 DIAGNOSIS — R0602 Shortness of breath: Secondary | ICD-10-CM

## 2013-12-07 DIAGNOSIS — E119 Type 2 diabetes mellitus without complications: Secondary | ICD-10-CM | POA: Insufficient documentation

## 2013-12-07 DIAGNOSIS — R911 Solitary pulmonary nodule: Secondary | ICD-10-CM

## 2013-12-07 DIAGNOSIS — Z9861 Coronary angioplasty status: Secondary | ICD-10-CM | POA: Insufficient documentation

## 2013-12-07 DIAGNOSIS — R918 Other nonspecific abnormal finding of lung field: Secondary | ICD-10-CM | POA: Insufficient documentation

## 2013-12-07 DIAGNOSIS — I252 Old myocardial infarction: Secondary | ICD-10-CM | POA: Insufficient documentation

## 2013-12-07 DIAGNOSIS — C679 Malignant neoplasm of bladder, unspecified: Secondary | ICD-10-CM | POA: Insufficient documentation

## 2013-12-07 DIAGNOSIS — M5030 Other cervical disc degeneration, unspecified cervical region: Secondary | ICD-10-CM | POA: Insufficient documentation

## 2013-12-07 DIAGNOSIS — I10 Essential (primary) hypertension: Secondary | ICD-10-CM | POA: Insufficient documentation

## 2013-12-07 DIAGNOSIS — I2510 Atherosclerotic heart disease of native coronary artery without angina pectoris: Secondary | ICD-10-CM | POA: Insufficient documentation

## 2013-12-07 DIAGNOSIS — K7689 Other specified diseases of liver: Secondary | ICD-10-CM | POA: Insufficient documentation

## 2013-12-07 DIAGNOSIS — Z85820 Personal history of malignant melanoma of skin: Secondary | ICD-10-CM | POA: Insufficient documentation

## 2013-12-07 MED ORDER — IOHEXOL 300 MG/ML  SOLN
80.0000 mL | Freq: Once | INTRAMUSCULAR | Status: AC | PRN
  Administered 2013-12-07: 80 mL via INTRAVENOUS

## 2013-12-08 ENCOUNTER — Telehealth: Payer: Self-pay | Admitting: *Deleted

## 2013-12-08 ENCOUNTER — Encounter: Payer: Self-pay | Admitting: *Deleted

## 2013-12-08 DIAGNOSIS — Z79899 Other long term (current) drug therapy: Secondary | ICD-10-CM

## 2013-12-08 DIAGNOSIS — E782 Mixed hyperlipidemia: Secondary | ICD-10-CM

## 2013-12-08 NOTE — Telephone Encounter (Signed)
Mailed letter °

## 2013-12-08 NOTE — Telephone Encounter (Signed)
Message copied by Raiford Simmonds on Wed Dec 08, 2013 11:34 AM ------      Message from: Ellyn Hack, DAVID W      Created: Fri Dec 03, 2013  9:42 AM       Overall, not too bad -- LDL is a bit higher that goal of <70 & Triglycerides are just slightly high.  Will continue to monitor -- ~6 month recheck.            Leonie Man, MD       ------

## 2013-12-08 NOTE — Telephone Encounter (Signed)
Spoke to patient.  LIPIDS  And CMP Result given . Verbalized understanding  PATIENT REQUEST A COPY . LETTER WILL BE SENT TO PATIENT. PER ORDER FROM DR  HARDING RECHECK IN 6 MONTHS

## 2013-12-09 NOTE — Progress Notes (Signed)
This encounter was created in error - please disregard.

## 2013-12-21 ENCOUNTER — Other Ambulatory Visit: Payer: Self-pay

## 2013-12-21 DIAGNOSIS — K219 Gastro-esophageal reflux disease without esophagitis: Secondary | ICD-10-CM

## 2013-12-21 DIAGNOSIS — R140 Abdominal distension (gaseous): Secondary | ICD-10-CM

## 2013-12-21 MED ORDER — CLOPIDOGREL BISULFATE 75 MG PO TABS: 75.0000 mg | ORAL_TABLET | Freq: Every day | ORAL | Status: DC

## 2013-12-21 NOTE — Telephone Encounter (Signed)
Rx was sent to pharmacy electronically. 

## 2013-12-24 MED ORDER — CLOPIDOGREL BISULFATE 75 MG PO TABS: 75.0000 mg | ORAL_TABLET | Freq: Every day | ORAL | Status: DC

## 2013-12-24 NOTE — Addendum Note (Signed)
Addended by: Diana Eves on: 12/24/2013 10:50 AM   Modules accepted: Orders

## 2014-03-02 ENCOUNTER — Other Ambulatory Visit: Payer: Self-pay | Admitting: *Deleted

## 2014-03-02 MED ORDER — AMLODIPINE BESYLATE 10 MG PO TABS: 10.0000 mg | ORAL_TABLET | Freq: Every day | ORAL | Status: DC

## 2014-03-02 MED ORDER — ISOSORBIDE MONONITRATE ER 60 MG PO TB24: 60.0000 mg | ORAL_TABLET | Freq: Every day | ORAL | Status: DC

## 2014-03-02 NOTE — Telephone Encounter (Signed)
Rx refill sent to patient pharmacy   

## 2014-03-18 ENCOUNTER — Other Ambulatory Visit: Payer: Self-pay | Admitting: Cardiology

## 2014-03-21 NOTE — Telephone Encounter (Signed)
Rx was sent to pharmacy electronically. 

## 2014-05-12 ENCOUNTER — Telehealth: Payer: Self-pay | Admitting: *Deleted

## 2014-05-12 DIAGNOSIS — Z79899 Other long term (current) drug therapy: Secondary | ICD-10-CM

## 2014-05-12 DIAGNOSIS — E782 Mixed hyperlipidemia: Secondary | ICD-10-CM

## 2014-05-12 NOTE — Telephone Encounter (Signed)
Lab slip -CMP,lipid and letter mailed

## 2014-05-12 NOTE — Telephone Encounter (Signed)
Message copied by Raiford Simmonds on Thu May 12, 2014  9:40 AM ------      Message from: [place], [place]: Wed Dec 08, 2013 11:54 AM       Mail lab slip cmp ,lipid in oct 2015      Due in nov2015 ------

## 2014-05-25 ENCOUNTER — Encounter (HOSPITAL_COMMUNITY): Payer: Self-pay

## 2014-05-25 ENCOUNTER — Encounter (HOSPITAL_COMMUNITY)
Admission: RE | Admit: 2014-05-25 | Discharge: 2014-05-25 | Disposition: A | Discharge status: Home / Self Care | Payer: Medicare Other | Source: Ambulatory Visit | Attending: Family Medicine | Admitting: Family Medicine

## 2014-05-25 DIAGNOSIS — Z9889 Other specified postprocedural states: Secondary | ICD-10-CM | POA: Insufficient documentation

## 2014-05-25 DIAGNOSIS — Z452 Encounter for adjustment and management of vascular access device: Secondary | ICD-10-CM | POA: Insufficient documentation

## 2014-05-25 MED ORDER — SODIUM CHLORIDE 0.9 % IJ SOLN
10.0000 mL | INTRAMUSCULAR | Status: AC | PRN
  Administered 2014-05-25: 10 mL
  Filled 2014-05-25: qty 10

## 2014-05-25 MED ORDER — HEPARIN SOD (PORK) LOCK FLUSH 100 UNIT/ML IV SOLN
500.0000 [IU] | INTRAVENOUS | Status: AC | PRN
  Administered 2014-05-25: 500 [IU]

## 2014-05-25 MED ORDER — HEPARIN SOD (PORK) LOCK FLUSH 100 UNIT/ML IV SOLN
INTRAVENOUS | Status: AC
Start: 2014-05-25 — End: 2014-05-25
  Filled 2014-05-25: qty 5

## 2014-05-25 NOTE — Progress Notes (Addendum)
Arrived today for a port flush, good blood return, flushed without difficulty. Pt refuses to make followup appointment, "I'll call you when I need it flushed again."

## 2014-06-02 LAB — COMPREHENSIVE METABOLIC PANEL
ALT: 19 U/L (ref 0–53)
AST: 19 U/L (ref 0–37)
Albumin: 4.5 g/dL (ref 3.5–5.2)
Alkaline Phosphatase: 76 U/L (ref 39–117)
BUN: 12 mg/dL (ref 6–23)
CO2: 32 mEq/L (ref 19–32)
CREATININE: 0.93 mg/dL (ref 0.50–1.35)
Calcium: 9.3 mg/dL (ref 8.4–10.5)
Chloride: 102 mEq/L (ref 96–112)
Glucose, Bld: 119 mg/dL — ABNORMAL HIGH (ref 70–99)
POTASSIUM: 4.3 meq/L (ref 3.5–5.3)
Sodium: 141 mEq/L (ref 135–145)
Total Bilirubin: 0.6 mg/dL (ref 0.2–1.2)
Total Protein: 6.5 g/dL (ref 6.0–8.3)

## 2014-06-02 LAB — LIPID PANEL
CHOL/HDL RATIO: 4.4 ratio
Cholesterol: 155 mg/dL (ref 0–200)
HDL: 35 mg/dL — ABNORMAL LOW (ref >39)
LDL Cholesterol: 99 mg/dL (ref 0–99)
TRIGLYCERIDES: 107 mg/dL (ref <150)
VLDL: 21 mg/dL (ref 0–40)

## 2014-06-13 ENCOUNTER — Encounter: Payer: Self-pay | Admitting: *Deleted

## 2014-06-13 ENCOUNTER — Telehealth: Payer: Self-pay | Admitting: *Deleted

## 2014-06-13 NOTE — Telephone Encounter (Signed)
Spoke to patient. Result given . Verbalized understanding Patient states he will try to increase up to 10 mg simvastatin- patient states he develop leg cramps with the increase before. RN informed patient to contact office if symptoms return. Patient request result to be mail.

## 2014-06-13 NOTE — Telephone Encounter (Signed)
-----   Message from Leonie Man, MD sent at 06/13/2014 11:32 AM EST ----- Stable lipids - if he can try to increase frequency of taking 10mg  Simvastatin - that would potentially help. Glucose is a bit better.  Leonie Man, MD

## 2014-06-24 ENCOUNTER — Telehealth: Payer: Self-pay | Admitting: Cardiology

## 2014-06-24 NOTE — Telephone Encounter (Signed)
New Msg   Patient is wanting a prescription written for Triazolam .05 mg. Patient states he is unable to sleep because PCP has retired and would like medications. Patient uses [facility] in [place] on [address]. Please contact at [phone].

## 2014-06-28 NOTE — Telephone Encounter (Signed)
Send to Dr. Ellyn Hack at [place].

## 2014-06-28 NOTE — Telephone Encounter (Signed)
This type of Rx requires being seen in office.   He should have a 6 month f/u appt coming up. Can discuss then. I usually ask PCPs to do this med, so would only be short term Rx.  Leonie Man, MD

## 2014-06-28 NOTE — Telephone Encounter (Signed)
Spoke to patient informed need an appointment Patient recieved a month supply- has not been able to see new doctor as of yet in Dr  Orson Ape 's office. Appointment schedule for 07/04/14 at 10 am will notify scheduler to place on schedule

## 2014-06-28 NOTE — Telephone Encounter (Signed)
Please defer to Dr. Ellyn Hack. This is his patient.  Dr. Debara Pickett

## 2014-06-28 NOTE — Telephone Encounter (Signed)
Will forward to dr hilty for okay to prescribe.

## 2014-07-04 ENCOUNTER — Ambulatory Visit (INDEPENDENT_AMBULATORY_CARE_PROVIDER_SITE_OTHER): Payer: Medicare Other | Admitting: Cardiology

## 2014-07-04 ENCOUNTER — Encounter: Payer: Self-pay | Admitting: Cardiology

## 2014-07-04 VITALS — BP 138/66 | HR 53 | Ht 72.0 in | Wt 262.0 lb

## 2014-07-04 DIAGNOSIS — Z9861 Coronary angioplasty status: Secondary | ICD-10-CM

## 2014-07-04 DIAGNOSIS — E118 Type 2 diabetes mellitus with unspecified complications: Secondary | ICD-10-CM

## 2014-07-04 DIAGNOSIS — I878 Other specified disorders of veins: Secondary | ICD-10-CM

## 2014-07-04 DIAGNOSIS — R0600 Dyspnea, unspecified: Secondary | ICD-10-CM

## 2014-07-04 DIAGNOSIS — E785 Hyperlipidemia, unspecified: Secondary | ICD-10-CM

## 2014-07-04 DIAGNOSIS — F5102 Adjustment insomnia: Secondary | ICD-10-CM

## 2014-07-04 DIAGNOSIS — I2510 Atherosclerotic heart disease of native coronary artery without angina pectoris: Secondary | ICD-10-CM

## 2014-07-04 DIAGNOSIS — Z789 Other specified health status: Secondary | ICD-10-CM

## 2014-07-04 DIAGNOSIS — E66811 Obesity, class 1: Secondary | ICD-10-CM

## 2014-07-04 DIAGNOSIS — R0609 Other forms of dyspnea: Secondary | ICD-10-CM

## 2014-07-04 DIAGNOSIS — E669 Obesity, unspecified: Secondary | ICD-10-CM

## 2014-07-04 DIAGNOSIS — I10 Essential (primary) hypertension: Secondary | ICD-10-CM

## 2014-07-04 DIAGNOSIS — Z889 Allergy status to unspecified drugs, medicaments and biological substances status: Secondary | ICD-10-CM

## 2014-07-04 MED ORDER — TRIAZOLAM 0.25 MG PO TABS: 0.2500 mg | ORAL_TABLET | Freq: Every evening | ORAL | Status: DC | PRN

## 2014-07-04 NOTE — Progress Notes (Signed)
Patient ID: Arthur Lutz, male   DOB: [birth year], 73 y.o.   MRN: [id number]  Primary Care Provider: Glo Herring., MD  Clinic Note:  Chief Complaint  Patient presents with  . Follow-up    8 month  . Coronary Artery Disease    Bare-metal stent to mid RCA  . Hypertension  . Hyperlipidemia  . Peripheral Neuropathy    Diabetic neuropathy and also possible mild venous stasis  . Insomnia   HPI: Arthur Lutz is a 73 y.o. male with a PMH below who presents today for routine follow-up.  He was a former patient of Dr. Chase Picket, who's CAD history dates back to 2009 had a "mild heart attack".  He underwent PCI of the RCA &  balloon PTCA to the ostium of the first diagonal branch. In January 2013 he then had PCI of the Circumflex and Cutting Balloon PTCA of the a diagonal branch. These sites were all patent in August 2013 repeat cardiac catheterization.  He had a negative Myoview in February 2014 as part of preoperative risk assessment. When I last saw him in April 2015, he had been on some weight, and was really being bothered by his arthritis pains. He is statin intolerant. He has recurrent bladder cancer. He was recently diagnosed with macular degeneration on his right eye and is getting shots.  Interval History: Today he really doesn't have much in the way of cardiac complaints. If he really exerts himself going up and down several flights of steps or running up hill he may notice a little bit of chest tightness or exertional dyspnea. But otherwise really is bothered more by his weight and arthritis pains. He really doesn't have any significant chest tightness or pressure unless he really does push himself. He denies any significant PND, orthopnea but does have mild lower extremity edema. He has some discoloration in his legs and also has some restless leg syndrome type symptoms. His shins and calves bother him mostly at rest and not with walking. Actually walking makes it better.   He describes having a heaviness sensation in his feet with tingling and burning.   He denies any rapid or irregular heartbeat/palpitations. No lightheadedness, dizziness, syncope/near syncope or TIAs with amaurosis fugax symptoms. No melena, hematochezia, hematuria, epistaxis rate no claudication symptoms.  Interestingly,has been tolerating 10 mg of Zocor relatively well.  Past Medical History  Diagnosis Date  . History of heart attack 11/2007    "mild; did not affect the heart muscle"; PCI to RCA, Cutting PTCA Diag ostium  . CAD S/P percutaneous coronary angioplasty 08/07/2011    NEW 08/07/11 cutting balloon PTCA to 95% ostial diag. and 90%  proximal LCX.  prior cutting balloon and to RCA  & ostium of ostial diag; Last Cath 02/2012 - Patent Diag PTCA, RCA & Circ BMS stents. Normal EF & EDP; Myoview 2/14 no evidence of ischemia or infarction  . Dyslipidemia   . Hypertension   . Diabetes mellitus type 2, controlled, with complications   . 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 Melanoma of skin ~ 2005    "level 2; on my back"  . Bladder cancer 11/2005  . Statin intolerance 08/07/2011  . Arthritis of knee, left, needs total Knee in future with Dr. Wynelle Link  08/07/2011  . Portacath in place 08/11/2012  . Aortic valve sclerosis     Cause of murmur  .  Osteoarthritis     Status post right knee arthroplasty, positive for staph infection. Pending right arthroplasty  . Allergic rhinitis   . Macular degeneration of right eye     Now getting shots.     Past CardioVascular Evaluations/Procedures  Procedure Laterality Date  . Doppler echocardiography  May 2009    Normal EF, impaired relaxation, with sclerotic aortic valve. No stenosis.  Marland Kitchen Nm myoview ltd  Jan. 31, 2014    Low risk, EF 49%, improved from prior. Small apical and apical septal defect, fixed likely artifact no skin or infarction.  . Coronary angioplasty with stent placement  11/2007     Mid RCA - Driver BMS 3.5 mm x 15 mm; 2.0 mm Cutting Balloon PTCA - ostial D1  . Coronary angioplasty with stent placement  08/07/11    Abnormal TM Myoview - for exertional throat discomfort: Patent RCA stent, 90% circumflex -- Vision BMS 3.5 mm x 12 mm --> 4.2 mm. Ostial D2 95% -- 2.25 mm Cutting Balloon PTCA  . Cardiac catheterization  August '13    Widely patent RCA and LCx stents. Also patent PTCA site to D1, ~40-50% D2. Normal EF, normal EDP    Allergies  Allergen Reactions  . Statins Other (See Comments)    Myalgias, tired crestor -aches     Current Outpatient Prescriptions  Medication Sig Dispense Refill  . acetaminophen (TYLENOL) 325 MG tablet Take 2 tablets (650 mg total) by mouth every 6 (six) hours as needed. 60 tablet 0  . amLODipine (NORVASC) 10 MG tablet Take 1 tablet (10 mg total) by mouth daily. 90 tablet 2  . aspirin EC 81 MG tablet Take 81 mg by mouth daily.    Marland Kitchen dexlansoprazole (DEXILANT) 60 MG capsule Take 1 capsule (60 mg total) by mouth daily. 90 capsule 3  . diphenhydrAMINE (BENADRYL) 25 mg capsule Take 25 mg by mouth at bedtime as needed for sleep.    . isosorbide mononitrate (IMDUR) 60 MG 24 hr tablet Take 1 tablet (60 mg total) by mouth daily. 90 tablet 2  . losartan-hydrochlorothiazide (HYZAAR) 100-25 MG per tablet Take 1 tablet by mouth daily. (Patient taking differently: Take 1 tablet by mouth daily. TAKE HALF A TABLET) 90 tablet 3  . metFORMIN (GLUCOPHAGE) 500 MG tablet Take 500 mg by mouth 2 (two) times daily with a meal.    . metoprolol succinate (TOPROL-XL) 25 MG 24 hr tablet Take 0.5 tablets (12.5 mg total) by mouth daily. 15 tablet 8  . simvastatin (ZOCOR) 10 MG tablet Take 1/2 tablet a day by mouth    . triazolam (HALCION) 0.25 MG tablet Take 1-2 tablets (0.25-0.5 mg total) by mouth at bedtime as needed for sleep. 180 tablet 0   No current facility-administered medications for this visit.   Social & Family History Reviewed in Epic, no notable  changes.  ROS: A comprehensive Review of Systems -  was performed Review of Systems  Constitutional: Positive for weight loss (He has lost a little bit of weight since his last visit with [place]).  HENT: Negative for nosebleeds.   Cardiovascular: Positive for leg swelling (See above). Negative for palpitations and claudication.  Gastrointestinal: Positive for heartburn. Negative for blood in stool and melena.  Genitourinary: Negative for hematuria.  Musculoskeletal: Positive for joint pain (Bilateral knees and hip).  Skin:       Venous stasis changes in bilateral lower legs  Neurological: Positive for tingling (In hands and feet mostly in the feet and calves. This  occurs mostly at night.). Negative for seizures.  Endo/Heme/Allergies: Does not bruise/bleed easily.  Psychiatric/Behavioral: Negative for depression and memory loss. The patient has insomnia (Is upset, because with a retirement of his primary care physician he has not been getting his when necessary triazolam that is the only thing that helps sleep).   All other systems reviewed and are negative.   Wt Readings from Last 3 Encounters:  07/04/14 262 lb (118.842 kg)  05/25/14 262 lb (118.842 kg)  11/22/13 268 lb 4.8 oz (121.7 kg)    PHYSICAL EXAM BP 138/66 mmHg  Pulse 53  Ht 6' (1.829 m)  Wt 262 lb (118.842 kg)  BMI 35.53 kg/m2 General, he is a pleasant, healthy-appearing gentleman who is mildly to moderately obese; No acute distress, alert and oriented x3, answers questions appropriately.  HEENT: NCAT. EOMI. MMM. Anicteric sclerae.  Neck: Supple. No LAN, JVD, or carotid bruit.  Heart: RRR. Normal S1, S2. He has a 1/6 crescendo-decrescendo SEM heard at the right upper sternal border consistent with aortic sclerosis. No R/G.  Lungs: CTAB. Nonlabored. Normal respiratory effort. Good air movement. No wheezes, rales, rhonchi.  Abdomen: Obese, but otherwise soft, NT, ND. NABS. No HSM.  Extremities: Maybe 1+ lower extremity edema,  but 2+ equal pulses throughout. He does have mild venous stasis dermatitis changes.  ZJI:[id number] today: Yes - Relatively stable EKG Rate: 53, Rhythm: Sinus Bradycardia ; Non-specific ST-T changes; possible mild left atrial enlargement; normal axis and intervals. PVC   Recent Labs: November  ASSESSMENT / overall stable from a cardiac standpoint. Does have some mild symptoms that are suggestive of venous insufficiency but not significant enough that he would be adjusted in pursuing potential intervention.   CAD S/P percutaneous coronary angioplasty: 08/07/11 cutting balloon PTCA to 95% ostial diag. and 90%  proximal LCX.  prior cutting balloon to RCA  & ostium of ostial diag., 2009  Stable. But maybe class I unstable angina. No active symptoms unless he really pushes it. He is now simply on aspirin for antiplatelet treatment. He is on Imdur, beta blocker and ARB as well as amlodipine for accommodation antihypertensive coverage as well as antianginal treatment. For now would continue current medications. He is only on a very low dose of Toprol.  Essential hypertension Well-controlled. Pretty much on maximal dose of everything except for Toprol. With potential heart rate issues, and not titrate that up now.  Hyperlipemia, intolerant to statins. He actually is taking simvastatin 10 mg and doing okay with it. Lab Results  Component Value Date   CHOL 155 06/01/2014   HDL 35* 06/01/2014   LDLCALC 99 06/01/2014   TRIG 107 06/01/2014   CHOLHDL 4.4 06/01/2014   His LDL is less than 100 which is quite good. Hopefully if he is able to work on dietary modification and weight loss this will improve more. Continue to have low threshold for eliciting the assistance of our pharmacy team for additional advice and treatment options.  Venous stasis of both lower extremities Again by physical exam he does have venous stasis changes. We have not done Doppler evaluation to confirm location and extent of venous  insufficiency. For now he would not want to do invasive procedures. With that in mind I sent the recommended continuing the use of support hose vs. light weight compression stockings. As he really doesn't have heart failure symptoms with a significant amount of edema, I will not put him on diuretic.  DOE (dyspnea on exertion) Probably related to obesity and deconditioning.  He is trying to stay active.  Obesity (BMI 30.0-34.9) He needs to figure out some type of exercise that he can do. He also needs dietary modification.  Insomnia due to stress  And a longer standing physicians caring for him, I said that that activity is light when it comes to his insomnia. He usually takes care of it with low-dose Halcion plus Benadryl. Doesn't need it every night. We discussed different options. He has tried several things. This is usually a primary physicians medicine to prescribed, however he is figuring out his new physician. Now I will provide when necessary triazolam.   Orders Placed This Encounter  Procedures  . EKG 12-Lead    Followup: 6 months with Dr. Ellyn Hack.   Leonie Man, M.D., M.S. Interventional Cardiologist  [place] Pager # [phone] 07/05/2014

## 2014-07-04 NOTE — Patient Instructions (Signed)
Dr Harding recommends that you schedule a follow-up appointment in 6 months. You will receive a reminder letter in the mail two months in advance. If you don't receive a letter, please call our office to schedule the follow-up appointment. 

## 2014-07-05 ENCOUNTER — Encounter: Payer: Self-pay | Admitting: Cardiology

## 2014-07-05 NOTE — Assessment & Plan Note (Signed)
Probably related to obesity and deconditioning. He is trying to stay active.

## 2014-07-05 NOTE — Assessment & Plan Note (Signed)
He actually is taking simvastatin 10 mg and doing okay with it. Lab Results  Component Value Date   CHOL 155 06/01/2014   HDL 35* 06/01/2014   LDLCALC 99 06/01/2014   TRIG 107 06/01/2014   CHOLHDL 4.4 06/01/2014   His LDL is less than 100 which is quite good. Hopefully if he is able to work on dietary modification and weight loss this will improve more. Continue to have low threshold for eliciting the assistance of our pharmacy team for additional advice and treatment options.

## 2014-07-05 NOTE — Assessment & Plan Note (Signed)
Well-controlled. Pretty much on maximal dose of everything except for Toprol. With potential heart rate issues, and not titrate that up now.

## 2014-07-05 NOTE — Assessment & Plan Note (Signed)
Stable. But maybe class I unstable angina. No active symptoms unless he really pushes it. He is now simply on aspirin for antiplatelet treatment. He is on Imdur, beta blocker and ARB as well as amlodipine for accommodation antihypertensive coverage as well as antianginal treatment. For now would continue current medications. He is only on a very low dose of Toprol.

## 2014-07-05 NOTE — Assessment & Plan Note (Signed)
He needs to figure out some type of exercise that he can do. He also needs dietary modification.

## 2014-07-05 NOTE — Assessment & Plan Note (Signed)
   And a longer standing physicians caring for him, I said that that activity is light when it comes to his insomnia. He usually takes care of it with low-dose Halcion plus Benadryl. Doesn't need it every night. We discussed different options. He has tried several things. This is usually a primary physicians medicine to prescribed, however he is figuring out his new physician. Now I will provide when necessary triazolam.

## 2014-07-05 NOTE — Assessment & Plan Note (Signed)
Again by physical exam he does have venous stasis changes. We have not done Doppler evaluation to confirm location and extent of venous insufficiency. For now he would not want to do invasive procedures. With that in mind I sent the recommended continuing the use of support hose vs. light weight compression stockings. As he really doesn't have heart failure symptoms with a significant amount of edema, I will not put him on diuretic.

## 2014-07-07 ENCOUNTER — Encounter (HOSPITAL_COMMUNITY): Payer: Self-pay | Admitting: Cardiology

## 2014-07-16 ENCOUNTER — Other Ambulatory Visit: Payer: Self-pay | Admitting: Cardiology

## 2014-07-18 NOTE — Telephone Encounter (Signed)
Rx(s) sent to pharmacy electronically.  

## 2014-07-27 ENCOUNTER — Encounter (INDEPENDENT_AMBULATORY_CARE_PROVIDER_SITE_OTHER): Payer: Self-pay

## 2014-08-04 ENCOUNTER — Telehealth: Payer: Self-pay | Admitting: Cardiology

## 2014-08-04 NOTE — Telephone Encounter (Signed)
Closed encounter °

## 2014-08-05 ENCOUNTER — Ambulatory Visit: Payer: Medicare Other | Admitting: *Deleted

## 2014-08-05 VITALS — BP 142/70 | HR 51

## 2014-08-05 DIAGNOSIS — Z0130 Encounter for examination of blood pressure without abnormal findings: Secondary | ICD-10-CM

## 2014-08-05 NOTE — Progress Notes (Signed)
Pt seen for no-charge EKG recheck after we were unable to locate copy of last one for him. Dr. Percival Spanish signed, Caren Griffins sent copy of EKG to pt's employer per pt request.

## 2014-09-21 ENCOUNTER — Encounter (HOSPITAL_COMMUNITY): Payer: Self-pay

## 2014-09-21 ENCOUNTER — Encounter (HOSPITAL_COMMUNITY)
Admission: RE | Admit: 2014-09-21 | Discharge: 2014-09-21 | Disposition: A | Discharge status: Home / Self Care | Payer: Medicare Other | Source: Ambulatory Visit | Attending: Family Medicine | Admitting: Family Medicine

## 2014-09-21 DIAGNOSIS — B958 Unspecified staphylococcus as the cause of diseases classified elsewhere: Secondary | ICD-10-CM | POA: Diagnosis present

## 2014-09-21 MED ORDER — HEPARIN SOD (PORK) LOCK FLUSH 100 UNIT/ML IV SOLN
500.0000 [IU] | INTRAVENOUS | Status: DC | PRN
Start: 2014-09-21 — End: 2014-09-22

## 2014-09-21 MED ORDER — HEPARIN SOD (PORK) LOCK FLUSH 100 UNIT/ML IV SOLN
500.0000 [IU] | Freq: Once | INTRAVENOUS | Status: AC
  Administered 2014-09-21: 500 [IU] via INTRAVENOUS

## 2014-09-21 MED ORDER — SODIUM CHLORIDE 0.9 % IJ SOLN
10.0000 mL | INTRAMUSCULAR | Status: AC | PRN
  Administered 2014-09-21: 10 mL
  Filled 2014-09-21: qty 3

## 2014-09-21 MED ORDER — HEPARIN SOD (PORK) LOCK FLUSH 100 UNIT/ML IV SOLN
INTRAVENOUS | Status: AC
  Filled 2014-09-21: qty 5

## 2014-09-21 NOTE — Progress Notes (Signed)
Port a cath accessed under sterile technique and maintanence flush with 500 units performed without difficultly left chest. Tolerated well. Pt verbalizes that he will call later to schedule next flush.

## 2014-10-06 ENCOUNTER — Other Ambulatory Visit (HOSPITAL_COMMUNITY): Payer: Self-pay | Admitting: Urology

## 2014-10-06 DIAGNOSIS — M545 Low back pain, unspecified: Secondary | ICD-10-CM

## 2014-10-13 ENCOUNTER — Ambulatory Visit (HOSPITAL_COMMUNITY)
Admission: RE | Admit: 2014-10-13 | Discharge: 2014-10-13 | Disposition: A | Discharge status: Home / Self Care | Payer: Medicare Other | Source: Ambulatory Visit | Attending: Urology | Admitting: Urology

## 2014-10-13 DIAGNOSIS — M5136 Other intervertebral disc degeneration, lumbar region: Secondary | ICD-10-CM | POA: Insufficient documentation

## 2014-10-13 DIAGNOSIS — M545 Low back pain, unspecified: Secondary | ICD-10-CM

## 2014-10-13 DIAGNOSIS — Z8551 Personal history of malignant neoplasm of bladder: Secondary | ICD-10-CM | POA: Diagnosis not present

## 2014-10-13 DIAGNOSIS — Z85820 Personal history of malignant melanoma of skin: Secondary | ICD-10-CM | POA: Diagnosis not present

## 2014-10-13 LAB — POCT I-STAT CREATININE: Creatinine, Ser: 0.9 mg/dL (ref 0.50–1.35)

## 2014-10-13 MED ORDER — GADOBENATE DIMEGLUMINE 529 MG/ML IV SOLN
20.0000 mL | Freq: Once | INTRAVENOUS | Status: AC | PRN
  Administered 2014-10-13: 20 mL via INTRAVENOUS

## 2014-11-17 ENCOUNTER — Telehealth: Payer: Self-pay | Admitting: Cardiology

## 2014-11-17 MED ORDER — TRIAZOLAM 0.25 MG PO TABS: 0.2500 mg | ORAL_TABLET | Freq: Every evening | ORAL | Status: DC | PRN

## 2014-11-17 NOTE — Telephone Encounter (Signed)
I am not sure if this can be refilled.  If so, I think only 3 month supply.  I refilled this for him with the belief that he was going to get a PCP to take over the prescription.  Can refill this time - 3 month.  [place]

## 2014-11-17 NOTE — Telephone Encounter (Signed)
°  1. Which medications need to be refilled? Halcion 2. Which pharmacy is medication to be sent to?[phone]  3. Do they need a 30 day or 90 day supply? 90 and refills  4. Would they like a call back once the medication has been sent to the pharmacy? no

## 2014-11-17 NOTE — Telephone Encounter (Signed)
Spoke to patient. Inquired about PCP. He states has a visit next month for establishment of care since his previous PCP retired.  Informed that we could do refill 1 time - but for further refills, Dr. Ellyn Hack wishes him to follow up w/ primary. Pt voiced understanding.  I did phone-in refill to his preferred pharmacy for Halcion to continue as directed.

## 2014-11-21 ENCOUNTER — Ambulatory Visit (INDEPENDENT_AMBULATORY_CARE_PROVIDER_SITE_OTHER): Payer: Medicare Other | Admitting: Cardiology

## 2014-11-21 ENCOUNTER — Ambulatory Visit: Payer: Medicare Other | Admitting: Cardiology

## 2014-11-21 VITALS — BP 160/80 | HR 79 | Ht 72.0 in | Wt 264.0 lb

## 2014-11-21 DIAGNOSIS — I2510 Atherosclerotic heart disease of native coronary artery without angina pectoris: Secondary | ICD-10-CM | POA: Diagnosis not present

## 2014-11-21 DIAGNOSIS — Z9861 Coronary angioplasty status: Secondary | ICD-10-CM

## 2014-11-21 DIAGNOSIS — I10 Essential (primary) hypertension: Secondary | ICD-10-CM

## 2014-11-21 DIAGNOSIS — E785 Hyperlipidemia, unspecified: Secondary | ICD-10-CM | POA: Diagnosis not present

## 2014-11-21 DIAGNOSIS — E669 Obesity, unspecified: Secondary | ICD-10-CM

## 2014-11-21 DIAGNOSIS — F5102 Adjustment insomnia: Secondary | ICD-10-CM

## 2014-11-21 MED ORDER — TRIAZOLAM 0.25 MG PO TABS: 0.2500 mg | ORAL_TABLET | Freq: Every evening | ORAL | Status: DC | PRN

## 2014-11-21 NOTE — Progress Notes (Signed)
PATIENT: Arthur Lutz MRN: [id number] DOB: [birth year] PCP: Glo Herring., MD  Clinic Note: Chief Complaint  Patient presents with  . Sleeping Problem    tossing and turning, last night was "pretty bad", 4 MONTH VISIT  . Edema    had bilateral knee replacements, swelling all the time, mostly in legs  . Claudication    says due to knee replacements, legs hurt all the time, pt states no dizziness/SOB/chest pain or pressure    HPI: Arthur Lutz is a 73 y.o. male with a PMH below who presents today for 4 month f/u for CAD & edema. He was a former patient of Dr. Chase Picket, who's CAD history dates back to 2009 had a "mild heart attack". He underwent PCI of the RCA & balloon PTCA to the ostium of the first diagonal branch. In January 2013 he then had PCI of the Circumflex and Cutting Balloon PTCA of the a diagonal branch. These sites were all patent in August 2013 repeat cardiac catheterization. He had a negative Myoview in February 2014 as part of preoperative risk assessment. When I last saw him in April 2015, he had been on some weight, and was really being bothered by his arthritis pains. He is statin intolerant. He has recurrent bladder cancer. He was recently diagnosed with macular degeneration on his right eye and is getting shots.  I last saw him in December 2015.  Interval History: He had a pretty bad night last night with difficulty sleeping. He has a lot of pain in his knees that is really causes pain to the point where he can't sleep. He disconnected comfortable. The only thing that has helped his sleep has been his house Halcion (but he can only get one month supply of time from his PCPs office today for a revisit. I will see her, and he continues to asked me to refill it. He also notes a low mild swelling but really mostly in the knees and doesn't void at the ankles. He has pain in his legs from his knee surgeries but really it doesn't sound like claudication it is not  necessarily exertional or at rest with certain angles. From a cardiac standpoint however he is doing pretty well and relatively stable. He is a little more dyspnea right now with exertion because of sinus congestion otherwise denies any chest tightness pressure or dyspnea at rest or exertion unless he overdoes it.  The remainder of cardiac review of systems is as follows:  positive for - Mild leg swelling and intermittent palpitations but rare. negative for - chest pain, irregular heartbeat, loss of consciousness, murmur, orthopnea, palpitations, paroxysmal nocturnal dyspnea, rapid heart rate, shortness of breath or  syncope/near-syncope, TIA/amaurosis fugax  :    Past Medical History  Diagnosis Date  . History of heart attack 11/2007    "mild; did not affect the heart muscle"; PCI to RCA, Cutting PTCA Diag ostium  . CAD S/P percutaneous coronary angioplasty 08/07/2011    NEW 08/07/11 cutting balloon PTCA to 95% ostial diag. and 90%  proximal LCX.  prior cutting balloon and to RCA  & ostium of ostial diag; Last Cath 02/2012 - Patent Diag PTCA, RCA & Circ BMS stents. Normal EF & EDP; Myoview 2/14 no evidence of ischemia or infarction  . Dyslipidemia   . Hypertension   . Diabetes mellitus type 2, controlled, with complications   . Stroke 1980's    "had facial strokes; 2; about a year apart; never did find  what caused them"  . Melanoma of skin ~ 2005    "level 2; on my back"  . Bladder cancer 11/2005  . Statin intolerance 08/07/2011  . Arthritis of knee, left, needs total Knee in future with Dr. Wynelle Link  08/07/2011  . Portacath in place 08/11/2012  . Aortic valve sclerosis     Cause of murmur  . Osteoarthritis     Status post right knee arthroplasty, positive for staph infection. Pending right arthroplasty  . Allergic rhinitis   . Macular degeneration of right eye     Now getting shots.     Prior Cardiac Evaluation and Past Surgical History: Past Surgical History  Procedure Laterality Date  .  Total knee arthroplasty  11/2003    right. March of 2014. Total of 3.  . Inguinal hernia repair  ~ 1970    left  . Tonsillectomy      "as a child"  . Cataract extraction w/ intraocular lens implant  ~ 2010    right  . Partial knee arthroplasty  06/2003    right  . Knee arthroscopy  02/20/11    left  . Cold cup removal bladder lesion  11/2005    "malignant"  . Total knee arthroplasty Left 09/28/2012    Procedure: TOTAL KNEE ARTHROPLASTY;  Surgeon: Gearlean Alf, MD;  Location: [facility];  Service: Orthopedics;  Laterality: Left;  . Doppler echocardiography  May 2009    Normal EF, impaired relaxation, with sclerotic aortic valve. No stenosis.  Marland Kitchen Nm myoview ltd  Jan. 31, 2014    Low risk, EF 49%, improved from prior. Small apical and apical septal defect, fixed likely artifact no skin or infarction.  . Coronary angioplasty with stent placement  11/2007    Mid RCA - Driver BMS 3.5 mm x 15 mm; 2.0 mm Cutting Balloon PTCA - ostial D1  . Coronary angioplasty with stent placement  08/07/11    Abnormal TM Myoview - for exertional throat discomfort: Patent RCA stent, 90% circumflex -- Vision BMS 3.5 mm x 12 mm --> 4.2 mm. Ostial D2 95% -- 2.25 mm Cutting Balloon PTCA  . Cardiac catheterization  August '13    Widely patent RCA and LCx stents. Also patent PTCA site to D1, ~40-50% D2. Normal EF, normal EDP  . Left heart catheterization with coronary angiogram N/A 08/07/2011    Procedure: LEFT HEART CATHETERIZATION WITH CORONARY ANGIOGRAM;  Surgeon: Leonie Man, MD;  Location: [facility] CATH LAB;  Service: Cardiovascular;  Laterality: N/A;  . Percutaneous coronary stent intervention (pci-s)  08/07/2011    Procedure: PERCUTANEOUS CORONARY STENT INTERVENTION (PCI-S);  Surgeon: Leonie Man, MD;  Location: [facility] CATH LAB;  Service: Cardiovascular;;  . Left heart catheterization with coronary angiogram N/A 03/06/2012    Procedure: LEFT HEART CATHETERIZATION WITH CORONARY ANGIOGRAM;  Surgeon: Leonie Man, MD;   Location: [facility] CATH LAB;  Service: Cardiovascular;  Laterality: N/A;    Allergies  Allergen Reactions  . Statins Other (See Comments)    Myalgias, tired crestor -aches     Current Outpatient Prescriptions  Medication Sig Dispense Refill  . acetaminophen (TYLENOL) 325 MG tablet Take 2 tablets (650 mg total) by mouth every 6 (six) hours as needed. 60 tablet 0  . amLODipine (NORVASC) 10 MG tablet Take 1 tablet (10 mg total) by mouth daily. 90 tablet 2  . aspirin EC 81 MG tablet Take 81 mg by mouth daily.    Marland Kitchen dexlansoprazole (DEXILANT) 60 MG capsule Take 1 capsule (  60 mg total) by mouth daily. 90 capsule 3  . diphenhydrAMINE (BENADRYL) 25 mg capsule Take 25 mg by mouth at bedtime as needed for sleep.    . isosorbide mononitrate (IMDUR) 60 MG 24 hr tablet Take 1 tablet (60 mg total) by mouth daily. 90 tablet 2  . losartan-hydrochlorothiazide (HYZAAR) 100-25 MG per tablet Take 0.5 tablets by mouth daily. 45 tablet 3  . metFORMIN (GLUCOPHAGE) 500 MG tablet Take 500 mg by mouth 2 (two) times daily with a meal.    . metoprolol succinate (TOPROL-XL) 25 MG 24 hr tablet Take 0.5 tablets (12.5 mg total) by mouth daily. 15 tablet 8  . simvastatin (ZOCOR) 10 MG tablet Take 1/2 tablet a day by mouth    . triazolam (HALCION) 0.25 MG tablet Take 1-2 tablets (0.25-0.5 mg total) by mouth at bedtime as needed for sleep. 60 tablet 1   No current facility-administered medications for this visit.    History   Social History Narrative   He is a married father of 2, grandfather of 65. He is not really getting a lot of exercise now. He previously had been working out on a treadmill at least 2 times a day for 15 minutes at a time, but he   is not able to do that as much now because his knee hurts him too bad. Does not smoke and only occasional alcoholic beverage.    family history includes Emphysema in his father; Lung cancer in his paternal uncle.  ROS: A comprehensive Review of Systems - was  performed Review of Systems  Constitutional: Negative for weight loss.  HENT: Negative.   Cardiovascular: Positive for leg swelling (Bilateral venous stasis changes on lower extremities.).  Gastrointestinal: Negative for blood in stool.  Musculoskeletal: Positive for joint pain (knees and hip bilaterally. Also has to wear special shoes that needed replacement.).  Endo/Heme/Allergies: Positive for polydipsia.  Psychiatric/Behavioral: Depression: he is hoping to get off some medications. The patient has insomnia.        Anxious  All other systems reviewed and are negative.  Wt Readings from Last 3 Encounters:  11/21/14 264 lb (119.75 kg)  10/13/14 262 lb (118.842 kg)  07/04/14 262 lb (118.842 kg)   PHYSICAL EXAM BP 160/80 mmHg  Pulse 79  Ht 6' (1.829 m)  Wt 264 lb (119.75 kg)  BMI 35.80 kg/m2 General, he is a pleasant, healthy-appearing gentleman who is mildly to moderately obese; No acute distress, alert and oriented x3, answers questions appropriately.  HEENT: NCAT. EOMI. MMM. Anicteric sclerae.  Neck: Supple. No LAN, JVD, or carotid bruit.  Heart: RRR. Normal S1, S2. He has a 1/6 crescendo-decrescendo SEM heard at the right upper sternal border consistent with aortic sclerosis. No R/G.  Lungs: CTAB. Nonlabored. Normal respiratory effort. Good air movement. No wheezes, rales, rhonchi.  Abdomen: Obese, but otherwise soft, NT, ND. NABS. No HSM.  Extremities: Maybe 1+ lower extremity edema, but 2+ equal pulses throughout. He does have mild venous stasis dermatitis changes.   Adult ECG Report  Rate: 79 ;  Rhythm: normal sinus rhythm, Non-specific ST-T wave abnormality  QRS Axis: - 17 ;  PR Interval: 172 ;  QRS Duration: 94 ; QTc: 442;  Voltages:  grossly normal  Conduction Disturbances: none;    Narrative Interpretation:stable EKG  Recent Labs:  Lab Results  Component Value Date   CHOL 155 06/01/2014   HDL 35* 06/01/2014   LDLCALC 99 06/01/2014   TRIG 107 06/01/2014    CHOLHDL 4.4  06/01/2014     Chemistry      Component Value Date/Time   NA 141 06/01/2014 0710   K 4.3 06/01/2014 0710   CL 102 06/01/2014 0710   CO2 32 06/01/2014 0710   BUN 12 06/01/2014 0710   CREATININE 0.90 10/13/2014 1625   CREATININE 0.93 06/01/2014 0710      Component Value Date/Time   CALCIUM 9.3 06/01/2014 0710   ALKPHOS 76 06/01/2014 0710   AST 19 06/01/2014 0710   ALT 19 06/01/2014 0710   BILITOT 0.6 06/01/2014 0710      ASSESSMENT / PLAN:  Problem List Items Addressed This Visit    CAD S/P percutaneous coronary angioplasty: 08/07/11 cutting balloon PTCA to 95% ostial diag. and 90%  proximal LCX.  prior cutting balloon to RCA  & ostium of ostial diag., 2009  - Primary (Chronic)    He basically has stable class I angina, but has not used any nitroglycerin in a long time. He would like to see if he can come off of something and has asked if he can stop Imdur. I prefer not to fully stop it,been okay with cutting in half initially. He is on a statin which I would like to increase. He is on ARB and beta blocker add very low dose.      Relevant Orders   EKG 12-Lead (Completed)   Essential hypertension (Chronic)    Poorly controlled to date as he is not taking any medications and did not get a good night's sleep so he is quite stressed out. He is on an ARB/HCTZ at a high dose at a low dose of Toprol as his heart rate baseline is also on amlodipine 10 mg. I think we should be okay to cut back Imdur.      Relevant Orders   EKG 12-Lead (Completed)   Hyperlipemia, intolerant to statins. (Chronic)    Unfortunately his LDL is not at goal. I would like to try significant increase to 10 mg daily of simvastatin. Next we will try to use Zetia. Strangely enough, he had cramps with Crestor and not simvastatin We will need to have labs checked in about 3-4 months.      Relevant Orders   EKG 12-Lead (Completed)   Insomnia due to stress (Chronic)    He continues to have issues with  his primary physician for other problems as well, he gets frustrated because he's been on a medication for a longer time and now with a new physician has to always explain things. He has to go back to get refills on monthly basis for medication he has been on for years. Since he really cannot tolerate getting a bad night of sleep as I can tell by his blood pressure and his mood today, probably fine with refilling his triazolam. He asked [place] to let us home left over from last prescription that he misplaced. He certainly is not going to be abusing this medication, therefore I find refilling it but I think this is something that his primary care doctor should do for him with enough to last him at least 3 months.      Relevant Medications   triazolam (HALCION) 0.25 MG tablet   Obesity (BMI 30.0-34.9) (Chronic)    Mostly because his knee he is not able to exercise. I think he needs to try to do some type of exercise with water exercise or bicycle. Also discussed important dietary modification. This will help his lipids as well.  Relevant Orders   EKG 12-Lead (Completed)      Followup: 6 months  DAVID W. Ellyn Hack, M.D., M.S. Interventional Cardiolgy [facility]

## 2014-11-21 NOTE — Patient Instructions (Signed)
May reduce imdur ( isosorbide mn) take 1/2 tablet daily now  Increase simvastatin to 10 mg  ( whole tablet) Take at bedtime  Your physician wants you to follow-up in 6 month  DR HARDING. You will receive a reminder letter in the mail two months in advance. If you don't receive a letter, please call our office to schedule the follow-up appointment.

## 2014-11-23 ENCOUNTER — Encounter: Payer: Self-pay | Admitting: Cardiology

## 2014-11-23 NOTE — Assessment & Plan Note (Signed)
Poorly controlled to date as he is not taking any medications and did not get a good night's sleep so he is quite stressed out. He is on an ARB/HCTZ at a high dose at a low dose of Toprol as his heart rate baseline is also on amlodipine 10 mg. I think we should be okay to cut back Imdur.

## 2014-11-23 NOTE — Assessment & Plan Note (Signed)
Mostly because his knee he is not able to exercise. I think he needs to try to do some type of exercise with water exercise or bicycle. Also discussed important dietary modification. This will help his lipids as well.

## 2014-11-23 NOTE — Assessment & Plan Note (Signed)
He basically has stable class I angina, but has not used any nitroglycerin in a long time. He would like to see if he can come off of something and has asked if he can stop Imdur. I prefer not to fully stop it,been okay with cutting in half initially. He is on a statin which I would like to increase. He is on ARB and beta blocker add very low dose.

## 2014-11-23 NOTE — Assessment & Plan Note (Signed)
He continues to have issues with his primary physician for other problems as well, he gets frustrated because he's been on a medication for a longer time and now with a new physician has to always explain things. He has to go back to get refills on monthly basis for medication he has been on for years. Since he really cannot tolerate getting a bad night of sleep as I can tell by his blood pressure and his mood today, probably fine with refilling his triazolam. He asked [place] to let us home left over from last prescription that he misplaced. He certainly is not going to be abusing this medication, therefore I find refilling it but I think this is something that his primary care doctor should do for him with enough to last him at least 3 months.

## 2014-11-23 NOTE — Assessment & Plan Note (Addendum)
Unfortunately his LDL is not at goal. I would like to try significant increase to 10 mg daily of simvastatin. Next we will try to use Zetia. Strangely enough, he had cramps with Crestor and not simvastatin We will need to have labs checked in about 3-4 months.

## 2014-11-24 ENCOUNTER — Other Ambulatory Visit (INDEPENDENT_AMBULATORY_CARE_PROVIDER_SITE_OTHER): Payer: Self-pay | Admitting: *Deleted

## 2014-11-24 ENCOUNTER — Encounter (INDEPENDENT_AMBULATORY_CARE_PROVIDER_SITE_OTHER): Payer: Self-pay | Admitting: Internal Medicine

## 2014-11-24 ENCOUNTER — Telehealth (INDEPENDENT_AMBULATORY_CARE_PROVIDER_SITE_OTHER): Payer: Self-pay | Admitting: *Deleted

## 2014-11-24 ENCOUNTER — Ambulatory Visit (INDEPENDENT_AMBULATORY_CARE_PROVIDER_SITE_OTHER): Payer: Medicare Other | Admitting: Internal Medicine

## 2014-11-24 VITALS — BP 132/82 | HR 64 | Temp 98.4°F | Ht 72.0 in | Wt 261.5 lb

## 2014-11-24 DIAGNOSIS — Z86010 Personal history of colonic polyps: Secondary | ICD-10-CM | POA: Diagnosis not present

## 2014-11-24 DIAGNOSIS — K5909 Other constipation: Secondary | ICD-10-CM | POA: Diagnosis not present

## 2014-11-24 DIAGNOSIS — K2100 Gastro-esophageal reflux disease with esophagitis, without bleeding: Secondary | ICD-10-CM

## 2014-11-24 DIAGNOSIS — R140 Abdominal distension (gaseous): Secondary | ICD-10-CM

## 2014-11-24 DIAGNOSIS — K219 Gastro-esophageal reflux disease without esophagitis: Secondary | ICD-10-CM

## 2014-11-24 DIAGNOSIS — Z1211 Encounter for screening for malignant neoplasm of colon: Secondary | ICD-10-CM

## 2014-11-24 MED ORDER — DEXLANSOPRAZOLE 60 MG PO CPDR: 60.0000 mg | DELAYED_RELEASE_CAPSULE | Freq: Every day | ORAL | Status: DC

## 2014-11-24 NOTE — Telephone Encounter (Signed)
Patient needs trilyte 

## 2014-11-24 NOTE — Patient Instructions (Signed)
Colonoscopy.  The risks and benefits such as perforation, bleeding, and infection were reviewed with the patient and is agreeable. 

## 2014-11-24 NOTE — Progress Notes (Signed)
Subjective:    Patient ID: Arthur Lutz, male    DOB: [birth year], 74 y.o.   MRN: [id number]  HPI Presents today with c/o that for the lat month or so he has been constipated. He has been using Miralax every day. He tells me he is still constipated and he has seen some blood.  His wife is very concerned. He says his back has been hurting for a couple of months. He has been seen at [facility] for this and underwent a CT back and it was normal. He is having a BM every day but they are hard. There is no change in the size of his stools. Stools are brown in color. Appetite is good. No weight loss. No abdominal pain.  Last colonoscopy in 2011. Biopsy tubular adenoma.    10/09/2009 Colonoscopy: (recurrent bloating for several years). Heme positive stools. A 13mm polyp ablated via cold biopsy from hepatic flexure. Two small diverticula at sigmoid colon. External hemorrhoids.  Biopsy: Tubular adenoma.   Review of Systems Past Medical History  Diagnosis Date  . History of heart attack 11/2007    "mild; did not affect the heart muscle"; PCI to RCA, Cutting PTCA Diag ostium  . CAD S/P percutaneous coronary angioplasty 08/07/2011    NEW 08/07/11 cutting balloon PTCA to 95% ostial diag. and 90%  proximal LCX.  prior cutting balloon and to RCA  & ostium of ostial diag; Last Cath 02/2012 - Patent Diag PTCA, RCA & Circ BMS stents. Normal EF & EDP; Myoview 2/14 no evidence of ischemia or infarction  . Dyslipidemia   . Hypertension   . Diabetes mellitus type 2, controlled, with complications   . 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 Melanoma of skin ~ 2005    "level 2; on my back"  . Bladder cancer 11/2005  . Statin intolerance 08/07/2011  . Arthritis of knee, left, needs total Knee in future with Dr. Wynelle Link  08/07/2011  . Portacath in place 08/11/2012  . Aortic valve sclerosis     Cause of murmur  . Osteoarthritis     Status post right knee  arthroplasty, positive for staph infection. Pending right arthroplasty  . Allergic rhinitis   . Macular degeneration of right eye     Now getting shots.   . Macular degeneration     Past Surgical History  Procedure Laterality Date  . Total knee arthroplasty  11/2003    right. March of 2014. Total of 3.  . Inguinal hernia repair  ~ 1970    left  . Tonsillectomy      "as a child"  . Cataract extraction w/ intraocular lens implant  ~ 2010    right  . Partial knee arthroplasty  06/2003    right  . Knee arthroscopy  02/20/11    left  . Cold cup removal bladder lesion  11/2005    "malignant"  . Total knee arthroplasty Left 09/28/2012    Procedure: TOTAL KNEE ARTHROPLASTY;  Surgeon: Gearlean Alf, MD;  Location: [facility];  Service: Orthopedics;  Laterality: Left;  . Doppler echocardiography  May 2009    Normal EF, impaired relaxation, with sclerotic aortic valve. No stenosis.  Marland Kitchen Nm myoview ltd  Jan. 31, 2014    Low risk, EF 49%, improved from prior. Small apical and apical septal defect, fixed likely artifact no skin or infarction.  . Coronary angioplasty with stent placement  11/2007    Mid RCA - Driver BMS 3.5 mm x 15 mm; 2.0 mm Cutting Balloon PTCA - ostial D1  . Coronary angioplasty with stent placement  08/07/11    Abnormal TM Myoview - for exertional throat discomfort: Patent RCA stent, 90% circumflex -- Vision BMS 3.5 mm x 12 mm --> 4.2 mm. Ostial D2 95% -- 2.25 mm Cutting Balloon PTCA  . Cardiac catheterization  August '13    Widely patent RCA and LCx stents. Also patent PTCA site to D1, ~40-50% D2. Normal EF, normal EDP  . Left heart catheterization with coronary angiogram N/A 08/07/2011    Procedure: LEFT HEART CATHETERIZATION WITH CORONARY ANGIOGRAM;  Surgeon: Leonie Man, MD;  Location: [facility] CATH LAB;  Service: Cardiovascular;  Laterality: N/A;  . Percutaneous coronary stent intervention (pci-s)  08/07/2011    Procedure: PERCUTANEOUS CORONARY STENT INTERVENTION (PCI-S);  Surgeon:  Leonie Man, MD;  Location: [facility] CATH LAB;  Service: Cardiovascular;;  . Left heart catheterization with coronary angiogram N/A 03/06/2012    Procedure: LEFT HEART CATHETERIZATION WITH CORONARY ANGIOGRAM;  Surgeon: Leonie Man, MD;  Location: [facility] CATH LAB;  Service: Cardiovascular;  Laterality: N/A;    Allergies  Allergen Reactions  . Statins Other (See Comments)    Myalgias, tired crestor -aches     Current Outpatient Prescriptions on File Prior to Visit  Medication Sig Dispense Refill  . acetaminophen (TYLENOL) 325 MG tablet Take 2 tablets (650 mg total) by mouth every 6 (six) hours as needed. 60 tablet 0  . amLODipine (NORVASC) 10 MG tablet Take 1 tablet (10 mg total) by mouth daily. 90 tablet 2  . aspirin EC 81 MG tablet Take 81 mg by mouth daily.    Marland Kitchen dexlansoprazole (DEXILANT) 60 MG capsule Take 1 capsule (60 mg total) by mouth daily. 90 capsule 3  . diphenhydrAMINE (BENADRYL) 25 mg capsule Take 25 mg by mouth at bedtime as needed for sleep.    . isosorbide mononitrate (IMDUR) 60 MG 24 hr tablet Take 1 tablet (60 mg total) by mouth daily. 90 tablet 2  . losartan-hydrochlorothiazide (HYZAAR) 100-25 MG per tablet Take 0.5 tablets by mouth daily. 45 tablet 3  . metFORMIN (GLUCOPHAGE) 500 MG tablet Take 500 mg by mouth daily with breakfast.     . metoprolol succinate (TOPROL-XL) 25 MG 24 hr tablet Take 0.5 tablets (12.5 mg total) by mouth daily. 15 tablet 8  . simvastatin (ZOCOR) 10 MG tablet Take 1/2 tablet a day by mouth    . triazolam (HALCION) 0.25 MG tablet Take 1-2 tablets (0.25-0.5 mg total) by mouth at bedtime as needed for sleep. 60 tablet 1   No current facility-administered medications on file prior to visit.        Objective:   Physical Exam Blood pressure 132/82, pulse 64, temperature 98.4 F (36.9 C), height 6' (1.829 m), weight 261 lb 8 oz (118.616 kg). Past Medical History  Diagnosis Date  . History of heart attack 11/2007    "mild; did not affect the heart  muscle"; PCI to RCA, Cutting PTCA Diag ostium  . CAD S/P percutaneous coronary angioplasty 08/07/2011    NEW 08/07/11 cutting balloon PTCA to 95% ostial diag. and 90%  proximal LCX.  prior cutting balloon and to RCA  & ostium of ostial diag; Last Cath 02/2012 - Patent Diag PTCA, RCA & Circ BMS stents. Normal EF & EDP; Myoview 2/14 no evidence of ischemia or infarction  . Dyslipidemia   . Hypertension   .  Diabetes mellitus type 2, controlled, with complications   . 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 Melanoma of skin ~ 2005    "level 2; on my back"  . Bladder cancer 11/2005  . Statin intolerance 08/07/2011  . Arthritis of knee, left, needs total Knee in future with Dr. Wynelle Link  08/07/2011  . Portacath in place 08/11/2012  . Aortic valve sclerosis     Cause of murmur  . Osteoarthritis     Status post right knee arthroplasty, positive for staph infection. Pending right arthroplasty  . Allergic rhinitis   . Macular degeneration of right eye     Now getting shots.   . Macular degeneration     Past Surgical History  Procedure Laterality Date  . Total knee arthroplasty  11/2003    right. March of 2014. Total of 3.  . Inguinal hernia repair  ~ 1970    left  . Tonsillectomy      "as a child"  . Cataract extraction w/ intraocular lens implant  ~ 2010    right  . Partial knee arthroplasty  06/2003    right  . Knee arthroscopy  02/20/11    left  . Cold cup removal bladder lesion  11/2005    "malignant"  . Total knee arthroplasty Left 09/28/2012    Procedure: TOTAL KNEE ARTHROPLASTY;  Surgeon: Gearlean Alf, MD;  Location: [facility];  Service: Orthopedics;  Laterality: Left;  . Doppler echocardiography  May 2009    Normal EF, impaired relaxation, with sclerotic aortic valve. No stenosis.  Marland Kitchen Nm myoview ltd  Jan. 31, 2014    Low risk, EF 49%, improved from prior. Small apical and apical septal defect, fixed likely artifact no skin or  infarction.  . Coronary angioplasty with stent placement  11/2007    Mid RCA - Driver BMS 3.5 mm x 15 mm; 2.0 mm Cutting Balloon PTCA - ostial D1  . Coronary angioplasty with stent placement  08/07/11    Abnormal TM Myoview - for exertional throat discomfort: Patent RCA stent, 90% circumflex -- Vision BMS 3.5 mm x 12 mm --> 4.2 mm. Ostial D2 95% -- 2.25 mm Cutting Balloon PTCA  . Cardiac catheterization  August '13    Widely patent RCA and LCx stents. Also patent PTCA site to D1, ~40-50% D2. Normal EF, normal EDP  . Left heart catheterization with coronary angiogram N/A 08/07/2011    Procedure: LEFT HEART CATHETERIZATION WITH CORONARY ANGIOGRAM;  Surgeon: Leonie Man, MD;  Location: [facility] CATH LAB;  Service: Cardiovascular;  Laterality: N/A;  . Percutaneous coronary stent intervention (pci-s)  08/07/2011    Procedure: PERCUTANEOUS CORONARY STENT INTERVENTION (PCI-S);  Surgeon: Leonie Man, MD;  Location: [facility] CATH LAB;  Service: Cardiovascular;;  . Left heart catheterization with coronary angiogram N/A 03/06/2012    Procedure: LEFT HEART CATHETERIZATION WITH CORONARY ANGIOGRAM;  Surgeon: Leonie Man, MD;  Location: [facility] CATH LAB;  Service: Cardiovascular;  Laterality: N/A;    Allergies  Allergen Reactions  . Statins Other (See Comments)    Myalgias, tired crestor -aches     Current Outpatient Prescriptions on File Prior to Visit  Medication Sig Dispense Refill  . acetaminophen (TYLENOL) 325 MG tablet Take 2 tablets (650 mg total) by mouth every 6 (six) hours as needed. 60 tablet 0  . amLODipine (NORVASC) 10 MG tablet Take 1 tablet (10 mg total) by mouth daily.  90 tablet 2  . aspirin EC 81 MG tablet Take 81 mg by mouth daily.    Marland Kitchen dexlansoprazole (DEXILANT) 60 MG capsule Take 1 capsule (60 mg total) by mouth daily. 90 capsule 3  . diphenhydrAMINE (BENADRYL) 25 mg capsule Take 25 mg by mouth at bedtime as needed for sleep.    . isosorbide mononitrate (IMDUR) 60 MG 24 hr tablet Take 1 tablet  (60 mg total) by mouth daily. 90 tablet 2  . losartan-hydrochlorothiazide (HYZAAR) 100-25 MG per tablet Take 0.5 tablets by mouth daily. 45 tablet 3  . metFORMIN (GLUCOPHAGE) 500 MG tablet Take 500 mg by mouth daily with breakfast.     . metoprolol succinate (TOPROL-XL) 25 MG 24 hr tablet Take 0.5 tablets (12.5 mg total) by mouth daily. 15 tablet 8  . simvastatin (ZOCOR) 10 MG tablet Take 1/2 tablet a day by mouth    . triazolam (HALCION) 0.25 MG tablet Take 1-2 tablets (0.25-0.5 mg total) by mouth at bedtime as needed for sleep. 60 tablet 1   No current facility-administered medications on file prior to visit.    Alert and oriented. Skin warm and dry. Oral mucosa is moist.   . Sclera anicteric, conjunctivae is pink. Thyroid not enlarged. No cervical lymphadenopathy. Lungs clear. Heart regular rate and rhythm.  Abdomen is soft. Bowel sounds are positive. No hepatomegaly. No abdominal masses felt. Slight left lower abdominal tenderness on palpatation.  No edema to lower extremities.          Assessment & Plan:  Constipation: new onset. Change in his stool. Hx of colonic polyp. Needs colonoscopy. Linzess 169mcg daily (samples x 4 boxes).  Refill on Dexilant

## 2014-11-25 MED ORDER — PEG 3350-KCL-NA BICARB-NACL 420 G PO SOLR: 4000.0000 mL | Freq: Once | ORAL | Status: DC

## 2014-11-26 ENCOUNTER — Other Ambulatory Visit: Payer: Self-pay | Admitting: Cardiology

## 2014-11-28 NOTE — Telephone Encounter (Signed)
Rx refill sent to patient pharmacy   

## 2014-12-05 ENCOUNTER — Ambulatory Visit: Payer: Medicare Other | Admitting: Cardiology

## 2014-12-09 ENCOUNTER — Encounter (HOSPITAL_COMMUNITY)
Admission: RE | Disposition: A | Discharge status: Home / Self Care | Payer: Self-pay | Source: Ambulatory Visit | Attending: Internal Medicine

## 2014-12-09 ENCOUNTER — Encounter (HOSPITAL_COMMUNITY): Payer: Self-pay | Admitting: *Deleted

## 2014-12-09 ENCOUNTER — Ambulatory Visit (HOSPITAL_COMMUNITY)
Admission: RE | Admit: 2014-12-09 | Discharge: 2014-12-09 | Disposition: A | Discharge status: Home / Self Care | Payer: Medicare Other | Source: Ambulatory Visit | Attending: Internal Medicine | Admitting: Internal Medicine

## 2014-12-09 DIAGNOSIS — Z85820 Personal history of malignant melanoma of skin: Secondary | ICD-10-CM | POA: Diagnosis not present

## 2014-12-09 DIAGNOSIS — K635 Polyp of colon: Secondary | ICD-10-CM | POA: Diagnosis not present

## 2014-12-09 DIAGNOSIS — Z955 Presence of coronary angioplasty implant and graft: Secondary | ICD-10-CM | POA: Diagnosis not present

## 2014-12-09 DIAGNOSIS — Z87891 Personal history of nicotine dependence: Secondary | ICD-10-CM | POA: Diagnosis not present

## 2014-12-09 DIAGNOSIS — I38 Endocarditis, valve unspecified: Secondary | ICD-10-CM | POA: Insufficient documentation

## 2014-12-09 DIAGNOSIS — E785 Hyperlipidemia, unspecified: Secondary | ICD-10-CM | POA: Diagnosis not present

## 2014-12-09 DIAGNOSIS — Z8551 Personal history of malignant neoplasm of bladder: Secondary | ICD-10-CM | POA: Diagnosis not present

## 2014-12-09 DIAGNOSIS — D125 Benign neoplasm of sigmoid colon: Secondary | ICD-10-CM | POA: Diagnosis not present

## 2014-12-09 DIAGNOSIS — I10 Essential (primary) hypertension: Secondary | ICD-10-CM | POA: Diagnosis not present

## 2014-12-09 DIAGNOSIS — E119 Type 2 diabetes mellitus without complications: Secondary | ICD-10-CM | POA: Insufficient documentation

## 2014-12-09 DIAGNOSIS — K5900 Constipation, unspecified: Secondary | ICD-10-CM | POA: Diagnosis present

## 2014-12-09 DIAGNOSIS — K5730 Diverticulosis of large intestine without perforation or abscess without bleeding: Secondary | ICD-10-CM | POA: Diagnosis not present

## 2014-12-09 DIAGNOSIS — Z96651 Presence of right artificial knee joint: Secondary | ICD-10-CM | POA: Diagnosis not present

## 2014-12-09 DIAGNOSIS — Z7982 Long term (current) use of aspirin: Secondary | ICD-10-CM | POA: Insufficient documentation

## 2014-12-09 DIAGNOSIS — M1711 Unilateral primary osteoarthritis, right knee: Secondary | ICD-10-CM | POA: Diagnosis not present

## 2014-12-09 DIAGNOSIS — I252 Old myocardial infarction: Secondary | ICD-10-CM | POA: Diagnosis not present

## 2014-12-09 DIAGNOSIS — Z8673 Personal history of transient ischemic attack (TIA), and cerebral infarction without residual deficits: Secondary | ICD-10-CM | POA: Diagnosis not present

## 2014-12-09 DIAGNOSIS — Z86010 Personal history of colonic polyps: Secondary | ICD-10-CM

## 2014-12-09 DIAGNOSIS — I2510 Atherosclerotic heart disease of native coronary artery without angina pectoris: Secondary | ICD-10-CM | POA: Diagnosis not present

## 2014-12-09 DIAGNOSIS — K649 Unspecified hemorrhoids: Secondary | ICD-10-CM | POA: Diagnosis not present

## 2014-12-09 DIAGNOSIS — Z888 Allergy status to other drugs, medicaments and biological substances status: Secondary | ICD-10-CM | POA: Diagnosis not present

## 2014-12-09 DIAGNOSIS — D123 Benign neoplasm of transverse colon: Secondary | ICD-10-CM | POA: Diagnosis not present

## 2014-12-09 DIAGNOSIS — K5909 Other constipation: Secondary | ICD-10-CM

## 2014-12-09 HISTORY — DX: Acute myocardial infarction, unspecified: I21.9

## 2014-12-09 HISTORY — PX: COLONOSCOPY: SHX5424

## 2014-12-09 LAB — GLUCOSE, CAPILLARY: Glucose-Capillary: 125 mg/dL — ABNORMAL HIGH (ref 65–99)

## 2014-12-09 SURGERY — COLONOSCOPY
Anesthesia: Moderate Sedation

## 2014-12-09 MED ORDER — MEPERIDINE HCL 50 MG/ML IJ SOLN
INTRAMUSCULAR | Status: DC | PRN
  Administered 2014-12-09 (×2): 25 mg via INTRAVENOUS
  Administered 2014-12-09: 25 mg

## 2014-12-09 MED ORDER — LINACLOTIDE 145 MCG PO CAPS: 145.0000 ug | ORAL_CAPSULE | Freq: Every day | ORAL | Status: DC

## 2014-12-09 MED ORDER — SODIUM CHLORIDE 0.9 % IV SOLN
INTRAVENOUS | Status: DC
  Administered 2014-12-09: 08:00:00 via INTRAVENOUS

## 2014-12-09 MED ORDER — MIDAZOLAM HCL 5 MG/5ML IJ SOLN
INTRAMUSCULAR | Status: AC
  Filled 2014-12-09: qty 10

## 2014-12-09 MED ORDER — MEPERIDINE HCL 50 MG/ML IJ SOLN
INTRAMUSCULAR | Status: AC
  Filled 2014-12-09: qty 1

## 2014-12-09 MED ORDER — STERILE WATER FOR IRRIGATION IR SOLN
Status: DC | PRN
  Administered 2014-12-09: 09:00:00

## 2014-12-09 MED ORDER — MIDAZOLAM HCL 5 MG/5ML IJ SOLN
INTRAMUSCULAR | Status: DC | PRN
  Administered 2014-12-09 (×5): 2 mg via INTRAVENOUS

## 2014-12-09 NOTE — Op Note (Signed)
COLONOSCOPY PROCEDURE REPORT  PATIENT:  Arthur Lutz  MR#:  [id number] Birthdate:  1941/06/02, 74 y.o., male Endoscopist:  Dr. Rogene Houston, MD Referred By:  Dr. Glo Herring, MD  Procedure Date: 12/09/2014  Procedure:   Colonoscopy  Indications:  Patient is 74 year old Caucasian male was history of colonic adenomas and is here for surveillance colonoscopy. He also has constipation and has responded to Linzess.  Informed Consent:  The procedure and risks were reviewed with the patient and informed consent was obtained.  Medications:  Demerol 75 mg IV Versed 10 mg IV  Description of procedure:  After a digital rectal exam was performed, that colonoscope was advanced from the anus through the rectum and colon to the area of the cecum, ileocecal valve and appendiceal orifice. The cecum was deeply intubated. These structures were well-seen and photographed for the record. From the level of the cecum and ileocecal valve, the scope was slowly and cautiously withdrawn. The mucosal surfaces were carefully surveyed utilizing scope tip to flexion to facilitate fold flattening as needed. The scope was pulled down into the rectum where a thorough exam including retroflexion was performed.  Findings:   Prep excellent. 8 mm broad-based polyp at distal transverse colon. This polyp was hot snared but during this process was coagulated. At was biopsied for histology. 4 small polyps removed from distal sigmoid colon two while cold biopsy and two were old snared. These polyps were submitted together. Few small diverticula at sigmoid colon. Normal rectal mucosa. Small hemorrhoids above the dentate line.   Therapeutic/Diagnostic Maneuvers Performed:  See above  Complications:  None  Cecal Withdrawal Time:  17 minutes  Impression:  Examination performed to cecum. 62mm broad-based polyp hot snared from distal transverse colon. This polyp was coagulated during this process and small pieces  obtained for histology. Four small polyps removed from distal sigmoid colon and submitted together. Two were removed with cold biopsy and two were cold snared. Mild sigmoid colon diverticulosis. Internal hemorrhoids.  Recommendations:  Standard instructions given. High fiber diet. Prescription for Linzess 145 mg by mouth daily given I will contact patient with biopsy results and further recommendations.  Haze Antillon U  12/09/2014 9:29 AM  CC: Dr. Glo Herring., MD & Dr. Rayne Du ref. provider found

## 2014-12-09 NOTE — H&P (Signed)
Arthur Lutz is an 74 y.o. male.   Chief Complaint: Patient is here for colonoscopy. HPI: Asian is 74 year old Caucasian male with multiple medical problems was history of colonic adenomas and is here for surveillance colonoscopy. Last exam was in March 2011. He gives history of constipation and needs responded to [place]. He has hematochezia usually in the form of blood on tissue when he is constipated. He has good appetite and has not lost any weight. Family history is negative for CRC.  Past Medical History  Diagnosis Date  . History of heart attack 11/2007    "mild; did not affect the heart muscle"; PCI to RCA, Cutting PTCA Diag ostium  . CAD S/P percutaneous coronary angioplasty 08/07/2011    NEW 08/07/11 cutting balloon PTCA to 95% ostial diag. and 90%  proximal LCX.  prior cutting balloon and to RCA  & ostium of ostial diag; Last Cath 02/2012 - Patent Diag PTCA, RCA & Circ BMS stents. Normal EF & EDP; Myoview 2/14 no evidence of ischemia or infarction  . Dyslipidemia   . Hypertension   . Diabetes mellitus type 2, controlled, with complications   . 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 Melanoma of skin ~ 2005    "level 2; on my back"  . Bladder cancer 11/2005  . Statin intolerance 08/07/2011  . Arthritis of knee, left, needs total Knee in future with Dr. Wynelle Link  08/07/2011  . Portacath in place 08/11/2012  . Aortic valve sclerosis     Cause of murmur  . Osteoarthritis     Status post right knee arthroplasty, positive for staph infection. Pending right arthroplasty  . Allergic rhinitis   . Macular degeneration of right eye     Now getting shots.   . Macular degeneration   . Diabetes mellitus without complication   . Myocardial infarction 2012    Past Surgical History  Procedure Laterality Date  . Total knee arthroplasty  11/2003    right. March of 2014. Total of 3.  . Inguinal hernia repair  ~ 1970    left  . Tonsillectomy       "as a child"  . Cataract extraction w/ intraocular lens implant  ~ 2010    right  . Partial knee arthroplasty  06/2003    right  . Knee arthroscopy  02/20/11    left  . Cold cup removal bladder lesion  11/2005    "malignant"  . Total knee arthroplasty Left 09/28/2012    Procedure: TOTAL KNEE ARTHROPLASTY;  Surgeon: Gearlean Alf, MD;  Location: [facility];  Service: Orthopedics;  Laterality: Left;  . Doppler echocardiography  May 2009    Normal EF, impaired relaxation, with sclerotic aortic valve. No stenosis.  Marland Kitchen Nm myoview ltd  Jan. 31, 2014    Low risk, EF 49%, improved from prior. Small apical and apical septal defect, fixed likely artifact no skin or infarction.  . Coronary angioplasty with stent placement  11/2007    Mid RCA - Driver BMS 3.5 mm x 15 mm; 2.0 mm Cutting Balloon PTCA - ostial D1  . Coronary angioplasty with stent placement  08/07/11    Abnormal TM Myoview - for exertional throat discomfort: Patent RCA stent, 90% circumflex -- Vision BMS 3.5 mm x 12 mm --> 4.2 mm. Ostial D2 95% -- 2.25 mm Cutting Balloon PTCA  . Cardiac catheterization  August '13    Widely  patent RCA and LCx stents. Also patent PTCA site to D1, ~40-50% D2. Normal EF, normal EDP  . Left heart catheterization with coronary angiogram N/A 08/07/2011    Procedure: LEFT HEART CATHETERIZATION WITH CORONARY ANGIOGRAM;  Surgeon: Leonie Man, MD;  Location: [facility] CATH LAB;  Service: Cardiovascular;  Laterality: N/A;  . Percutaneous coronary stent intervention (pci-s)  08/07/2011    Procedure: PERCUTANEOUS CORONARY STENT INTERVENTION (PCI-S);  Surgeon: Leonie Man, MD;  Location: [facility] CATH LAB;  Service: Cardiovascular;;  . Left heart catheterization with coronary angiogram N/A 03/06/2012    Procedure: LEFT HEART CATHETERIZATION WITH CORONARY ANGIOGRAM;  Surgeon: Leonie Man, MD;  Location: [facility] CATH LAB;  Service: Cardiovascular;  Laterality: N/A;    Family History  Problem Relation Age of Onset  . Emphysema Father    . Lung cancer Paternal Uncle     x 2 smokers  . Colon cancer Neg Hx    Social History:  reports that he quit smoking about 34 years ago. His smoking use included Cigarettes. He has a 50 pack-year smoking history. He has never used smokeless tobacco. He reports that he drinks about 1.1 oz of alcohol per week. He reports that he does not use illicit drugs.  Allergies:  Allergies  Allergen Reactions  . Statins Other (See Comments)    Myalgias, tired crestor -aches     Medications Prior to Admission  Medication Sig Dispense Refill  . acetaminophen (TYLENOL) 325 MG tablet Take 2 tablets (650 mg total) by mouth every 6 (six) hours as needed. 60 tablet 0  . amLODipine (NORVASC) 10 MG tablet Take 1 tablet (10 mg total) by mouth daily. 90 tablet 2  . dexlansoprazole (DEXILANT) 60 MG capsule Take 1 capsule (60 mg total) by mouth daily. 90 capsule 3  . diphenhydrAMINE (BENADRYL) 25 mg capsule Take 25 mg by mouth at bedtime as needed for sleep.    . isosorbide mononitrate (IMDUR) 60 MG 24 hr tablet Take 1 tablet (60 mg total) by mouth daily. 90 tablet 2  . losartan-hydrochlorothiazide (HYZAAR) 100-25 MG per tablet Take 0.5 tablets by mouth daily. 45 tablet 3  . metoprolol succinate (TOPROL-XL) 25 MG 24 hr tablet TAKE 1/2 TABLET BY MOUTH EVERY DAY 15 tablet 11  . Multiple Vitamins-Minerals (PRESERVISION AREDS PO) Take by mouth.    . polyethylene glycol-electrolytes (NULYTELY/GOLYTELY) 420 G solution Take 4,000 mLs by mouth once. 4000 mL 0  . simvastatin (ZOCOR) 10 MG tablet Take 1/2 tablet a day by mouth    . tamsulosin (FLOMAX) 0.4 MG CAPS capsule Take 0.4 mg by mouth as needed.    . triazolam (HALCION) 0.25 MG tablet Take 1-2 tablets (0.25-0.5 mg total) by mouth at bedtime as needed for sleep. 60 tablet 1  . aspirin EC 81 MG tablet Take 81 mg by mouth daily.    . metFORMIN (GLUCOPHAGE) 500 MG tablet Take 500 mg by mouth daily with breakfast.       Results for orders placed or performed during  the hospital encounter of 12/09/14 (from the past 48 hour(s))  Glucose, capillary     Status: Abnormal   Collection Time: 12/09/14  8:00 AM  Result Value Ref Range   Glucose-Capillary 125 (H) 65 - 99 mg/dL   No results found.  ROS  Blood pressure 165/98, pulse 81, temperature 98.2 F (36.8 C), temperature source Oral, resp. rate 16, height 6' (1.829 m), weight 261 lb (118.389 kg), SpO2 97 %. Physical Exam  Constitutional:  Well-developed  obese Caucasian male in NAD.  HENT:  Mouth/Throat: Oropharynx is clear and moist.  Eyes: Conjunctivae are normal. No scleral icterus.  Neck: No thyromegaly present.  Cardiovascular: Normal rate, regular rhythm and normal heart sounds.   No murmur heard. Respiratory: Effort normal and breath sounds normal.  Port-A-Cath in left shoulder region  GI: Soft. He exhibits no distension and no mass. There is no tenderness.  Musculoskeletal: He exhibits no edema.  Lymphadenopathy:    He has no cervical adenopathy.  Neurological: He is alert.  Skin: Skin is warm and dry.     Assessment/Plan History of colonic adenomas. Hematochezia been constipated. Surveillance colonoscopy.  Malakhi Markwood U 12/09/2014, 8:25 AM

## 2014-12-09 NOTE — Discharge Instructions (Signed)
No aspirin or NSAIDs for 1 week. Resume other medications as before. Linzess 145 mcg by mouth every morning. High fiber diet. No driving for 24 hours. Physician will call with biopsy results.   Colon Polyps Polyps are lumps of extra tissue growing inside the body. Polyps can grow in the large intestine (colon). Most colon polyps are noncancerous (benign). However, some colon polyps can become cancerous over time. Polyps that are larger than a pea may be harmful. To be safe, caregivers remove and test all polyps. CAUSES  Polyps form when mutations in the genes cause your cells to grow and divide even though no more tissue is needed. RISK FACTORS There are a number of risk factors that can increase your chances of getting colon polyps. They include:  Being older than 50 years.  Family history of colon polyps or colon cancer.  Long-term colon diseases, such as colitis or Crohn disease.  Being overweight.  Smoking.  Being inactive.  Drinking too much alcohol. SYMPTOMS  Most small polyps do not cause symptoms. If symptoms are present, they may include:  Blood in the stool. The stool may look dark red or black.  Constipation or diarrhea that lasts longer than 1 week. DIAGNOSIS People often do not know they have polyps until their caregiver finds them during a regular checkup. Your caregiver can use 4 tests to check for polyps:  Digital rectal exam. The caregiver wears gloves and feels inside the rectum. This test would find polyps only in the rectum.  Barium enema. The caregiver puts a liquid called barium into your rectum before taking X-rays of your colon. Barium makes your colon look white. Polyps are dark, so they are easy to see in the X-ray pictures.  Sigmoidoscopy. A thin, flexible tube (sigmoidoscope) is placed into your rectum. The sigmoidoscope has a light and tiny camera in it. The caregiver uses the sigmoidoscope to look at the last third of your colon.  Colonoscopy.  This test is like sigmoidoscopy, but the caregiver looks at the entire colon. This is the most common method for finding and removing polyps. TREATMENT  Any polyps will be removed during a sigmoidoscopy or colonoscopy. The polyps are then tested for cancer. PREVENTION  To help lower your risk of getting more colon polyps:  Eat plenty of fruits and vegetables. Avoid eating fatty foods.  Do not smoke.  Avoid drinking alcohol.  Exercise every day.  Lose weight if recommended by your caregiver.  Eat plenty of calcium and folate. Foods that are rich in calcium include milk, cheese, and broccoli. Foods that are rich in folate include chickpeas, kidney beans, and spinach. HOME CARE INSTRUCTIONS Keep all follow-up appointments as directed by your caregiver. You may need periodic exams to check for polyps. SEEK MEDICAL CARE IF: You notice bleeding during a bowel movement. Document Released: 04/10/2004 Document Revised: 10/07/2011 Document Reviewed: 09/24/2011 [facility] Patient Information 2015 [place], [place]. This information is not intended to replace advice given to you by your health care provider. Make sure you discuss any questions you have with your health care provider. High-Fiber Diet Fiber is found in fruits, vegetables, and grains. A high-fiber diet encourages the addition of more whole grains, legumes, fruits, and vegetables in your diet. The recommended amount of fiber for adult males is 38 g per day. For adult females, it is 25 g per day. Pregnant and lactating women should get 28 g of fiber per day. If you have a digestive or bowel problem, ask your caregiver  for advice before adding high-fiber foods to your diet. Eat a variety of high-fiber foods instead of only a select few type of foods.  PURPOSE  To increase stool bulk.  To make bowel movements more regular to prevent constipation.  To lower cholesterol.  To prevent overeating. WHEN IS THIS DIET USED?  It may be used if  you have constipation and hemorrhoids.  It may be used if you have uncomplicated diverticulosis (intestine condition) and irritable bowel syndrome.  It may be used if you need help with weight management.  It may be used if you want to add it to your diet as a protective measure against atherosclerosis, diabetes, and cancer. SOURCES OF FIBER  Whole-grain breads and cereals.  Fruits, such as apples, oranges, bananas, berries, prunes, and pears.  Vegetables, such as green peas, carrots, sweet potatoes, beets, broccoli, cabbage, spinach, and artichokes.  Legumes, such split peas, soy, lentils.  Almonds. FIBER CONTENT IN FOODS Starches and Grains / Dietary Fiber (g)  Cheerios, 1 cup / 3 g  Corn Flakes cereal, 1 cup / 0.7 g  Rice crispy treat cereal, 1 cup / 0.3 g  Instant oatmeal (cooked),  cup / 2 g  Frosted wheat cereal, 1 cup / 5.1 g  Brown, long-grain rice (cooked), 1 cup / 3.5 g  White, long-grain rice (cooked), 1 cup / 0.6 g  Enriched macaroni (cooked), 1 cup / 2.5 g Legumes / Dietary Fiber (g)  Baked beans (canned, plain, or vegetarian),  cup / 5.2 g  Kidney beans (canned),  cup / 6.8 g  Pinto beans (cooked),  cup / 5.5 g Breads and Crackers / Dietary Fiber (g)  Plain or honey graham crackers, 2 squares / 0.7 g  Saltine crackers, 3 squares / 0.3 g  Plain, salted pretzels, 10 pieces / 1.8 g  Whole-wheat bread, 1 slice / 1.9 g  White bread, 1 slice / 0.7 g  Raisin bread, 1 slice / 1.2 g  Plain bagel, 3 oz / 2 g  Flour tortilla, 1 oz / 0.9 g  Corn tortilla, 1 small / 1.5 g  Hamburger or hotdog bun, 1 small / 0.9 g Fruits / Dietary Fiber (g)  Apple with skin, 1 medium / 4.4 g  Sweetened applesauce,  cup / 1.5 g  Banana,  medium / 1.5 g  Grapes, 10 grapes / 0.4 g  Orange, 1 small / 2.3 g  Raisin, 1.5 oz / 1.6 g  Melon, 1 cup / 1.4 g Vegetables / Dietary Fiber (g)  Green beans (canned),  cup / 1.3 g  Carrots (cooked),  cup / 2.3  g  Broccoli (cooked),  cup / 2.8 g  Peas (cooked),  cup / 4.4 g  Mashed potatoes,  cup / 1.6 g  Lettuce, 1 cup / 0.5 g  Corn (canned),  cup / 1.6 g  Tomato,  cup / 1.1 g Document Released: 07/15/2005 Document Revised: 01/14/2012 Document Reviewed: 10/17/2011 [employer] Patient Information 2015 [place], [place]. This information is not intended to replace advice given to you by your health care provider. Make sure you discuss any questions you have with your health care provider. Colonoscopy, Care After Refer to this sheet in the next few weeks. These instructions provide you with information on caring for yourself after your procedure. Your health care provider may also give you more specific instructions. Your treatment has been planned according to current medical practices, but problems sometimes occur. Call your health care provider if you have  any problems or questions after your procedure. WHAT TO EXPECT AFTER THE PROCEDURE  After your procedure, it is typical to have the following:  A small amount of blood in your stool.  Moderate amounts of gas and mild abdominal cramping or bloating. HOME CARE INSTRUCTIONS  Do not drive, operate machinery, or sign important documents for 24 hours.  You may shower and resume your regular physical activities, but move at a slower pace for the first 24 hours.  Take frequent rest periods for the first 24 hours.  Walk around or put a warm pack on your abdomen to help reduce abdominal cramping and bloating.  Drink enough fluids to keep your urine clear or pale yellow.  You may resume your normal diet as instructed by your health care provider. Avoid heavy or fried foods that are hard to digest.  Avoid drinking alcohol for 24 hours or as instructed by your health care provider.  Only take over-the-counter or prescription medicines as directed by your health care provider.  If a tissue sample (biopsy) was taken during your procedure:  Do  not take aspirin or blood thinners for 7 days, or as instructed by your health care provider.  Do not drink alcohol for 7 days, or as instructed by your health care provider.  Eat soft foods for the first 24 hours. SEEK MEDICAL CARE IF: You have persistent spotting of blood in your stool 2-3 days after the procedure. SEEK IMMEDIATE MEDICAL CARE IF:  You have more than a small spotting of blood in your stool.  You pass large blood clots in your stool.  Your abdomen is swollen (distended).  You have nausea or vomiting.  You have a fever.  You have increasing abdominal pain that is not relieved with medicine. Document Released: 02/27/2004 Document Revised: 05/05/2013 Document Reviewed: 03/22/2013 [facility] Patient Information 2015 [place], [place]. This information is not intended to replace advice given to you by your health care provider. Make sure you discuss any questions you have with your health care provider.

## 2014-12-13 ENCOUNTER — Telehealth (INDEPENDENT_AMBULATORY_CARE_PROVIDER_SITE_OTHER): Payer: Self-pay | Admitting: *Deleted

## 2014-12-13 ENCOUNTER — Encounter (HOSPITAL_COMMUNITY): Payer: Self-pay | Admitting: Internal Medicine

## 2014-12-13 NOTE — Telephone Encounter (Signed)
Arthur Lutz would like to know if we have any samples of Linzess while he waits on the Authorization. His return phone number is [phone].

## 2014-12-14 NOTE — Telephone Encounter (Signed)
Samples are ready for the patient. Patient called made.

## 2014-12-14 NOTE — Telephone Encounter (Signed)
A message was left on voicemail , samples are ready.

## 2014-12-19 ENCOUNTER — Encounter (INDEPENDENT_AMBULATORY_CARE_PROVIDER_SITE_OTHER): Payer: Self-pay | Admitting: *Deleted

## 2015-02-10 ENCOUNTER — Encounter (HOSPITAL_COMMUNITY): Payer: Self-pay

## 2015-02-10 ENCOUNTER — Encounter (HOSPITAL_COMMUNITY)
Admission: RE | Admit: 2015-02-10 | Discharge: 2015-02-10 | Disposition: A | Discharge status: Home / Self Care | Payer: Medicare Other | Source: Ambulatory Visit | Attending: Family Medicine | Admitting: Family Medicine

## 2015-02-10 DIAGNOSIS — Z452 Encounter for adjustment and management of vascular access device: Secondary | ICD-10-CM | POA: Insufficient documentation

## 2015-02-10 MED ORDER — SODIUM CHLORIDE 0.9 % IJ SOLN
10.0000 mL | INTRAMUSCULAR | Status: AC | PRN
  Administered 2015-02-10: 10 mL

## 2015-02-10 MED ORDER — SODIUM CHLORIDE 0.9 % IJ SOLN
10.0000 mL | INTRAMUSCULAR | Status: DC | PRN
  Filled 2015-02-10: qty 3

## 2015-02-10 MED ORDER — HEPARIN SOD (PORK) LOCK FLUSH 100 UNIT/ML IV SOLN
INTRAVENOUS | Status: AC
  Filled 2015-02-10: qty 5

## 2015-02-10 MED ORDER — HEPARIN SOD (PORK) LOCK FLUSH 100 UNIT/ML IV SOLN
500.0000 [IU] | INTRAVENOUS | Status: AC | PRN
  Administered 2015-02-10: 500 [IU]

## 2015-02-10 MED ORDER — HEPARIN SOD (PORK) LOCK FLUSH 100 UNIT/ML IV SOLN: 500.0000 [IU] | INTRAVENOUS | Status: DC | PRN

## 2015-02-10 NOTE — Progress Notes (Addendum)
Arrived today for routine port a cath flush, sterile technique used blood return noted flushed with heparin as ordered.Tolerated well. Patient states will call for followup appointment due to busy schedule.

## 2015-02-15 ENCOUNTER — Other Ambulatory Visit: Payer: Self-pay | Admitting: Cardiology

## 2015-02-15 NOTE — Telephone Encounter (Signed)
REFILL 

## 2015-02-17 ENCOUNTER — Telehealth: Payer: Self-pay | Admitting: Cardiology

## 2015-02-17 NOTE — Telephone Encounter (Signed)
°  1. Which medications need to be refilled? Isosorbide  2. Which pharmacy is medication to be sent to? [facility]  3. Do they need a 30 day or 90 day supply? 90  4. Would they like a call back once the medication has been sent to the pharmacy? No

## 2015-02-17 NOTE — Telephone Encounter (Signed)
90 tablet 0 02/15/2015   Isosorbide refilled as denoted above.

## 2015-04-04 ENCOUNTER — Telehealth: Payer: Self-pay | Admitting: Cardiology

## 2015-04-04 MED ORDER — SIMVASTATIN 10 MG PO TABS: 5.0000 mg | ORAL_TABLET | Freq: Every day | ORAL | Status: DC

## 2015-04-04 NOTE — Telephone Encounter (Signed)
Pt now seeing Dr. Gerarda Fraction, historically Dr. Orson Ape filled triazolam. Dr. Gerarda Fraction doesn't want to fill, would prefer him to try something else - patient states he has tried [place] in past, other meds - this is the medication which works best for him.  Dr. Ellyn Hack notes OK to fill. Advised pt in this case will be glad to call pharmacy & notify to dispense. Pt voiced thanks.  Authorized phone-in refill of this medication w/ #60 dispense & 2 refills.

## 2015-04-04 NOTE — Telephone Encounter (Signed)
Usually a med that I defer to PCP.  I think he has been on it a long time (& may have been started by Dr. Rex Kras). If PCP will not refill, I am ok with it in this circumstance.  Leonie Man, MD

## 2015-04-04 NOTE — Telephone Encounter (Signed)
Pt requesting refill for Triazolam, will defer to Dr. Ellyn Hack for [place] & dispense amt.

## 2015-04-04 NOTE — Telephone Encounter (Signed)
Sent refill for Simvastatin to [facility] in [place], [place] for patient informing of this.

## 2015-04-04 NOTE — Telephone Encounter (Signed)
°  1. Which medications need to be refilled? Simvastatin and Triazolam  2. Which pharmacy is medication to be sent to?[facility] in [place]   3. Do they need a 30 day or 90 day supply? 90  4. Would they like a call back once the medication has been sent to the pharmacy? Yes

## 2015-04-04 NOTE — Telephone Encounter (Signed)
Left msg for patient to call. 

## 2015-04-06 ENCOUNTER — Other Ambulatory Visit: Payer: Self-pay | Admitting: *Deleted

## 2015-04-12 ENCOUNTER — Other Ambulatory Visit: Payer: Self-pay

## 2015-04-21 ENCOUNTER — Other Ambulatory Visit (HOSPITAL_COMMUNITY): Payer: Self-pay | Admitting: Dermatology

## 2015-04-21 DIAGNOSIS — R911 Solitary pulmonary nodule: Secondary | ICD-10-CM

## 2015-04-27 ENCOUNTER — Ambulatory Visit (HOSPITAL_COMMUNITY)
Admission: RE | Admit: 2015-04-27 | Discharge: 2015-04-27 | Disposition: A | Discharge status: Home / Self Care | Payer: Medicare Other | Source: Ambulatory Visit | Attending: Dermatology | Admitting: Dermatology

## 2015-04-27 ENCOUNTER — Encounter (HOSPITAL_COMMUNITY): Payer: Self-pay

## 2015-04-27 ENCOUNTER — Encounter (HOSPITAL_COMMUNITY)
Admission: RE | Admit: 2015-04-27 | Discharge: 2015-04-27 | Disposition: A | Discharge status: Home / Self Care | Payer: Medicare Other | Source: Ambulatory Visit | Attending: Family Medicine | Admitting: Family Medicine

## 2015-04-27 DIAGNOSIS — I2510 Atherosclerotic heart disease of native coronary artery without angina pectoris: Secondary | ICD-10-CM | POA: Diagnosis present

## 2015-04-27 DIAGNOSIS — Z9889 Other specified postprocedural states: Secondary | ICD-10-CM | POA: Insufficient documentation

## 2015-04-27 DIAGNOSIS — R911 Solitary pulmonary nodule: Secondary | ICD-10-CM

## 2015-04-27 DIAGNOSIS — R918 Other nonspecific abnormal finding of lung field: Secondary | ICD-10-CM | POA: Insufficient documentation

## 2015-04-27 DIAGNOSIS — K760 Fatty (change of) liver, not elsewhere classified: Secondary | ICD-10-CM | POA: Diagnosis not present

## 2015-04-27 DIAGNOSIS — K7689 Other specified diseases of liver: Secondary | ICD-10-CM | POA: Insufficient documentation

## 2015-04-27 LAB — POCT I-STAT CREATININE: CREATININE: 1 mg/dL (ref 0.61–1.24)

## 2015-04-27 MED ORDER — HEPARIN SOD (PORK) LOCK FLUSH 100 UNIT/ML IV SOLN
INTRAVENOUS | Status: AC
  Filled 2015-04-27: qty 5

## 2015-04-27 MED ORDER — IOHEXOL 300 MG/ML  SOLN
75.0000 mL | Freq: Once | INTRAMUSCULAR | Status: AC | PRN
  Administered 2015-04-27: 75 mL via INTRAVENOUS

## 2015-04-27 MED ORDER — HEPARIN SOD (PORK) LOCK FLUSH 100 UNIT/ML IV SOLN
500.0000 [IU] | INTRAVENOUS | Status: AC | PRN
  Administered 2015-04-27: 500 [IU]

## 2015-04-27 MED ORDER — SODIUM CHLORIDE 0.9 % IJ SOLN
10.0000 mL | INTRAMUSCULAR | Status: AC | PRN
  Administered 2015-04-27: 10 mL

## 2015-04-27 NOTE — Progress Notes (Signed)
Port a cath accessed with blood return and flushed with 500 units of heparin without difficulty. Tolerated well. Patient prefers to call with next appointment time in 6-8 weeks.

## 2015-05-22 NOTE — Progress Notes (Signed)
PCP: Glo Herring., MD  Clinic Note: Chief Complaint  Patient presents with  . Follow-up    6 mo//pt c/o minor swelling in legs/feet/ankles; no other Sx.  . Coronary Artery Disease  . Hypertension    HPI: Arthur Lutz is a 74 y.o. male with a PMH below who presents today for 6 month f/u of CAD-PCI. He was a former patient of Dr. Chase Picket, who's CAD history dates back to 2009 had a "mild heart attack". He underwent PCI of the RCA & balloon PTCA to the ostium of the first diagonal branch. In January 2013 he then had PCI of the Circumflex and Cutting Balloon PTCA of the a diagonal branch. These sites were all patent in August 2013 repeat cardiac catheterization. He had a negative Myoview in February 2014 as part of preoperative risk assessment.  When I last saw him in April 2015, he had been on some weight, and was really being bothered by his arthritis pains. He is statin intolerant.  He has recurrent bladder cancer.  He was recently diagnosed with macular degeneration on his right eye and is getting shots.  Now noted in Left eye - but with no change in vision in Left - significant loss in R eye.  Arthur Lutz was last seen in April 2016.  Recent Hospitalizations: None  Studies Reviewed: None; Just had blood drawn yesterday.  Interval History: He is doing well for the most part.  Still has "a little bit of the throat burning" with walking up a hill / gets HR up.  No significant change from when we did his last Cath.  Average amount of activity & still works on the farm all day, walking around.  Still more limited by Knee pain > dyspnea.   Will get SOB walking up hill or up steps - no change from baseline.  Biggest issue is Macular Degeneration - on shots @ [place].  The remainder of cardiac review of systems is as follows: positive for - Mild leg swelling negative for - chest pain, irregular heartbeat, loss of consciousness, murmur, orthopnea, palpitations, paroxysmal  nocturnal dyspnea, rapid heart rate, shortness of breath or  syncope/near-syncope, TIA/amaurosis fugax    ROS: A comprehensive was performed. Review of Systems  Constitutional: Negative for malaise/fatigue.  HENT: Negative for nosebleeds.   Eyes: Negative for pain.       Macular Degeneration related vision loss in R eye  Respiratory: Negative for cough.   Gastrointestinal: Negative for blood in stool and melena.  Genitourinary: Negative for hematuria.  Musculoskeletal: Positive for joint pain (bilateral knee pain limits activity).  Neurological: Positive for headaches (due to vision issues). Negative for dizziness (due to vision issues) and weakness.  Endo/Heme/Allergies: Does not bruise/bleed easily.  All other systems reviewed and are negative.   Past Medical History  Diagnosis Date  . History of heart attack 11/2007    "mild; did not affect the heart muscle"; PCI to RCA, Cutting PTCA Diag ostium  . CAD S/P percutaneous coronary angioplasty 08/07/2011    NEW 08/07/11 cutting balloon PTCA to 95% ostial diag. and 90%  proximal LCX.  prior cutting balloon and to RCA  & ostium of ostial diag; Last Cath 02/2012 - Patent Diag PTCA, RCA & Circ BMS stents. Normal EF & EDP; Myoview 2/14 no evidence of ischemia or infarction  . Dyslipidemia   . Hypertension   . Diabetes mellitus type 2, controlled, with complications ([place])   . Stroke [facility]) 1980's    "  had facial strokes; 2; about a year apart; never did find what caused them"  . Melanoma of skin ([place]) ~ 2005    "level 2; on my back"  . Bladder cancer ([place]) 11/2005  . Statin intolerance 08/07/2011  . Arthritis of knee, left, needs total Knee in future with Dr. Wynelle Link  08/07/2011  . Portacath in place 08/11/2012  . Aortic valve sclerosis     Cause of murmur  . Osteoarthritis     Status post right knee arthroplasty, positive for staph infection. Pending right arthroplasty  . Allergic rhinitis   . Macular degeneration of right eye     Now getting  shots.   . Macular degeneration   . Diabetes mellitus without complication ([place])   . Myocardial infarction [facility]) 2012    Past Surgical History  Procedure Laterality Date  . Total knee arthroplasty  11/2003    right. March of 2014. Total of 3.  . Inguinal hernia repair  ~ 1970    left  . Tonsillectomy      "as a child"  . Cataract extraction w/ intraocular lens implant  ~ 2010    right  . Partial knee arthroplasty  06/2003    right  . Knee arthroscopy  02/20/11    left  . Cold cup removal bladder lesion  11/2005    "malignant"  . Total knee arthroplasty Left 09/28/2012    Procedure: TOTAL KNEE ARTHROPLASTY;  Surgeon: Gearlean Alf, MD;  Location: [facility] ORS;  Service: Orthopedics;  Laterality: Left;  . Doppler echocardiography  May 2009    Normal EF, impaired relaxation, with sclerotic aortic valve. No stenosis.  Marland Kitchen Nm myoview ltd  Jan. 31, 2014    Low risk, EF 49%, improved from prior. Small apical and apical septal defect, fixed likely artifact no skin or infarction.  . Coronary angioplasty with stent placement  11/2007    Mid RCA - Driver BMS 3.5 mm x 15 mm; 2.0 mm Cutting Balloon PTCA - ostial D1  . Coronary angioplasty with stent placement  08/07/11    Abnormal TM Myoview - for exertional throat discomfort: Patent RCA stent, 90% circumflex -- Vision BMS 3.5 mm x 12 mm --> 4.2 mm. Ostial D2 95% -- 2.25 mm Cutting Balloon PTCA  . Cardiac catheterization  August '13    Widely patent RCA and LCx stents. Also patent PTCA site to D1, ~40-50% D2. Normal EF, normal EDP  . Left heart catheterization with coronary angiogram N/A 08/07/2011    Procedure: LEFT HEART CATHETERIZATION WITH CORONARY ANGIOGRAM;  Surgeon: Leonie Man, MD;  Location: [facility] CATH LAB;  Service: Cardiovascular;  Laterality: N/A;  . Percutaneous coronary stent intervention (pci-s)  08/07/2011    Procedure: PERCUTANEOUS CORONARY STENT INTERVENTION (PCI-S);  Surgeon: Leonie Man, MD;  Location: [facility] CATH LAB;  Service:  Cardiovascular;;  . Left heart catheterization with coronary angiogram N/A 03/06/2012    Procedure: LEFT HEART CATHETERIZATION WITH CORONARY ANGIOGRAM;  Surgeon: Leonie Man, MD;  Location: [facility] CATH LAB;  Service: Cardiovascular;  Laterality: N/A;  . Colonoscopy N/A 12/09/2014    Procedure: COLONOSCOPY;  Surgeon: Rogene Houston, MD;  Location: [facility] ENDO SUITE;  Service: Endoscopy;  Laterality: N/A;  955 - moved to 8:30 - Ann to notify pt   Prior to Admission medications   Medication Sig Start Date End Date Taking? Authorizing Provider  acetaminophen (TYLENOL) 325 MG tablet Take 2 tablets (650 mg total) by mouth every 6 (six) hours as needed.  10/01/12   Arlee Muslim, PA-C  amLODipine (NORVASC) 5 MG tablet TAKE 1 BY MOUTH DAILY AFTER BREAKFAST 02/15/15   Leonie Man, MD  dexlansoprazole (DEXILANT) 60 MG capsule Take 1 capsule (60 mg total) by mouth daily. 11/24/14   Butch Penny, NP  diphenhydrAMINE (BENADRYL) 25 mg capsule Take 25 mg by mouth at bedtime as needed for sleep.    Historical Provider, MD  isosorbide mononitrate (IMDUR) 60 MG 24 hr tablet TAKE 1 BY MOUTH DAILY 02/15/15   Leonie Man, MD  Linaclotide [facility]) 145 MCG CAPS capsule Take 1 capsule (145 mcg total) by mouth daily. 12/09/14   Rogene Houston, MD  losartan-hydrochlorothiazide (HYZAAR) 100-25 MG per tablet Take 0.5 tablets by mouth daily. 07/18/14   Leonie Man, MD  Melatonin 10 MG CAPS Take 10 tablets by mouth daily. For sleep    Historical Provider, MD  metFORMIN (GLUCOPHAGE) 500 MG tablet Take 500 mg by mouth daily with breakfast.     Historical Provider, MD  metoprolol succinate (TOPROL-XL) 25 MG 24 hr tablet TAKE 1/2 TABLET BY MOUTH EVERY DAY 11/28/14   Leonie Man, MD  Multiple Vitamins-Minerals (PRESERVISION AREDS PO) Take by mouth.    Historical Provider, MD  simvastatin (ZOCOR) 10 MG tablet Take 0.5 tablets (5 mg total) by mouth daily at 6 PM. Take 1/2 tablet a day by mouth Patient taking differently: Take 10  mg by mouth daily. Take 1/2 tablet a day by mouth 04/04/15   Leonie Man, MD  tamsulosin (FLOMAX) 0.4 MG CAPS capsule Take 0.4 mg by mouth as needed.    Historical Provider, MD  triazolam (HALCION) 0.25 MG tablet Take 1-2 tablets (0.25-0.5 mg total) by mouth at bedtime as needed for sleep. 11/21/14   Leonie Man, MD   Allergies  Allergen Reactions  . Statins Other (See Comments)    Myalgias, tired crestor -aches     Social History   Social History  . Marital Status: Married    Spouse Name: N/A  . Number of Children: N/A  . Years of Education: N/A   Social History Main Topics  . Smoking status: Former Smoker -- 2.00 packs/day for 25 years    Types: Cigarettes    Quit date: 07/29/1980  . Smokeless tobacco: Never Used  . Alcohol Use: 1.1 oz/week    1 Glasses of wine, 1 Standard drinks or equivalent per week     Comment:  "wine or whiskey" occasionally  . Drug Use: No  . Sexual Activity: No   Other Topics Concern  . None   Social History Narrative   He is a married father of 2, grandfather of 68. He is not really getting a lot of exercise now. He previously had been working out on a treadmill at least 2 times a day for 15 minutes at a time, but he   is not able to do that as much now because his knee hurts him too bad. Does not smoke and only occasional alcoholic beverage.   Family History  Problem Relation Age of Onset  . Emphysema Father   . Lung cancer Paternal Uncle     x 2 smokers  . Colon cancer Neg Hx      Wt Readings from Last 3 Encounters:  05/23/15 261 lb 6.4 oz (118.57 kg)  04/27/15 260 lb (117.935 kg)  02/10/15 261 lb (118.389 kg)    PHYSICAL EXAM BP 150/80 mmHg  Pulse 57  Ht 5\' 7"  (  1.702 m)  Wt 261 lb 6.4 oz (118.57 kg)  BMI 40.93 kg/m2 General, he is a pleasant, healthy-appearing gentleman who is mildly to moderately obese; No acute distress, alert and oriented x3, answers questions appropriately.  HEENT: NCAT. EOMI. MMM. Anicteric sclerae.   Neck: Supple. No LAN, JVD, or carotid bruit.  Heart: RRR. Normal S1, S2. He has a 1/6 crescendo-decrescendo SEM heard at the right upper sternal border consistent with aortic sclerosis. No R/G.  Lungs: CTAB. Nonlabored. Normal respiratory effort. Good air movement. No wheezes, rales, rhonchi.  Abdomen: Obese, but otherwise soft, NT, ND. NABS. No HSM.  Extremities: Maybe 1+ lower extremity edema, but 2+ equal pulses throughout. He does have mild venous stasis dermatitis changes. Neuro: Grossly intact.    Adult ECG Report  Rate: 57 ;  Rhythm: sinus bradycardia and normal axix, intervals & durations;   Narrative Interpretation: Essentially normal EKG   Other studies Reviewed: Additional studies/ records that were reviewed today include:  Recent Labs:  Labs checked by PCP - not available   ASSESSMENT / PLAN: Problem List Items Addressed This Visit    Venous stasis of both lower extremities (Chronic)    No CHF Sx.   Recommend support stocking.      Relevant Medications   amLODipine (NORVASC) 10 MG tablet   Other Relevant Orders   EKG 12-Lead   Statin intolerance (Chronic)   Relevant Orders   EKG 12-Lead   Obesity (BMI 30.0-34.9) (Chronic)    Difficulty with exercise due to Knee pain --> is looking into going to the [facility]. I recommended looking into [employer] program - needs to see if covered by Medicare -- should cover with h/o CAD. Also recommended Water aerobics/water walking.      Relevant Orders   EKG 12-Lead   Hyperlipemia, intolerant to statins. - Primary (Chronic)    Awaiting Lipid check results -  If he remains inadequately controlled, will add Zetia & refer to [facility] Pharmacist run [facility].      Relevant Medications   amLODipine (NORVASC) 10 MG tablet   Other Relevant Orders   EKG 12-Lead   Essential hypertension (Chronic)    Again, inadequately controlled --> Increase Amlodipine to 10 mg (had been reduced to 5mg ).       Relevant  Medications   amLODipine (NORVASC) 10 MG tablet   Other Relevant Orders   EKG 12-Lead   Chronic stable angina (HCC) - negative Myoview & no new Dz on Cath (Chronic)    Only has mild anginal Sx with vigorous exertion. On Imdur (only taking 1/2 of dose -- can increase if NTG requirement increases. Increasing amlodipine. Cannot titrate BB any further      Relevant Medications   amLODipine (NORVASC) 10 MG tablet   Other Relevant Orders   EKG 12-Lead   CAD S/P percutaneous coronary angioplasty: 08/07/11 cutting balloon PTCA to 95% ostial diag. and 90%  proximal LCX.  prior cutting balloon to RCA  & ostium of ostial diag., 2009  (Chronic)    Stable symptoms - I would not be surprised if this is related to Microvascular over Macrovascular disease as his ischemic evaluations have been negative.  ON ARB&  Very low dose BB (due to fatiue & bradycardia).  On CCB for BP & antianginal effect. --> increasing amlodipine to 10 mg On intermittent statin dosing - has statin intolerance Is taking ASA      Relevant Medications   amLODipine (NORVASC) 10 MG tablet  Other Relevant Orders   EKG 12-Lead      Current medicines are reviewed at length with the patient today. (+/- concerns) none The following changes have been made: increase Amlodipine to 10mg  PO daily  Look into [employer] program @ [employer] - need to see if covered by Medicare.  Studies Ordered:   Orders Placed This Encounter  Procedures  . EKG 12-Lead     ROV 6 months    Berta Denson, Leonie Green, M.D., M.S. Interventional Cardiologist   Pager # [phone]

## 2015-05-23 ENCOUNTER — Ambulatory Visit (INDEPENDENT_AMBULATORY_CARE_PROVIDER_SITE_OTHER): Payer: Medicare Other | Admitting: Cardiology

## 2015-05-23 ENCOUNTER — Encounter: Payer: Self-pay | Admitting: Cardiology

## 2015-05-23 VITALS — BP 150/80 | HR 57 | Ht 67.0 in | Wt 261.4 lb

## 2015-05-23 DIAGNOSIS — E785 Hyperlipidemia, unspecified: Secondary | ICD-10-CM

## 2015-05-23 DIAGNOSIS — I2510 Atherosclerotic heart disease of native coronary artery without angina pectoris: Secondary | ICD-10-CM

## 2015-05-23 DIAGNOSIS — Z789 Other specified health status: Secondary | ICD-10-CM

## 2015-05-23 DIAGNOSIS — I208 Other forms of angina pectoris: Secondary | ICD-10-CM | POA: Insufficient documentation

## 2015-05-23 DIAGNOSIS — I878 Other specified disorders of veins: Secondary | ICD-10-CM

## 2015-05-23 DIAGNOSIS — E669 Obesity, unspecified: Secondary | ICD-10-CM

## 2015-05-23 DIAGNOSIS — Z9861 Coronary angioplasty status: Secondary | ICD-10-CM | POA: Diagnosis not present

## 2015-05-23 DIAGNOSIS — I10 Essential (primary) hypertension: Secondary | ICD-10-CM | POA: Diagnosis not present

## 2015-05-23 DIAGNOSIS — Z889 Allergy status to unspecified drugs, medicaments and biological substances status: Secondary | ICD-10-CM

## 2015-05-23 MED ORDER — AMLODIPINE BESYLATE 10 MG PO TABS: 10.0000 mg | ORAL_TABLET | Freq: Every day | ORAL | Status: DC

## 2015-05-23 NOTE — Assessment & Plan Note (Signed)
Again, inadequately controlled --> Increase Amlodipine to 10 mg (had been reduced to 5mg ).

## 2015-05-23 NOTE — Patient Instructions (Signed)
INCREASE AMLODIPINE TO 10 MG ONE TABLET DAILY   NO OTHER CHANGES  CONTACT HEALTH INSURANCE - TO SEE IF YOU ARE ELIGIBLE FOR SILVER SNEAKER OR SOMETHING LIKE THAT  (LOCAL EXERCISE )   Your physician wants you to follow-up in [place] [address] [facility]. You will receive a reminder letter in the mail two months in advance. If you don't receive a letter, please call our office to schedule the follow-up appointment.  If you need a refill on your cardiac medications before your next appointment, please call your pharmacy.

## 2015-05-23 NOTE — Assessment & Plan Note (Signed)
Difficulty with exercise due to Knee pain --> is looking into going to the [facility]. I recommended looking into [employer] program - needs to see if covered by Medicare -- should cover with h/o CAD. Also recommended Water aerobics/water walking.

## 2015-05-23 NOTE — Assessment & Plan Note (Signed)
Awaiting Lipid check results -  If he remains inadequately controlled, will add Zetia & refer to [facility] Pharmacist run [facility].

## 2015-05-23 NOTE — Assessment & Plan Note (Addendum)
Stable symptoms - I would not be surprised if this is related to Microvascular over Macrovascular disease as his ischemic evaluations have been negative.  ON ARB&  Very low dose BB (due to fatiue & bradycardia).  On CCB for BP & antianginal effect. --> increasing amlodipine to 10 mg On intermittent statin dosing - has statin intolerance Is taking ASA

## 2015-05-23 NOTE — Assessment & Plan Note (Addendum)
Only has mild anginal Sx with vigorous exertion. On Imdur (only taking 1/2 of dose -- can increase if NTG requirement increases. Increasing amlodipine. Cannot titrate BB any further

## 2015-05-23 NOTE — Assessment & Plan Note (Signed)
No CHF Sx.   Recommend support stocking.

## 2015-06-12 ENCOUNTER — Other Ambulatory Visit: Payer: Self-pay | Admitting: Cardiology

## 2015-06-14 NOTE — Telephone Encounter (Signed)
Rx(s) sent to pharmacy electronically.  

## 2015-07-25 ENCOUNTER — Other Ambulatory Visit: Payer: Self-pay | Admitting: Cardiology

## 2015-07-25 NOTE — Telephone Encounter (Signed)
Rx request sent to pharmacy.  

## 2015-08-03 ENCOUNTER — Encounter (HOSPITAL_COMMUNITY): Payer: Self-pay

## 2015-08-03 ENCOUNTER — Encounter (HOSPITAL_COMMUNITY)
Admission: RE | Admit: 2015-08-03 | Discharge: 2015-08-03 | Disposition: A | Discharge status: Home / Self Care | Payer: Medicare Other | Source: Ambulatory Visit | Attending: Internal Medicine | Admitting: Internal Medicine

## 2015-08-03 DIAGNOSIS — Z452 Encounter for adjustment and management of vascular access device: Secondary | ICD-10-CM | POA: Diagnosis present

## 2015-08-03 MED ORDER — HEPARIN SOD (PORK) LOCK FLUSH 100 UNIT/ML IV SOLN
INTRAVENOUS | Status: AC
  Filled 2015-08-03: qty 5

## 2015-08-03 MED ORDER — SODIUM CHLORIDE 0.9 % IJ SOLN
INTRAMUSCULAR | Status: AC
  Filled 2015-08-03: qty 3

## 2015-08-03 MED ORDER — HEPARIN SOD (PORK) LOCK FLUSH 100 UNIT/ML IV SOLN
500.0000 [IU] | INTRAVENOUS | Status: AC | PRN
  Administered 2015-08-03: 500 [IU]

## 2015-08-03 MED ORDER — SODIUM CHLORIDE 0.9 % IJ SOLN
10.0000 mL | INTRAMUSCULAR | Status: AC | PRN
  Administered 2015-08-03: 10 mL

## 2015-08-03 NOTE — Progress Notes (Addendum)
Arthur Lutz presented for Portacath access and flush. Portacath located left chest wall accessed with 19 gauge needle after sterile technique, blood return noted. Portacath flushed with 10cc of normal saline followed by 500 units Heparin  per protocol and needle removed intact. Procedure without incident. Patient tolerated procedure well.  Patient declines to make next appointment, patient will call to schedule.

## 2015-08-31 DIAGNOSIS — H353213 Exudative age-related macular degeneration, right eye, with inactive scar: Secondary | ICD-10-CM | POA: Diagnosis not present

## 2015-08-31 DIAGNOSIS — H353221 Exudative age-related macular degeneration, left eye, with active choroidal neovascularization: Secondary | ICD-10-CM | POA: Diagnosis not present

## 2015-08-31 DIAGNOSIS — H43813 Vitreous degeneration, bilateral: Secondary | ICD-10-CM | POA: Diagnosis not present

## 2015-09-01 ENCOUNTER — Telehealth: Payer: Self-pay | Admitting: Cardiology

## 2015-09-01 NOTE — Telephone Encounter (Signed)
Returned patient call, phone rings w/ no answer or VM pickup.  Pt does not currently have ASA 81mg  listed on meds.  Doubt any problem discontinuing. Routed to Dr. Ellyn Hack, can reattempt to call pt on Monday.

## 2015-09-01 NOTE — Telephone Encounter (Signed)
New message      Pt saw an eye doctor because he has in both eyes a blood vessel that has burst.  The eye doctor want him to stop his 81mg  aspirin.  His bp is 147/78.  Will it be ok to stop aspirin?

## 2015-09-04 NOTE — Telephone Encounter (Signed)
Recommendations relayed to patient who voiced understanding.

## 2015-09-04 NOTE — Telephone Encounter (Signed)
OK to hold plavix [facility]

## 2015-09-08 DIAGNOSIS — H353221 Exudative age-related macular degeneration, left eye, with active choroidal neovascularization: Secondary | ICD-10-CM | POA: Diagnosis not present

## 2015-09-20 DIAGNOSIS — Z23 Encounter for immunization: Secondary | ICD-10-CM | POA: Diagnosis not present

## 2015-09-20 DIAGNOSIS — B079 Viral wart, unspecified: Secondary | ICD-10-CM | POA: Diagnosis not present

## 2015-09-20 DIAGNOSIS — Z85820 Personal history of malignant melanoma of skin: Secondary | ICD-10-CM | POA: Diagnosis not present

## 2015-09-20 DIAGNOSIS — L409 Psoriasis, unspecified: Secondary | ICD-10-CM | POA: Diagnosis not present

## 2015-09-20 DIAGNOSIS — L821 Other seborrheic keratosis: Secondary | ICD-10-CM | POA: Diagnosis not present

## 2015-10-02 DIAGNOSIS — E1140 Type 2 diabetes mellitus with diabetic neuropathy, unspecified: Secondary | ICD-10-CM | POA: Diagnosis not present

## 2015-10-02 DIAGNOSIS — E1151 Type 2 diabetes mellitus with diabetic peripheral angiopathy without gangrene: Secondary | ICD-10-CM | POA: Diagnosis not present

## 2015-10-05 ENCOUNTER — Other Ambulatory Visit: Payer: Self-pay | Admitting: Cardiology

## 2015-10-06 NOTE — Telephone Encounter (Signed)
If PCP will not Rx - I think he has been on this since Dr. Rex Kras prescribed it.  OK to fill.  Leonie Man, MD

## 2015-10-06 NOTE — Telephone Encounter (Signed)
Rx(s) sent to pharmacy electronically.  

## 2015-10-06 NOTE — Telephone Encounter (Signed)
Triazolam Refill request routed to MD Last refilled 11/21/2014 during OV 60 tablet 1 11/21/2014

## 2015-10-07 NOTE — Telephone Encounter (Signed)
This was a medicine that was previously prescribed by Dr. Aldona Bar. He doesn't have a PCP will followed up. He uses to sleep and I'm fine with keeping on.  Leonie Man, MD

## 2015-10-10 DIAGNOSIS — H43813 Vitreous degeneration, bilateral: Secondary | ICD-10-CM | POA: Diagnosis not present

## 2015-10-10 DIAGNOSIS — H353221 Exudative age-related macular degeneration, left eye, with active choroidal neovascularization: Secondary | ICD-10-CM | POA: Diagnosis not present

## 2015-10-10 DIAGNOSIS — H3563 Retinal hemorrhage, bilateral: Secondary | ICD-10-CM | POA: Diagnosis not present

## 2015-10-10 DIAGNOSIS — H353212 Exudative age-related macular degeneration, right eye, with inactive choroidal neovascularization: Secondary | ICD-10-CM | POA: Diagnosis not present

## 2015-10-10 NOTE — Telephone Encounter (Signed)
I'm okay with refilling

## 2015-10-11 ENCOUNTER — Other Ambulatory Visit: Payer: Self-pay

## 2015-10-11 ENCOUNTER — Other Ambulatory Visit: Payer: Self-pay | Admitting: *Deleted

## 2015-10-11 MED ORDER — TRIAZOLAM 0.25 MG PO TABS: ORAL_TABLET | ORAL | Status: DC

## 2015-10-11 NOTE — Telephone Encounter (Signed)
Med has been called in to pharmacy patient has been notified

## 2015-10-11 NOTE — Telephone Encounter (Signed)
Patient left a message on the refill voicemail stating that the rx for halcion was still not at the pharmacy. He stated that he was told that a sixty day supply would be sent in for him last week. He is now out of the medication.

## 2015-10-24 DIAGNOSIS — H2512 Age-related nuclear cataract, left eye: Secondary | ICD-10-CM | POA: Diagnosis not present

## 2015-10-24 DIAGNOSIS — H3092 Unspecified chorioretinal inflammation, left eye: Secondary | ICD-10-CM | POA: Diagnosis not present

## 2015-10-24 DIAGNOSIS — H353231 Exudative age-related macular degeneration, bilateral, with active choroidal neovascularization: Secondary | ICD-10-CM | POA: Diagnosis not present

## 2015-10-24 DIAGNOSIS — Z961 Presence of intraocular lens: Secondary | ICD-10-CM | POA: Diagnosis not present

## 2015-11-06 DIAGNOSIS — H35352 Cystoid macular degeneration, left eye: Secondary | ICD-10-CM | POA: Diagnosis not present

## 2015-11-06 DIAGNOSIS — H353231 Exudative age-related macular degeneration, bilateral, with active choroidal neovascularization: Secondary | ICD-10-CM | POA: Diagnosis not present

## 2015-11-16 ENCOUNTER — Encounter: Payer: Self-pay | Admitting: Cardiology

## 2015-11-16 ENCOUNTER — Ambulatory Visit (INDEPENDENT_AMBULATORY_CARE_PROVIDER_SITE_OTHER): Payer: Medicare Other | Admitting: Cardiology

## 2015-11-16 VITALS — BP 160/80 | HR 83 | Ht 72.0 in | Wt 267.0 lb

## 2015-11-16 DIAGNOSIS — E669 Obesity, unspecified: Secondary | ICD-10-CM

## 2015-11-16 DIAGNOSIS — Z79899 Other long term (current) drug therapy: Secondary | ICD-10-CM

## 2015-11-16 DIAGNOSIS — N429 Disorder of prostate, unspecified: Secondary | ICD-10-CM

## 2015-11-16 DIAGNOSIS — I2089 Other forms of angina pectoris: Secondary | ICD-10-CM

## 2015-11-16 DIAGNOSIS — I208 Other forms of angina pectoris: Secondary | ICD-10-CM | POA: Diagnosis not present

## 2015-11-16 DIAGNOSIS — E785 Hyperlipidemia, unspecified: Secondary | ICD-10-CM | POA: Diagnosis not present

## 2015-11-16 DIAGNOSIS — I2510 Atherosclerotic heart disease of native coronary artery without angina pectoris: Secondary | ICD-10-CM | POA: Diagnosis not present

## 2015-11-16 DIAGNOSIS — R5383 Other fatigue: Secondary | ICD-10-CM

## 2015-11-16 DIAGNOSIS — Z9861 Coronary angioplasty status: Secondary | ICD-10-CM | POA: Diagnosis not present

## 2015-11-16 DIAGNOSIS — E118 Type 2 diabetes mellitus with unspecified complications: Secondary | ICD-10-CM

## 2015-11-16 DIAGNOSIS — I10 Essential (primary) hypertension: Secondary | ICD-10-CM

## 2015-11-16 DIAGNOSIS — F5102 Adjustment insomnia: Secondary | ICD-10-CM

## 2015-11-16 DIAGNOSIS — E66811 Obesity, class 1: Secondary | ICD-10-CM

## 2015-11-16 DIAGNOSIS — I878 Other specified disorders of veins: Secondary | ICD-10-CM

## 2015-11-16 MED ORDER — TRIAZOLAM 0.25 MG PO TABS: ORAL_TABLET | ORAL | Status: DC

## 2015-11-16 NOTE — Patient Instructions (Signed)
Your physician wants you to follow-up in: 6 Months. You will receive a reminder letter in the mail two months in advance. If you don't receive a letter, please call our office to schedule the follow-up appointment.  Your physician recommends that you return for lab work

## 2015-11-16 NOTE — Progress Notes (Signed)
PCP: Glo Herring., MD  Clinic Note: Chief Complaint  Patient presents with  . 6 MONTHS    CAD-PCI  . Edema    ANKLES - mild,normal    HPI: Arthur Lutz is a 75 y.o. male with a PMH below who presents today for 6 month f/u for CAD-PCI. He was a former patient of Dr. Chase Picket, who's CAD history dates back to 2009 had a "mild heart attack". He underwent PCI of the RCA & balloon PTCA to the ostium of the first diagonal branch. In January 2013 he then had PCI of the Circumflex and Cutting Balloon PTCA of the a diagonal branch. These sites were all patent in August 2013 repeat cardiac catheterization. He had a negative Myoview in February 2014 as part of preoperative risk assessment. Arthur Lutz was last seen in Oct 2016 - noted "throat burning with walking up hill".  Limited by knee pain > dyspnea. SOB up steps. Macular Degeneration shots @ [place].(R eye)  Recent Hospitalizations: n/a  Studies Reviewed: n/a -- His former PCP has retired, and therefore he has not had routine labs checked -- He indicates that he has not taken his blood pressure medications today.   Interval History: Arthur Lutz returns today for routine follow-up. He has been doing pretty well since his last visit.  Doesn't do a lot of exercise, but he does do as much she can to stay active. He walks around his farm[place]pasture added decent speed and denies any significant chest pain or dyspnea. He may get short of breath if he speeds up a little bit or free walks up a bit of a hill. Otherwise he denies any angina type symptoms. No heart failure symptoms of PND, orthopnea, he does have a stable mild edema. Usually the swelling goes away if he puts his feet up. Other cardiac review of symptoms as follows: No palpitations, lightheadedness, dizziness, weakness or syncope/near syncope. No TIA/amaurosis fugax symptoms. No claudication.  ROS: A comprehensive was performed. Review of Systems  Constitutional: Negative for  malaise/fatigue.  HENT: Negative for congestion and nosebleeds.   Eyes:       He's had repeat laser surgery in his right eye. Apparently he transferred his eye care to the [facility] area. Currently his left eye is 20/40 and his right eye is 20/200. He does notice a little bit of balance at about it because of his eyes.  Respiratory: Negative for cough, shortness of breath and wheezing.   Cardiovascular: Negative for claudication.  Gastrointestinal: Negative for blood in stool and melena.  Genitourinary: Negative for hematuria.  Musculoskeletal: Positive for joint pain (Usual knee and ankle pain.).  Neurological: Positive for tingling (He does have some peripheral neuropathy in his feet. He is very careful with his treatment of toenails and watching for injury.). Negative for dizziness, speech change, focal weakness, seizures, loss of consciousness and headaches.  Endo/Heme/Allergies: Does not bruise/bleed easily.  Psychiatric/Behavioral: Negative for depression and memory loss. The patient is not nervous/anxious and does not have insomnia.   All other systems reviewed and are negative.    Past Medical History  Diagnosis Date  . History of heart attack 11/2007    "mild; did not affect the heart muscle"; PCI to RCA, Cutting PTCA Diag ostium  . CAD S/P percutaneous coronary angioplasty 08/07/2011    NEW 08/07/11 cutting balloon PTCA to 95% ostial diag. and 90%  proximal LCX.  prior cutting balloon and to RCA  & ostium of ostial diag;  Last Cath 02/2012 - Patent Diag PTCA, RCA & Circ BMS stents. Normal EF & EDP; Myoview 2/14 no evidence of ischemia or infarction  . Dyslipidemia   . Hypertension   . Diabetes mellitus type 2, controlled, with complications ([place])   . Stroke [facility]) 1980's    "had facial strokes; 2; about a year apart; never did find what caused them"  . Melanoma of skin ([place]) ~ 2005    "level 2; on my back"  . Bladder cancer ([place]) 11/2005  . Statin intolerance 08/07/2011  . Arthritis  of knee, left, needs total Knee in future with Dr. Wynelle Link  08/07/2011  . Portacath in place 08/11/2012  . Aortic valve sclerosis     Cause of murmur  . Osteoarthritis     Status post right knee arthroplasty, positive for staph infection. Pending right arthroplasty  . Allergic rhinitis   . Macular degeneration of right eye     Now getting shots.   . Macular degeneration   . Diabetes mellitus without complication ([place])   . Myocardial infarction [facility]) 2012    Past Surgical History  Procedure Laterality Date  . Total knee arthroplasty  11/2003    right. March of 2014. Total of 3.  . Inguinal hernia repair  ~ 1970    left  . Tonsillectomy      "as a child"  . Cataract extraction w/ intraocular lens implant  ~ 2010    right  . Partial knee arthroplasty  06/2003    right  . Knee arthroscopy  02/20/11    left  . Cold cup removal bladder lesion  11/2005    "malignant"  . Total knee arthroplasty Left 09/28/2012    Procedure: TOTAL KNEE ARTHROPLASTY;  Surgeon: Gearlean Alf, MD;  Location: [facility] ORS;  Service: Orthopedics;  Laterality: Left;  . Doppler echocardiography  May 2009    Normal EF, impaired relaxation, with sclerotic aortic valve. No stenosis.  Marland Kitchen Nm myoview ltd  Jan. 31, 2014    Low risk, EF 49%, improved from prior. Small apical and apical septal defect, fixed likely artifact no skin or infarction.  . Coronary angioplasty with stent placement  11/2007    Mid RCA - Driver BMS 3.5 mm x 15 mm; 2.0 mm Cutting Balloon PTCA - ostial D1  . Coronary angioplasty with stent placement  08/07/11    Abnormal TM Myoview - for exertional throat discomfort: Patent RCA stent, 90% circumflex -- Vision BMS 3.5 mm x 12 mm --> 4.2 mm. Ostial D2 95% -- 2.25 mm Cutting Balloon PTCA  . Cardiac catheterization  August '13    Widely patent RCA and LCx stents. Also patent PTCA site to D1, ~40-50% D2. Normal EF, normal EDP  . Left heart catheterization with coronary angiogram N/A 08/07/2011    Procedure: LEFT  HEART CATHETERIZATION WITH CORONARY ANGIOGRAM;  Surgeon: Leonie Man, MD;  Location: [facility] CATH LAB;  Service: Cardiovascular;  Laterality: N/A;  . Percutaneous coronary stent intervention (pci-s)  08/07/2011    Procedure: PERCUTANEOUS CORONARY STENT INTERVENTION (PCI-S);  Surgeon: Leonie Man, MD;  Location: [facility] CATH LAB;  Service: Cardiovascular;;  . Left heart catheterization with coronary angiogram N/A 03/06/2012    Procedure: LEFT HEART CATHETERIZATION WITH CORONARY ANGIOGRAM;  Surgeon: Leonie Man, MD;  Location: [facility] CATH LAB;  Service: Cardiovascular;  Laterality: N/A;  . Colonoscopy N/A 12/09/2014    Procedure: COLONOSCOPY;  Surgeon: Rogene Houston, MD;  Location: [facility] ENDO SUITE;  Service: Endoscopy;  Laterality: N/A;  955 - moved to 8:30 - Ann to notify pt   Prior to Admission medications   Medication Sig Start Date End Date Taking? Authorizing Provider  acetaminophen (TYLENOL) 325 MG tablet Take 2 tablets (650 mg total) by mouth every 6 (six) hours as needed. 10/01/12  Yes Arlee Muslim, PA-C  amLODipine (NORVASC) 5 MG tablet TAKE 1 BY MOUTH DAILY AFTER BREAKFAST 07/25/15  Yes Leonie Man, MD  clobetasol cream (TEMOVATE) [id number] % Apply 1 application topically as needed. 03/02/15  Yes Historical Provider, MD  isosorbide mononitrate (IMDUR) 60 MG 24 hr tablet TAKE 1 BY MOUTH DAILY 07/25/15  Yes Leonie Man, MD  losartan-hydrochlorothiazide (HYZAAR) 100-25 MG tablet Take 0.5 tablets by mouth daily. 06/14/15  Yes Leonie Man, MD  Melatonin 10 MG CAPS Take 10 tablets by mouth daily. For sleep   Yes Historical Provider, MD  metFORMIN (GLUCOPHAGE) 500 MG tablet Take 500 mg by mouth daily with breakfast.    Yes Historical Provider, MD  metoprolol succinate (TOPROL-XL) 25 MG 24 hr tablet TAKE 1/2 TABLET BY MOUTH EVERY DAY 11/28/14  Yes Leonie Man, MD  Multiple Vitamins-Minerals (PRESERVISION AREDS PO) Take by mouth.   Yes Historical Provider, MD  simvastatin (ZOCOR) 10 MG tablet Take  0.5 tablets (5 mg total) by mouth daily at 6 PM. Take 1/2 tablet a day by mouth Patient taking differently: Take 10 mg by mouth daily. Take 1/2 tablet a day by mouth 04/04/15  Yes Leonie Man, MD  tobramycin (TOBREX) 0.3 % ophthalmic solution Place 1 drop into both eyes 2 (two) times a week. 04/26/15  Yes Historical Provider, MD  triazolam (HALCION) 0.25 MG tablet TAKE 1 TO 2 TABS BY MOUTH EVERY NIGHT AT BEDTIME AS NEEDED FOR SLEEP 10/11/15  Yes Leonie Man, MD    Allergies  Allergen Reactions  . Statins Other (See Comments)    Myalgias, tired crestor -aches      Social History   Social History  . Marital Status: Married    Spouse Name: N/A  . Number of Children: N/A  . Years of Education: N/A   Social History Main Topics  . Smoking status: Former Smoker -- 2.00 packs/day for 25 years    Types: Cigarettes    Quit date: 07/29/1980  . Smokeless tobacco: Never Used  . Alcohol Use: 1.1 oz/week    1 Glasses of wine, 1 Standard drinks or equivalent per week     Comment:  "wine or whiskey" occasionally  . Drug Use: No  . Sexual Activity: No   Other Topics Concern  . None   Social History Narrative   He is a married father of 2, grandfather of 28. He is not really getting a lot of exercise now. He previously had been working out on a treadmill at least 2 times a day for 15 minutes at a time, but he   is not able to do that as much now because his knee hurts him too bad. Does not smoke and only occasional alcoholic beverage.   family history includes Emphysema in his father; Lung cancer in his paternal uncle. There is no history of Colon cancer.   Wt Readings from Last 3 Encounters:  11/16/15 267 lb (121.11 kg)  08/03/15 260 lb (117.935 kg)  05/23/15 261 lb 6.4 oz (118.57 kg)    PHYSICAL EXAM BP 160/80 mmHg  Pulse 83  Ht 6' (1.829 m)  Wt 267 lb (121.11 kg)  BMI  36.20 kg/m2 General, he is a pleasant, healthy-appearing gentleman who is mildly to moderately obese; No  acute distress, alert and oriented x3, answers questions appropriately.  HEENT: NCAT. EOMI. MMM. Anicteric sclerae.  Neck: Supple. No LAN, JVD, or carotid bruit.  Heart: RRR. Normal S1, S2. He has a 1/6 crescendo-decrescendo SEM heard at the right upper sternal border consistent with aortic sclerosis. No R/G.  Lungs: CTAB. Nonlabored. Normal respiratory effort. Good air movement. No wheezes, rales, rhonchi.  Abdomen: Obese, but otherwise soft, NT, ND. NABS. No HSM.  Extremities: Maybe 1+ lower extremity edema, but 2+ equal pulses throughout. He does have mild venous stasis dermatitis changes. Neuro: Grossly intact.    Adult ECG Report  Rate: 83 ;  Rhythm: normal sinus rhythm and Nonspecific ST-T wave changes. Suspect lead reversal with V2 and V3.;   Narrative Interpretation: Stable EKG otherwise   Other studies Reviewed: Additional studies/ records that were reviewed today include:  Recent Labs:   Lab Results  Component Value Date   CHOL 155 06/01/2014   HDL 35* 06/01/2014   LDLCALC 99 06/01/2014   TRIG 107 06/01/2014   CHOLHDL 4.4 06/01/2014    ASSESSMENT / PLAN: Problem List Items Addressed This Visit    Venous stasis of both lower extremities (Chronic)    Stable edema. No other heart failure symptoms. Intermittently using support stockings.      Obesity (BMI 30.0-34.9) (Chronic)    He was doing pretty well until the wintertime when he hasn't been able to do his routine level activity. We'll need to watch his dietary intake..      Insomnia due to stress (Chronic)    As he is in the process of switching primary for per care providers. I then continued to help him with his Halcion. There is been some issues with his insurance company about this, but he is tried several other agents and this is the one that is ineffective. I will refill his prescription today.      Hyperlipemia, intolerant to statins. - Primary (Chronic)    Has not had a lipid check in a while. We will  go ahead and order his lipids as well as chemistries A1c and TSH. He asked that that since we are checking his blood week and check his PSA since he has not yet seen at PCP. He seems to be doing okay on 10 mg Zocor. If repeat labs indicate that his levels are still not at goal, will refer back to lipid clinic to consider PCSK9 inhibitor.      Relevant Orders   EKG 12-Lead   Lipid Profile   Essential hypertension (Chronic)    He indicates that he has not taken his blood pressure medication yet today. We will follow-up on this at next visit.  Plan per last visit was to increase his amlodipine to 10 mg. He is currently only taken 5. He says he did not take his blood pressure medication today. If pressure continues to be elevated we will increase to 10.      Diabetes mellitus type 2, controlled, with complications ([place]) (Chronic)   Relevant Orders   HgB A1c   Chronic stable angina (HCC) - negative Myoview & no new Dz on Cath (Chronic)    Doing well with no major symptoms. He cannot read her the last time to nitroglycerin. He is taking 60 of Imdur. But we could potentially increase his amlodipine for more antianginal effect if necessary. For now continue current regimen.  CAD S/P percutaneous coronary angioplasty: 08/07/11 cutting balloon PTCA to 95% ostial diag. and 90%  proximal LCX.  prior cutting balloon to RCA  & ostium of ostial diag., 2009  (Chronic)    Stable. No active anginal symptoms, unlike the last visit. He has not had to use any nitroglycerin. He is on stable dose of ARB, beta blocker and calcium channel blocker. Limited beta blocker dosing due to fatigue and bradycardia in the past. He is tolerating low-dose statin.       Other Visit Diagnoses    Medication management        Relevant Orders    COMPLETE METABOLIC PANEL WITH GFR    TSH    CBC    Disorder of prostate        Relevant Orders    PSA    Other fatigue        Relevant Orders    TSH       Current  medicines are reviewed at length with the patient today. (+/- concerns) None  The following changes have been made: None   Studies Ordered:   Orders Placed This Encounter  Procedures  . COMPLETE METABOLIC PANEL WITH GFR  . TSH  . Lipid Profile  . CBC  . HgB A1c  . PSA  . EKG 12-Lead   Follow-up 6 months   HARDING, Leonie Green, M.D., M.S. Interventional Cardiologist   Pager # [phone] Phone # [phone] [address] [place], [place] [address]

## 2015-11-17 ENCOUNTER — Telehealth: Payer: Self-pay

## 2015-11-17 NOTE — Telephone Encounter (Signed)
Prior auth for Triazolam 0.25mg  sent to [facility].

## 2015-11-18 ENCOUNTER — Encounter: Payer: Self-pay | Admitting: Cardiology

## 2015-11-18 NOTE — Assessment & Plan Note (Signed)
As he is in the process of switching primary for per care providers. I then continued to help him with his Halcion. There is been some issues with his insurance company about this, but he is tried several other agents and this is the one that is ineffective. I will refill his prescription today.

## 2015-11-18 NOTE — Assessment & Plan Note (Signed)
He indicates that he has not taken his blood pressure medication yet today. We will follow-up on this at next visit.  Plan per last visit was to increase his amlodipine to 10 mg. He is currently only taken 5. He says he did not take his blood pressure medication today. If pressure continues to be elevated we will increase to 10.

## 2015-11-18 NOTE — Assessment & Plan Note (Signed)
Doing well with no major symptoms. He cannot read her the last time to nitroglycerin. He is taking 60 of Imdur. But we could potentially increase his amlodipine for more antianginal effect if necessary. For now continue current regimen.

## 2015-11-18 NOTE — Assessment & Plan Note (Signed)
Stable edema. No other heart failure symptoms. Intermittently using support stockings.

## 2015-11-18 NOTE — Assessment & Plan Note (Signed)
Has not had a lipid check in a while. We will go ahead and order his lipids as well as chemistries A1c and TSH. He asked that that since we are checking his blood week and check his PSA since he has not yet seen at PCP. He seems to be doing okay on 10 mg Zocor. If repeat labs indicate that his levels are still not at goal, will refer back to lipid clinic to consider PCSK9 inhibitor.

## 2015-11-18 NOTE — Assessment & Plan Note (Addendum)
Stable. No active anginal symptoms, unlike the last visit. He has not had to use any nitroglycerin. He is on stable dose of ARB, beta blocker and calcium channel blocker. Limited beta blocker dosing due to fatigue and bradycardia in the past. He is tolerating low-dose statin.

## 2015-11-18 NOTE — Assessment & Plan Note (Signed)
He was doing pretty well until the wintertime when he hasn't been able to do his routine level activity. We'll need to watch his dietary intake.Marland Kitchen

## 2015-11-21 ENCOUNTER — Telehealth: Payer: Self-pay

## 2015-11-21 NOTE — Telephone Encounter (Signed)
Triazolam approved through 11/16/2016.

## 2015-11-24 DIAGNOSIS — E118 Type 2 diabetes mellitus with unspecified complications: Secondary | ICD-10-CM | POA: Diagnosis not present

## 2015-11-24 DIAGNOSIS — Z79899 Other long term (current) drug therapy: Secondary | ICD-10-CM | POA: Diagnosis not present

## 2015-11-24 DIAGNOSIS — R5383 Other fatigue: Secondary | ICD-10-CM | POA: Diagnosis not present

## 2015-11-24 DIAGNOSIS — E785 Hyperlipidemia, unspecified: Secondary | ICD-10-CM | POA: Diagnosis not present

## 2015-11-24 DIAGNOSIS — N429 Disorder of prostate, unspecified: Secondary | ICD-10-CM | POA: Diagnosis not present

## 2015-11-24 LAB — CBC
HEMATOCRIT: 44.2 % (ref 38.5–50.0)
HEMOGLOBIN: 14.9 g/dL (ref 13.2–17.1)
MCH: 31.2 pg (ref 27.0–33.0)
MCHC: 33.7 g/dL (ref 32.0–36.0)
MCV: 92.5 fL (ref 80.0–100.0)
MPV: 10.8 fL (ref 7.5–12.5)
Platelets: 162 10*3/uL (ref 140–400)
RBC: 4.78 MIL/uL (ref 4.20–5.80)
RDW: 13.4 % (ref 11.0–15.0)
WBC: 3.8 10*3/uL (ref 3.8–10.8)

## 2015-11-24 LAB — HEMOGLOBIN A1C
HEMOGLOBIN A1C: 7.5 % — AB (ref <5.7)
MEAN PLASMA GLUCOSE: 169 mg/dL

## 2015-11-25 LAB — COMPLETE METABOLIC PANEL WITH GFR
ALBUMIN: 4 g/dL (ref 3.6–5.1)
ALK PHOS: 64 U/L (ref 40–115)
ALT: 35 U/L (ref 9–46)
AST: 29 U/L (ref 10–35)
BILIRUBIN TOTAL: 0.6 mg/dL (ref 0.2–1.2)
BUN: 12 mg/dL (ref 7–25)
CALCIUM: 8.9 mg/dL (ref 8.6–10.3)
CO2: 30 mmol/L (ref 20–31)
CREATININE: 0.93 mg/dL (ref 0.70–1.18)
Chloride: 101 mmol/L (ref 98–110)
GFR, Est African American: >89 mL/min (ref >=60)
GFR, Est Non African American: 82 mL/min (ref >=60)
Glucose, Bld: 133 mg/dL — ABNORMAL HIGH (ref 65–99)
POTASSIUM: 4.2 mmol/L (ref 3.5–5.3)
Sodium: 142 mmol/L (ref 135–146)
TOTAL PROTEIN: 6.6 g/dL (ref 6.1–8.1)

## 2015-11-25 LAB — TSH: TSH: 2.14 m[IU]/L (ref 0.40–4.50)

## 2015-11-25 LAB — LIPID PANEL
Cholesterol: 152 mg/dL (ref 125–200)
HDL: 36 mg/dL — AB (ref >=40)
LDL CALC: 93 mg/dL (ref <130)
Total CHOL/HDL Ratio: 4.2 Ratio (ref <=5.0)
Triglycerides: 115 mg/dL (ref <150)
VLDL: 23 mg/dL (ref <30)

## 2015-11-25 LAB — PSA: PSA: 3.98 ng/mL (ref <=4.00)

## 2015-11-28 ENCOUNTER — Telehealth: Payer: Self-pay | Admitting: Pharmacist

## 2015-11-28 DIAGNOSIS — E785 Hyperlipidemia, unspecified: Secondary | ICD-10-CM

## 2015-11-28 MED ORDER — EZETIMIBE 10 MG PO TABS: 10.0000 mg | ORAL_TABLET | Freq: Every day | ORAL | Status: DC

## 2015-11-28 NOTE — Telephone Encounter (Signed)
Patient returning call about lipid profile. He is unwilling to be scheduled in lipid clinic. His recent lipid profile showed LDL of 93 which is not at goal <70. Will send prescription for zetia to pharmacy and have him take daily. Will recheck labs in 2 months.

## 2015-11-28 NOTE — Progress Notes (Signed)
LMOM for patient to call and schedule lipid visit

## 2015-12-01 ENCOUNTER — Other Ambulatory Visit: Payer: Self-pay | Admitting: Cardiology

## 2015-12-01 ENCOUNTER — Encounter (HOSPITAL_COMMUNITY)
Admission: RE | Admit: 2015-12-01 | Discharge: 2015-12-01 | Disposition: A | Discharge status: Home / Self Care | Payer: Medicare Other | Source: Ambulatory Visit | Attending: Internal Medicine | Admitting: Internal Medicine

## 2015-12-01 DIAGNOSIS — Z452 Encounter for adjustment and management of vascular access device: Secondary | ICD-10-CM | POA: Insufficient documentation

## 2015-12-01 MED ORDER — HEPARIN SOD (PORK) LOCK FLUSH 100 UNIT/ML IV SOLN
INTRAVENOUS | Status: AC
Start: 2015-12-01 — End: 2015-12-01
  Filled 2015-12-01: qty 5

## 2015-12-01 MED ORDER — SODIUM CHLORIDE 0.9% FLUSH
10.0000 mL | INTRAVENOUS | Status: AC | PRN
  Administered 2015-12-01: 10 mL

## 2015-12-01 MED ORDER — HEPARIN SOD (PORK) LOCK FLUSH 100 UNIT/ML IV SOLN
500.0000 [IU] | INTRAVENOUS | Status: AC | PRN
  Administered 2015-12-01: 500 [IU]

## 2015-12-01 NOTE — Telephone Encounter (Signed)
Rx Refill

## 2015-12-04 ENCOUNTER — Telehealth: Payer: Self-pay | Admitting: Pharmacist Clinician (PhC)/ Clinical Pharmacy Specialist

## 2015-12-04 NOTE — Telephone Encounter (Signed)
Patient called, states has been on ezetimibe x 1 week and feels very low energy.  Has previously tolerated simvastatin 40 mg, but switched as his numbers were not getting better.   Asked him to continue Zetia for another week, to see if side effect would go away, and if not he can stop and restart the simvastatin.  Patient voiced understanding, will call and let us know in another week.

## 2015-12-05 DIAGNOSIS — G4700 Insomnia, unspecified: Secondary | ICD-10-CM | POA: Diagnosis not present

## 2015-12-05 DIAGNOSIS — Z1389 Encounter for screening for other disorder: Secondary | ICD-10-CM | POA: Diagnosis not present

## 2015-12-05 DIAGNOSIS — M25551 Pain in right hip: Secondary | ICD-10-CM | POA: Diagnosis not present

## 2015-12-05 DIAGNOSIS — R5383 Other fatigue: Secondary | ICD-10-CM | POA: Diagnosis not present

## 2015-12-05 DIAGNOSIS — Z6837 Body mass index (BMI) 37.0-37.9, adult: Secondary | ICD-10-CM | POA: Diagnosis not present

## 2015-12-11 DIAGNOSIS — E1151 Type 2 diabetes mellitus with diabetic peripheral angiopathy without gangrene: Secondary | ICD-10-CM | POA: Diagnosis not present

## 2015-12-11 DIAGNOSIS — M79671 Pain in right foot: Secondary | ICD-10-CM | POA: Diagnosis not present

## 2015-12-11 DIAGNOSIS — E1140 Type 2 diabetes mellitus with diabetic neuropathy, unspecified: Secondary | ICD-10-CM | POA: Diagnosis not present

## 2015-12-11 DIAGNOSIS — M79672 Pain in left foot: Secondary | ICD-10-CM | POA: Diagnosis not present

## 2015-12-13 DIAGNOSIS — H35352 Cystoid macular degeneration, left eye: Secondary | ICD-10-CM | POA: Diagnosis not present

## 2015-12-26 ENCOUNTER — Other Ambulatory Visit: Payer: Self-pay | Admitting: Cardiology

## 2015-12-27 NOTE — Telephone Encounter (Signed)
Rx(s) sent to pharmacy electronically.  

## 2016-01-03 DIAGNOSIS — M1991 Primary osteoarthritis, unspecified site: Secondary | ICD-10-CM | POA: Diagnosis not present

## 2016-01-03 DIAGNOSIS — E1129 Type 2 diabetes mellitus with other diabetic kidney complication: Secondary | ICD-10-CM | POA: Diagnosis not present

## 2016-01-03 DIAGNOSIS — N400 Enlarged prostate without lower urinary tract symptoms: Secondary | ICD-10-CM | POA: Diagnosis not present

## 2016-01-03 DIAGNOSIS — Z6836 Body mass index (BMI) 36.0-36.9, adult: Secondary | ICD-10-CM | POA: Diagnosis not present

## 2016-01-03 DIAGNOSIS — Z1389 Encounter for screening for other disorder: Secondary | ICD-10-CM | POA: Diagnosis not present

## 2016-01-03 DIAGNOSIS — M47817 Spondylosis without myelopathy or radiculopathy, lumbosacral region: Secondary | ICD-10-CM | POA: Diagnosis not present

## 2016-01-03 DIAGNOSIS — C671 Malignant neoplasm of dome of bladder: Secondary | ICD-10-CM | POA: Diagnosis not present

## 2016-01-03 DIAGNOSIS — H3530 Unspecified macular degeneration: Secondary | ICD-10-CM | POA: Diagnosis not present

## 2016-01-09 ENCOUNTER — Other Ambulatory Visit: Payer: Self-pay | Admitting: *Deleted

## 2016-01-09 MED ORDER — ISOSORBIDE MONONITRATE ER 60 MG PO TB24: 60.0000 mg | ORAL_TABLET | Freq: Every day | ORAL | Status: DC

## 2016-01-09 NOTE — Telephone Encounter (Signed)
Rx request sent to pharmacy.  

## 2016-01-11 ENCOUNTER — Other Ambulatory Visit: Payer: Self-pay | Admitting: *Deleted

## 2016-01-11 MED ORDER — ISOSORBIDE MONONITRATE ER 60 MG PO TB24: 60.0000 mg | ORAL_TABLET | Freq: Every day | ORAL | Status: DC

## 2016-01-12 ENCOUNTER — Other Ambulatory Visit: Payer: Self-pay

## 2016-01-12 MED ORDER — ISOSORBIDE MONONITRATE ER 60 MG PO TB24: 60.0000 mg | ORAL_TABLET | Freq: Every day | ORAL | Status: DC

## 2016-01-12 NOTE — Telephone Encounter (Signed)
Rx Refill

## 2016-01-15 DIAGNOSIS — H353231 Exudative age-related macular degeneration, bilateral, with active choroidal neovascularization: Secondary | ICD-10-CM | POA: Diagnosis not present

## 2016-01-15 DIAGNOSIS — H35353 Cystoid macular degeneration, bilateral: Secondary | ICD-10-CM | POA: Diagnosis not present

## 2016-01-15 LAB — HM DIABETES EYE EXAM

## 2016-01-23 ENCOUNTER — Telehealth: Payer: Self-pay | Admitting: Pharmacist

## 2016-01-23 NOTE — Telephone Encounter (Signed)
Called patient about Zetia. He states he is back on simvastatin. He stopped the Zetia about a week after speaking with Erasmo Downer (around 12/11/15) and restarted the simvastatin the week following this. Will order defer lipid panel until patient on simvastatin for 3 months.

## 2016-02-16 DIAGNOSIS — M4696 Unspecified inflammatory spondylopathy, lumbar region: Secondary | ICD-10-CM | POA: Diagnosis not present

## 2016-02-16 DIAGNOSIS — M545 Low back pain: Secondary | ICD-10-CM | POA: Diagnosis not present

## 2016-02-19 DIAGNOSIS — E1151 Type 2 diabetes mellitus with diabetic peripheral angiopathy without gangrene: Secondary | ICD-10-CM | POA: Diagnosis not present

## 2016-02-19 DIAGNOSIS — E1140 Type 2 diabetes mellitus with diabetic neuropathy, unspecified: Secondary | ICD-10-CM | POA: Diagnosis not present

## 2016-02-26 DIAGNOSIS — H353231 Exudative age-related macular degeneration, bilateral, with active choroidal neovascularization: Secondary | ICD-10-CM | POA: Diagnosis not present

## 2016-02-26 DIAGNOSIS — H35353 Cystoid macular degeneration, bilateral: Secondary | ICD-10-CM | POA: Diagnosis not present

## 2016-02-27 DIAGNOSIS — L409 Psoriasis, unspecified: Secondary | ICD-10-CM | POA: Diagnosis not present

## 2016-02-27 DIAGNOSIS — Z85820 Personal history of malignant melanoma of skin: Secondary | ICD-10-CM | POA: Diagnosis not present

## 2016-02-27 DIAGNOSIS — Z85828 Personal history of other malignant neoplasm of skin: Secondary | ICD-10-CM | POA: Diagnosis not present

## 2016-02-27 DIAGNOSIS — D225 Melanocytic nevi of trunk: Secondary | ICD-10-CM | POA: Diagnosis not present

## 2016-02-27 DIAGNOSIS — L820 Inflamed seborrheic keratosis: Secondary | ICD-10-CM | POA: Diagnosis not present

## 2016-03-04 ENCOUNTER — Encounter: Payer: Self-pay | Admitting: Cardiology

## 2016-03-04 DIAGNOSIS — H353231 Exudative age-related macular degeneration, bilateral, with active choroidal neovascularization: Secondary | ICD-10-CM | POA: Diagnosis not present

## 2016-03-04 DIAGNOSIS — H3092 Unspecified chorioretinal inflammation, left eye: Secondary | ICD-10-CM | POA: Diagnosis not present

## 2016-03-20 ENCOUNTER — Other Ambulatory Visit: Payer: Self-pay | Admitting: Cardiology

## 2016-03-20 DIAGNOSIS — E785 Hyperlipidemia, unspecified: Secondary | ICD-10-CM | POA: Diagnosis not present

## 2016-03-20 LAB — HEPATIC FUNCTION PANEL
ALT: 19 U/L (ref 9–46)
AST: 15 U/L (ref 10–35)
Albumin: 3.9 g/dL (ref 3.6–5.1)
Alkaline Phosphatase: 58 U/L (ref 40–115)
BILIRUBIN DIRECT: 0.1 mg/dL (ref <=0.2)
BILIRUBIN INDIRECT: 0.5 mg/dL (ref 0.2–1.2)
BILIRUBIN TOTAL: 0.6 mg/dL (ref 0.2–1.2)
TOTAL PROTEIN: 6.2 g/dL (ref 6.1–8.1)

## 2016-03-20 LAB — LIPID PANEL
Cholesterol: 150 mg/dL (ref 125–200)
HDL: 41 mg/dL (ref >=40)
LDL CALC: 86 mg/dL (ref <130)
Total CHOL/HDL Ratio: 3.7 Ratio (ref <=5.0)
Triglycerides: 116 mg/dL (ref <150)
VLDL: 23 mg/dL (ref <30)

## 2016-03-26 ENCOUNTER — Telehealth: Payer: Self-pay | Admitting: *Deleted

## 2016-03-26 DIAGNOSIS — H524 Presbyopia: Secondary | ICD-10-CM | POA: Diagnosis not present

## 2016-03-26 NOTE — Telephone Encounter (Signed)
Left  Detail message of lab results on  Secure patient phone. Any question may call back for detail.

## 2016-03-26 NOTE — Telephone Encounter (Signed)
-----   Message from Leonie Man, MD sent at 03/21/2016  1:24 PM EDT ----- Lipid panel looks good. Stable from 3 months ago, but the LDL has improved some. HDL has also increased.  For now will continue current treatment.  Please forward to PCP, Dr. Gerarda Fraction

## 2016-04-08 DIAGNOSIS — H35353 Cystoid macular degeneration, bilateral: Secondary | ICD-10-CM | POA: Diagnosis not present

## 2016-04-29 DIAGNOSIS — E1151 Type 2 diabetes mellitus with diabetic peripheral angiopathy without gangrene: Secondary | ICD-10-CM | POA: Diagnosis not present

## 2016-04-29 DIAGNOSIS — E1140 Type 2 diabetes mellitus with diabetic neuropathy, unspecified: Secondary | ICD-10-CM | POA: Diagnosis not present

## 2016-05-01 DIAGNOSIS — H25812 Combined forms of age-related cataract, left eye: Secondary | ICD-10-CM | POA: Diagnosis not present

## 2016-05-01 DIAGNOSIS — H353221 Exudative age-related macular degeneration, left eye, with active choroidal neovascularization: Secondary | ICD-10-CM | POA: Diagnosis not present

## 2016-05-01 DIAGNOSIS — H353213 Exudative age-related macular degeneration, right eye, with inactive scar: Secondary | ICD-10-CM | POA: Diagnosis not present

## 2016-05-12 ENCOUNTER — Other Ambulatory Visit: Payer: Self-pay | Admitting: Cardiology

## 2016-05-14 ENCOUNTER — Encounter (HOSPITAL_COMMUNITY): Payer: Medicare Other

## 2016-05-15 ENCOUNTER — Encounter (HOSPITAL_COMMUNITY)
Admission: RE | Admit: 2016-05-15 | Discharge: 2016-05-15 | Disposition: A | Discharge status: Home / Self Care | Payer: Medicare Other | Source: Ambulatory Visit | Attending: Internal Medicine | Admitting: Internal Medicine

## 2016-05-15 DIAGNOSIS — Z452 Encounter for adjustment and management of vascular access device: Secondary | ICD-10-CM | POA: Diagnosis not present

## 2016-05-15 MED ORDER — HEPARIN SOD (PORK) LOCK FLUSH 100 UNIT/ML IV SOLN
500.0000 [IU] | Freq: Once | INTRAVENOUS | Status: AC
  Administered 2016-05-15: 500 [IU]

## 2016-05-15 MED ORDER — HEPARIN SOD (PORK) LOCK FLUSH 100 UNIT/ML IV SOLN
INTRAVENOUS | Status: AC
  Filled 2016-05-15: qty 5

## 2016-05-15 MED ORDER — SODIUM CHLORIDE 0.9% FLUSH
10.0000 mL | INTRAVENOUS | Status: AC | PRN
  Administered 2016-05-15: 10 mL

## 2016-05-17 DIAGNOSIS — H35722 Serous detachment of retinal pigment epithelium, left eye: Secondary | ICD-10-CM | POA: Diagnosis not present

## 2016-05-17 DIAGNOSIS — H35353 Cystoid macular degeneration, bilateral: Secondary | ICD-10-CM | POA: Diagnosis not present

## 2016-05-17 DIAGNOSIS — H353231 Exudative age-related macular degeneration, bilateral, with active choroidal neovascularization: Secondary | ICD-10-CM | POA: Diagnosis not present

## 2016-05-17 DIAGNOSIS — H3092 Unspecified chorioretinal inflammation, left eye: Secondary | ICD-10-CM | POA: Diagnosis not present

## 2016-05-17 DIAGNOSIS — E119 Type 2 diabetes mellitus without complications: Secondary | ICD-10-CM | POA: Diagnosis not present

## 2016-05-27 DIAGNOSIS — N138 Other obstructive and reflux uropathy: Secondary | ICD-10-CM | POA: Diagnosis not present

## 2016-05-27 DIAGNOSIS — Z8551 Personal history of malignant neoplasm of bladder: Secondary | ICD-10-CM | POA: Diagnosis not present

## 2016-05-27 DIAGNOSIS — R109 Unspecified abdominal pain: Secondary | ICD-10-CM | POA: Diagnosis not present

## 2016-05-27 DIAGNOSIS — N401 Enlarged prostate with lower urinary tract symptoms: Secondary | ICD-10-CM | POA: Diagnosis not present

## 2016-05-27 DIAGNOSIS — R972 Elevated prostate specific antigen [PSA]: Secondary | ICD-10-CM | POA: Diagnosis not present

## 2016-06-03 DIAGNOSIS — H353231 Exudative age-related macular degeneration, bilateral, with active choroidal neovascularization: Secondary | ICD-10-CM | POA: Diagnosis not present

## 2016-06-03 LAB — HM DIABETES EYE EXAM

## 2016-06-07 ENCOUNTER — Other Ambulatory Visit: Payer: Self-pay | Admitting: Cardiology

## 2016-06-08 ENCOUNTER — Telehealth: Payer: Self-pay | Admitting: Cardiology

## 2016-06-11 DIAGNOSIS — E119 Type 2 diabetes mellitus without complications: Secondary | ICD-10-CM | POA: Diagnosis not present

## 2016-06-11 DIAGNOSIS — Z0000 Encounter for general adult medical examination without abnormal findings: Secondary | ICD-10-CM | POA: Diagnosis not present

## 2016-06-11 DIAGNOSIS — I10 Essential (primary) hypertension: Secondary | ICD-10-CM | POA: Diagnosis not present

## 2016-06-11 DIAGNOSIS — Z6837 Body mass index (BMI) 37.0-37.9, adult: Secondary | ICD-10-CM | POA: Diagnosis not present

## 2016-06-11 DIAGNOSIS — E782 Mixed hyperlipidemia: Secondary | ICD-10-CM | POA: Diagnosis not present

## 2016-06-14 NOTE — Telephone Encounter (Signed)
New message  Pt is still waiting for authorization to pharmacy for triazolam 0.25mg  1-2 tab  Pharmacy has not received authorization from Dr. Allison Quarry office  Arthur Lutz

## 2016-06-14 NOTE — Telephone Encounter (Signed)
Ok per Dr Ellyn Hack, Rx has been call to the pharmacy

## 2016-07-08 DIAGNOSIS — H35353 Cystoid macular degeneration, bilateral: Secondary | ICD-10-CM | POA: Diagnosis not present

## 2016-07-08 DIAGNOSIS — H353231 Exudative age-related macular degeneration, bilateral, with active choroidal neovascularization: Secondary | ICD-10-CM | POA: Diagnosis not present

## 2016-07-16 DIAGNOSIS — E1151 Type 2 diabetes mellitus with diabetic peripheral angiopathy without gangrene: Secondary | ICD-10-CM | POA: Diagnosis not present

## 2016-07-16 DIAGNOSIS — E1140 Type 2 diabetes mellitus with diabetic neuropathy, unspecified: Secondary | ICD-10-CM | POA: Diagnosis not present

## 2016-07-18 ENCOUNTER — Telehealth: Payer: Self-pay | Admitting: *Deleted

## 2016-07-18 NOTE — Telephone Encounter (Signed)
I guess some stuck refilling this for him, because his new PCPs will not do it -- apparently Dr. Rex Kras was the one who started it.  Glenetta Hew, MD

## 2016-07-18 NOTE — Telephone Encounter (Signed)
Per routing note: Dr Ellyn Hack gave this pt a RX for halicon, he wants a refill, thanks  If Dr Ellyn Hack will not fill please call pt as his request so he knows, thanks  (Routing comment)   Would you like to refill?

## 2016-07-19 MED ORDER — TRIAZOLAM 0.25 MG PO TABS: ORAL_TABLET | ORAL | 0 refills | Status: DC

## 2016-07-19 NOTE — Telephone Encounter (Signed)
Called in.

## 2016-07-19 NOTE — Telephone Encounter (Signed)
Refill sent as requested. 

## 2016-07-19 NOTE — Addendum Note (Signed)
Addended by: Waylan Rocher on: 07/19/2016 10:07 AM   Modules accepted: Orders

## 2016-08-09 DIAGNOSIS — N401 Enlarged prostate with lower urinary tract symptoms: Secondary | ICD-10-CM | POA: Diagnosis not present

## 2016-08-09 DIAGNOSIS — R972 Elevated prostate specific antigen [PSA]: Secondary | ICD-10-CM | POA: Diagnosis not present

## 2016-08-09 DIAGNOSIS — C672 Malignant neoplasm of lateral wall of bladder: Secondary | ICD-10-CM | POA: Diagnosis not present

## 2016-08-09 DIAGNOSIS — N138 Other obstructive and reflux uropathy: Secondary | ICD-10-CM | POA: Diagnosis not present

## 2016-08-12 DIAGNOSIS — H353211 Exudative age-related macular degeneration, right eye, with active choroidal neovascularization: Secondary | ICD-10-CM | POA: Diagnosis not present

## 2016-08-13 ENCOUNTER — Other Ambulatory Visit: Payer: Self-pay | Admitting: Cardiology

## 2016-08-13 NOTE — Telephone Encounter (Signed)
Rx(s) sent to pharmacy electronically.  

## 2016-08-28 DIAGNOSIS — H353221 Exudative age-related macular degeneration, left eye, with active choroidal neovascularization: Secondary | ICD-10-CM | POA: Diagnosis not present

## 2016-08-28 DIAGNOSIS — H35422 Microcystoid degeneration of retina, left eye: Secondary | ICD-10-CM | POA: Diagnosis not present

## 2016-08-28 DIAGNOSIS — H31091 Other chorioretinal scars, right eye: Secondary | ICD-10-CM | POA: Diagnosis not present

## 2016-08-28 DIAGNOSIS — H43813 Vitreous degeneration, bilateral: Secondary | ICD-10-CM | POA: Diagnosis not present

## 2016-09-01 ENCOUNTER — Other Ambulatory Visit: Payer: Self-pay | Admitting: Cardiology

## 2016-09-04 DIAGNOSIS — D225 Melanocytic nevi of trunk: Secondary | ICD-10-CM | POA: Diagnosis not present

## 2016-09-04 DIAGNOSIS — Z85820 Personal history of malignant melanoma of skin: Secondary | ICD-10-CM | POA: Diagnosis not present

## 2016-09-04 DIAGNOSIS — Z85828 Personal history of other malignant neoplasm of skin: Secondary | ICD-10-CM | POA: Diagnosis not present

## 2016-09-04 DIAGNOSIS — L820 Inflamed seborrheic keratosis: Secondary | ICD-10-CM | POA: Diagnosis not present

## 2016-09-04 NOTE — Telephone Encounter (Signed)
CALLED TOTAL OF 2 REFILLS INTO PHARMACY

## 2016-09-25 DIAGNOSIS — H31091 Other chorioretinal scars, right eye: Secondary | ICD-10-CM | POA: Diagnosis not present

## 2016-09-25 DIAGNOSIS — H353221 Exudative age-related macular degeneration, left eye, with active choroidal neovascularization: Secondary | ICD-10-CM | POA: Diagnosis not present

## 2016-09-25 DIAGNOSIS — H353213 Exudative age-related macular degeneration, right eye, with inactive scar: Secondary | ICD-10-CM | POA: Diagnosis not present

## 2016-09-25 DIAGNOSIS — H35422 Microcystoid degeneration of retina, left eye: Secondary | ICD-10-CM | POA: Diagnosis not present

## 2016-09-28 ENCOUNTER — Other Ambulatory Visit: Payer: Self-pay | Admitting: Cardiology

## 2016-09-30 ENCOUNTER — Other Ambulatory Visit: Payer: Self-pay

## 2016-09-30 MED ORDER — LOSARTAN POTASSIUM-HCTZ 100-25 MG PO TABS: 0.5000 | ORAL_TABLET | Freq: Every day | ORAL | 1 refills | Status: DC

## 2016-10-01 DIAGNOSIS — E1151 Type 2 diabetes mellitus with diabetic peripheral angiopathy without gangrene: Secondary | ICD-10-CM | POA: Diagnosis not present

## 2016-10-01 DIAGNOSIS — E1140 Type 2 diabetes mellitus with diabetic neuropathy, unspecified: Secondary | ICD-10-CM | POA: Diagnosis not present

## 2016-10-18 ENCOUNTER — Other Ambulatory Visit: Payer: Self-pay | Admitting: Cardiology

## 2016-10-23 DIAGNOSIS — Z6836 Body mass index (BMI) 36.0-36.9, adult: Secondary | ICD-10-CM | POA: Diagnosis not present

## 2016-10-23 DIAGNOSIS — H43813 Vitreous degeneration, bilateral: Secondary | ICD-10-CM | POA: Diagnosis not present

## 2016-10-23 DIAGNOSIS — H353213 Exudative age-related macular degeneration, right eye, with inactive scar: Secondary | ICD-10-CM | POA: Diagnosis not present

## 2016-10-23 DIAGNOSIS — R8290 Unspecified abnormal findings in urine: Secondary | ICD-10-CM | POA: Diagnosis not present

## 2016-10-23 DIAGNOSIS — H35422 Microcystoid degeneration of retina, left eye: Secondary | ICD-10-CM | POA: Diagnosis not present

## 2016-10-23 DIAGNOSIS — I2510 Atherosclerotic heart disease of native coronary artery without angina pectoris: Secondary | ICD-10-CM | POA: Diagnosis not present

## 2016-10-23 DIAGNOSIS — H353221 Exudative age-related macular degeneration, left eye, with active choroidal neovascularization: Secondary | ICD-10-CM | POA: Diagnosis not present

## 2016-10-23 DIAGNOSIS — E119 Type 2 diabetes mellitus without complications: Secondary | ICD-10-CM | POA: Diagnosis not present

## 2016-10-24 ENCOUNTER — Encounter (HOSPITAL_COMMUNITY): Payer: Medicare Other | Attending: Internal Medicine

## 2016-10-25 ENCOUNTER — Telehealth: Payer: Self-pay | Admitting: Cardiology

## 2016-10-25 NOTE — Telephone Encounter (Signed)
This is the CLEAR trial.  Bempedoic acid works in the liver to decrease LDL cholesterol.   Advantage over statins is that it cannot become active in muscle tissue and therefore no myalgias.  Would encourage him to join study.

## 2016-10-25 NOTE — Telephone Encounter (Signed)
°  New Prob   Pt states his PCP is advising him to start on a new cholesterol medication- Bempedoic Acid. Pt would like Dr. Ellyn Hack to review this medication before he starts. Please call.

## 2016-10-25 NOTE — Telephone Encounter (Signed)
Spoke to patient   patient states primary would like for patient  To start a "research study " including this medication .  patient wanted to get opoinion -  Patient states he can send e-mail in which company sent him if needed  patient aware awaiting for Dr Ellyn Hack 'S INPUT.

## 2016-10-28 ENCOUNTER — Encounter (HOSPITAL_COMMUNITY)
Admission: RE | Admit: 2016-10-28 | Discharge: 2016-10-28 | Disposition: A | Discharge status: Home / Self Care | Payer: Medicare Other | Source: Ambulatory Visit | Attending: Family Medicine | Admitting: Family Medicine

## 2016-10-28 ENCOUNTER — Encounter (HOSPITAL_COMMUNITY): Payer: Self-pay

## 2016-10-28 DIAGNOSIS — H353213 Exudative age-related macular degeneration, right eye, with inactive scar: Secondary | ICD-10-CM | POA: Diagnosis not present

## 2016-10-28 DIAGNOSIS — H25812 Combined forms of age-related cataract, left eye: Secondary | ICD-10-CM | POA: Diagnosis not present

## 2016-10-28 DIAGNOSIS — H353221 Exudative age-related macular degeneration, left eye, with active choroidal neovascularization: Secondary | ICD-10-CM | POA: Diagnosis not present

## 2016-10-28 DIAGNOSIS — E119 Type 2 diabetes mellitus without complications: Secondary | ICD-10-CM | POA: Diagnosis not present

## 2016-10-28 DIAGNOSIS — Z452 Encounter for adjustment and management of vascular access device: Secondary | ICD-10-CM | POA: Insufficient documentation

## 2016-10-28 MED ORDER — HEPARIN SOD (PORK) LOCK FLUSH 100 UNIT/ML IV SOLN
INTRAVENOUS | Status: AC
  Filled 2016-10-28: qty 5

## 2016-10-28 MED ORDER — SODIUM CHLORIDE 0.9% FLUSH
10.0000 mL | INTRAVENOUS | Status: AC | PRN
  Administered 2016-10-28: 10 mL

## 2016-10-28 MED ORDER — HEPARIN SOD (PORK) LOCK FLUSH 100 UNIT/ML IV SOLN
500.0000 [IU] | INTRAVENOUS | Status: AC | PRN
  Administered 2016-10-28: 500 [IU]

## 2016-10-28 NOTE — Progress Notes (Signed)
Arthur Lutz presented for Portacath access and flush. Portacath located left chest wall accessed with  20 gauge needle Portacath flushed with 10cc NS  and 500U/29ml Heparin per protocol and needle removed intact. Procedure without incident. Patient tolerated procedure well.

## 2016-10-29 NOTE — Telephone Encounter (Signed)
Pt.notified

## 2016-10-29 NOTE — Telephone Encounter (Signed)
Sounds OK to me.  Glenetta Hew, MD

## 2016-11-08 DIAGNOSIS — J029 Acute pharyngitis, unspecified: Secondary | ICD-10-CM | POA: Diagnosis not present

## 2016-11-08 DIAGNOSIS — Z6835 Body mass index (BMI) 35.0-35.9, adult: Secondary | ICD-10-CM | POA: Diagnosis not present

## 2016-11-08 DIAGNOSIS — E6609 Other obesity due to excess calories: Secondary | ICD-10-CM | POA: Diagnosis not present

## 2016-11-08 DIAGNOSIS — J069 Acute upper respiratory infection, unspecified: Secondary | ICD-10-CM | POA: Diagnosis not present

## 2016-11-13 DIAGNOSIS — H353221 Exudative age-related macular degeneration, left eye, with active choroidal neovascularization: Secondary | ICD-10-CM | POA: Diagnosis not present

## 2016-11-13 DIAGNOSIS — H353213 Exudative age-related macular degeneration, right eye, with inactive scar: Secondary | ICD-10-CM | POA: Diagnosis not present

## 2016-11-13 NOTE — Patient Instructions (Signed)
Your procedure is scheduled on: 11/21/2016  Report to [facility] at  1050   AM.  Call this number if you have problems the morning of surgery: [phone]   Do not eat food or drink liquids :After Midnight.      Take these medicines the morning of surgery with A SIP OF WATER: amlodipine, imdur, losartan, metoprolol, flomax.   Do not wear jewelry, make-up or nail polish.  Do not wear lotions, powders, or perfumes. You may wear deodorant.  Do not shave 48 hours prior to surgery.  Do not bring valuables to the hospital.  Contacts, dentures or bridgework may not be worn into surgery.  Leave suitcase in the car. After surgery it may be brought to your room.  For patients admitted to the hospital, checkout time is 11:00 AM the day of discharge.   Patients discharged the day of surgery will not be allowed to drive home.  :     Please read over the following fact sheets that you were given: Coughing and Deep Breathing, Surgical Site Infection Prevention, Anesthesia Post-op Instructions and Care and Recovery After Surgery    Cataract A cataract is a clouding of the lens of the eye. When a lens becomes cloudy, vision is reduced based on the degree and nature of the clouding. Many cataracts reduce vision to some degree. Some cataracts make people more near-sighted as they develop. Other cataracts increase glare. Cataracts that are ignored and become worse can sometimes look white. The white color can be seen through the pupil. CAUSES   Aging. However, cataracts may occur at any age, even in newborns.   Certain drugs.   Trauma to the eye.   Certain diseases such as diabetes.   Specific eye diseases such as chronic inflammation inside the eye or a sudden attack of a rare form of glaucoma.   Inherited or acquired medical problems.  SYMPTOMS   Gradual, progressive drop in vision in the affected eye.   Severe, rapid visual loss. This most often happens when trauma is the cause.  DIAGNOSIS    To detect a cataract, an eye doctor examines the lens. Cataracts are best diagnosed with an exam of the eyes with the pupils enlarged (dilated) by drops.  TREATMENT  For an early cataract, vision may improve by using different eyeglasses or stronger lighting. If that does not help your vision, surgery is the only effective treatment. A cataract needs to be surgically removed when vision loss interferes with your everyday activities, such as driving, reading, or watching TV. A cataract may also have to be removed if it prevents examination or treatment of another eye problem. Surgery removes the cloudy lens and usually replaces it with a substitute lens (intraocular lens, IOL).  At a time when both you and your doctor agree, the cataract will be surgically removed. If you have cataracts in both eyes, only one is usually removed at a time. This allows the operated eye to heal and be out of danger from any possible problems after surgery (such as infection or poor wound healing). In rare cases, a cataract may be doing damage to your eye. In these cases, your caregiver may advise surgical removal right away. The vast majority of people who have cataract surgery have better vision afterward. HOME CARE INSTRUCTIONS  If you are not planning surgery, you may be asked to do the following:  Use different eyeglasses.   Use stronger or brighter lighting.   Ask your eye doctor  about reducing your medicine dose or changing medicines if it is thought that a medicine caused your cataract. Changing medicines does not make the cataract go away on its own.   Become familiar with your surroundings. Poor vision can lead to injury. Avoid bumping into things on the affected side. You are at a higher risk for tripping or falling.   Exercise extreme care when driving or operating machinery.   Wear sunglasses if you are sensitive to bright light or experiencing problems with glare.  SEEK IMMEDIATE MEDICAL CARE IF:   You  have a worsening or sudden vision loss.   You notice redness, swelling, or increasing pain in the eye.   You have a fever.  Document Released: 07/15/2005 Document Revised: 07/04/2011 Document Reviewed: 03/08/2011 [facility] Patient Information 2012 [email or web address].PATIENT INSTRUCTIONS POST-ANESTHESIA  IMMEDIATELY FOLLOWING SURGERY:  Do not drive or operate machinery for the first twenty four hours after surgery.  Do not make any important decisions for twenty four hours after surgery or while taking narcotic pain medications or sedatives.  If you develop intractable nausea and vomiting or a severe headache please notify your doctor immediately.  FOLLOW-UP:  Please make an appointment with your surgeon as instructed. You do not need to follow up with anesthesia unless specifically instructed to do so.  WOUND CARE INSTRUCTIONS (if applicable):  Keep a dry clean dressing on the anesthesia/puncture wound site if there is drainage.  Once the wound has quit draining you may leave it open to air.  Generally you should leave the bandage intact for twenty four hours unless there is drainage.  If the epidural site drains for more than 36-48 hours please call the anesthesia department.  QUESTIONS?:  Please feel free to call your physician or the hospital operator if you have any questions, and they will be happy to assist you.

## 2016-11-15 ENCOUNTER — Encounter (HOSPITAL_COMMUNITY)
Admission: RE | Admit: 2016-11-15 | Discharge: 2016-11-15 | Disposition: A | Discharge status: Home / Self Care | Payer: Medicare Other | Source: Ambulatory Visit | Attending: Ophthalmology | Admitting: Ophthalmology

## 2016-11-15 ENCOUNTER — Encounter (HOSPITAL_COMMUNITY): Payer: Self-pay

## 2016-11-15 ENCOUNTER — Other Ambulatory Visit: Payer: Self-pay

## 2016-11-15 DIAGNOSIS — Z01812 Encounter for preprocedural laboratory examination: Secondary | ICD-10-CM | POA: Diagnosis not present

## 2016-11-15 DIAGNOSIS — Z01810 Encounter for preprocedural cardiovascular examination: Secondary | ICD-10-CM | POA: Diagnosis not present

## 2016-11-15 DIAGNOSIS — R001 Bradycardia, unspecified: Secondary | ICD-10-CM | POA: Insufficient documentation

## 2016-11-15 LAB — CBC WITH DIFFERENTIAL/PLATELET
Basophils Absolute: 0 10*3/uL (ref 0.0–0.1)
Basophils Relative: 1 %
EOS ABS: 0.1 10*3/uL (ref 0.0–0.7)
EOS PCT: 2 %
HCT: 43.3 % (ref 39.0–52.0)
Hemoglobin: 14.6 g/dL (ref 13.0–17.0)
LYMPHS ABS: 1.2 10*3/uL (ref 0.7–4.0)
Lymphocytes Relative: 20 %
MCH: 31.3 pg (ref 26.0–34.0)
MCHC: 33.7 g/dL (ref 30.0–36.0)
MCV: 92.9 fL (ref 78.0–100.0)
MONO ABS: 0.5 10*3/uL (ref 0.1–1.0)
MONOS PCT: 9 %
Neutro Abs: 4.2 10*3/uL (ref 1.7–7.7)
Neutrophils Relative %: 68 %
PLATELETS: 190 10*3/uL (ref 150–400)
RBC: 4.66 MIL/uL (ref 4.22–5.81)
RDW: 13.1 % (ref 11.5–15.5)
WBC: 6 10*3/uL (ref 4.0–10.5)

## 2016-11-15 LAB — BASIC METABOLIC PANEL
Anion gap: 8 (ref 5–15)
BUN: 18 mg/dL (ref 6–20)
CHLORIDE: 99 mmol/L — AB (ref 101–111)
CO2: 29 mmol/L (ref 22–32)
CREATININE: 0.92 mg/dL (ref 0.61–1.24)
Calcium: 9.1 mg/dL (ref 8.9–10.3)
GFR calc Af Amer: >60 mL/min (ref >60)
Glucose, Bld: 124 mg/dL — ABNORMAL HIGH (ref 65–99)
Potassium: 3.7 mmol/L (ref 3.5–5.1)
SODIUM: 136 mmol/L (ref 135–145)

## 2016-11-21 ENCOUNTER — Ambulatory Visit (HOSPITAL_COMMUNITY): Payer: Medicare Other | Admitting: Anesthesiology

## 2016-11-21 ENCOUNTER — Ambulatory Visit (HOSPITAL_COMMUNITY)
Admission: RE | Admit: 2016-11-21 | Discharge: 2016-11-21 | Disposition: A | Discharge status: Home / Self Care | Payer: Medicare Other | Source: Ambulatory Visit | Attending: Ophthalmology | Admitting: Ophthalmology

## 2016-11-21 ENCOUNTER — Encounter (HOSPITAL_COMMUNITY): Payer: Self-pay | Admitting: *Deleted

## 2016-11-21 ENCOUNTER — Encounter (HOSPITAL_COMMUNITY)
Admission: RE | Disposition: A | Discharge status: Home / Self Care | Payer: Self-pay | Source: Ambulatory Visit | Attending: Ophthalmology

## 2016-11-21 DIAGNOSIS — D649 Anemia, unspecified: Secondary | ICD-10-CM | POA: Diagnosis not present

## 2016-11-21 DIAGNOSIS — I739 Peripheral vascular disease, unspecified: Secondary | ICD-10-CM | POA: Insufficient documentation

## 2016-11-21 DIAGNOSIS — I10 Essential (primary) hypertension: Secondary | ICD-10-CM | POA: Diagnosis not present

## 2016-11-21 DIAGNOSIS — Z7984 Long term (current) use of oral hypoglycemic drugs: Secondary | ICD-10-CM | POA: Insufficient documentation

## 2016-11-21 DIAGNOSIS — E1136 Type 2 diabetes mellitus with diabetic cataract: Secondary | ICD-10-CM | POA: Insufficient documentation

## 2016-11-21 DIAGNOSIS — Z87891 Personal history of nicotine dependence: Secondary | ICD-10-CM | POA: Diagnosis not present

## 2016-11-21 DIAGNOSIS — I252 Old myocardial infarction: Secondary | ICD-10-CM | POA: Insufficient documentation

## 2016-11-21 DIAGNOSIS — I2510 Atherosclerotic heart disease of native coronary artery without angina pectoris: Secondary | ICD-10-CM | POA: Insufficient documentation

## 2016-11-21 DIAGNOSIS — Z79899 Other long term (current) drug therapy: Secondary | ICD-10-CM | POA: Diagnosis not present

## 2016-11-21 DIAGNOSIS — H25812 Combined forms of age-related cataract, left eye: Secondary | ICD-10-CM | POA: Diagnosis not present

## 2016-11-21 DIAGNOSIS — H2512 Age-related nuclear cataract, left eye: Secondary | ICD-10-CM | POA: Diagnosis not present

## 2016-11-21 HISTORY — PX: CATARACT EXTRACTION W/PHACO: SHX586

## 2016-11-21 LAB — GLUCOSE, CAPILLARY: GLUCOSE-CAPILLARY: 102 mg/dL — AB (ref 65–99)

## 2016-11-21 SURGERY — PHACOEMULSIFICATION, CATARACT, WITH IOL INSERTION
Anesthesia: Monitor Anesthesia Care | Site: Eye | Laterality: Left

## 2016-11-21 MED ORDER — PHENYLEPHRINE HCL 2.5 % OP SOLN
1.0000 [drp] | OPHTHALMIC | Status: AC
  Administered 2016-11-21 (×3): 1 [drp] via OPHTHALMIC

## 2016-11-21 MED ORDER — POVIDONE-IODINE 5 % OP SOLN
OPHTHALMIC | Status: DC | PRN
  Administered 2016-11-21: 1 via OPHTHALMIC

## 2016-11-21 MED ORDER — NEOMYCIN-POLYMYXIN-DEXAMETH 3.5-10000-0.1 OP SUSP
OPHTHALMIC | Status: DC | PRN
  Administered 2016-11-21: 2 [drp] via OPHTHALMIC

## 2016-11-21 MED ORDER — BSS IO SOLN
INTRAOCULAR | Status: DC | PRN
  Administered 2016-11-21: 15 mL via INTRAOCULAR

## 2016-11-21 MED ORDER — MIDAZOLAM HCL 2 MG/2ML IJ SOLN
1.0000 mg | INTRAMUSCULAR | Status: AC
Start: 2016-11-21 — End: 2016-11-21
  Administered 2016-11-21: 2 mg via INTRAVENOUS

## 2016-11-21 MED ORDER — FENTANYL CITRATE (PF) 100 MCG/2ML IJ SOLN
25.0000 ug | INTRAMUSCULAR | Status: AC
  Administered 2016-11-21: 25 ug via INTRAVENOUS

## 2016-11-21 MED ORDER — PROVISC 10 MG/ML IO SOLN
INTRAOCULAR | Status: DC | PRN
  Administered 2016-11-21: 0.85 mL via INTRAOCULAR

## 2016-11-21 MED ORDER — FENTANYL CITRATE (PF) 100 MCG/2ML IJ SOLN
INTRAMUSCULAR | Status: AC
  Filled 2016-11-21: qty 2

## 2016-11-21 MED ORDER — MIDAZOLAM HCL 2 MG/2ML IJ SOLN
INTRAMUSCULAR | Status: AC
  Filled 2016-11-21: qty 2

## 2016-11-21 MED ORDER — LIDOCAINE HCL 3.5 % OP GEL
1.0000 "application " | Freq: Once | OPHTHALMIC | Status: AC
  Administered 2016-11-21: 1 via OPHTHALMIC

## 2016-11-21 MED ORDER — CYCLOPENTOLATE-PHENYLEPHRINE 0.2-1 % OP SOLN
1.0000 [drp] | OPHTHALMIC | Status: AC
  Administered 2016-11-21 (×3): 1 [drp] via OPHTHALMIC

## 2016-11-21 MED ORDER — EPINEPHRINE PF 1 MG/ML IJ SOLN
INTRAOCULAR | Status: DC | PRN
  Administered 2016-11-21: 13:00:00

## 2016-11-21 MED ORDER — LIDOCAINE HCL (PF) 1 % IJ SOLN
INTRAOCULAR | Status: DC | PRN
  Administered 2016-11-21: 1 mL via OPHTHALMIC

## 2016-11-21 MED ORDER — LACTATED RINGERS IV SOLN
INTRAVENOUS | Status: DC
  Administered 2016-11-21: 12:00:00 via INTRAVENOUS

## 2016-11-21 MED ORDER — TETRACAINE HCL 0.5 % OP SOLN
1.0000 [drp] | OPHTHALMIC | Status: AC
  Administered 2016-11-21 (×3): 1 [drp] via OPHTHALMIC

## 2016-11-21 SURGICAL SUPPLY — 11 items
CLOTH BEACON ORANGE TIMEOUT ST (SAFETY) ×1 IMPLANT
EYE SHIELD UNIVERSAL CLEAR (GAUZE/BANDAGES/DRESSINGS) ×1 IMPLANT
GLOVE BIOGEL PI IND STRL 6.5 (GLOVE) IMPLANT
GLOVE BIOGEL PI IND STRL 7.0 (GLOVE) IMPLANT
GLOVE BIOGEL PI IND STRL 8 (GLOVE) IMPLANT
GLOVE BIOGEL PI INDICATOR 6.5 (GLOVE) ×1
GLOVE BIOGEL PI INDICATOR 7.0 (GLOVE) ×1
GLOVE BIOGEL PI INDICATOR 8 (GLOVE) ×1
PAD ARMBOARD 7.5X6 YLW CONV (MISCELLANEOUS) ×1 IMPLANT
SIGHTPATH CAT PROC W REG LENS (Ophthalmic Related) ×2 IMPLANT
WATER STERILE IRR 250ML POUR (IV SOLUTION) ×1 IMPLANT

## 2016-11-21 NOTE — Discharge Instructions (Signed)

## 2016-11-21 NOTE — Op Note (Signed)
Date of Admission: 11/21/2016  Date of Surgery: 11/21/2016  Pre-Op Dx: Cataract Left  Eye  Post-Op Dx: Senile Combined Cataract  Left  Eye,  Dx Code A21.308  Surgeon: Tonny Branch, M.D.  Assistants: None  Anesthesia: Topical with MAC  Indications: Painless, progressive loss of vision with compromise of daily activities.  Surgery: Cataract Extraction with Intraocular lens Implant Left Eye  Discription: The patient had dilating drops and viscous lidocaine placed into the Left eye in the pre-op holding area. After transfer to the operating room, a time out was performed. The patient was then prepped and draped. Beginning with a 19 degree blade a paracentesis port was made at the surgeon's 2 o'clock position. The anterior chamber was then filled with 1% non-preserved lidocaine. This was followed by filling the anterior chamber with Provisc.  A 2.61m keratome blade was used to make a clear corneal incision at the temporal limbus.  A bent cystatome needle was used to create a continuous tear capsulotomy. Hydrodissection was performed with balanced salt solution on a Fine canula. The lens nucleus was then removed using the phacoemulsification handpiece. Residual cortex was removed with the I&A handpiece. The anterior chamber and capsular bag were refilled with Provisc. A posterior chamber intraocular lens was placed into the capsular bag with it's injector. The implant was positioned with the Kuglan hook. The Provisc was then removed from the anterior chamber and capsular bag with the I&A handpiece. Stromal hydration of the main incision and paracentesis port was performed with BSS on a Fine canula. The wounds were tested for leak which was negative. The patient tolerated the procedure well. There were no operative complications. The patient was then transferred to the recovery room in stable condition.  Complications: None  Specimen: None  EBL: None  Prosthetic device: Abbott Technis, PCB00, power  24.0, SN [id number]

## 2016-11-21 NOTE — Anesthesia Preprocedure Evaluation (Signed)
Anesthesia Evaluation  Patient identified by MRN, date of birth, ID band Patient awake    Reviewed: Allergy & Precautions, H&P , NPO status , Patient's Chart, lab work & pertinent test results  Airway Mallampati: II  TM Distance: >3 FB Neck ROM: Full    Dental no notable dental hx.    Pulmonary shortness of breath, former smoker,    Pulmonary exam normal breath sounds clear to auscultation       Cardiovascular hypertension, Pt. on medications and Pt. on home beta blockers + angina + CAD, + Past MI, + Peripheral Vascular Disease and + DOE  Normal cardiovascular exam+ Valvular Problems/Murmurs  Rhythm:Regular Rate:Normal     Neuro/Psych CVA negative psych ROS   GI/Hepatic negative GI ROS, Neg liver ROS,   Endo/Other  diabetes, Type 2, Oral Hypoglycemic Agents  Renal/GU negative Renal ROS  negative genitourinary   Musculoskeletal negative musculoskeletal ROS (+)   Abdominal   Peds negative pediatric ROS (+)  Hematology negative hematology ROS (+) anemia ,   Anesthesia Other Findings   Reproductive/Obstetrics negative OB ROS                             Anesthesia Physical Anesthesia Plan  ASA: III  Anesthesia Plan: MAC   Post-op Pain Management:    Induction: Intravenous  Airway Management Planned: Nasal Cannula  Additional Equipment:   Intra-op Plan:   Post-operative Plan:   Informed Consent: I have reviewed the patients History and Physical, chart, labs and discussed the procedure including the risks, benefits and alternatives for the proposed anesthesia with the patient or authorized representative who has indicated his/her understanding and acceptance.     Plan Discussed with:   Anesthesia Plan Comments:         Anesthesia Quick Evaluation

## 2016-11-21 NOTE — Anesthesia Postprocedure Evaluation (Signed)
Anesthesia Post Note  Patient: Arthur Lutz  Procedure(s) Performed: Procedure(s) (LRB): CATARACT EXTRACTION PHACO AND INTRAOCULAR LENS PLACEMENT (IOC) CDE - 11.70  (Left)  Patient location during evaluation: Short Stay Anesthesia Type: MAC Level of consciousness: awake and alert, oriented and patient cooperative Pain management: pain level controlled Vital Signs Assessment: post-procedure vital signs reviewed and stable Respiratory status: spontaneous breathing, nonlabored ventilation and respiratory function stable Cardiovascular status: blood pressure returned to baseline Postop Assessment: no signs of nausea or vomiting Anesthetic complications: no     Last Vitals:  Vitals:   11/21/16 1145  BP: (!) 144/67  Pulse: (!) 48  Resp: 18  Temp: 36.6 C    Last Pain:  Vitals:   11/21/16 1145  TempSrc: Oral                 [name],[name]

## 2016-11-21 NOTE — Transfer of Care (Signed)
Immediate Anesthesia Transfer of Care Note  Patient: Arthur Lutz  Procedure(s) Performed: Procedure(s) with comments: CATARACT EXTRACTION PHACO AND INTRAOCULAR LENS PLACEMENT (IOC) CDE - 11.70  (Left) - left  Patient Location: Short Stay  Anesthesia Type:MAC  Level of Consciousness: awake  Airway & Oxygen Therapy: Patient Spontanous Breathing  Post-op Assessment: Report given to RN  Post vital signs: Reviewed and stable  Last Vitals:  Vitals:   11/21/16 1145  BP: (!) 144/67  Pulse: (!) 48  Resp: 18  Temp: 36.6 C    Last Pain:  Vitals:   11/21/16 1145  TempSrc: Oral      Patients Stated Pain Goal: 5 (56/38/75 [id number])  Complications: No apparent anesthesia complications

## 2016-11-21 NOTE — H&P (Signed)
I have reviewed the H&P, the patient was re-examined, and I have identified no interval changes in medical condition and plan of care since the history and physical of record  

## 2016-11-25 ENCOUNTER — Encounter (HOSPITAL_COMMUNITY): Payer: Self-pay | Admitting: Ophthalmology

## 2016-12-03 DIAGNOSIS — H353213 Exudative age-related macular degeneration, right eye, with inactive scar: Secondary | ICD-10-CM | POA: Diagnosis not present

## 2016-12-03 DIAGNOSIS — H35422 Microcystoid degeneration of retina, left eye: Secondary | ICD-10-CM | POA: Diagnosis not present

## 2016-12-03 DIAGNOSIS — H353221 Exudative age-related macular degeneration, left eye, with active choroidal neovascularization: Secondary | ICD-10-CM | POA: Diagnosis not present

## 2016-12-03 DIAGNOSIS — H43813 Vitreous degeneration, bilateral: Secondary | ICD-10-CM | POA: Diagnosis not present

## 2016-12-10 DIAGNOSIS — E1151 Type 2 diabetes mellitus with diabetic peripheral angiopathy without gangrene: Secondary | ICD-10-CM | POA: Diagnosis not present

## 2016-12-10 DIAGNOSIS — E1140 Type 2 diabetes mellitus with diabetic neuropathy, unspecified: Secondary | ICD-10-CM | POA: Diagnosis not present

## 2016-12-30 DIAGNOSIS — H3122 Choroidal dystrophy (central areolar) (generalized) (peripapillary): Secondary | ICD-10-CM | POA: Diagnosis not present

## 2017-01-14 ENCOUNTER — Other Ambulatory Visit: Payer: Self-pay | Admitting: Cardiology

## 2017-01-14 DIAGNOSIS — H353221 Exudative age-related macular degeneration, left eye, with active choroidal neovascularization: Secondary | ICD-10-CM | POA: Diagnosis not present

## 2017-01-14 DIAGNOSIS — H353213 Exudative age-related macular degeneration, right eye, with inactive scar: Secondary | ICD-10-CM | POA: Diagnosis not present

## 2017-01-14 DIAGNOSIS — H35422 Microcystoid degeneration of retina, left eye: Secondary | ICD-10-CM | POA: Diagnosis not present

## 2017-01-14 DIAGNOSIS — H43813 Vitreous degeneration, bilateral: Secondary | ICD-10-CM | POA: Diagnosis not present

## 2017-02-01 ENCOUNTER — Encounter (HOSPITAL_COMMUNITY): Payer: Self-pay

## 2017-02-01 ENCOUNTER — Emergency Department (HOSPITAL_COMMUNITY)
Admission: EM | Admit: 2017-02-01 | Discharge: 2017-02-01 | Disposition: A | Discharge status: Home / Self Care | Payer: Self-pay | Attending: Emergency Medicine | Admitting: Emergency Medicine

## 2017-02-01 ENCOUNTER — Emergency Department (HOSPITAL_COMMUNITY): Payer: Self-pay

## 2017-02-01 DIAGNOSIS — Y999 Unspecified external cause status: Secondary | ICD-10-CM | POA: Insufficient documentation

## 2017-02-01 DIAGNOSIS — E119 Type 2 diabetes mellitus without complications: Secondary | ICD-10-CM | POA: Insufficient documentation

## 2017-02-01 DIAGNOSIS — Z791 Long term (current) use of non-steroidal anti-inflammatories (NSAID): Secondary | ICD-10-CM | POA: Insufficient documentation

## 2017-02-01 DIAGNOSIS — Y929 Unspecified place or not applicable: Secondary | ICD-10-CM | POA: Insufficient documentation

## 2017-02-01 DIAGNOSIS — S3993XA Unspecified injury of pelvis, initial encounter: Secondary | ICD-10-CM | POA: Diagnosis not present

## 2017-02-01 DIAGNOSIS — R102 Pelvic and perineal pain: Secondary | ICD-10-CM | POA: Diagnosis not present

## 2017-02-01 DIAGNOSIS — S81012A Laceration without foreign body, left knee, initial encounter: Secondary | ICD-10-CM | POA: Insufficient documentation

## 2017-02-01 DIAGNOSIS — S8992XA Unspecified injury of left lower leg, initial encounter: Secondary | ICD-10-CM | POA: Diagnosis not present

## 2017-02-01 DIAGNOSIS — Z7984 Long term (current) use of oral hypoglycemic drugs: Secondary | ICD-10-CM | POA: Insufficient documentation

## 2017-02-01 DIAGNOSIS — I10 Essential (primary) hypertension: Secondary | ICD-10-CM | POA: Insufficient documentation

## 2017-02-01 DIAGNOSIS — Z79899 Other long term (current) drug therapy: Secondary | ICD-10-CM | POA: Insufficient documentation

## 2017-02-01 DIAGNOSIS — T148XXA Other injury of unspecified body region, initial encounter: Secondary | ICD-10-CM | POA: Diagnosis not present

## 2017-02-01 DIAGNOSIS — Z87891 Personal history of nicotine dependence: Secondary | ICD-10-CM | POA: Insufficient documentation

## 2017-02-01 DIAGNOSIS — W01198A Fall on same level from slipping, tripping and stumbling with subsequent striking against other object, initial encounter: Secondary | ICD-10-CM | POA: Insufficient documentation

## 2017-02-01 DIAGNOSIS — Y9389 Activity, other specified: Secondary | ICD-10-CM | POA: Insufficient documentation

## 2017-02-01 DIAGNOSIS — I2510 Atherosclerotic heart disease of native coronary artery without angina pectoris: Secondary | ICD-10-CM | POA: Insufficient documentation

## 2017-02-01 MED ORDER — HYDROCODONE-ACETAMINOPHEN 5-325 MG PO TABS: 1.0000 | ORAL_TABLET | Freq: Four times a day (QID) | ORAL | 0 refills | Status: DC | PRN

## 2017-02-01 MED ORDER — TETANUS-DIPHTH-ACELL PERTUSSIS 5-2.5-18.5 LF-MCG/0.5 IM SUSP
0.5000 mL | Freq: Once | INTRAMUSCULAR | Status: AC
  Administered 2017-02-01: 0.5 mL via INTRAMUSCULAR
  Filled 2017-02-01: qty 0.5

## 2017-02-01 MED ORDER — HYDROCODONE-ACETAMINOPHEN 5-325 MG PO TABS
1.0000 | ORAL_TABLET | Freq: Once | ORAL | Status: AC
  Administered 2017-02-01: 1 via ORAL
  Filled 2017-02-01: qty 1

## 2017-02-01 MED ORDER — LIDOCAINE HCL (PF) 1 % IJ SOLN
30.0000 mL | Freq: Once | INTRAMUSCULAR | Status: AC
  Administered 2017-02-01: 30 mL
  Filled 2017-02-01: qty 30

## 2017-02-01 MED ORDER — IBUPROFEN 600 MG PO TABS: 600.0000 mg | ORAL_TABLET | Freq: Four times a day (QID) | ORAL | 0 refills | Status: DC | PRN

## 2017-02-01 NOTE — ED Triage Notes (Signed)
Pt coming in with ems c.o mechanical fall. [place]. Pt denies LOC or hitting his head. Pt not taking any blood thinners. Pt states "there is about a 6 inch laceration horizontally across my knee"Bleeding controlled with gauze. Pt c.o pain in lower back and left knee 5/10. VSS Pt alert and orientedx4, nad

## 2017-02-01 NOTE — Discharge Instructions (Signed)
As discussed, keep area clean and dry and monitor for any signs of infection. Start changing the dressing daily after the first 48 hours. schedule a follow-up appointment with your primary care provider in a few days for wound recheck and for suture removal in 10-14 days. Returns sooner if you experience redness, swelling, increased pain, purulent discharge, warmth to the area, fever or other concerning symptoms. Use ibuprofen for pain and Vicodin only for breakthrough pain in between.

## 2017-02-01 NOTE — Progress Notes (Signed)
Orthopedic Tech Progress Note Patient Details:  Arthur Lutz 11-14-1940 [id number]  Ortho Devices Type of Ortho Device: Knee Immobilizer Ortho Device/Splint Location: lle Ortho Device/Splint Interventions: Application   [name] 02/01/2017, 4:46 PM

## 2017-02-01 NOTE — ED Notes (Signed)
ED Provider at bedside. 

## 2017-02-01 NOTE — ED Provider Notes (Signed)
[facility] [facility] DEPT Provider Note   CSN: [id number] Arrival date & time: 02/01/17  1131     History   Chief Complaint Chief Complaint  Patient presents with  . Knee Pain  . Laceration    HPI STRATTON VILLWOCK is a 76 y.o. male presenting via EMS after a left knee injury sustained [place]. He sustained a significant laceration across his left knee bleeding is controlled. He denies hitting his head or loss of consciousness. Patient does not take any anticoagulants. He also reports lower back pain. Denies numbness or difficulty walking. Patient states he has been ambulatory on the emergency department. Patient's family report that he was a little nauseated and lightheaded after the injury but this has resolved. Patient denies any symptoms prior to falling.  HPI  Past Medical History:  Diagnosis Date  . Allergic rhinitis   . Aortic valve sclerosis    Cause of murmur  . Arthritis of knee, left, needs total Knee in future with Dr. Wynelle Link  08/07/2011  . Bladder cancer ([place]) 11/2005  . CAD S/P percutaneous coronary angioplasty 08/07/2011   NEW 08/07/11 cutting balloon PTCA to 95% ostial diag. and 90%  proximal LCX.  prior cutting balloon and to RCA  & ostium of ostial diag; Last Cath 02/2012 - Patent Diag PTCA, RCA & Circ BMS stents. Normal EF & EDP; Myoview 2/14 no evidence of ischemia or infarction  . Diabetes mellitus type 2, controlled, with complications ([place])   . Diabetes mellitus without complication ([place])   . Dyslipidemia   . History of heart attack 11/2007   "mild; did not affect the heart muscle"; PCI to RCA, Cutting PTCA Diag ostium  . Hypertension   . Macular degeneration   . Macular degeneration of right eye    Now getting shots.   . Melanoma of skin ([place]) ~ 2005   "level 2; on my back"  . Myocardial infarction ([place]) 2012  . Osteoarthritis    Status post right knee arthroplasty, positive for staph infection. Pending right  arthroplasty  . Portacath in place 08/11/2012  . Statin intolerance 08/07/2011  . Stroke [facility]) 1980's   "had facial strokes; 2; about a year apart; never did find what caused them"    Patient Active Problem List   Diagnosis Date Noted  . Chronic stable angina ([place]) - negative Myoview & no new Dz on Cath 05/23/2015  . Insomnia due to stress 07/04/2014    Class: Chronic  . Venous stasis of both lower extremities 11/24/2013  . Abnormal biliary HIDA scan 09/15/2013  . Intestinal bacterial overgrowth 08/16/2013  . Bloating 08/16/2013  . Obesity (BMI 30.0-34.9) 01/30/2013  . Stiffness of joint, not elsewhere classified, lower leg 10/26/2012  . Weakness of left leg 10/26/2012  . Difficulty in walking 10/26/2012  . Postoperative anemia due to acute blood loss 09/29/2012  . OA (osteoarthritis) of knee 09/28/2012  . Portacath in place 08/11/2012  . CAD S/P percutaneous coronary angioplasty: 08/07/11 cutting balloon PTCA to 95% ostial diag. and 90%  proximal LCX.  prior cutting balloon to RCA  & ostium of ostial diag., 2009  08/07/2011  . Essential hypertension 08/07/2011  . DOE (dyspnea on exertion) 08/07/2011  . Diabetes mellitus type 2, controlled, with complications ([place]) 03/50/0938  . Hyperlipemia, intolerant to statins. 08/07/2011  . Arthritis of knee, left, needs total Knee in future with Dr. Wynelle Link  08/07/2011  . Statin intolerance 08/07/2011  .  Dyspnea 05/02/2011    Past Surgical History:  Procedure Laterality Date  . CARDIAC CATHETERIZATION  August '13   Widely patent RCA and LCx stents. Also patent PTCA site to D1, ~40-50% D2. Normal EF, normal EDP  . CATARACT EXTRACTION W/ INTRAOCULAR LENS IMPLANT  ~ 2010   right  . CATARACT EXTRACTION W/PHACO Left 11/21/2016   Procedure: CATARACT EXTRACTION PHACO AND INTRAOCULAR LENS PLACEMENT (IOC) CDE - 11.70 ;  Surgeon: Tonny Branch, MD;  Location: [facility] ORS;  Service: Ophthalmology;  Laterality: Left;  left  . cold cup removal bladder lesion   11/2005   "malignant"  . COLONOSCOPY N/A 12/09/2014   Procedure: COLONOSCOPY;  Surgeon: Rogene Houston, MD;  Location: [facility] ENDO SUITE;  Service: Endoscopy;  Laterality: N/A;  955 - moved to 8:30 - Ann to notify pt  . CORONARY ANGIOPLASTY WITH STENT PLACEMENT  11/2007   Mid RCA - Driver BMS 3.5 mm x 15 mm; 2.0 mm Cutting Balloon PTCA - ostial D1  . CORONARY ANGIOPLASTY WITH STENT PLACEMENT  08/07/11   Abnormal TM Myoview - for exertional throat discomfort: Patent RCA stent, 90% circumflex -- Vision BMS 3.5 mm x 12 mm --> 4.2 mm. Ostial D2 95% -- 2.25 mm Cutting Balloon PTCA  . DOPPLER ECHOCARDIOGRAPHY  May 2009   Normal EF, impaired relaxation, with sclerotic aortic valve. No stenosis.  . INGUINAL HERNIA REPAIR  ~ 1970   left  . KNEE ARTHROSCOPY  02/20/11   left  . LEFT HEART CATHETERIZATION WITH CORONARY ANGIOGRAM N/A 08/07/2011   Procedure: LEFT HEART CATHETERIZATION WITH CORONARY ANGIOGRAM;  Surgeon: Leonie Man, MD;  Location: [facility] CATH LAB;  Service: Cardiovascular;  Laterality: N/A;  . LEFT HEART CATHETERIZATION WITH CORONARY ANGIOGRAM N/A 03/06/2012   Procedure: LEFT HEART CATHETERIZATION WITH CORONARY ANGIOGRAM;  Surgeon: Leonie Man, MD;  Location: [facility] CATH LAB;  Service: Cardiovascular;  Laterality: N/A;  . NM MYOVIEW LTD  Jan. 31, 2014   Low risk, EF 49%, improved from prior. Small apical and apical septal defect, fixed likely artifact no skin or infarction.  Marland Kitchen PARTIAL KNEE ARTHROPLASTY  06/2003   right  . PERCUTANEOUS CORONARY STENT INTERVENTION (PCI-S)  08/07/2011   Procedure: PERCUTANEOUS CORONARY STENT INTERVENTION (PCI-S);  Surgeon: Leonie Man, MD;  Location: [facility] CATH LAB;  Service: Cardiovascular;;  . TONSILLECTOMY     "as a child"  . TOTAL KNEE ARTHROPLASTY  11/2003   right x2 and one on left- Total of 3.  . TOTAL KNEE ARTHROPLASTY Left 09/28/2012   Procedure: TOTAL KNEE ARTHROPLASTY;  Surgeon: Gearlean Alf, MD;  Location: [facility] ORS;  Service: Orthopedics;  Laterality: Left;         Home Medications    Prior to Admission medications   Medication Sig Start Date End Date Taking? Authorizing Provider  amLODipine (NORVASC) 5 MG tablet TAKE 1 BY MOUTH DAILY AFTER BREAKFAST 07/25/15   Leonie Man, MD  Ascorbic Acid (VITAMIN C) 1000 MG tablet Take 1,000 mg by mouth daily.    [provider]  clobetasol cream (TEMOVATE) 3.57 % Apply 1 application topically daily as needed.  03/02/15   [provider]  HYDROcodone-acetaminophen (NORCO/VICODIN) 5-325 MG tablet Take 1 tablet by mouth every 6 (six) hours as needed for severe pain. 02/01/17   Emeline General, PA-C  ibuprofen (ADVIL,MOTRIN) 600 MG tablet Take 1 tablet (600 mg total) by mouth every 6 (six) hours as needed. 02/01/17   Avie Echevaria B, PA-C  isosorbide mononitrate (  IMDUR) 60 MG 24 hr tablet Take 1 tablet (60 mg total) by mouth daily. 01/12/16   Leonie Man, MD  losartan-hydrochlorothiazide (HYZAAR) 100-25 MG tablet Take 0.5 tablets by mouth daily. 09/30/16   Leonie Man, MD  Melatonin 10 MG CAPS Take 10 tablets by mouth at bedtime. For sleep     [provider]  metFORMIN (GLUCOPHAGE) 500 MG tablet Take 500 mg by mouth daily with breakfast.     [provider]  metoprolol succinate (TOPROL-XL) 25 MG 24 hr tablet TAKE 1/2 TABLET BY MOUTH EVERY DAY 01/14/17   Leonie Man, MD  Multiple Vitamins-Minerals (EYE-VITES PO) Take by mouth. Tozol    [provider]  Multiple Vitamins-Minerals (PRESERVISION AREDS PO) Take 1 capsule by mouth daily.     [provider]  Omega-3 Fatty Acids (FISH OIL) 1000 MG CAPS Take 1 capsule by mouth daily.    [provider]  simvastatin (ZOCOR) 10 MG tablet Take 0.5 tablets (5 mg total) by mouth daily at 6 PM. Take 1/2 tablet a day by mouth Patient taking differently: Take 10 mg by mouth daily. Take 1/2 tablet a day by mouth 04/04/15   Leonie Man, MD  tamsulosin (FLOMAX) 0.4 MG CAPS capsule Take 0.4 mg by  mouth every morning.    [provider]  triazolam (HALCION) 0.125 MG tablet Take 0.1875 mg by mouth at bedtime. Pt takes 1.5 tabs at bedtime    [provider]    Family History Family History  Problem Relation Age of Onset  . Emphysema Father   . Lung cancer Paternal Uncle        x 2 smokers  . Colon cancer Neg Hx     Social History Social History  Substance Use Topics  . Smoking status: Former Smoker    Packs/day: 2.00    Years: 25.00    Types: Cigarettes    Quit date: 07/29/1980  . Smokeless tobacco: Never Used  . Alcohol use 1.1 oz/week    1 Glasses of wine, 1 Standard drinks or equivalent per week     Comment:  "wine or whiskey" occasionally     Allergies   Statins   Review of Systems Review of Systems  Constitutional: Negative for chills and fever.  HENT: Negative for ear pain and sore throat.   Eyes: Negative for pain and visual disturbance.  Respiratory: Negative for cough, chest tightness, shortness of breath, wheezing and stridor.   Cardiovascular: Negative for chest pain, palpitations and leg swelling.  Gastrointestinal: Negative for abdominal distention, abdominal pain, nausea and vomiting.  Genitourinary: Negative for dysuria and hematuria.  Musculoskeletal: Positive for arthralgias and myalgias. Negative for back pain, joint swelling, neck pain and neck stiffness.  Skin: Positive for wound. Negative for color change, pallor and rash.  Neurological: Negative for dizziness, seizures, syncope, weakness, light-headedness and numbness.     Physical Exam Updated Vital Signs BP (!) 145/79   Pulse 76   Temp 97.7 F (36.5 C) (Oral)   Resp 18   Ht 6' (1.829 m)   Wt 112 kg (247 lb)   SpO2 95%   BMI 33.50 kg/m   Physical Exam  Constitutional: He appears well-developed and well-nourished. No distress.  Afebrile, nontoxic-appearing, lying comfortably in bed in no acute distress.  HENT:  Head: Normocephalic and atraumatic.  Eyes:  Conjunctivae and EOM are normal.  Neck: Normal range of motion. Neck supple.  Cardiovascular: Normal rate, regular rhythm, normal heart sounds  and intact distal pulses.   No murmur heard. Pulmonary/Chest: Effort normal and breath sounds normal. No respiratory distress. He has no wheezes. He has no rales.  Abdominal: He exhibits no distension.  Musculoskeletal: Normal range of motion. He exhibits tenderness. He exhibits no edema.  Neurological: He is alert. No sensory deficit.  5 out of 5 strength to resisted flexion and extension of the knee, plantar flexion dorsiflexion. Neurovascularly intact distally. Normal stance and gait, patient is ambulating without difficulty to the restroom on the emergency department.  Skin: Skin is warm and dry. Capillary refill takes less than 2 seconds. He is not diaphoretic. No pallor.  Psychiatric: He has a normal mood and affect.  Nursing note and vitals reviewed.          ED Treatments / Results  Labs (all labs ordered are listed, but only abnormal results are displayed) Labs Reviewed - No data to display  EKG  EKG Interpretation None       Radiology Dg Pelvis 1-2 Views  Result Date: 02/01/2017 CLINICAL DATA:  Initial encounter for Patient reports falling directly onto left knee, reports he initially felt nauseas, reports generalized middle low back pain. EXAM: PELVIS - 1-2 VIEW COMPARISON:  06/25/2013 lumbar spine radiographs. FINDINGS: Single AP view of the pelvis. Femoral heads are located. No acute fracture. Sacroiliac joints are symmetric. IMPRESSION: No acute osseous abnormality. Electronically Signed   By: Abigail Miyamoto M.D.   On: 02/01/2017 13:33   Dg Knee Complete 4 Views Left  Result Date: 02/01/2017 CLINICAL DATA:  76 year old male with a history of fall onto the knee EXAM: LEFT KNEE - COMPLETE 4+ VIEW COMPARISON:  None. FINDINGS: Surgical changes of prior arthroplasty. No acute fracture line identified. Joint appears congruent. No  evidence of joint effusion. Dressing overlies the soft tissues on the lateral view. IMPRESSION: Negative for acute bony abnormality. Surgical changes of prior arthroplasty Electronically Signed   By: Corrie Mckusick D.O.   On: 02/01/2017 13:32    Procedures Procedures (including critical care time) LACERATION REPAIR Performed by: Emeline General Authorized by: Emeline General Consent: Verbal consent obtained. Risks and benefits: risks, benefits and alternatives were discussed Consent given by: patient Patient identity confirmed: provided demographic data Prepped and Draped in normal sterile fashion Wound explored   Laceration Location: Left anterior knee  Laceration Length: 10 cm  No Foreign Bodies seen or palpated  Anesthesia: local infiltration  Local anesthetic: lidocaine 1 % without epinephrine  Anesthetic total: 12 ml  Irrigation method: syringe Amount of cleaning: standard  Skin closure: 4. 0 Prolene nonabsorbable   Number of sutures: 12   Technique: Horizontal mattress (10), simple interrupted (2)   Patient tolerance: Patient tolerated the procedure well with no immediate complications.  SPLINT APPLICATION Date/Time: 3:71 PM Authorized by: Emeline General Consent: Verbal consent obtained. Risks and benefits: risks, benefits and alternatives were discussed Consent given by: patient Splint applied by: orthopedic technician Location details: left knee Splint type: soft knee immobilizer Post-procedure: The splinted body part was neurovascularly unchanged following the procedure. Patient tolerance: Patient tolerated the procedure well with no immediate complications.   Medications Ordered in ED Medications  Tdap (BOOSTRIX) injection 0.5 mL (0.5 mLs Intramuscular Given 02/01/17 1323)  lidocaine (PF) (XYLOCAINE) 1 % injection 30 mL (30 mLs Infiltration Given 02/01/17 1325)  HYDROcodone-acetaminophen (NORCO/VICODIN) 5-325 MG per tablet 1 tablet (1 tablet Oral  Given 02/01/17 1456)     Initial Impression / Assessment and Plan / ED Course  I have reviewed the triage vital signs and the nursing notes.  Pertinent labs & imaging results that were available during my care of the patient were reviewed by me and considered in my medical decision making (see chart for details).    Patient presenting after slip and fall and sustaining left knee injury. Approximately 10 cm laceration above the anterior knee joint.. Patient has a full left knee replacement. Plain films without acute fractures or dislocation. Hardware appears intact.  He is unsure of tetanus status. Tdap Updated in ED  thorough pressure irrigation performed. Wound explored and base of wound visualized in a bloodless field without evidence of foreign body.  Laceration occurred < 8 hours prior to repair which was well tolerated. Tdap updated.    Discussed suture home care with patient and answered questions. Pt to follow-up for wound check and suture removal in 10-14 days;  they are to return to the ED sooner for signs of infection. Pt is hemodynamically stable with no complaints prior to dc.   Discharge home with close PCP follow-up for a wound recheck, patient was advised to keep the area clean and dry and monitor for any signs of infection.  Discussed strict return precautions and advised to return to the emergency department if experiencing any new or worsening symptoms. Instructions were understood and patient agreed with discharge plan.  Final Clinical Impressions(s) / ED Diagnoses   Final diagnoses:  Injury of left knee, initial encounter  Laceration of skin of left knee, initial encounter    New Prescriptions New Prescriptions   HYDROCODONE-ACETAMINOPHEN (NORCO/VICODIN) 5-325 MG TABLET    Take 1 tablet by mouth every 6 (six) hours as needed for severe pain.   IBUPROFEN (ADVIL,MOTRIN) 600 MG TABLET    Take 1 tablet (600 mg total) by mouth every 6 (six) hours as needed.       Emeline General, PA-C 02/01/17 1650    Davonna Belling, MD 02/02/17 1254

## 2017-02-01 NOTE — ED Notes (Signed)
Ortho at bedside.

## 2017-02-01 NOTE — ED Notes (Signed)
Patient transported to X-ray 

## 2017-02-05 ENCOUNTER — Other Ambulatory Visit: Payer: Self-pay | Admitting: Cardiology

## 2017-02-05 ENCOUNTER — Other Ambulatory Visit: Payer: Self-pay | Admitting: Cardiovascular Disease

## 2017-02-05 DIAGNOSIS — Z6834 Body mass index (BMI) 34.0-34.9, adult: Secondary | ICD-10-CM | POA: Diagnosis not present

## 2017-02-05 DIAGNOSIS — S81012D Laceration without foreign body, left knee, subsequent encounter: Secondary | ICD-10-CM | POA: Diagnosis not present

## 2017-02-05 DIAGNOSIS — G4701 Insomnia due to medical condition: Secondary | ICD-10-CM | POA: Diagnosis not present

## 2017-02-05 DIAGNOSIS — L309 Dermatitis, unspecified: Secondary | ICD-10-CM | POA: Diagnosis not present

## 2017-02-05 NOTE — Telephone Encounter (Signed)
REFILL 

## 2017-02-06 DIAGNOSIS — S81012A Laceration without foreign body, left knee, initial encounter: Secondary | ICD-10-CM | POA: Diagnosis not present

## 2017-02-14 DIAGNOSIS — S81012D Laceration without foreign body, left knee, subsequent encounter: Secondary | ICD-10-CM | POA: Diagnosis not present

## 2017-02-18 ENCOUNTER — Other Ambulatory Visit: Payer: Self-pay

## 2017-02-18 DIAGNOSIS — E1151 Type 2 diabetes mellitus with diabetic peripheral angiopathy without gangrene: Secondary | ICD-10-CM | POA: Diagnosis not present

## 2017-02-18 DIAGNOSIS — E1140 Type 2 diabetes mellitus with diabetic neuropathy, unspecified: Secondary | ICD-10-CM | POA: Diagnosis not present

## 2017-02-18 MED ORDER — METOPROLOL SUCCINATE ER 25 MG PO TB24: 12.5000 mg | ORAL_TABLET | Freq: Every day | ORAL | 0 refills | Status: DC

## 2017-02-25 DIAGNOSIS — H353221 Exudative age-related macular degeneration, left eye, with active choroidal neovascularization: Secondary | ICD-10-CM | POA: Diagnosis not present

## 2017-02-25 DIAGNOSIS — H35422 Microcystoid degeneration of retina, left eye: Secondary | ICD-10-CM | POA: Diagnosis not present

## 2017-02-25 DIAGNOSIS — H43813 Vitreous degeneration, bilateral: Secondary | ICD-10-CM | POA: Diagnosis not present

## 2017-02-25 DIAGNOSIS — H353213 Exudative age-related macular degeneration, right eye, with inactive scar: Secondary | ICD-10-CM | POA: Diagnosis not present

## 2017-02-28 ENCOUNTER — Other Ambulatory Visit: Payer: Self-pay | Admitting: Cardiology

## 2017-03-01 DIAGNOSIS — H524 Presbyopia: Secondary | ICD-10-CM | POA: Diagnosis not present

## 2017-03-03 ENCOUNTER — Other Ambulatory Visit: Payer: Self-pay

## 2017-03-03 MED ORDER — ISOSORBIDE MONONITRATE ER 60 MG PO TB24: 60.0000 mg | ORAL_TABLET | Freq: Every day | ORAL | 0 refills | Status: DC

## 2017-03-11 DIAGNOSIS — Z85820 Personal history of malignant melanoma of skin: Secondary | ICD-10-CM | POA: Diagnosis not present

## 2017-03-11 DIAGNOSIS — D225 Melanocytic nevi of trunk: Secondary | ICD-10-CM | POA: Diagnosis not present

## 2017-03-11 DIAGNOSIS — L820 Inflamed seborrheic keratosis: Secondary | ICD-10-CM | POA: Diagnosis not present

## 2017-03-11 DIAGNOSIS — Z85828 Personal history of other malignant neoplasm of skin: Secondary | ICD-10-CM | POA: Diagnosis not present

## 2017-03-18 ENCOUNTER — Other Ambulatory Visit: Payer: Self-pay | Admitting: Cardiology

## 2017-04-01 DIAGNOSIS — H31091 Other chorioretinal scars, right eye: Secondary | ICD-10-CM | POA: Diagnosis not present

## 2017-04-01 DIAGNOSIS — H35422 Microcystoid degeneration of retina, left eye: Secondary | ICD-10-CM | POA: Diagnosis not present

## 2017-04-01 DIAGNOSIS — H353213 Exudative age-related macular degeneration, right eye, with inactive scar: Secondary | ICD-10-CM | POA: Diagnosis not present

## 2017-04-01 DIAGNOSIS — H353221 Exudative age-related macular degeneration, left eye, with active choroidal neovascularization: Secondary | ICD-10-CM | POA: Diagnosis not present

## 2017-04-11 DIAGNOSIS — R109 Unspecified abdominal pain: Secondary | ICD-10-CM | POA: Diagnosis not present

## 2017-04-11 DIAGNOSIS — N401 Enlarged prostate with lower urinary tract symptoms: Secondary | ICD-10-CM | POA: Diagnosis not present

## 2017-04-11 DIAGNOSIS — R972 Elevated prostate specific antigen [PSA]: Secondary | ICD-10-CM | POA: Diagnosis not present

## 2017-04-11 DIAGNOSIS — Z8551 Personal history of malignant neoplasm of bladder: Secondary | ICD-10-CM | POA: Diagnosis not present

## 2017-04-11 DIAGNOSIS — N138 Other obstructive and reflux uropathy: Secondary | ICD-10-CM | POA: Diagnosis not present

## 2017-04-11 DIAGNOSIS — R3911 Hesitancy of micturition: Secondary | ICD-10-CM | POA: Diagnosis not present

## 2017-04-14 ENCOUNTER — Ambulatory Visit (INDEPENDENT_AMBULATORY_CARE_PROVIDER_SITE_OTHER): Payer: Medicare Other | Admitting: Otolaryngology

## 2017-04-14 DIAGNOSIS — R51 Headache: Secondary | ICD-10-CM | POA: Diagnosis not present

## 2017-04-14 DIAGNOSIS — J342 Deviated nasal septum: Secondary | ICD-10-CM | POA: Diagnosis not present

## 2017-04-14 DIAGNOSIS — J343 Hypertrophy of nasal turbinates: Secondary | ICD-10-CM | POA: Diagnosis not present

## 2017-04-14 DIAGNOSIS — J330 Polyp of nasal cavity: Secondary | ICD-10-CM | POA: Diagnosis not present

## 2017-04-24 ENCOUNTER — Encounter (HOSPITAL_COMMUNITY)
Admission: RE | Admit: 2017-04-24 | Discharge: 2017-04-24 | Disposition: A | Discharge status: Home / Self Care | Payer: Medicare Other | Source: Ambulatory Visit | Attending: Family Medicine | Admitting: Family Medicine

## 2017-04-24 DIAGNOSIS — Z451 Encounter for adjustment and management of infusion pump: Secondary | ICD-10-CM | POA: Insufficient documentation

## 2017-04-24 DIAGNOSIS — Z95828 Presence of other vascular implants and grafts: Secondary | ICD-10-CM | POA: Insufficient documentation

## 2017-04-24 MED ORDER — HEPARIN SOD (PORK) LOCK FLUSH 100 UNIT/ML IV SOLN
INTRAVENOUS | Status: AC
  Filled 2017-04-24: qty 5

## 2017-04-24 MED ORDER — SODIUM CHLORIDE 0.9% FLUSH
10.0000 mL | INTRAVENOUS | Status: AC | PRN
  Administered 2017-04-24: 10 mL

## 2017-04-24 MED ORDER — SODIUM CHLORIDE 0.9% FLUSH
INTRAVENOUS | Status: AC
  Filled 2017-04-24: qty 10

## 2017-04-24 MED ORDER — HEPARIN SOD (PORK) LOCK FLUSH 100 UNIT/ML IV SOLN
500.0000 [IU] | INTRAVENOUS | Status: AC | PRN
  Administered 2017-04-24: 500 [IU]

## 2017-04-28 ENCOUNTER — Other Ambulatory Visit: Payer: Self-pay

## 2017-04-28 ENCOUNTER — Other Ambulatory Visit: Payer: Self-pay | Admitting: Cardiology

## 2017-04-28 MED ORDER — ISOSORBIDE MONONITRATE ER 60 MG PO TB24: 60.0000 mg | ORAL_TABLET | Freq: Every day | ORAL | 0 refills | Status: DC

## 2017-04-28 NOTE — Telephone Encounter (Signed)
Rx(s) sent to pharmacy electronically.  

## 2017-05-07 DIAGNOSIS — H353221 Exudative age-related macular degeneration, left eye, with active choroidal neovascularization: Secondary | ICD-10-CM | POA: Diagnosis not present

## 2017-05-07 DIAGNOSIS — H353213 Exudative age-related macular degeneration, right eye, with inactive scar: Secondary | ICD-10-CM | POA: Diagnosis not present

## 2017-05-07 DIAGNOSIS — H35422 Microcystoid degeneration of retina, left eye: Secondary | ICD-10-CM | POA: Diagnosis not present

## 2017-05-07 DIAGNOSIS — H43813 Vitreous degeneration, bilateral: Secondary | ICD-10-CM | POA: Diagnosis not present

## 2017-05-13 DIAGNOSIS — E1151 Type 2 diabetes mellitus with diabetic peripheral angiopathy without gangrene: Secondary | ICD-10-CM | POA: Diagnosis not present

## 2017-05-13 DIAGNOSIS — E1140 Type 2 diabetes mellitus with diabetic neuropathy, unspecified: Secondary | ICD-10-CM | POA: Diagnosis not present

## 2017-05-29 DIAGNOSIS — H02105 Unspecified ectropion of left lower eyelid: Secondary | ICD-10-CM | POA: Diagnosis not present

## 2017-06-03 ENCOUNTER — Other Ambulatory Visit: Payer: Self-pay | Admitting: Cardiovascular Disease

## 2017-06-04 ENCOUNTER — Other Ambulatory Visit: Payer: Self-pay | Admitting: Cardiology

## 2017-06-12 DIAGNOSIS — Z1389 Encounter for screening for other disorder: Secondary | ICD-10-CM | POA: Diagnosis not present

## 2017-06-12 DIAGNOSIS — E119 Type 2 diabetes mellitus without complications: Secondary | ICD-10-CM | POA: Diagnosis not present

## 2017-06-12 DIAGNOSIS — Z0000 Encounter for general adult medical examination without abnormal findings: Secondary | ICD-10-CM | POA: Diagnosis not present

## 2017-06-12 DIAGNOSIS — Z0001 Encounter for general adult medical examination with abnormal findings: Secondary | ICD-10-CM | POA: Diagnosis not present

## 2017-06-12 DIAGNOSIS — J3489 Other specified disorders of nose and nasal sinuses: Secondary | ICD-10-CM | POA: Diagnosis not present

## 2017-06-12 DIAGNOSIS — Z6835 Body mass index (BMI) 35.0-35.9, adult: Secondary | ICD-10-CM | POA: Diagnosis not present

## 2017-06-12 DIAGNOSIS — R972 Elevated prostate specific antigen [PSA]: Secondary | ICD-10-CM | POA: Diagnosis not present

## 2017-06-13 DIAGNOSIS — H35422 Microcystoid degeneration of retina, left eye: Secondary | ICD-10-CM | POA: Diagnosis not present

## 2017-06-13 DIAGNOSIS — H43813 Vitreous degeneration, bilateral: Secondary | ICD-10-CM | POA: Diagnosis not present

## 2017-06-13 DIAGNOSIS — H353213 Exudative age-related macular degeneration, right eye, with inactive scar: Secondary | ICD-10-CM | POA: Diagnosis not present

## 2017-06-13 DIAGNOSIS — H353221 Exudative age-related macular degeneration, left eye, with active choroidal neovascularization: Secondary | ICD-10-CM | POA: Diagnosis not present

## 2017-07-01 ENCOUNTER — Other Ambulatory Visit: Payer: Self-pay | Admitting: Cardiology

## 2017-07-10 MED ORDER — SODIUM CHLORIDE 0.9% FLUSH: 10.0000 mL | INTRAVENOUS | Status: DC | PRN

## 2017-07-10 MED ORDER — HEPARIN SOD (PORK) LOCK FLUSH 100 UNIT/ML IV SOLN
500.0000 [IU] | INTRAVENOUS | Status: AC | PRN
  Administered 2017-07-11: 500 [IU]

## 2017-07-11 ENCOUNTER — Encounter (HOSPITAL_COMMUNITY)
Admission: RE | Admit: 2017-07-11 | Discharge: 2017-07-11 | Disposition: A | Discharge status: Home / Self Care | Payer: Medicare Other | Source: Ambulatory Visit | Attending: Family Medicine | Admitting: Family Medicine

## 2017-07-11 ENCOUNTER — Other Ambulatory Visit: Payer: Self-pay | Admitting: Cardiology

## 2017-07-11 DIAGNOSIS — Z452 Encounter for adjustment and management of vascular access device: Secondary | ICD-10-CM | POA: Insufficient documentation

## 2017-07-11 DIAGNOSIS — Z95828 Presence of other vascular implants and grafts: Secondary | ICD-10-CM | POA: Insufficient documentation

## 2017-07-11 MED ORDER — MEPERIDINE HCL 50 MG/ML IJ SOLN
INTRAMUSCULAR | Status: AC
  Filled 2017-07-11: qty 1

## 2017-07-11 MED ORDER — LIDOCAINE VISCOUS 2 % MT SOLN
OROMUCOSAL | Status: AC
  Filled 2017-07-11: qty 15

## 2017-07-11 MED ORDER — HEPARIN SOD (PORK) LOCK FLUSH 100 UNIT/ML IV SOLN
INTRAVENOUS | Status: AC
  Filled 2017-07-11: qty 5

## 2017-07-11 MED ORDER — MIDAZOLAM HCL 5 MG/5ML IJ SOLN
INTRAMUSCULAR | Status: AC
  Filled 2017-07-11: qty 10

## 2017-07-11 NOTE — Progress Notes (Signed)
Port a cath accessed and blood return noted. Port flushed real well and was flushed with heparin.

## 2017-07-14 NOTE — Telephone Encounter (Signed)
REFILL 

## 2017-07-15 DIAGNOSIS — H353221 Exudative age-related macular degeneration, left eye, with active choroidal neovascularization: Secondary | ICD-10-CM | POA: Diagnosis not present

## 2017-07-15 DIAGNOSIS — H353213 Exudative age-related macular degeneration, right eye, with inactive scar: Secondary | ICD-10-CM | POA: Diagnosis not present

## 2017-07-15 DIAGNOSIS — H35422 Microcystoid degeneration of retina, left eye: Secondary | ICD-10-CM | POA: Diagnosis not present

## 2017-07-15 DIAGNOSIS — H43813 Vitreous degeneration, bilateral: Secondary | ICD-10-CM | POA: Diagnosis not present

## 2017-07-18 ENCOUNTER — Telehealth: Payer: Self-pay | Admitting: Cardiology

## 2017-07-18 MED ORDER — ISOSORBIDE MONONITRATE ER 60 MG PO TB24: ORAL_TABLET | ORAL | 2 refills | Status: DC

## 2017-07-18 NOTE — Telephone Encounter (Signed)
Pt called to have med refilled IMDUR 60mg      [facility] in [place]

## 2017-08-05 DIAGNOSIS — E1151 Type 2 diabetes mellitus with diabetic peripheral angiopathy without gangrene: Secondary | ICD-10-CM | POA: Diagnosis not present

## 2017-08-05 DIAGNOSIS — E1140 Type 2 diabetes mellitus with diabetic neuropathy, unspecified: Secondary | ICD-10-CM | POA: Diagnosis not present

## 2017-08-06 ENCOUNTER — Other Ambulatory Visit: Payer: Self-pay | Admitting: Cardiology

## 2017-08-08 DIAGNOSIS — N401 Enlarged prostate with lower urinary tract symptoms: Secondary | ICD-10-CM | POA: Diagnosis not present

## 2017-08-08 DIAGNOSIS — Z8551 Personal history of malignant neoplasm of bladder: Secondary | ICD-10-CM | POA: Diagnosis not present

## 2017-08-08 DIAGNOSIS — R972 Elevated prostate specific antigen [PSA]: Secondary | ICD-10-CM | POA: Diagnosis not present

## 2017-08-08 DIAGNOSIS — N138 Other obstructive and reflux uropathy: Secondary | ICD-10-CM | POA: Diagnosis not present

## 2017-08-19 DIAGNOSIS — H353213 Exudative age-related macular degeneration, right eye, with inactive scar: Secondary | ICD-10-CM | POA: Diagnosis not present

## 2017-08-19 DIAGNOSIS — H31091 Other chorioretinal scars, right eye: Secondary | ICD-10-CM | POA: Diagnosis not present

## 2017-08-19 DIAGNOSIS — H35422 Microcystoid degeneration of retina, left eye: Secondary | ICD-10-CM | POA: Diagnosis not present

## 2017-08-19 DIAGNOSIS — H353221 Exudative age-related macular degeneration, left eye, with active choroidal neovascularization: Secondary | ICD-10-CM | POA: Diagnosis not present

## 2017-08-29 ENCOUNTER — Ambulatory Visit: Payer: Medicare Other | Admitting: Cardiology

## 2017-08-29 ENCOUNTER — Encounter: Payer: Self-pay | Admitting: Cardiology

## 2017-08-29 VITALS — BP 138/72 | HR 42 | Ht 72.0 in | Wt 259.0 lb

## 2017-08-29 DIAGNOSIS — M170 Bilateral primary osteoarthritis of knee: Secondary | ICD-10-CM | POA: Diagnosis not present

## 2017-08-29 DIAGNOSIS — R001 Bradycardia, unspecified: Secondary | ICD-10-CM | POA: Insufficient documentation

## 2017-08-29 DIAGNOSIS — I10 Essential (primary) hypertension: Secondary | ICD-10-CM

## 2017-08-29 DIAGNOSIS — E785 Hyperlipidemia, unspecified: Secondary | ICD-10-CM

## 2017-08-29 DIAGNOSIS — Z9861 Coronary angioplasty status: Secondary | ICD-10-CM | POA: Diagnosis not present

## 2017-08-29 DIAGNOSIS — Z789 Other specified health status: Secondary | ICD-10-CM | POA: Diagnosis not present

## 2017-08-29 DIAGNOSIS — F5102 Adjustment insomnia: Secondary | ICD-10-CM | POA: Diagnosis not present

## 2017-08-29 DIAGNOSIS — I878 Other specified disorders of veins: Secondary | ICD-10-CM | POA: Diagnosis not present

## 2017-08-29 DIAGNOSIS — I2510 Atherosclerotic heart disease of native coronary artery without angina pectoris: Secondary | ICD-10-CM

## 2017-08-29 DIAGNOSIS — E669 Obesity, unspecified: Secondary | ICD-10-CM

## 2017-08-29 DIAGNOSIS — I208 Other forms of angina pectoris: Secondary | ICD-10-CM | POA: Diagnosis not present

## 2017-08-29 MED ORDER — CO Q-10 100 MG PO CAPS: 300.0000 | ORAL_CAPSULE | Freq: Three times a day (TID) | ORAL | 11 refills | Status: DC

## 2017-08-29 MED ORDER — FISH OIL 1000 MG PO CAPS: 3.0000 | ORAL_CAPSULE | Freq: Every day | ORAL | 11 refills | Status: DC

## 2017-08-29 MED ORDER — TRIAZOLAM 0.125 MG PO TABS: 0.1875 mg | ORAL_TABLET | Freq: Every day | ORAL | 5 refills | Status: DC

## 2017-08-29 NOTE — Progress Notes (Signed)
PCP: Redmond School, MD  Clinic Note: Chief Complaint  Patient presents with  . Follow-up    Almost 2-year  . Coronary Artery Disease    Mild baseline exertional angina    HPI: Arthur Lutz is a 77 y.o. male with a PMH below who presents today for 37-month follow-up f/u for CAD-PCI.  He is a former patient of Dr. Chase Picket, who's CAD history dates back to 2009 had a "mild heart attack".   2009: He underwent PCI of the RCA & balloon PTCA to the ostium of the first diagonal branch.   In January 2013 he then had PCI of the Circumflex and Cutting Balloon PTCA of the a diagonal branch.   These sites were all patent in Augu  2013 repeat cardiac catheterization.   He had a negative Myoview in February 2014 as part of preoperative risk assessment.   JSOEPH PODESTA was last seen in April 2017.  He was doing fairly well at that time.  Was limited by knee pain.  No significant chest pain or dyspnea.  He did get short of breath if he overexerts himself, walking up hills etc.  Mild end of day edema.  No orthopnea or PND.  --In the interim since I last saw him, he has been diagnosed with urinary bladder cancer.  Recent Hospitalizations: n/a  Studies Reviewed: n/a  Interval History: [name] returns today for delayed follow-up.  Overall, he has been doing quite well for the last 2 years or so.  He says that he will occasionally have some chest tightness and shortness of breath if he exerts himself enough to where his heart rate goes up and he is stressed himself.  He says he feels a fullness in his chest and makes it harder to breathe.  This goes away soon after stopping and allowing his heart rate to slow down.  With routine activities including climbing stairs without carrying something, he is doing fine.  He has not had to use any nitroglycerin.  More often he is limited by his knee pain, and thinks that that may be the reason why he has gained weight and is more deconditioned.  He does  not do a lot of exercise now including walking, especially doing stairs or walking up an incline because of his knees.  He denies any resting chest tightness or pressure.  No PND, orthopnea or edema.  Rare fleeting palpitations but nothing sustained.  May be some mild positional dizziness but no baseline lightheadedness, dizziness or wooziness.  No syncope/near syncopal or TIA/emesis fugax..  No claudication.  edema ROS: A comprehensive was performed. Review of Systems  Constitutional: Negative for diaphoresis, fever and malaise/fatigue.  HENT: Negative for congestion and nosebleeds.   Eyes:       Currently still getting shots for his macular degeneration.  Respiratory: Negative for cough, shortness of breath and wheezing.   Cardiovascular: Negative for claudication.       Symptoms suggestive of stable exertional angina  Gastrointestinal: Negative for abdominal pain, blood in stool, heartburn and melena.  Genitourinary: Negative for hematuria.  Musculoskeletal: Positive for joint pain (Usual knee and ankle pain.).  Neurological: Positive for tingling (He does have some peripheral neuropathy in his feet. He is very careful with his treatment of toenails and watching for injury.). Negative for dizziness, speech change, focal weakness, seizures, loss of consciousness, weakness and headaches.  Endo/Heme/Allergies: Does not bruise/bleed easily.  Psychiatric/Behavioral: Negative for depression and memory loss. The patient is  not nervous/anxious and does not have insomnia.   All other systems reviewed and are negative.   Past Medical History:  Diagnosis Date  . Allergic rhinitis   . Aortic valve sclerosis    Cause of murmur  . Arthritis of knee, left, needs total Knee in future with Dr. Wynelle Link  08/07/2011  . Bladder cancer ([place]) 11/2005  . CAD S/P percutaneous coronary angioplasty 08/07/2011   NEW 08/07/11 cutting balloon PTCA to 95% ostial diag. and 90%  proximal LCX.  prior cutting balloon and to  RCA  & ostium of ostial diag; Last Cath 02/2012 - Patent Diag PTCA, RCA & Circ BMS stents. Normal EF & EDP; Myoview 2/14 no evidence of ischemia or infarction  . Diabetes mellitus type 2, controlled, with complications ([place])   . Diabetes mellitus without complication ([place])   . Dyslipidemia   . History of heart attack 11/2007   "mild; did not affect the heart muscle"; PCI to RCA, Cutting PTCA Diag ostium  . Hypertension   . Macular degeneration   . Macular degeneration of right eye    Now getting shots.   . Melanoma of skin ([place]) ~ 2005   "level 2; on my back"  . Myocardial infarction ([place]) 2012  . Osteoarthritis    Status post right knee arthroplasty, positive for staph infection. Pending right arthroplasty  . Portacath in place 08/11/2012  . Statin intolerance 08/07/2011  . Stroke [facility]) 1980's   "had facial strokes; 2; about a year apart; never did find what caused them"    Past Surgical History:  Procedure Laterality Date  . CARDIAC CATHETERIZATION  August '13   Widely patent RCA and LCx stents. Also patent PTCA site to D1, ~40-50% D2. Normal EF, normal EDP  . CATARACT EXTRACTION W/ INTRAOCULAR LENS IMPLANT  ~ 2010   right  . CATARACT EXTRACTION W/PHACO Left 11/21/2016   Procedure: CATARACT EXTRACTION PHACO AND INTRAOCULAR LENS PLACEMENT (IOC) CDE - 11.70 ;  Surgeon: Tonny Branch, MD;  Location: [facility] ORS;  Service: Ophthalmology;  Laterality: Left;  left  . cold cup removal bladder lesion  11/2005   "malignant"  . COLONOSCOPY N/A 12/09/2014   Procedure: COLONOSCOPY;  Surgeon: Rogene Houston, MD;  Location: [facility] ENDO SUITE;  Service: Endoscopy;  Laterality: N/A;  955 - moved to 8:30 - Ann to notify pt  . CORONARY ANGIOPLASTY WITH STENT PLACEMENT  11/2007   Mid RCA - Driver BMS 3.5 mm x 15 mm; 2.0 mm Cutting Balloon PTCA - ostial D1  . CORONARY ANGIOPLASTY WITH STENT PLACEMENT  08/07/11   Abnormal TM Myoview - for exertional throat discomfort: Patent RCA stent, 90% circumflex -- Vision BMS 3.5 mm  x 12 mm --> 4.2 mm. Ostial D2 95% -- 2.25 mm Cutting Balloon PTCA  . DOPPLER ECHOCARDIOGRAPHY  May 2009   Normal EF, impaired relaxation, with sclerotic aortic valve. No stenosis.  . INGUINAL HERNIA REPAIR  ~ 1970   left  . KNEE ARTHROSCOPY  02/20/11   left  . LEFT HEART CATHETERIZATION WITH CORONARY ANGIOGRAM N/A 08/07/2011   Procedure: LEFT HEART CATHETERIZATION WITH CORONARY ANGIOGRAM;  Surgeon: Leonie Man, MD;  Location: [facility] CATH LAB;  Service: Cardiovascular;  Laterality: N/A;  . LEFT HEART CATHETERIZATION WITH CORONARY ANGIOGRAM N/A 03/06/2012   Procedure: LEFT HEART CATHETERIZATION WITH CORONARY ANGIOGRAM;  Surgeon: Leonie Man, MD;  Location: [facility] CATH LAB;  Service: Cardiovascular;  Laterality: N/A;  . NM MYOVIEW LTD  Jan. 31, 2014   Low  risk, EF 49%, improved from prior. Small apical and apical septal defect, fixed likely artifact no skin or infarction.  Marland Kitchen PARTIAL KNEE ARTHROPLASTY  06/2003   right  . PERCUTANEOUS CORONARY STENT INTERVENTION (PCI-S)  08/07/2011   Procedure: PERCUTANEOUS CORONARY STENT INTERVENTION (PCI-S);  Surgeon: Leonie Man, MD;  Location: [facility] CATH LAB;  Service: Cardiovascular;;  . TONSILLECTOMY     "as a child"  . TOTAL KNEE ARTHROPLASTY  11/2003   right x2 and one on left- Total of 3.  . TOTAL KNEE ARTHROPLASTY Left 09/28/2012   Procedure: TOTAL KNEE ARTHROPLASTY;  Surgeon: Gearlean Alf, MD;  Location: [facility] ORS;  Service: Orthopedics;  Laterality: Left;    Current Outpatient Medications on File Prior to Visit  Medication Sig Dispense Refill  . amLODipine (NORVASC) 10 MG tablet Take 1 tablet (10 mg total) by mouth daily. NEED OV. 30 tablet 1  . Ascorbic Acid (VITAMIN C) 1000 MG tablet Take 1,000 mg by mouth daily.    . clobetasol cream (TEMOVATE) 6.14 % Apply 1 application topically daily as needed.   2  . HYDROcodone-acetaminophen (NORCO/VICODIN) 5-325 MG tablet Take 1 tablet by mouth every 6 (six) hours as needed for severe pain. 10 tablet 0  .  ibuprofen (ADVIL,MOTRIN) 600 MG tablet Take 1 tablet (600 mg total) by mouth every 6 (six) hours as needed. 30 tablet 0  . isosorbide mononitrate (IMDUR) 60 MG 24 hr tablet TAKE 1 TABLET DAILY. NEEDS APPT. FOR FUTURE REFILLS 30 tablet 2  . losartan-hydrochlorothiazide (HYZAAR) 100-25 MG tablet TAKE 1/2 TABLET BY MOUTH DAILY 45 tablet 0  . Melatonin 10 MG CAPS Take 10 tablets by mouth at bedtime. For sleep     . meloxicam (MOBIC) 7.5 MG tablet Take 7.5 mg by mouth daily.    . metFORMIN (GLUCOPHAGE) 500 MG tablet Take 500 mg by mouth daily with breakfast.     . Multiple Vitamins-Minerals (EYE-VITES PO) Take by mouth. Tozol    . Multiple Vitamins-Minerals (PRESERVISION AREDS PO) Take 1 capsule by mouth daily.     Marland Kitchen nystatin-triamcinolone (MYCOLOG II) cream Apply 1 application topically 2 (two) times daily. APPLY TO AFFECTED AREA    . simvastatin (ZOCOR) 10 MG tablet Take 0.5 tablets (5 mg total) by mouth daily at 6 PM. Take 1/2 tablet a day by mouth (Patient taking differently: Take 10 mg by mouth daily. Take 1/2 tablet a day by mouth) 45 tablet 1  . tamsulosin (FLOMAX) 0.4 MG CAPS capsule Take 0.4 mg by mouth every morning.     No current facility-administered medications on file prior to visit.   -- GETS INTERMITTENT SHOTS FOR MACULAR DEGENERATION  Allergies  Allergen Reactions  . Statins Other (See Comments)    Myalgias, tired crestor -aches    Social History   Tobacco Use  . Smoking status: Former Smoker    Packs/day: 2.00    Years: 25.00    Pack years: 50.00    Types: Cigarettes    Last attempt to quit: 07/29/1980    Years since quitting: 37.1  . Smokeless tobacco: Never Used  Substance Use Topics  . Alcohol use: Yes    Alcohol/week: 1.1 oz    Types: 1 Glasses of wine, 1 Standard drinks or equivalent per week    Comment:  "wine or whiskey" occasionally  . Drug use: No     Social History   Social History Narrative   He is a married father of 2, grandfather of 10. He is  not  really getting a lot of exercise now. He previously had been working out on a treadmill at least 2 times a day for 15 minutes at a time, but he   is not able to do that as much now because his knee hurts him too bad. Does not smoke and only occasional alcoholic beverage.    family history includes Emphysema in his father; Lung cancer in his paternal uncle.   Wt Readings from Last 3 Encounters:  08/29/17 259 lb (117.5 kg)  02/01/17 247 lb (112 kg)  11/15/16 249 lb (112.9 kg)    PHYSICAL EXAM BP 138/72   Pulse (!) 42   Ht 6' (1.829 m)   Wt 259 lb (117.5 kg)   BMI 35.13 kg/m   Physical Exam  Constitutional: He is oriented to person, place, and time. He appears well-developed and well-nourished. No distress.  Well-groomed.  Obese  HENT:  Head: Normocephalic and atraumatic.  Mouth/Throat: No oropharyngeal exudate.  Neck: No hepatojugular reflux and no JVD present. Carotid bruit is not present.  Cardiovascular: Normal rate, regular rhythm and normal pulses.  No extrasystoles are present. PMI is not displaced. Exam reveals no gallop and no friction rub.  Murmur heard.  Medium-pitched harsh crescendo-decrescendo early systolic murmur is present with a grade of 1/6 at the upper right sternal border radiating to the neck. Pulmonary/Chest: Effort normal and breath sounds normal. No respiratory distress.  Abdominal: Soft. Bowel sounds are normal. He exhibits no distension. There is no tenderness.  Protuberant, obese abdomen; no HSM  Musculoskeletal: Normal range of motion. He exhibits edema (Trivial with mild venous stasis dermatitis changes.).  Neurological: He is alert and oriented to person, place, and time.  Skin: Skin is warm and dry. No erythema.  Psychiatric: He has a normal mood and affect. His behavior is normal. Judgment and thought content normal.  Nursing note and vitals reviewed.   Adult ECG Report  Rate: 42;  Rhythm: Sinus bradycardia, borderline incomplete right bundle  branch block.  nonspecific ST-T wave change. narrative Interpretation: Stable EKG otherwise   Other studies Reviewed: Additional studies/ records that were reviewed today include:  Recent Labs: BUN/creatinine 14/1.03.  Glucose 129, A1c 6.5.  Lab Results  Component Value Date  F[place] risk -20%   CHOL 176 06/12/2017    HDL 43     LDLCALC 109*     TRIG 122      ASSESSMENT / PLAN: Problem List Items Addressed This Visit    CAD S/P percutaneous coronary angioplasty: 08/07/11 cutting balloon PTCA to 95% ostial diag. and 90%  proximal LCX.  prior cutting balloon to RCA  & ostium of ostial diag., 2009  - Primary (Chronic)    Pretty much stable with only mild anginal symptoms with significant exertion.  No real change from baseline.  Unfortunately with his bradycardia we had to wean off the Toprol which is already at low dose anyway.  Continue amlodipine and Imdur along with losartan. Continue minimal dosage of simvastatin, may require more aggressive treatment. Continue aspirin.  (Not listed)      Relevant Orders   EKG 12-Lead   Chronic stable angina (HCC) - negative Myoview & no new Dz on Cath (Chronic)    Stable symptoms.  Probably related to microvascular disease as the Myoview done to evaluate this couple years ago was negative for ischemia.  No real change in symptoms.  Stable on 60 mg of Imdur and amlodipine.  Unfortunately we will have to wean off  of the beta-blocker. -If symptoms worsen, would need to probably titrate up Imdur.      Relevant Orders   EKG 12-Lead   Essential hypertension (Chronic)    Pretty well controlled, at upper limit of normal today on current medications.  Need to monitor as he back off on the beta-blocker to see if there is any increase in blood pressure if so may need to go to full dose of lisinopril-HCTZ.  -We will have blood pressure rechecked when he goes in for lipid clinic evaluation in 2 weeks.      Relevant Orders   EKG 12-Lead   Hyperlipemia,  intolerant to statins. Goal LDL < 70 (Chronic)    Unfortunately, his lipid panel is not adequately controlled on minimal dose of statin (5 mg).  -Start co-Q10, titrate up to 300 mg a day and increase Fish Oil to 3000 mg daily.  REFER TO [facility] (Cardiovascular Risk Reduction clinic --will also have EKG and BP check done at that time)      Insomnia due to stress (Chronic)    He has been using Halcion for years as needed for helping him sleep.  Unfortunately, he is having a hard time getting this refilled.  He does not use it very much, and I feel safe refilling it for him.      OA (osteoarthritis) of knee (Chronic)    He has routine flares of arthritis in his knees and ankles.  Usually prednisone Dosepak helps.  Again this is something that he does have a hard time getting from his PCP.  Will refill for him.      Relevant Medications   meloxicam (MOBIC) 7.5 MG tablet   Obesity (BMI 30.0-34.9) (Chronic)    Limited exercise because of his knees.  Suggested water aerobics, but needs to adjust diet as well.      Sinus bradycardia    Heart rates in the 40s on beta-blocker.  Need to simply wean off beta-blocker to avoid worsening or symptomatic Bradycardia.  Recheck EKG in 2 weeks when he comes in for cholesterol check.      Statin intolerance (Chronic)    Refer to CV RR      Venous stasis of both lower extremities (Chronic)    Stable finding.  He does use support stockings occasionally, but not routinely.         Current medicines are reviewed at length with the patient today. (+/- concerns) None  The following changes have been made: None Patient Instructions  Medication instructions  Wean off Metoprolol take  Every other day until next Friday Sep 05 2017,    --START TAKING CoQ10 100 MG  EVERY DAY  FOR ONE WEEK , AND AN ADDITIONAL 10 0MG   WEEKLY UNTIL YOU REACH 300 MG DAILY.  ---INCREASE FISH OIL TO 3000 MG CAPSULES DAILY.   No other changes  Your physician recommends  that you schedule a follow-up appointment in CVRR- CHOLESTEROL- NURSE WILL DO EKG, BLOOD PRESSURE CHECK -IN 2 WEEKS   Your physician wants you to follow-up in [place].You will receive a reminder letter in the mail two months in advance. If you don't receive a letter, please call our office to schedule the follow-up appointment.   Studies Ordered:   Orders Placed This Encounter  Procedures  . EKG 12-Lead      Glenetta Hew, M.D., M.S. Interventional Cardiologist    Pager # [phone] Phone # [phone]  [address] [place], [place] [address]

## 2017-08-29 NOTE — Patient Instructions (Addendum)
Medication instructions  Wean off Metoprolol take  Every other day until next Friday Sep 05 2017,    --START TAKING CoQ10 100 MG  EVERY DAY  FOR ONE WEEK , AND AN ADDITIONAL 10 0MG   WEEKLY UNTIL YOU REACH 300 MG DAILY.  ---INCREASE FISH OIL TO 3000 MG CAPSULES DAILY.   No other changes  Your physician recommends that you schedule a follow-up appointment in [facility]- CHOLESTEROL- NURSE WILL DO EKG, BLOOD PRESSURE CHECK -IN 2 WEEKS   Your physician wants you to follow-up in [place].You will receive a reminder letter in the mail two months in advance. If you don't receive a letter, please call our office to schedule the follow-up appointment.

## 2017-08-31 ENCOUNTER — Encounter: Payer: Self-pay | Admitting: Cardiology

## 2017-08-31 MED ORDER — PREDNISONE 5 MG (48) PO TBPK: ORAL_TABLET | ORAL | 3 refills | Status: DC

## 2017-08-31 NOTE — Assessment & Plan Note (Signed)
Stable finding.  He does use support stockings occasionally, but not routinely.

## 2017-08-31 NOTE — Assessment & Plan Note (Signed)
Stable symptoms.  Probably related to microvascular disease as the Myoview done to evaluate this couple years ago was negative for ischemia.  No real change in symptoms.  Stable on 60 mg of Imdur and amlodipine.  Unfortunately we will have to wean off of the beta-blocker. -If symptoms worsen, would need to probably titrate up Imdur.

## 2017-08-31 NOTE — Assessment & Plan Note (Signed)
Limited exercise because of his knees.  Suggested water aerobics, but needs to adjust diet as well.

## 2017-08-31 NOTE — Assessment & Plan Note (Addendum)
Unfortunately, his lipid panel is not adequately controlled on minimal dose of statin (5 mg).  -Start co-Q10, titrate up to 300 mg a day and increase Fish Oil to 3000 mg daily.  REFER TO [facility] (Cardiovascular Risk Reduction clinic --will also have EKG and BP check done at that time)

## 2017-08-31 NOTE — Assessment & Plan Note (Signed)
Pretty much stable with only mild anginal symptoms with significant exertion.  No real change from baseline.  Unfortunately with his bradycardia we had to wean off the Toprol which is already at low dose anyway.  Continue amlodipine and Imdur along with losartan. Continue minimal dosage of simvastatin, may require more aggressive treatment. Continue aspirin.  (Not listed)

## 2017-08-31 NOTE — Assessment & Plan Note (Addendum)
Pretty well controlled, at upper limit of normal today on current medications.  Need to monitor as he back off on the beta-blocker to see if there is any increase in blood pressure if so may need to go to full dose of lisinopril-HCTZ.  -We will have blood pressure rechecked when he goes in for lipid clinic evaluation in 2 weeks.

## 2017-08-31 NOTE — Assessment & Plan Note (Signed)
Refer to CV RR

## 2017-08-31 NOTE — Assessment & Plan Note (Signed)
He has been using Halcion for years as needed for helping him sleep.  Unfortunately, he is having a hard time getting this refilled.  He does not use it very much, and I feel safe refilling it for him.

## 2017-08-31 NOTE — Assessment & Plan Note (Addendum)
Heart rates in the 40s on beta-blocker.  Need to simply wean off beta-blocker to avoid worsening or symptomatic Bradycardia.  Recheck EKG in 2 weeks when he comes in for cholesterol check.

## 2017-08-31 NOTE — Assessment & Plan Note (Signed)
He has routine flares of arthritis in his knees and ankles.  Usually prednisone Dosepak helps.  Again this is something that he does have a hard time getting from his PCP.  Will refill for him.

## 2017-09-08 DIAGNOSIS — E6609 Other obesity due to excess calories: Secondary | ICD-10-CM | POA: Diagnosis not present

## 2017-09-08 DIAGNOSIS — Z6836 Body mass index (BMI) 36.0-36.9, adult: Secondary | ICD-10-CM | POA: Diagnosis not present

## 2017-09-08 DIAGNOSIS — J029 Acute pharyngitis, unspecified: Secondary | ICD-10-CM | POA: Diagnosis not present

## 2017-09-08 DIAGNOSIS — R51 Headache: Secondary | ICD-10-CM | POA: Diagnosis not present

## 2017-09-10 DIAGNOSIS — D485 Neoplasm of uncertain behavior of skin: Secondary | ICD-10-CM | POA: Diagnosis not present

## 2017-09-10 DIAGNOSIS — Z85820 Personal history of malignant melanoma of skin: Secondary | ICD-10-CM | POA: Diagnosis not present

## 2017-09-10 DIAGNOSIS — D225 Melanocytic nevi of trunk: Secondary | ICD-10-CM | POA: Diagnosis not present

## 2017-09-10 DIAGNOSIS — Z85828 Personal history of other malignant neoplasm of skin: Secondary | ICD-10-CM | POA: Diagnosis not present

## 2017-09-10 DIAGNOSIS — B079 Viral wart, unspecified: Secondary | ICD-10-CM | POA: Diagnosis not present

## 2017-09-11 ENCOUNTER — Ambulatory Visit (INDEPENDENT_AMBULATORY_CARE_PROVIDER_SITE_OTHER): Payer: Medicare Other

## 2017-09-11 ENCOUNTER — Ambulatory Visit: Payer: Medicare Other | Admitting: Pharmacist

## 2017-09-11 ENCOUNTER — Other Ambulatory Visit: Payer: Self-pay | Admitting: Cardiology

## 2017-09-11 DIAGNOSIS — I2510 Atherosclerotic heart disease of native coronary artery without angina pectoris: Secondary | ICD-10-CM

## 2017-09-11 DIAGNOSIS — Z9861 Coronary angioplasty status: Secondary | ICD-10-CM | POA: Diagnosis not present

## 2017-09-11 DIAGNOSIS — R001 Bradycardia, unspecified: Secondary | ICD-10-CM | POA: Diagnosis not present

## 2017-09-11 DIAGNOSIS — E785 Hyperlipidemia, unspecified: Secondary | ICD-10-CM | POA: Diagnosis not present

## 2017-09-11 MED ORDER — EZETIMIBE 10 MG PO TABS: 10.0000 mg | ORAL_TABLET | Freq: Every day | ORAL | 0 refills | Status: DC

## 2017-09-11 NOTE — Telephone Encounter (Signed)
REFILL 

## 2017-09-11 NOTE — Progress Notes (Signed)
Patient ID: JASIN BRAZEL                 DOB: 10/02/1940                    MRN: [id number]     HPI: Arthur Lutz is a 77 y.o. male patient referred to lipid clinic by Dr Ellyn Hack. PMH is significant for CAD s/p PCI, MI in 2009 and 2012, bladder cancer, diabetes type II, hyperlipidemia, macular degeneratio, stable angina, and stroke.  Mr Arthur Lutz is familiar with our service. He refused to come to see the pharmacist int he past. Today he presents to clinic for EKG and lipid follow up.  Reports taking simvastatin 10mg  daily (he self adjust his dome some time ago) and tolerating well so far. Denies problems with current therapy and undertands reasons to keep LDL as low as possible.  Current lipid management Medications:  Simvastatin 10 mg daily  Intolerances:  (myalgia) Rosuvastatin 10 mg daily  - aches Rosuvastatin 10mg  twice weekly Ezetimibe 10mg  daily - low energy?? (patient can not recall using this in the past)  LDL goal: 70 mg/dL  Family History: Emphysema in his father; Lung cancer in his paternal uncle  Social History: former smoker, 1 alcoholic drink   Labs: 51/88/4166: CHO 176; TG 122; HDL 43; LDL-c 109 (simvastatin 5mg  daily) 03/20/2016: CHO 150; TG 116; HDL 41; LDL-c 86 (ezetimibe 10mg  plus simvastatin 5mg )  Past Medical History:  Diagnosis Date  . Allergic rhinitis   . Aortic valve sclerosis    Cause of murmur  . Arthritis of knee, left, needs total Knee in future with Dr. Wynelle Link  08/07/2011  . Bladder cancer ([place]) 11/2005  . CAD S/P percutaneous coronary angioplasty 08/07/2011   NEW 08/07/11 cutting balloon PTCA to 95% ostial diag. and 90%  proximal LCX.  prior cutting balloon and to RCA  & ostium of ostial diag; Last Cath 02/2012 - Patent Diag PTCA, RCA & Circ BMS stents. Normal EF & EDP; Myoview 2/14 no evidence of ischemia or infarction  . Diabetes mellitus type 2, controlled, with complications ([place])   . Diabetes mellitus without complication ([place])   . Dyslipidemia   .  History of heart attack 11/2007   "mild; did not affect the heart muscle"; PCI to RCA, Cutting PTCA Diag ostium  . Hypertension   . Macular degeneration   . Macular degeneration of right eye    Now getting shots.   . Melanoma of skin ([place]) ~ 2005   "level 2; on my back"  . Myocardial infarction ([place]) 2012  . Osteoarthritis    Status post right knee arthroplasty, positive for staph infection. Pending right arthroplasty  . Portacath in place 08/11/2012  . Statin intolerance 08/07/2011  . Stroke [facility]) 1980's   "had facial strokes; 2; about a year apart; never did find what caused them"    Current Outpatient Medications on File Prior to Visit  Medication Sig Dispense Refill  . amLODipine (NORVASC) 10 MG tablet Take 1 tablet (10 mg total) by mouth daily. NEED OV. 30 tablet 1  . Ascorbic Acid (VITAMIN C) 1000 MG tablet Take 1,000 mg by mouth daily.    . clobetasol cream (TEMOVATE) 0.63 % Apply 1 application topically daily as needed.   2  . Coenzyme Q10 (CO Q-10) 100 MG CAPS Take 300 capsules by mouth 3 (three) times daily. 90 each 11  . HYDROcodone-acetaminophen (NORCO/VICODIN) 5-325 MG tablet Take 1 tablet by mouth every  6 (six) hours as needed for severe pain. 10 tablet 0  . ibuprofen (ADVIL,MOTRIN) 600 MG tablet Take 1 tablet (600 mg total) by mouth every 6 (six) hours as needed. 30 tablet 0  . isosorbide mononitrate (IMDUR) 60 MG 24 hr tablet TAKE 1 TABLET DAILY. NEEDS APPT. FOR FUTURE REFILLS 30 tablet 2  . losartan-hydrochlorothiazide (HYZAAR) 100-25 MG tablet TAKE 1/2 TABLET BY MOUTH DAILY 45 tablet 0  . Melatonin 10 MG CAPS Take 10 tablets by mouth at bedtime. For sleep     . meloxicam (MOBIC) 7.5 MG tablet Take 7.5 mg by mouth daily.    . metFORMIN (GLUCOPHAGE) 500 MG tablet Take 500 mg by mouth daily with breakfast.     . Multiple Vitamins-Minerals (EYE-VITES PO) Take by mouth. Tozol    . Multiple Vitamins-Minerals (PRESERVISION AREDS PO) Take 1 capsule by mouth daily.     Marland Kitchen  nystatin-triamcinolone (MYCOLOG II) cream Apply 1 application topically 2 (two) times daily. APPLY TO AFFECTED AREA    . Omega-3 Fatty Acids (FISH OIL) 1000 MG CAPS Take 3 capsules (3,000 mg total) by mouth daily. 90 capsule 11  . predniSONE (STERAPRED UNI-PAK 48 TAB) 5 MG (48) TBPK tablet Take Dosepak as needed for arthritis breakthrough 48 tablet 3  . tamsulosin (FLOMAX) 0.4 MG CAPS capsule Take 0.4 mg by mouth every morning.    . triazolam (HALCION) 0.125 MG tablet Take 1.5 tablets (0.1875 mg total) by mouth at bedtime. Pt takes 1.5 tabs at bedtime 30 tablet 5   No current facility-administered medications on file prior to visit.     Allergies  Allergen Reactions  . Statins Other (See Comments)    Myalgias, tired crestor -aches     Hyperlipemia, intolerant to statins. Goal LDL < 70 LDL elevated well above goal of 70mg /dL for secondary prevention. Patient not very receptive to pharmacist intervention or significant change in therapy at this time. He was more interested on EKG and refill of his azithromycin prescription.  I was able to complete some counseling related to his cholesterol. Patient already increased his simvastatin from 5mg  to 10mg  daily and reports tolerability. He agreed to add ezetimibe 10mg  daily to current simvastatin regimen and repeat fasting lipid panel in 6-8 weeks. Patient encouraged to contact clinic if problems with ezetimibe developed.   Dulcy Sida Rodriguez-Guzman PharmD, BCPS, [place] [id number] 09/12/2017 8:43 AM

## 2017-09-11 NOTE — Progress Notes (Signed)
Patient came in for a ekg only.Ekg was done Dr.Harding reviewed.Advised to continue same medications.

## 2017-09-12 ENCOUNTER — Telehealth: Payer: Self-pay | Admitting: Cardiology

## 2017-09-12 ENCOUNTER — Encounter: Payer: Self-pay | Admitting: Pharmacist

## 2017-09-12 MED ORDER — SIMVASTATIN 10 MG PO TABS: 10.0000 mg | ORAL_TABLET | Freq: Every day | ORAL | 1 refills | Status: DC

## 2017-09-12 MED ORDER — AZITHROMYCIN 250 MG PO TABS: 250.0000 mg | ORAL_TABLET | Freq: Every day | ORAL | 0 refills | Status: DC

## 2017-09-12 NOTE — Telephone Encounter (Signed)
Left message for pharmacy, simvastatin 10 mg daily.

## 2017-09-12 NOTE — Telephone Encounter (Signed)
New message    Pt c/o medication issue:  1. Name of Medication: simvastatin (ZOCOR) 10 MG tablet  2. How are you currently taking this medication (dosage and times per day)? Take 1 tablet (10 mg total) by mouth daily at 6 PM. Take 1/2 tablet a day by mouth  3. Are you having a reaction (difficulty breathing--STAT)? NO  4. What is your medication issue? Please clarify how patient is to take medication.

## 2017-09-12 NOTE — Assessment & Plan Note (Addendum)
LDL elevated well above goal of 70mg /dL for secondary prevention. Patient not very receptive to pharmacist intervention or significant change in therapy at this time. He was more interested on EKG and refill of his azithromycin prescription.  I was able to complete some counseling related to his cholesterol. Patient already increased his simvastatin from 5mg  to 10mg  daily and reports tolerability. He agreed to add ezetimibe 10mg  daily to current simvastatin regimen and repeat fasting lipid panel in 6-8 weeks. Patient encouraged to contact clinic if problems with ezetimibe developed.

## 2017-09-13 ENCOUNTER — Other Ambulatory Visit: Payer: Self-pay | Admitting: Cardiology

## 2017-09-26 DIAGNOSIS — H353213 Exudative age-related macular degeneration, right eye, with inactive scar: Secondary | ICD-10-CM | POA: Diagnosis not present

## 2017-09-26 DIAGNOSIS — H35422 Microcystoid degeneration of retina, left eye: Secondary | ICD-10-CM | POA: Diagnosis not present

## 2017-09-26 DIAGNOSIS — H353221 Exudative age-related macular degeneration, left eye, with active choroidal neovascularization: Secondary | ICD-10-CM | POA: Diagnosis not present

## 2017-09-26 DIAGNOSIS — H31091 Other chorioretinal scars, right eye: Secondary | ICD-10-CM | POA: Diagnosis not present

## 2017-09-29 ENCOUNTER — Telehealth: Payer: Self-pay | Admitting: Cardiology

## 2017-09-29 NOTE — Telephone Encounter (Signed)
New Message   Pt c/o medication issue:  1. Name of Medication: losartan-hydrochlorothiazide (HYZAAR) 100-25 MG tablet  2. How are you currently taking this medication (dosage and times per day)? TAKE 1/2 TABLET BY MOUTH DAILY  3. Are you having a reaction (difficulty breathing--STAT)? no  4. What is your medication issue? Pt received a letter from [facility], stating that his losartan is on recall. Pt would like an alternative sent to his pharmacy. Please call

## 2017-09-29 NOTE — Telephone Encounter (Signed)
I do not see that he has been intolerant to an ACEi in the past. Due to recalls would recommend change to lisinopril 40mg  and HCTZ 25mg  daily (they do not come as this dose formulated together). Check BP for 3-4 weeks after change if able otherwise can schedule in [facility] clinic.

## 2017-09-30 MED ORDER — HYDROCHLOROTHIAZIDE 25 MG PO TABS: 25.0000 mg | ORAL_TABLET | Freq: Every day | ORAL | 3 refills | Status: DC

## 2017-09-30 MED ORDER — LISINOPRIL 40 MG PO TABS: 40.0000 mg | ORAL_TABLET | Freq: Every day | ORAL | 3 refills | Status: DC

## 2017-09-30 NOTE — Telephone Encounter (Signed)
Spoke to patient, aware of recommendations and verbalized understanding.  Patient will monitor BP for 3-4 weeks and call if any significant change.  rx sent to pharmacy.

## 2017-10-10 DIAGNOSIS — N138 Other obstructive and reflux uropathy: Secondary | ICD-10-CM | POA: Diagnosis not present

## 2017-10-10 DIAGNOSIS — Z8551 Personal history of malignant neoplasm of bladder: Secondary | ICD-10-CM | POA: Diagnosis not present

## 2017-10-10 DIAGNOSIS — N401 Enlarged prostate with lower urinary tract symptoms: Secondary | ICD-10-CM | POA: Diagnosis not present

## 2017-10-10 DIAGNOSIS — R972 Elevated prostate specific antigen [PSA]: Secondary | ICD-10-CM | POA: Diagnosis not present

## 2017-10-21 DIAGNOSIS — E1140 Type 2 diabetes mellitus with diabetic neuropathy, unspecified: Secondary | ICD-10-CM | POA: Diagnosis not present

## 2017-10-21 DIAGNOSIS — E1151 Type 2 diabetes mellitus with diabetic peripheral angiopathy without gangrene: Secondary | ICD-10-CM | POA: Diagnosis not present

## 2017-10-29 DIAGNOSIS — H353213 Exudative age-related macular degeneration, right eye, with inactive scar: Secondary | ICD-10-CM | POA: Diagnosis not present

## 2017-10-29 DIAGNOSIS — H43813 Vitreous degeneration, bilateral: Secondary | ICD-10-CM | POA: Diagnosis not present

## 2017-10-29 DIAGNOSIS — H31091 Other chorioretinal scars, right eye: Secondary | ICD-10-CM | POA: Diagnosis not present

## 2017-10-29 DIAGNOSIS — H353221 Exudative age-related macular degeneration, left eye, with active choroidal neovascularization: Secondary | ICD-10-CM | POA: Diagnosis not present

## 2017-11-03 ENCOUNTER — Telehealth: Payer: Self-pay | Admitting: Cardiology

## 2017-11-03 NOTE — Telephone Encounter (Signed)
Returned the call to the patient. His wife stated that he was currently not home. He will call back when he arrives.

## 2017-11-03 NOTE — Telephone Encounter (Signed)
Patient called back stating that since his medications were changed that he has been having a problem with increased weakness and fatigue. He stated that he was changed to Lisinopril 40 mg and hydrochlorothiazide 25 mg and has been taking it for approximately 6 weeks.  He stated that his blood pressure runs consistently in the 130's/ 60's. He denies any other symptoms, just fatigue. He wants to know if there is anything else he can take. Message routed.

## 2017-11-03 NOTE — Telephone Encounter (Signed)
Arthur Lutz is calling because his blood pressure was changed a while ago . He is not feeling well, blood pressure is fine and he is taking Metoprolol and seem to think that is causing him not to feel well . Not sure if he needs to continue to take it . Wants to know can he stop taking the medication . Please call

## 2017-11-03 NOTE — Telephone Encounter (Signed)
This is not a usual side effect of Lisinopril -- maybe cut the dose in 1/2 to see if he just needs a bit more BP.  Lisinopril should be just like Losartan.  Glenetta Hew, MD

## 2017-11-04 MED ORDER — LISINOPRIL 40 MG PO TABS: 20.0000 mg | ORAL_TABLET | Freq: Every day | ORAL | 3 refills | Status: DC

## 2017-11-04 NOTE — Telephone Encounter (Addendum)
SPOKE TO PATIENT . INFORMATION GIVEN TO REDUCE LISINOPRIL TO 20 MG ( 1/2 of 40 mg tablet).  patient verbalized understanding. Will call back if needed.

## 2017-11-12 DIAGNOSIS — S90453A Superficial foreign body, unspecified great toe, initial encounter: Secondary | ICD-10-CM | POA: Diagnosis not present

## 2017-11-12 DIAGNOSIS — L02611 Cutaneous abscess of right foot: Secondary | ICD-10-CM | POA: Diagnosis not present

## 2017-11-12 DIAGNOSIS — M79671 Pain in right foot: Secondary | ICD-10-CM | POA: Diagnosis not present

## 2017-11-17 DIAGNOSIS — M79672 Pain in left foot: Secondary | ICD-10-CM | POA: Diagnosis not present

## 2017-11-17 DIAGNOSIS — M25579 Pain in unspecified ankle and joints of unspecified foot: Secondary | ICD-10-CM | POA: Diagnosis not present

## 2017-11-28 DIAGNOSIS — H31011 Macula scars of posterior pole (postinflammatory) (post-traumatic), right eye: Secondary | ICD-10-CM | POA: Diagnosis not present

## 2017-11-28 DIAGNOSIS — H353221 Exudative age-related macular degeneration, left eye, with active choroidal neovascularization: Secondary | ICD-10-CM | POA: Diagnosis not present

## 2017-11-28 DIAGNOSIS — H43813 Vitreous degeneration, bilateral: Secondary | ICD-10-CM | POA: Diagnosis not present

## 2017-12-04 DIAGNOSIS — E782 Mixed hyperlipidemia: Secondary | ICD-10-CM | POA: Diagnosis not present

## 2017-12-04 DIAGNOSIS — E6609 Other obesity due to excess calories: Secondary | ICD-10-CM | POA: Diagnosis not present

## 2017-12-04 DIAGNOSIS — J329 Chronic sinusitis, unspecified: Secondary | ICD-10-CM | POA: Diagnosis not present

## 2017-12-04 DIAGNOSIS — Z6836 Body mass index (BMI) 36.0-36.9, adult: Secondary | ICD-10-CM | POA: Diagnosis not present

## 2017-12-05 ENCOUNTER — Encounter (HOSPITAL_COMMUNITY)
Admission: RE | Admit: 2017-12-05 | Discharge: 2017-12-05 | Disposition: A | Discharge status: Home / Self Care | Payer: Medicare Other | Source: Ambulatory Visit | Attending: Family Medicine | Admitting: Family Medicine

## 2017-12-05 DIAGNOSIS — Z452 Encounter for adjustment and management of vascular access device: Secondary | ICD-10-CM | POA: Diagnosis present

## 2017-12-05 MED ORDER — HEPARIN SOD (PORK) LOCK FLUSH 100 UNIT/ML IV SOLN
500.0000 [IU] | Freq: Once | INTRAVENOUS | Status: AC
  Administered 2017-12-05: 500 [IU] via INTRAVENOUS

## 2017-12-05 MED ORDER — HEPARIN SOD (PORK) LOCK FLUSH 100 UNIT/ML IV SOLN
INTRAVENOUS | Status: AC
  Filled 2017-12-05: qty 5

## 2017-12-23 ENCOUNTER — Other Ambulatory Visit: Payer: Self-pay | Admitting: *Deleted

## 2017-12-23 MED ORDER — ISOSORBIDE MONONITRATE ER 60 MG PO TB24: ORAL_TABLET | ORAL | 2 refills | Status: DC

## 2017-12-30 DIAGNOSIS — E1151 Type 2 diabetes mellitus with diabetic peripheral angiopathy without gangrene: Secondary | ICD-10-CM | POA: Diagnosis not present

## 2017-12-30 DIAGNOSIS — E1140 Type 2 diabetes mellitus with diabetic neuropathy, unspecified: Secondary | ICD-10-CM | POA: Diagnosis not present

## 2018-01-02 DIAGNOSIS — H35422 Microcystoid degeneration of retina, left eye: Secondary | ICD-10-CM | POA: Diagnosis not present

## 2018-01-02 DIAGNOSIS — H353213 Exudative age-related macular degeneration, right eye, with inactive scar: Secondary | ICD-10-CM | POA: Diagnosis not present

## 2018-01-02 DIAGNOSIS — H353221 Exudative age-related macular degeneration, left eye, with active choroidal neovascularization: Secondary | ICD-10-CM | POA: Diagnosis not present

## 2018-01-02 DIAGNOSIS — H31091 Other chorioretinal scars, right eye: Secondary | ICD-10-CM | POA: Diagnosis not present

## 2018-01-08 DIAGNOSIS — E6609 Other obesity due to excess calories: Secondary | ICD-10-CM | POA: Diagnosis not present

## 2018-01-08 DIAGNOSIS — R972 Elevated prostate specific antigen [PSA]: Secondary | ICD-10-CM | POA: Diagnosis not present

## 2018-01-08 DIAGNOSIS — I2510 Atherosclerotic heart disease of native coronary artery without angina pectoris: Secondary | ICD-10-CM | POA: Diagnosis not present

## 2018-01-08 DIAGNOSIS — E1129 Type 2 diabetes mellitus with other diabetic kidney complication: Secondary | ICD-10-CM | POA: Diagnosis not present

## 2018-01-08 DIAGNOSIS — Z6835 Body mass index (BMI) 35.0-35.9, adult: Secondary | ICD-10-CM | POA: Diagnosis not present

## 2018-02-11 DIAGNOSIS — H31091 Other chorioretinal scars, right eye: Secondary | ICD-10-CM | POA: Diagnosis not present

## 2018-02-11 DIAGNOSIS — H353221 Exudative age-related macular degeneration, left eye, with active choroidal neovascularization: Secondary | ICD-10-CM | POA: Diagnosis not present

## 2018-02-11 DIAGNOSIS — H353213 Exudative age-related macular degeneration, right eye, with inactive scar: Secondary | ICD-10-CM | POA: Diagnosis not present

## 2018-02-11 DIAGNOSIS — H35422 Microcystoid degeneration of retina, left eye: Secondary | ICD-10-CM | POA: Diagnosis not present

## 2018-02-16 DIAGNOSIS — J310 Chronic rhinitis: Secondary | ICD-10-CM | POA: Diagnosis not present

## 2018-02-16 DIAGNOSIS — H6123 Impacted cerumen, bilateral: Secondary | ICD-10-CM | POA: Diagnosis not present

## 2018-02-16 DIAGNOSIS — J342 Deviated nasal septum: Secondary | ICD-10-CM | POA: Diagnosis not present

## 2018-03-02 DIAGNOSIS — M542 Cervicalgia: Secondary | ICD-10-CM | POA: Diagnosis not present

## 2018-03-02 DIAGNOSIS — M545 Low back pain: Secondary | ICD-10-CM | POA: Diagnosis not present

## 2018-03-03 ENCOUNTER — Other Ambulatory Visit (HOSPITAL_COMMUNITY): Payer: Self-pay | Admitting: Orthopedic Surgery

## 2018-03-03 DIAGNOSIS — M5416 Radiculopathy, lumbar region: Secondary | ICD-10-CM

## 2018-03-10 ENCOUNTER — Ambulatory Visit (HOSPITAL_COMMUNITY): Payer: Medicare Other

## 2018-03-10 DIAGNOSIS — E1151 Type 2 diabetes mellitus with diabetic peripheral angiopathy without gangrene: Secondary | ICD-10-CM | POA: Diagnosis not present

## 2018-03-10 DIAGNOSIS — E1140 Type 2 diabetes mellitus with diabetic neuropathy, unspecified: Secondary | ICD-10-CM | POA: Diagnosis not present

## 2018-03-11 ENCOUNTER — Ambulatory Visit (HOSPITAL_COMMUNITY)
Admission: RE | Admit: 2018-03-11 | Discharge: 2018-03-11 | Disposition: A | Discharge status: Home / Self Care | Payer: Medicare Other | Source: Ambulatory Visit | Attending: Orthopedic Surgery | Admitting: Orthopedic Surgery

## 2018-03-11 DIAGNOSIS — M5416 Radiculopathy, lumbar region: Secondary | ICD-10-CM | POA: Insufficient documentation

## 2018-03-11 DIAGNOSIS — M545 Low back pain: Secondary | ICD-10-CM | POA: Diagnosis not present

## 2018-03-11 DIAGNOSIS — M48061 Spinal stenosis, lumbar region without neurogenic claudication: Secondary | ICD-10-CM | POA: Insufficient documentation

## 2018-03-11 DIAGNOSIS — M4807 Spinal stenosis, lumbosacral region: Secondary | ICD-10-CM | POA: Insufficient documentation

## 2018-03-13 DIAGNOSIS — H353213 Exudative age-related macular degeneration, right eye, with inactive scar: Secondary | ICD-10-CM | POA: Diagnosis not present

## 2018-03-13 DIAGNOSIS — H353221 Exudative age-related macular degeneration, left eye, with active choroidal neovascularization: Secondary | ICD-10-CM | POA: Diagnosis not present

## 2018-03-13 DIAGNOSIS — H35422 Microcystoid degeneration of retina, left eye: Secondary | ICD-10-CM | POA: Diagnosis not present

## 2018-03-13 DIAGNOSIS — H31091 Other chorioretinal scars, right eye: Secondary | ICD-10-CM | POA: Diagnosis not present

## 2018-03-20 DIAGNOSIS — M545 Low back pain: Secondary | ICD-10-CM | POA: Diagnosis not present

## 2018-03-20 DIAGNOSIS — M542 Cervicalgia: Secondary | ICD-10-CM | POA: Diagnosis not present

## 2018-03-24 DIAGNOSIS — Z85820 Personal history of malignant melanoma of skin: Secondary | ICD-10-CM | POA: Diagnosis not present

## 2018-03-24 DIAGNOSIS — L570 Actinic keratosis: Secondary | ICD-10-CM | POA: Diagnosis not present

## 2018-03-24 DIAGNOSIS — Z85828 Personal history of other malignant neoplasm of skin: Secondary | ICD-10-CM | POA: Diagnosis not present

## 2018-03-24 DIAGNOSIS — L409 Psoriasis, unspecified: Secondary | ICD-10-CM | POA: Diagnosis not present

## 2018-04-06 DIAGNOSIS — J3089 Other allergic rhinitis: Secondary | ICD-10-CM | POA: Diagnosis not present

## 2018-04-06 DIAGNOSIS — J309 Allergic rhinitis, unspecified: Secondary | ICD-10-CM | POA: Diagnosis not present

## 2018-04-10 DIAGNOSIS — H353213 Exudative age-related macular degeneration, right eye, with inactive scar: Secondary | ICD-10-CM | POA: Diagnosis not present

## 2018-04-10 DIAGNOSIS — H353221 Exudative age-related macular degeneration, left eye, with active choroidal neovascularization: Secondary | ICD-10-CM | POA: Diagnosis not present

## 2018-04-10 DIAGNOSIS — H35422 Microcystoid degeneration of retina, left eye: Secondary | ICD-10-CM | POA: Diagnosis not present

## 2018-04-10 DIAGNOSIS — H31091 Other chorioretinal scars, right eye: Secondary | ICD-10-CM | POA: Diagnosis not present

## 2018-04-17 DIAGNOSIS — C672 Malignant neoplasm of lateral wall of bladder: Secondary | ICD-10-CM | POA: Diagnosis not present

## 2018-04-17 DIAGNOSIS — R972 Elevated prostate specific antigen [PSA]: Secondary | ICD-10-CM | POA: Diagnosis not present

## 2018-04-17 DIAGNOSIS — N138 Other obstructive and reflux uropathy: Secondary | ICD-10-CM | POA: Diagnosis not present

## 2018-04-17 DIAGNOSIS — N401 Enlarged prostate with lower urinary tract symptoms: Secondary | ICD-10-CM | POA: Diagnosis not present

## 2018-05-13 DIAGNOSIS — H353213 Exudative age-related macular degeneration, right eye, with inactive scar: Secondary | ICD-10-CM | POA: Diagnosis not present

## 2018-05-13 DIAGNOSIS — H353221 Exudative age-related macular degeneration, left eye, with active choroidal neovascularization: Secondary | ICD-10-CM | POA: Diagnosis not present

## 2018-05-13 DIAGNOSIS — H31091 Other chorioretinal scars, right eye: Secondary | ICD-10-CM | POA: Diagnosis not present

## 2018-05-13 DIAGNOSIS — H35422 Microcystoid degeneration of retina, left eye: Secondary | ICD-10-CM | POA: Diagnosis not present

## 2018-05-26 DIAGNOSIS — E1140 Type 2 diabetes mellitus with diabetic neuropathy, unspecified: Secondary | ICD-10-CM | POA: Diagnosis not present

## 2018-05-26 DIAGNOSIS — E1151 Type 2 diabetes mellitus with diabetic peripheral angiopathy without gangrene: Secondary | ICD-10-CM | POA: Diagnosis not present

## 2018-06-15 DIAGNOSIS — Z1389 Encounter for screening for other disorder: Secondary | ICD-10-CM | POA: Diagnosis not present

## 2018-06-15 DIAGNOSIS — Z0001 Encounter for general adult medical examination with abnormal findings: Secondary | ICD-10-CM | POA: Diagnosis not present

## 2018-06-15 DIAGNOSIS — E119 Type 2 diabetes mellitus without complications: Secondary | ICD-10-CM | POA: Diagnosis not present

## 2018-06-15 DIAGNOSIS — M79675 Pain in left toe(s): Secondary | ICD-10-CM | POA: Diagnosis not present

## 2018-06-15 DIAGNOSIS — E6609 Other obesity due to excess calories: Secondary | ICD-10-CM | POA: Diagnosis not present

## 2018-06-15 DIAGNOSIS — Z6836 Body mass index (BMI) 36.0-36.9, adult: Secondary | ICD-10-CM | POA: Diagnosis not present

## 2018-06-23 DIAGNOSIS — H353221 Exudative age-related macular degeneration, left eye, with active choroidal neovascularization: Secondary | ICD-10-CM | POA: Diagnosis not present

## 2018-06-23 DIAGNOSIS — H353213 Exudative age-related macular degeneration, right eye, with inactive scar: Secondary | ICD-10-CM | POA: Diagnosis not present

## 2018-06-23 DIAGNOSIS — H31091 Other chorioretinal scars, right eye: Secondary | ICD-10-CM | POA: Diagnosis not present

## 2018-06-23 DIAGNOSIS — H35422 Microcystoid degeneration of retina, left eye: Secondary | ICD-10-CM | POA: Diagnosis not present

## 2018-07-01 ENCOUNTER — Encounter (HOSPITAL_COMMUNITY): Payer: Self-pay

## 2018-07-01 ENCOUNTER — Encounter (HOSPITAL_COMMUNITY)
Admission: RE | Admit: 2018-07-01 | Discharge: 2018-07-01 | Disposition: A | Discharge status: Home / Self Care | Payer: Medicare Other | Source: Ambulatory Visit | Attending: Family Medicine | Admitting: Family Medicine

## 2018-07-01 DIAGNOSIS — Z452 Encounter for adjustment and management of vascular access device: Secondary | ICD-10-CM | POA: Diagnosis not present

## 2018-07-01 MED ORDER — HEPARIN SOD (PORK) LOCK FLUSH 100 UNIT/ML IV SOLN
500.0000 [IU] | Freq: Once | INTRAVENOUS | Status: AC
  Administered 2018-07-01: 500 [IU] via INTRAVENOUS

## 2018-07-01 MED ORDER — HEPARIN SOD (PORK) LOCK FLUSH 100 UNIT/ML IV SOLN
INTRAVENOUS | Status: AC
  Filled 2018-07-01: qty 5

## 2018-07-01 NOTE — Progress Notes (Signed)
Maintenance flush completed today.  Patient did not wish to make a follow up appointment at this time.  States he will call us to schedule.

## 2018-07-24 ENCOUNTER — Other Ambulatory Visit: Payer: Self-pay | Admitting: Pharmacist

## 2018-07-24 NOTE — Patient Outreach (Signed)
[facility]) Care Management  07/24/2018  Arthur Lutz 12/09/1940 [id number]   Incoming call from Arthur Lutz in response to the [facility] Medication Adherence Campaign. Speak with patient. HIPAA identifiers verified and verbal consent received.  Arthur Lutz reports that he takes his simvastatin 10 mg, as well as his ezetimibe 10 mg, every evening as directed. Patient denies any missed doses or barriers to adherence. Counsel patient on the importance of adherence to these medications.   Patient reports that he does not always take his tamsulosin, as he finds having the need to urinate after taking it to often be inconvenient. Reports that he has an upcoming appointment with his Urologist scheduled and plans to discuss this concern/options with his provider at that time.  Patient denies any further medication questions/concerns at this time.  Will close pharmacy episode.  Harlow Asa, PharmD, [place] [phone]

## 2018-08-04 DIAGNOSIS — H31091 Other chorioretinal scars, right eye: Secondary | ICD-10-CM | POA: Diagnosis not present

## 2018-08-04 DIAGNOSIS — H35422 Microcystoid degeneration of retina, left eye: Secondary | ICD-10-CM | POA: Diagnosis not present

## 2018-08-04 DIAGNOSIS — H353213 Exudative age-related macular degeneration, right eye, with inactive scar: Secondary | ICD-10-CM | POA: Diagnosis not present

## 2018-08-04 DIAGNOSIS — H353221 Exudative age-related macular degeneration, left eye, with active choroidal neovascularization: Secondary | ICD-10-CM | POA: Diagnosis not present

## 2018-08-10 DIAGNOSIS — N401 Enlarged prostate with lower urinary tract symptoms: Secondary | ICD-10-CM | POA: Diagnosis not present

## 2018-08-10 DIAGNOSIS — C672 Malignant neoplasm of lateral wall of bladder: Secondary | ICD-10-CM | POA: Diagnosis not present

## 2018-08-10 DIAGNOSIS — Z08 Encounter for follow-up examination after completed treatment for malignant neoplasm: Secondary | ICD-10-CM | POA: Diagnosis not present

## 2018-08-10 DIAGNOSIS — R972 Elevated prostate specific antigen [PSA]: Secondary | ICD-10-CM | POA: Diagnosis not present

## 2018-08-10 DIAGNOSIS — Z8551 Personal history of malignant neoplasm of bladder: Secondary | ICD-10-CM | POA: Diagnosis not present

## 2018-08-18 DIAGNOSIS — E1151 Type 2 diabetes mellitus with diabetic peripheral angiopathy without gangrene: Secondary | ICD-10-CM | POA: Diagnosis not present

## 2018-08-18 DIAGNOSIS — E1140 Type 2 diabetes mellitus with diabetic neuropathy, unspecified: Secondary | ICD-10-CM | POA: Diagnosis not present

## 2018-09-08 DIAGNOSIS — L570 Actinic keratosis: Secondary | ICD-10-CM | POA: Diagnosis not present

## 2018-09-08 DIAGNOSIS — D225 Melanocytic nevi of trunk: Secondary | ICD-10-CM | POA: Diagnosis not present

## 2018-09-08 DIAGNOSIS — Z85820 Personal history of malignant melanoma of skin: Secondary | ICD-10-CM | POA: Diagnosis not present

## 2018-09-08 DIAGNOSIS — Z85828 Personal history of other malignant neoplasm of skin: Secondary | ICD-10-CM | POA: Diagnosis not present

## 2018-09-22 DIAGNOSIS — H353221 Exudative age-related macular degeneration, left eye, with active choroidal neovascularization: Secondary | ICD-10-CM | POA: Diagnosis not present

## 2018-09-22 DIAGNOSIS — H353213 Exudative age-related macular degeneration, right eye, with inactive scar: Secondary | ICD-10-CM | POA: Diagnosis not present

## 2018-09-22 DIAGNOSIS — H35422 Microcystoid degeneration of retina, left eye: Secondary | ICD-10-CM | POA: Diagnosis not present

## 2018-09-22 DIAGNOSIS — H43813 Vitreous degeneration, bilateral: Secondary | ICD-10-CM | POA: Diagnosis not present

## 2018-09-26 ENCOUNTER — Other Ambulatory Visit: Payer: Self-pay | Admitting: Cardiology

## 2018-10-07 ENCOUNTER — Other Ambulatory Visit: Payer: Self-pay | Admitting: Cardiology

## 2018-10-10 ENCOUNTER — Other Ambulatory Visit: Payer: Self-pay | Admitting: Cardiology

## 2018-10-17 ENCOUNTER — Other Ambulatory Visit: Payer: Self-pay | Admitting: Cardiology

## 2018-10-20 DIAGNOSIS — J302 Other seasonal allergic rhinitis: Secondary | ICD-10-CM | POA: Diagnosis not present

## 2018-10-20 DIAGNOSIS — Z1389 Encounter for screening for other disorder: Secondary | ICD-10-CM | POA: Diagnosis not present

## 2018-10-20 DIAGNOSIS — Z6837 Body mass index (BMI) 37.0-37.9, adult: Secondary | ICD-10-CM | POA: Diagnosis not present

## 2018-10-20 DIAGNOSIS — G4700 Insomnia, unspecified: Secondary | ICD-10-CM | POA: Diagnosis not present

## 2018-10-29 DIAGNOSIS — Z96653 Presence of artificial knee joint, bilateral: Secondary | ICD-10-CM | POA: Diagnosis not present

## 2018-10-29 DIAGNOSIS — Z471 Aftercare following joint replacement surgery: Secondary | ICD-10-CM | POA: Diagnosis not present

## 2018-11-03 DIAGNOSIS — E1151 Type 2 diabetes mellitus with diabetic peripheral angiopathy without gangrene: Secondary | ICD-10-CM | POA: Diagnosis not present

## 2018-11-03 DIAGNOSIS — E1140 Type 2 diabetes mellitus with diabetic neuropathy, unspecified: Secondary | ICD-10-CM | POA: Diagnosis not present

## 2018-11-10 DIAGNOSIS — H353221 Exudative age-related macular degeneration, left eye, with active choroidal neovascularization: Secondary | ICD-10-CM | POA: Diagnosis not present

## 2018-11-19 ENCOUNTER — Other Ambulatory Visit: Payer: Self-pay

## 2018-11-19 MED ORDER — LISINOPRIL 40 MG PO TABS: 20.0000 mg | ORAL_TABLET | Freq: Every day | ORAL | 3 refills | Status: DC

## 2018-11-20 DIAGNOSIS — D485 Neoplasm of uncertain behavior of skin: Secondary | ICD-10-CM | POA: Diagnosis not present

## 2018-11-20 DIAGNOSIS — L821 Other seborrheic keratosis: Secondary | ICD-10-CM | POA: Diagnosis not present

## 2018-11-20 DIAGNOSIS — L738 Other specified follicular disorders: Secondary | ICD-10-CM | POA: Diagnosis not present

## 2018-11-20 DIAGNOSIS — L820 Inflamed seborrheic keratosis: Secondary | ICD-10-CM | POA: Diagnosis not present

## 2018-12-08 ENCOUNTER — Other Ambulatory Visit: Payer: Self-pay | Admitting: Cardiology

## 2018-12-08 ENCOUNTER — Other Ambulatory Visit: Payer: Self-pay

## 2018-12-08 MED ORDER — HYDROCHLOROTHIAZIDE 25 MG PO TABS: 25.0000 mg | ORAL_TABLET | Freq: Every day | ORAL | 0 refills | Status: DC

## 2018-12-09 ENCOUNTER — Other Ambulatory Visit: Payer: Self-pay

## 2018-12-22 DIAGNOSIS — H5213 Myopia, bilateral: Secondary | ICD-10-CM | POA: Diagnosis not present

## 2018-12-25 ENCOUNTER — Ambulatory Visit (INDEPENDENT_AMBULATORY_CARE_PROVIDER_SITE_OTHER): Payer: Medicare Other | Admitting: Allergy & Immunology

## 2018-12-25 ENCOUNTER — Encounter: Payer: Self-pay | Admitting: Allergy & Immunology

## 2018-12-25 ENCOUNTER — Other Ambulatory Visit: Payer: Self-pay

## 2018-12-25 VITALS — BP 120/60 | HR 50 | Temp 98.0°F | Resp 16 | Ht 71.0 in | Wt 258.6 lb

## 2018-12-25 DIAGNOSIS — J3089 Other allergic rhinitis: Secondary | ICD-10-CM

## 2018-12-25 DIAGNOSIS — J310 Chronic rhinitis: Secondary | ICD-10-CM | POA: Diagnosis not present

## 2018-12-25 DIAGNOSIS — L409 Psoriasis, unspecified: Secondary | ICD-10-CM

## 2018-12-25 MED ORDER — EPINEPHRINE 0.3 MG/0.3ML IJ SOAJ: 0.3000 mg | Freq: Once | INTRAMUSCULAR | 2 refills | Status: AC

## 2018-12-25 MED ORDER — FLUTICASONE PROPIONATE 93 MCG/ACT NA EXHU: 2.0000 "application " | INHALANT_SUSPENSION | Freq: Two times a day (BID) | NASAL | 5 refills | Status: DC

## 2018-12-25 NOTE — Progress Notes (Addendum)
NEW PATIENT  Date of Service/Encounter:  12/25/18  Referring provider: Redmond School, MD   Assessment:   Chronic rhinitis  Psoriasis  Plan/Recommendations:   1. Chronic rhinitis  - We will get the testing from [facility]. - Testing was negative to cattle today.  - Information on allergy shots provided. - Consent signed. - Make an appointment in two weeks for the first injection. - Continue with: Coricidin as needed - Start taking: Xhance (fluticasone) 1-2 sprays per nostril daily (sample provided - let us know if this works any better than your previous nose spray). - I am unsure of the coverage of the [facility] with your insurance and Medicare, but we can try to send it in IF you like it.  2. Return in about 3 months (around 03/27/2019). This can be an in-person, a virtual Webex or a telephone follow up visit.   Subjective:   Arthur Lutz is a 78 y.o. male presenting today for evaluation of  Chief Complaint  Patient presents with  . Allergic Rhinitis     Arthur Lutz has a history of the following: Patient Active Problem List   Diagnosis Date Noted  . Sinus bradycardia 08/29/2017  . Chronic stable angina ([place]) - negative Myoview & no new Dz on Cath 05/23/2015  . Insomnia due to stress 07/04/2014    Class: Chronic  . Venous stasis of both lower extremities 11/24/2013  . Abnormal biliary HIDA scan 09/15/2013  . Intestinal bacterial overgrowth 08/16/2013  . Bloating 08/16/2013  . Obesity (BMI 30.0-34.9) 01/30/2013  . Stiffness of joint, not elsewhere classified, lower leg 10/26/2012  . Weakness of left leg 10/26/2012  . Difficulty in walking 10/26/2012  . Postoperative anemia due to acute blood loss 09/29/2012  . OA (osteoarthritis) of knee 09/28/2012  . Portacath in place 08/11/2012  . CAD S/P percutaneous coronary angioplasty: 08/07/11 cutting balloon PTCA to 95% ostial diag. and 90%  proximal LCX.  prior cutting balloon to RCA  & ostium of  ostial diag., 2009  08/07/2011  . Essential hypertension 08/07/2011  . DOE (dyspnea on exertion) 08/07/2011  . Diabetes mellitus type 2, controlled, with complications ([place]) 58/03/9832  . Hyperlipemia, intolerant to statins. Goal LDL < 70 08/07/2011  . Arthritis of knee, left, needs total Knee in future with Dr. Wynelle Link  08/07/2011  . Statin intolerance 08/07/2011    History obtained from: chart review and patient.  Arthur Lutz was referred by Redmond School, MD.     Arthur Lutz is a 78 y.o. male presenting for an evaluation of chronic sinusitis. He was previously evaluated for environmental allergens. He reports that he has nasal congestion. He does use a nasal saline rinse. He thinks that it came up with four different items. He does not remember the exact results. It was recommended that he start allergy shots. We will get the records for this.    The majority of his symptoms consistent of sinus pressure and pain. He tells me that the symptoms get worse as the day goes on. He has no problems with sleeping in the bed. He does not stay much at home. 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 Neither locations seems to make his symptoms any worse. The only time that he feels better is at night when he is sleeping. He is at his best in the morning before he goes to work. He was given a nose spray that helps. He does not remember  the name of it. This was prescribed by the [facility]. He does use Coricidin as needed.   Added 7.30.20: We called the patient to confirm nasal sprays that he has tried. He has tried and failed fluticasone, ipratropium, and mometasone. None of these provided adequate relief.   He does have a history of psoriasis, treated with clobetasol. He sees Dr. Delman Cheadle in [place]. He does has psoriatic arthritis treated with Aleve as needed. He does not take methotrexate. He will take prednisone for a few days if needed.  Otherwise, there is no  history of other atopic diseases, including food allergies, drug allergies, stinging insect allergies, eczema, urticaria or contact dermatitis. There is no significant infectious history. Vaccinations are up to date.    Past Medical History: Patient Active Problem List   Diagnosis Date Noted  . Sinus bradycardia 08/29/2017  . Chronic stable angina ([place]) - negative Myoview & no new Dz on Cath 05/23/2015  . Insomnia due to stress 07/04/2014    Class: Chronic  . Venous stasis of both lower extremities 11/24/2013  . Abnormal biliary HIDA scan 09/15/2013  . Intestinal bacterial overgrowth 08/16/2013  . Bloating 08/16/2013  . Obesity (BMI 30.0-34.9) 01/30/2013  . Stiffness of joint, not elsewhere classified, lower leg 10/26/2012  . Weakness of left leg 10/26/2012  . Difficulty in walking 10/26/2012  . Postoperative anemia due to acute blood loss 09/29/2012  . OA (osteoarthritis) of knee 09/28/2012  . Portacath in place 08/11/2012  . CAD S/P percutaneous coronary angioplasty: 08/07/11 cutting balloon PTCA to 95% ostial diag. and 90%  proximal LCX.  prior cutting balloon to RCA  & ostium of ostial diag., 2009  08/07/2011  . Essential hypertension 08/07/2011  . DOE (dyspnea on exertion) 08/07/2011  . Diabetes mellitus type 2, controlled, with complications ([place]) 71/24/5809  . Hyperlipemia, intolerant to statins. Goal LDL < 70 08/07/2011  . Arthritis of knee, left, needs total Knee in future with Dr. Wynelle Link  08/07/2011  . Statin intolerance 08/07/2011    Medication List:  Allergies as of 12/25/2018      Reactions   Rosuvastatin Calcium Other (See Comments)   Myalgias, tired crestor -aches      Medication List       Accurate as of Dec 25, 2018  1:32 PM. If you have any questions, ask your nurse or doctor.        STOP taking these medications   azithromycin 250 MG tablet Commonly known as:  ZITHROMAX Stopped by:  Valentina Shaggy, MD   clobetasol cream 0.05 % Commonly known  as:  TEMOVATE Stopped by:  Valentina Shaggy, MD   Melatonin 10 MG Caps Stopped by:  Valentina Shaggy, MD   predniSONE 5 MG (48) Tbpk tablet Commonly known as:  STERAPRED UNI-PAK 48 TAB Stopped by:  Valentina Shaggy, MD   tamsulosin 0.4 MG Caps capsule Commonly known as:  FLOMAX Stopped by:  Valentina Shaggy, MD     TAKE these medications   amLODipine 10 MG tablet Commonly known as:  NORVASC TAKE 1 TABLET(10 MG) BY MOUTH DAILY   Co Q-10 100 MG Caps Take 300 capsules by mouth 3 (three) times daily.   EPINEPHrine 0.3 mg/0.3 mL Soaj injection Commonly known as:  EpiPen 2-Pak Inject 0.3 mLs (0.3 mg total) into the muscle once for 1 dose. Started by:  Valentina Shaggy, MD   ezetimibe 10 MG tablet Commonly known as:  ZETIA TAKE 1 TABLET(10 MG)  BY MOUTH DAILY   Fish Oil 1000 MG Caps Take 3 capsules (3,000 mg total) by mouth daily.   fluticasone 50 MCG/ACT nasal spray Commonly known as:  FLONASE fluticasone propionate 50 mcg/actuation nasal spray,suspension  SHAKE LQ AND U 1 TO 2 SPRAYS IEN QD What changed:  Another medication with the same name was added. Make sure you understand how and when to take each. Changed by:  Valentina Shaggy, MD   Fluticasone Propionate 93 MCG/ACT Exhu Commonly known as:  Xhance Place 2 application into the nose 2 (two) times a day. What changed:  You were already taking a medication with the same name, and this prescription was added. Make sure you understand how and when to take each. Changed by:  Valentina Shaggy, MD   hydrochlorothiazide 25 MG tablet Commonly known as:  HYDRODIURIL TAKE 1 TABLET BY MOUTH DAILY   HYDROcodone-acetaminophen 5-325 MG tablet Commonly known as:  NORCO/VICODIN Take 1 tablet by mouth every 6 (six) hours as needed for severe pain.   ibuprofen 600 MG tablet Commonly known as:  ADVIL Take 1 tablet (600 mg total) by mouth every 6 (six) hours as needed.   ipratropium 0.06 % nasal spray  Commonly known as:  ATROVENT ipratropium bromide 42 mcg (0.06 %) nasal spray  USE 2 SPRAYS IEN TID PRN   isosorbide mononitrate 60 MG 24 hr tablet Commonly known as:  IMDUR TAKE 1 TABLET DAILY.   lisinopril 40 MG tablet Commonly known as:  ZESTRIL Take 0.5 tablets (20 mg total) by mouth daily.   meloxicam 7.5 MG tablet Commonly known as:  MOBIC Take 7.5 mg by mouth daily.   metFORMIN 500 MG tablet Commonly known as:  GLUCOPHAGE Take 500 mg by mouth daily with breakfast.   montelukast 10 MG tablet Commonly known as:  SINGULAIR montelukast 10 mg tablet   nystatin-triamcinolone cream Commonly known as:  MYCOLOG II Apply 1 application topically 2 (two) times daily. APPLY TO AFFECTED AREA   PRESERVISION AREDS PO Take 1 capsule by mouth daily.   EYE-VITES PO Take by mouth. Tozol   simvastatin 10 MG tablet Commonly known as:  ZOCOR TAKE 1 TABLET BY MOUTH DAILY AT 6PM   triazolam 0.125 MG tablet Commonly known as:  HALCION Take 1.5 tablets (0.1875 mg total) by mouth at bedtime. Pt takes 1.5 tabs at bedtime   vitamin C 1000 MG tablet Take 1,000 mg by mouth daily.   Vitamin D-1000 Max St 25 MCG (1000 UT) tablet Generic drug:  Cholecalciferol Take by mouth.       Birth History: non-contributory  Developmental History: non-contributory  Past Surgical History: Past Surgical History:  Procedure Laterality Date  . CARDIAC CATHETERIZATION  August '13   Widely patent RCA and LCx stents. Also patent PTCA site to D1, ~40-50% D2. Normal EF, normal EDP  . CATARACT EXTRACTION W/ INTRAOCULAR LENS IMPLANT  ~ 2010   right  . CATARACT EXTRACTION W/PHACO Left 11/21/2016   Procedure: CATARACT EXTRACTION PHACO AND INTRAOCULAR LENS PLACEMENT (IOC) CDE - 11.70 ;  Surgeon: Tonny Branch, MD;  Location: [facility]S;  Service: Ophthalmology;  Laterality: Left;  left  . cold cup removal bladder lesion  11/2005   "malignant"  . COLONOSCOPY N/A 12/09/2014   Procedure: COLONOSCOPY;  Surgeon:  Rogene [place], MD;  Location: [facility] ENDO SUITE;  Service: Endoscopy;  Laterality: N/A;  955 - moved to 8:30 - Ann to notify pt  . CORONARY ANGIOPLASTY WITH STENT PLACEMENT  11/2007  Mid RCA - Driver BMS 3.5 mm x 15 mm; 2.0 mm Cutting Balloon PTCA - ostial D1  . CORONARY ANGIOPLASTY WITH STENT PLACEMENT  08/07/11   Abnormal TM Myoview - for exertional throat discomfort: Patent RCA stent, 90% circumflex -- Vision BMS 3.5 mm x 12 mm --> 4.2 mm. Ostial D2 95% -- 2.25 mm Cutting Balloon PTCA  . DOPPLER ECHOCARDIOGRAPHY  May 2009   Normal EF, impaired relaxation, with sclerotic aortic valve. No stenosis.  . INGUINAL HERNIA REPAIR  ~ 1970   left  . KNEE ARTHROSCOPY  02/20/11   left  . LEFT HEART CATHETERIZATION WITH CORONARY ANGIOGRAM N/A 08/07/2011   Procedure: LEFT HEART CATHETERIZATION WITH CORONARY ANGIOGRAM;  Surgeon: Leonie Man, MD;  Location: [facility] CATH LAB;  Service: Cardiovascular;  Laterality: N/A;  . LEFT HEART CATHETERIZATION WITH CORONARY ANGIOGRAM N/A 03/06/2012   Procedure: LEFT HEART CATHETERIZATION WITH CORONARY ANGIOGRAM;  Surgeon: Leonie Man, MD;  Location: [facility] CATH LAB;  Service: Cardiovascular;  Laterality: N/A;  . NM MYOVIEW LTD  Jan. 31, 2014   Low risk, EF 49%, improved from prior. Small apical and apical septal defect, fixed likely artifact no skin or infarction.  Marland Kitchen PARTIAL KNEE ARTHROPLASTY  06/2003   right  . PERCUTANEOUS CORONARY STENT INTERVENTION (PCI-S)  08/07/2011   Procedure: PERCUTANEOUS CORONARY STENT INTERVENTION (PCI-S);  Surgeon: Leonie Man, MD;  Location: [facility] CATH LAB;  Service: Cardiovascular;;  . TONSILLECTOMY     "as a child"  . TOTAL KNEE ARTHROPLASTY  11/2003   right x2 and one on left- Total of 3.  . TOTAL KNEE ARTHROPLASTY Left 09/28/2012   Procedure: TOTAL KNEE ARTHROPLASTY;  Surgeon: Gearlean Alf, MD;  Location: [facility] ORS;  Service: Orthopedics;  Laterality: Left;     Family History: Family History  Problem Relation Age of Onset  . Emphysema  Father   . Lung cancer Paternal Uncle        x 2 smokers  . Colon cancer Neg Hx      Social History: Murl lives at home with his family. He lives in a house that is 76 years old. There is hardwood in the main living areas and carpeting in the bedroom. They have a combination of gas an electric heat as well as central cooling. There are cattle outside, but otherwise there are no animals. There are no dust mite coverings on the bedding. There is no tobacco exposure (smoked until 30 years ago or so).    Review of Systems  Constitutional: Negative.  Negative for fever, malaise/fatigue and weight loss.  HENT: Positive for congestion, sinus pain and sore throat. Negative for ear discharge and ear pain.   Eyes: Negative for pain, discharge and redness.  Respiratory: Negative for cough, sputum production, shortness of breath and wheezing.   Cardiovascular: Negative.  Negative for chest pain and palpitations.  Gastrointestinal: Negative for abdominal pain and heartburn.  Skin: Negative.  Negative for itching and rash.  Neurological: Negative for dizziness and headaches.  Endo/Heme/Allergies: Negative for environmental allergies. Does not bruise/bleed easily.       Objective:   Blood pressure 120/60, pulse (!) 50, temperature 98 F (36.7 C), temperature source Oral, resp. rate 16, height 5\' 11"  (1.803 m), weight 258 lb 9.6 oz (117.3 kg), SpO2 93 %. Body mass index is 36.07 kg/m.   Physical Exam:   Physical Exam  Constitutional: He appears well-developed.  Pleasant boisterous male.   HENT:  Head: Normocephalic and atraumatic.  Right  Ear: Tympanic membrane, external ear and ear canal normal. No drainage, swelling or tenderness. Tympanic membrane is not injected, not scarred, not erythematous, not retracted and not bulging.  Left Ear: Tympanic membrane, external ear and ear canal normal. No drainage, swelling or tenderness. Tympanic membrane is not injected, not scarred, not  erythematous, not retracted and not bulging.  Nose: Mucosal edema and rhinorrhea present. No nasal deformity or septal deviation. No epistaxis. Right sinus exhibits no maxillary sinus tenderness and no frontal sinus tenderness. Left sinus exhibits no maxillary sinus tenderness and no frontal sinus tenderness.  Mouth/Throat: Uvula is midline and oropharynx is clear and moist. Mucous membranes are not pale and not dry.  Eyes: Pupils are equal, round, and reactive to light. Conjunctivae and EOM are normal. Right eye exhibits no chemosis and no discharge. Left eye exhibits no chemosis and no discharge. Right conjunctiva is not injected. Left conjunctiva is not injected.  Cardiovascular: Normal rate, regular rhythm and normal heart sounds.  Respiratory: Effort normal and breath sounds normal. No accessory muscle usage. No tachypnea. No respiratory distress. He has no wheezes. He has no rhonchi. He has no rales. He exhibits no tenderness.  Moving air well in all lung fields.  GI: There is no abdominal tenderness. There is no rebound and no guarding.  Lymphadenopathy:       Head (right side): No submandibular, no tonsillar and no occipital adenopathy present.       Head (left side): No submandibular, no tonsillar and no occipital adenopathy present.    He has no cervical adenopathy.  Neurological: He is alert.  Skin: No abrasion, no petechiae and no rash noted. Rash is not papular, not vesicular and not urticarial. No erythema. No pallor.  Psychiatric: He has a normal mood and affect.     Diagnostic studies:     Allergy Studies:    Food Adult Perc - 12/25/18 1000    Time Antigen Placed  1638    Allergen Manufacturer  Lavella Hammock    Location  [facility]    Number of allergen test  3     Control-buffer 50% Glycerol  Negative    Control-Histamine 1 mg/ml  2+    6. Other  Negative   cattle      Allergy testing results were read and interpreted by myself, documented by clinical staff.         Salvatore Marvel, MD Allergy and [facility] [place]

## 2018-12-25 NOTE — Patient Instructions (Addendum)
1. Chronic rhinitis - We will get the testing from [facility]. - Information on allergy shots provided. - Consent signed. - Make an appointment in two weeks for the first injection. - Continue with: Coricidin as needed - Start taking: Xhance (fluticasone) 1-2 sprays per nostril daily (sample provided - let us know if this works any better than your previous nose spray). - I am unsure of the coverage of the [facility] with your insurance and Medicare, but we can try to send it in IF you like it.  2. Return in about 3 months (around 03/27/2019). This can be an in-person, a virtual Webex or a telephone follow up visit.   Please inform us of any Emergency Department visits, hospitalizations, or changes in symptoms. Call us before going to the ED for breathing or allergy symptoms since we might be able to fit you in for a sick visit. Feel free to contact us anytime with any questions, problems, or concerns.  It was a pleasure to meet you today!  Websites that have reliable patient information: 1. American Academy of Asthma, Allergy, and Immunology: www.aaaai.org 2. Food Allergy Research and Education (FARE): foodallergy.org 3. Mothers of Asthmatics: http://www.asthmacommunitynetwork.org 4. American College of Allergy, Asthma, and Immunology: www.acaai.org  "Like" [place] on Facebook and Instagram for our latest updates!      Make sure you are registered to vote! If you have moved or changed any of your contact information, you will need to get this updated before voting!    Voter ID laws are NOT going into effect for the General Election in November 2020! DO NOT let this stop you from exercising your right to vote!     Allergy Shots   Allergies are the result of a chain reaction that starts in the immune system. Your immune system controls how your body defends itself. For instance, if you have an allergy to pollen, your immune system identifies pollen as an invader or allergen.  Your immune system overreacts by producing antibodies called Immunoglobulin E (IgE). These antibodies travel to cells that release chemicals, causing an allergic reaction.  The concept behind allergy immunotherapy, whether it is received in the form of shots or tablets, is that the immune system can be desensitized to specific allergens that trigger allergy symptoms. Although it requires time and patience, the payback can be long-term relief.  How Do Allergy Shots Work?  Allergy shots work much like a vaccine. Your body responds to injected amounts of a particular allergen given in increasing doses, eventually developing a resistance and tolerance to it. Allergy shots can lead to decreased, minimal or no allergy symptoms.  There generally are two phases: build-up and maintenance. Build-up often ranges from three to six months and involves receiving injections with increasing amounts of the allergens. The shots are typically given once or twice a week, though more rapid build-up schedules are sometimes used.  The maintenance phase begins when the most effective dose is reached. This dose is different for each person, depending on how allergic you are and your response to the build-up injections. Once the maintenance dose is reached, there are longer periods between injections, typically two to four weeks.  Occasionally doctors give cortisone-type shots that can temporarily reduce allergy symptoms. These types of shots are different and should not be confused with allergy immunotherapy shots.  Who Can Be Treated with Allergy Shots?  Allergy shots may be a good treatment approach for people with allergic rhinitis (hay fever), allergic asthma,  conjunctivitis (eye allergy) or stinging insect allergy.   Before deciding to begin allergy shots, you should consider:  . The length of allergy season and the severity of your symptoms . Whether medications and/or changes to your environment can control your  symptoms . Your desire to avoid long-term medication use . Time: allergy immunotherapy requires a major time commitment . Cost: may vary depending on your insurance coverage  Allergy shots for children age 29 and older are effective and often well tolerated. They might prevent the onset of new allergen sensitivities or the progression to asthma.  Allergy shots are not started on patients who are pregnant but can be continued on patients who become pregnant while receiving them. In some patients with other medical conditions or who take certain common medications, allergy shots may be of risk. It is important to mention other medications you talk to your allergist.   When Will I Feel Better?  Some may experience decreased allergy symptoms during the build-up phase. For others, it may take as long as 12 months on the maintenance dose. If there is no improvement after a year of maintenance, your allergist will discuss other treatment options with you.  If you aren't responding to allergy shots, it may be because there is not enough dose of the allergen in your vaccine or there are missing allergens that were not identified during your allergy testing. Other reasons could be that there are high levels of the allergen in your environment or major exposure to non-allergic triggers like tobacco smoke.  What Is the Length of Treatment?  Once the maintenance dose is reached, allergy shots are generally continued for three to five years. The decision to stop should be discussed with your allergist at that time. Some people may experience a permanent reduction of allergy symptoms. Others may relapse and a longer course of allergy shots can be considered.  What Are the Possible Reactions?  The two types of adverse reactions that can occur with allergy shots are local and systemic. Common local reactions include very mild redness and swelling at the injection site, which can happen immediately or several  hours after. A systemic reaction, which is less common, affects the entire body or a particular body system. They are usually mild and typically respond quickly to medications. Signs include increased allergy symptoms such as sneezing, a stuffy nose or hives.  Rarely, a serious systemic reaction called anaphylaxis can develop. Symptoms include swelling in the throat, wheezing, a feeling of tightness in the chest, nausea or dizziness. Most serious systemic reactions develop within 30 minutes of allergy shots. This is why it is strongly recommended you wait in your doctor's office for 30 minutes after your injections. Your allergist is trained to watch for reactions, and his or her staff is trained and equipped with the proper medications to identify and treat them.  Who Should Administer Allergy Shots?  The preferred location for receiving shots is your prescribing allergist's office. Injections can sometimes be given at another facility where the physician and staff are trained to recognize and treat reactions, and have received instructions by your prescribing allergist.

## 2018-12-29 DIAGNOSIS — H353221 Exudative age-related macular degeneration, left eye, with active choroidal neovascularization: Secondary | ICD-10-CM | POA: Diagnosis not present

## 2019-01-06 ENCOUNTER — Telehealth: Payer: Self-pay

## 2019-01-06 DIAGNOSIS — J302 Other seasonal allergic rhinitis: Secondary | ICD-10-CM | POA: Insufficient documentation

## 2019-01-06 DIAGNOSIS — J3089 Other allergic rhinitis: Secondary | ICD-10-CM | POA: Insufficient documentation

## 2019-01-06 NOTE — Progress Notes (Signed)
VIALS EXP 01-06-2020

## 2019-01-06 NOTE — Addendum Note (Signed)
Addended by: Valentina Shaggy on: 01/06/2019 08:50 AM   Modules accepted: Orders

## 2019-01-06 NOTE — Progress Notes (Signed)
I received outside records from [facility]. Testing was positive only to dust mites, Rhizopus, and cockroach. Results will be scanned into the system. Script written. I will ask our Front Desk to call him and make an appointment to start his injections.  Salvatore Marvel, MD Allergy and [facility] [place]

## 2019-01-06 NOTE — Telephone Encounter (Signed)
I left a voicemail for the patient to call and schedule.

## 2019-01-06 NOTE — Telephone Encounter (Signed)
-----   Message from Valentina Shaggy, MD sent at 01/06/2019  8:50 AM EDT ----- Can you call to let Mr. Leventhal know that we received his outside records and I ordered his allergy shots? He needs to make an appointment to start injections.

## 2019-01-07 DIAGNOSIS — J3089 Other allergic rhinitis: Secondary | ICD-10-CM

## 2019-01-15 ENCOUNTER — Other Ambulatory Visit: Payer: Self-pay | Admitting: Cardiology

## 2019-01-16 ENCOUNTER — Other Ambulatory Visit: Payer: Self-pay | Admitting: Cardiology

## 2019-01-19 DIAGNOSIS — E1151 Type 2 diabetes mellitus with diabetic peripheral angiopathy without gangrene: Secondary | ICD-10-CM | POA: Diagnosis not present

## 2019-01-19 DIAGNOSIS — E1140 Type 2 diabetes mellitus with diabetic neuropathy, unspecified: Secondary | ICD-10-CM | POA: Diagnosis not present

## 2019-01-20 ENCOUNTER — Ambulatory Visit (INDEPENDENT_AMBULATORY_CARE_PROVIDER_SITE_OTHER): Payer: Medicare Other

## 2019-01-20 ENCOUNTER — Other Ambulatory Visit: Payer: Self-pay

## 2019-01-20 DIAGNOSIS — J309 Allergic rhinitis, unspecified: Secondary | ICD-10-CM | POA: Diagnosis not present

## 2019-01-20 NOTE — Progress Notes (Signed)
Immunotherapy   Patient Details  Name: [name] MRN: [id number] Date of Birth: 27-May-1941  01/20/2019  Ihor Austin started his allergy injection. Patient received 0.05 of his blue vial with DM-Molds-CR. Patient waited 30 minutes in an exam room with no problem. Following schedule: B Frequency: 1-2 times weekly Epi-Pen: Yes Consent signed and patient instructions given.   Herbie Drape 01/20/2019, 8:47 AM

## 2019-02-04 DIAGNOSIS — L600 Ingrowing nail: Secondary | ICD-10-CM | POA: Diagnosis not present

## 2019-02-04 DIAGNOSIS — L03032 Cellulitis of left toe: Secondary | ICD-10-CM | POA: Diagnosis not present

## 2019-02-04 DIAGNOSIS — M79672 Pain in left foot: Secondary | ICD-10-CM | POA: Diagnosis not present

## 2019-02-04 DIAGNOSIS — M79675 Pain in left toe(s): Secondary | ICD-10-CM | POA: Diagnosis not present

## 2019-02-08 ENCOUNTER — Ambulatory Visit (INDEPENDENT_AMBULATORY_CARE_PROVIDER_SITE_OTHER): Payer: Medicare Other

## 2019-02-08 DIAGNOSIS — H5203 Hypermetropia, bilateral: Secondary | ICD-10-CM | POA: Diagnosis not present

## 2019-02-08 DIAGNOSIS — N401 Enlarged prostate with lower urinary tract symptoms: Secondary | ICD-10-CM | POA: Diagnosis not present

## 2019-02-08 DIAGNOSIS — H52223 Regular astigmatism, bilateral: Secondary | ICD-10-CM | POA: Diagnosis not present

## 2019-02-08 DIAGNOSIS — J309 Allergic rhinitis, unspecified: Secondary | ICD-10-CM

## 2019-02-08 DIAGNOSIS — H353230 Exudative age-related macular degeneration, bilateral, stage unspecified: Secondary | ICD-10-CM | POA: Diagnosis not present

## 2019-02-08 DIAGNOSIS — R972 Elevated prostate specific antigen [PSA]: Secondary | ICD-10-CM | POA: Diagnosis not present

## 2019-02-08 DIAGNOSIS — H524 Presbyopia: Secondary | ICD-10-CM | POA: Diagnosis not present

## 2019-02-08 DIAGNOSIS — N481 Balanitis: Secondary | ICD-10-CM | POA: Diagnosis not present

## 2019-02-08 DIAGNOSIS — N138 Other obstructive and reflux uropathy: Secondary | ICD-10-CM | POA: Diagnosis not present

## 2019-02-10 ENCOUNTER — Other Ambulatory Visit: Payer: Self-pay

## 2019-02-10 ENCOUNTER — Encounter (HOSPITAL_COMMUNITY)
Admission: RE | Admit: 2019-02-10 | Discharge: 2019-02-10 | Disposition: A | Discharge status: Home / Self Care | Payer: Medicare Other | Source: Ambulatory Visit | Attending: Family Medicine | Admitting: Family Medicine

## 2019-02-10 DIAGNOSIS — Z452 Encounter for adjustment and management of vascular access device: Secondary | ICD-10-CM | POA: Insufficient documentation

## 2019-02-10 MED ORDER — HEPARIN SOD (PORK) LOCK FLUSH 100 UNIT/ML IV SOLN: 500.0000 [IU] | INTRAVENOUS | Status: DC | PRN

## 2019-02-10 MED ORDER — HEPARIN SOD (PORK) LOCK FLUSH 100 UNIT/ML IV SOLN
INTRAVENOUS | Status: AC
  Filled 2019-02-10: qty 5

## 2019-02-12 DIAGNOSIS — H53413 Scotoma involving central area, bilateral: Secondary | ICD-10-CM | POA: Diagnosis not present

## 2019-02-15 ENCOUNTER — Other Ambulatory Visit: Payer: Self-pay | Admitting: Cardiology

## 2019-02-16 ENCOUNTER — Ambulatory Visit (INDEPENDENT_AMBULATORY_CARE_PROVIDER_SITE_OTHER): Payer: Medicare Other | Admitting: *Deleted

## 2019-02-16 DIAGNOSIS — J309 Allergic rhinitis, unspecified: Secondary | ICD-10-CM

## 2019-02-17 DIAGNOSIS — H31091 Other chorioretinal scars, right eye: Secondary | ICD-10-CM | POA: Diagnosis not present

## 2019-02-17 DIAGNOSIS — H35422 Microcystoid degeneration of retina, left eye: Secondary | ICD-10-CM | POA: Diagnosis not present

## 2019-02-17 DIAGNOSIS — H353213 Exudative age-related macular degeneration, right eye, with inactive scar: Secondary | ICD-10-CM | POA: Diagnosis not present

## 2019-02-17 DIAGNOSIS — H353221 Exudative age-related macular degeneration, left eye, with active choroidal neovascularization: Secondary | ICD-10-CM | POA: Diagnosis not present

## 2019-02-17 NOTE — Telephone Encounter (Signed)
error 

## 2019-02-18 ENCOUNTER — Telehealth: Payer: Self-pay | Admitting: *Deleted

## 2019-02-18 DIAGNOSIS — M79671 Pain in right foot: Secondary | ICD-10-CM | POA: Diagnosis not present

## 2019-02-18 DIAGNOSIS — L600 Ingrowing nail: Secondary | ICD-10-CM | POA: Diagnosis not present

## 2019-02-18 DIAGNOSIS — M79674 Pain in right toe(s): Secondary | ICD-10-CM | POA: Diagnosis not present

## 2019-02-18 NOTE — Telephone Encounter (Signed)
PA initiated for [facility] in covermymeds

## 2019-02-18 NOTE — Telephone Encounter (Signed)
Your information has been submitted to [employer] [place]. [employer] [place] will review the request and notify you of the determination decision directly, typically within 3 business days of your submission and once all necessary information is received.  You will also receive your request decision electronically. To check for an update later, open the request again from your dashboard.  If [employer] [place] has not responded within the specified timeframe or if you have any questions about your PA submission, contact [employer] [place] directly at (MAPD) [phone] or (PDP) [phone]. 

## 2019-02-18 NOTE — Telephone Encounter (Signed)
PA was denied for Arthur Lutz needs to try and fail mometasone furoate nasal spray. Dr Ernst Bowler please advise

## 2019-02-19 MED ORDER — XHANCE 93 MCG/ACT NA EXHU: 2.0000 "application " | INHALANT_SUSPENSION | Freq: Two times a day (BID) | NASAL | 11 refills | Status: DC

## 2019-02-19 NOTE — Telephone Encounter (Signed)
Patient's insurance will not cover the [place]. I have sent his prescription to [facility] to get him enrolled in the [place] program.

## 2019-02-19 NOTE — Telephone Encounter (Signed)
Sounds good. Thanks for taking care of him.  Salvatore Marvel, MD Allergy and [place] [facility] [place]

## 2019-02-22 ENCOUNTER — Ambulatory Visit (INDEPENDENT_AMBULATORY_CARE_PROVIDER_SITE_OTHER): Payer: Medicare Other

## 2019-02-22 DIAGNOSIS — J309 Allergic rhinitis, unspecified: Secondary | ICD-10-CM | POA: Diagnosis not present

## 2019-02-23 NOTE — Telephone Encounter (Signed)
Papers signed and placed in the nurses station.  Salvatore Marvel, MD Allergy and [place] [facility] [place]

## 2019-02-23 NOTE — Telephone Encounter (Signed)
Paperwork has been faxed to [employer] as instructed by [facility] pharmacy.

## 2019-02-24 NOTE — Telephone Encounter (Signed)
We will have to submit an appeal for the [facility]. I have placed the paperwork in your office for you to look over.

## 2019-02-25 NOTE — Telephone Encounter (Signed)
Addended previous note to clarify which nasal sprays he had tried.   Salvatore Marvel, MD Allergy and [place] [facility] [place]

## 2019-02-25 NOTE — Telephone Encounter (Signed)
Spoke with [employer] of [place] and they stated we must show documentation on OV to have xhance approved. An appeal form will be faxed to office and we can send the form and documented OV to their appeal fax # @ [phone] with in the comment section stating "Requesting appeal" and have Insurance ID # with DOB in the comment section.

## 2019-03-01 ENCOUNTER — Ambulatory Visit (INDEPENDENT_AMBULATORY_CARE_PROVIDER_SITE_OTHER): Payer: Medicare Other | Admitting: *Deleted

## 2019-03-01 DIAGNOSIS — J309 Allergic rhinitis, unspecified: Secondary | ICD-10-CM | POA: Diagnosis not present

## 2019-03-04 DIAGNOSIS — M79675 Pain in left toe(s): Secondary | ICD-10-CM | POA: Diagnosis not present

## 2019-03-04 DIAGNOSIS — L600 Ingrowing nail: Secondary | ICD-10-CM | POA: Diagnosis not present

## 2019-03-04 DIAGNOSIS — M79672 Pain in left foot: Secondary | ICD-10-CM | POA: Diagnosis not present

## 2019-03-09 ENCOUNTER — Ambulatory Visit (INDEPENDENT_AMBULATORY_CARE_PROVIDER_SITE_OTHER): Payer: Medicare Other | Admitting: *Deleted

## 2019-03-09 DIAGNOSIS — J309 Allergic rhinitis, unspecified: Secondary | ICD-10-CM

## 2019-03-10 DIAGNOSIS — Z6835 Body mass index (BMI) 35.0-35.9, adult: Secondary | ICD-10-CM | POA: Diagnosis not present

## 2019-03-10 DIAGNOSIS — I10 Essential (primary) hypertension: Secondary | ICD-10-CM | POA: Diagnosis not present

## 2019-03-10 DIAGNOSIS — G4700 Insomnia, unspecified: Secondary | ICD-10-CM | POA: Diagnosis not present

## 2019-03-10 DIAGNOSIS — E119 Type 2 diabetes mellitus without complications: Secondary | ICD-10-CM | POA: Diagnosis not present

## 2019-03-10 DIAGNOSIS — E6609 Other obesity due to excess calories: Secondary | ICD-10-CM | POA: Diagnosis not present

## 2019-03-10 DIAGNOSIS — Z1389 Encounter for screening for other disorder: Secondary | ICD-10-CM | POA: Diagnosis not present

## 2019-03-19 ENCOUNTER — Ambulatory Visit (INDEPENDENT_AMBULATORY_CARE_PROVIDER_SITE_OTHER): Payer: Medicare Other | Admitting: *Deleted

## 2019-03-19 ENCOUNTER — Telehealth: Payer: Self-pay | Admitting: *Deleted

## 2019-03-19 DIAGNOSIS — J309 Allergic rhinitis, unspecified: Secondary | ICD-10-CM | POA: Diagnosis not present

## 2019-03-19 NOTE — Telephone Encounter (Signed)
Patient came in to get his allergy injection and states that he has been having a lot off issues with his "head feeling stuffed up" and having pressure around his eyes. Other than his allergy shot he takes [place] but he states that it does not fully help with his symptoms. He avoids taking antihistamines due to his BP. Please advise. Patient has not been having any issues with his allergy shot.

## 2019-03-22 MED ORDER — AZELASTINE HCL 0.15 % NA SOLN: 1.0000 | Freq: Two times a day (BID) | NASAL | 5 refills | Status: DC

## 2019-03-22 NOTE — Telephone Encounter (Signed)
Why don't we try using a nasal antihistamine like azelastine one spray per nostril twice daily. It's an antihistamine but only acts locally within the nose to help with congestion.   Salvatore Marvel, MD

## 2019-03-22 NOTE — Addendum Note (Signed)
Addended by: Horris Latino on: 03/22/2019 04:51 PM   Modules accepted: Orders

## 2019-03-22 NOTE — Telephone Encounter (Signed)
Patient informed of nasal spray and agreed with plan. Script sent into pharmacy.

## 2019-03-23 ENCOUNTER — Ambulatory Visit
Admission: EM | Admit: 2019-03-23 | Discharge: 2019-03-23 | Disposition: A | Discharge status: Home / Self Care | Payer: Medicare Other | Attending: Emergency Medicine | Admitting: Emergency Medicine

## 2019-03-23 ENCOUNTER — Other Ambulatory Visit: Payer: Self-pay

## 2019-03-23 DIAGNOSIS — R195 Other fecal abnormalities: Secondary | ICD-10-CM

## 2019-03-23 DIAGNOSIS — J029 Acute pharyngitis, unspecified: Secondary | ICD-10-CM | POA: Diagnosis not present

## 2019-03-23 DIAGNOSIS — J3489 Other specified disorders of nose and nasal sinuses: Secondary | ICD-10-CM | POA: Diagnosis not present

## 2019-03-23 MED ORDER — CETIRIZINE HCL 5 MG PO TABS: 5.0000 mg | ORAL_TABLET | Freq: Every day | ORAL | 0 refills | Status: DC

## 2019-03-23 NOTE — ED Triage Notes (Signed)
Pt states he has had diarrhea since Sunday after eating at [employer]. , today is improved. 2 loose stools today. Appetite is good

## 2019-03-23 NOTE — Discharge Instructions (Signed)
Get rest and drink fluids.   Supplement with OTC pedialyte, oral rehydration solution, or glucerna Follow up with PCP for recheck and further evaluation as needed If you experience new or worsening symptoms return or go to ER such as fever, chills, nausea, vomiting, worsening diarrhea, bloody or dark tarry stools, constipation, urinary symptoms, worsening abdominal discomfort, symptoms that do not improve with treatment recommendations, inability to keep fluids down, etc...  Zyrtec prescribed for chronic sinus drainage Will follow up with allergist next for recheckGet rest and drink fluids.   Supplement with OTC pedialyte, oral rehydration solution, or glucerna Follow up with PCP for recheck and further evaluation as needed If you experience new or worsening symptoms return or go to ER such as fever, chills, nausea, vomiting, worsening diarrhea, bloody or dark tarry stools, constipation, urinary symptoms, worsening abdominal discomfort, symptoms that do not improve with treatment recommendations, inability to keep fluids down, etc...  Zyrtec prescribed for chronic sinus drainage Will follow up with allergist next for recheck

## 2019-03-23 NOTE — ED Provider Notes (Signed)
[facility]   [id number] 03/23/19 Arrival Time: 74  CC: Diarrhea/ loose stools, and sinus drainage  SUBJECTIVE:  Arthur Lutz is a 78 y.o. male hx significant for AV sclerosis, H/O bladder CA, DM type 2, dyslipidemia, HTN, MI, and stroke, who presents with complaint of improving diarrhea/ loose stools x 2 days.  Reports 2-3 episodes per day.  Symptoms began after eating at [facility].  Wife also ate [employer] and did not have diarrhea/ loose stools. Denies abdominal pain.  Has tried OTC immodium with relief.  Denies aggravating factors.  Reports similar symptoms in the past.  Last BM 2 hours ago with more formed loose stools.  Complains of associated fatigue.    Denies fever, chills, nausea, vomiting, chest pain, SOB, constipation, hematochezia, melena, dysuria, difficulty urinating, increased frequency or urgency, flank pain, loss of bowel or bladder function.    Patient also mentions scratchy throat and sinus drainage for the past few months.  Is followed by an allergist.  Has use nasal sprays without relief.   ROS: As per HPI.  All other pertinent ROS negative.     Past Medical History:  Diagnosis Date   Allergic rhinitis    Aortic valve sclerosis    Cause of murmur   Arthritis of knee, left, needs total Knee in future with Dr. Wynelle Link  08/07/2011   Bladder cancer ([place]) 11/2005   CAD S/P percutaneous coronary angioplasty 08/07/2011   NEW 08/07/11 cutting balloon PTCA to 95% ostial diag. and 90%  proximal LCX.  prior cutting balloon and to RCA  & ostium of ostial diag; Last Cath 02/2012 - Patent Diag PTCA, RCA & Circ BMS stents. Normal EF & EDP; Myoview 2/14 no evidence of ischemia or infarction   Diabetes mellitus type 2, controlled, with complications ([place])    Diabetes mellitus without complication ([place])    Dyslipidemia    History of heart attack 11/2007   "mild; did not affect the heart muscle"; PCI to RCA, Cutting PTCA Diag ostium   Hypertension    Macular  degeneration    Macular degeneration of right eye    Now getting shots.    Melanoma of skin ([place]) ~ 2005   "level 2; on my back"   Myocardial infarction [facility]) 2012   Osteoarthritis    Status post right knee arthroplasty, positive for staph infection. Pending right arthroplasty   Portacath in place 08/11/2012   Statin intolerance 08/07/2011   Stroke [facility]) 1980's   "had facial strokes; 2; about a year apart; never did find what caused them"   Past Surgical History:  Procedure Laterality Date   CARDIAC CATHETERIZATION  August '13   Widely patent RCA and LCx stents. Also patent PTCA site to D1, ~40-50% D2. Normal EF, normal EDP   CATARACT EXTRACTION W/ INTRAOCULAR LENS IMPLANT  ~ 2010   right   CATARACT EXTRACTION W/PHACO Left 11/21/2016   Procedure: CATARACT EXTRACTION PHACO AND INTRAOCULAR LENS PLACEMENT (IOC) CDE - 11.70 ;  Surgeon: Tonny Branch, MD;  Location: [facility] ORS;  Service: Ophthalmology;  Laterality: Left;  left   cold cup removal bladder lesion  11/2005   "malignant"   COLONOSCOPY N/A 12/09/2014   Procedure: COLONOSCOPY;  Surgeon: Rogene Houston, MD;  Location: [facility] ENDO SUITE;  Service: Endoscopy;  Laterality: N/A;  955 - moved to 8:30 - Ann to notify pt   CORONARY ANGIOPLASTY WITH STENT PLACEMENT  11/2007   Mid RCA - Driver BMS 3.5 mm x 15 mm; 2.0 mm  Cutting Balloon PTCA - ostial D1   CORONARY ANGIOPLASTY WITH STENT PLACEMENT  08/07/11   Abnormal TM Myoview - for exertional throat discomfort: Patent RCA stent, 90% circumflex -- Vision BMS 3.5 mm x 12 mm --> 4.2 mm. Ostial D2 95% -- 2.25 mm Cutting Balloon PTCA   DOPPLER ECHOCARDIOGRAPHY  May 2009   Normal EF, impaired relaxation, with sclerotic aortic valve. No stenosis.   INGUINAL HERNIA REPAIR  ~ 1970   left   KNEE ARTHROSCOPY  02/20/11   left   LEFT HEART CATHETERIZATION WITH CORONARY ANGIOGRAM N/A 08/07/2011   Procedure: LEFT HEART CATHETERIZATION WITH CORONARY ANGIOGRAM;  Surgeon: Leonie Man, MD;  Location: [facility]  CATH LAB;  Service: Cardiovascular;  Laterality: N/A;   LEFT HEART CATHETERIZATION WITH CORONARY ANGIOGRAM N/A 03/06/2012   Procedure: LEFT HEART CATHETERIZATION WITH CORONARY ANGIOGRAM;  Surgeon: Leonie Man, MD;  Location: [facility] CATH LAB;  Service: Cardiovascular;  Laterality: N/A;   NM MYOVIEW LTD  Jan. 31, 2014   Low risk, EF 49%, improved from prior. Small apical and apical septal defect, fixed likely artifact no skin or infarction.   PARTIAL KNEE ARTHROPLASTY  06/2003   right   PERCUTANEOUS CORONARY STENT INTERVENTION (PCI-S)  08/07/2011   Procedure: PERCUTANEOUS CORONARY STENT INTERVENTION (PCI-S);  Surgeon: Leonie Man, MD;  Location: [facility] CATH LAB;  Service: Cardiovascular;;   TONSILLECTOMY     "as a child"   TOTAL KNEE ARTHROPLASTY  11/2003   right x2 and one on left- Total of 3.   TOTAL KNEE ARTHROPLASTY Left 09/28/2012   Procedure: TOTAL KNEE ARTHROPLASTY;  Surgeon: Gearlean Alf, MD;  Location: [facility] ORS;  Service: Orthopedics;  Laterality: Left;   Allergies  Allergen Reactions   Rosuvastatin Calcium Other (See Comments)    Myalgias, tired crestor -aches   No current facility-administered medications on file prior to encounter.    Current Outpatient Medications on File Prior to Encounter  Medication Sig Dispense Refill   amLODipine (NORVASC) 10 MG tablet TAKE 1 TABLET(10 MG) BY MOUTH DAILY 90 tablet 1   Ascorbic Acid (VITAMIN C) 1000 MG tablet Take 1,000 mg by mouth daily.     Azelastine HCl 0.15 % SOLN Place 1-2 sprays into both nostrils 2 (two) times daily. 30 mL 5   Cholecalciferol (VITAMIN D-1000 MAX ST) 25 MCG (1000 UT) tablet Take by mouth.     Coenzyme Q10 (CO Q-10) 100 MG CAPS Take 300 capsules by mouth 3 (three) times daily. 90 each 11   ezetimibe (ZETIA) 10 MG tablet TAKE 1 TABLET(10 MG) BY MOUTH DAILY 90 tablet 3   fluticasone (FLONASE) 50 MCG/ACT nasal spray fluticasone propionate 50 mcg/actuation nasal spray,suspension  SHAKE LQ AND U 1 TO 2 SPRAYS  IEN QD     Fluticasone Propionate (XHANCE) 93 MCG/ACT EXHU Place 2 application into the nose 2 (two) times a day. 32 mL 11   hydrochlorothiazide (HYDRODIURIL) 25 MG tablet TAKE 1 TABLET BY MOUTH DAILY 90 tablet 0   HYDROcodone-acetaminophen (NORCO/VICODIN) 5-325 MG tablet Take 1 tablet by mouth every 6 (six) hours as needed for severe pain. 10 tablet 0   ibuprofen (ADVIL,MOTRIN) 600 MG tablet Take 1 tablet (600 mg total) by mouth every 6 (six) hours as needed. 30 tablet 0   ipratropium (ATROVENT) 0.06 % nasal spray ipratropium bromide 42 mcg (0.06 %) nasal spray  USE 2 SPRAYS IEN TID PRN     isosorbide mononitrate (IMDUR) 60 MG 24 hr tablet TAKE 1 TABLET BY  MOUTH DAILY 90 tablet 1   lisinopril (ZESTRIL) 40 MG tablet Take 0.5 tablets (20 mg total) by mouth daily. 90 tablet 3   meloxicam (MOBIC) 7.5 MG tablet Take 7.5 mg by mouth daily.     metFORMIN (GLUCOPHAGE) 500 MG tablet Take 500 mg by mouth daily with breakfast.      montelukast (SINGULAIR) 10 MG tablet montelukast 10 mg tablet     Multiple Vitamins-Minerals (EYE-VITES PO) Take by mouth. Tozol     Multiple Vitamins-Minerals (PRESERVISION AREDS PO) Take 1 capsule by mouth daily.      nystatin-triamcinolone (MYCOLOG II) cream Apply 1 application topically 2 (two) times daily. APPLY TO AFFECTED AREA     Omega-3 Fatty Acids (FISH OIL) 1000 MG CAPS Take 3 capsules (3,000 mg total) by mouth daily. 90 capsule 11   simvastatin (ZOCOR) 10 MG tablet TAKE 1 TABLET BY MOUTH DAILY AT 6 PM 90 tablet 0   triazolam (HALCION) 0.125 MG tablet Take 1.5 tablets (0.1875 mg total) by mouth at bedtime. 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   Social Needs   Financial resource strain: Not on file   Food insecurity    Worry: Not on file    Inability: Not on file   Transportation needs    Medical: Not on file    Non-medical: Not on file  Tobacco Use   Smoking status: Former Smoker    Packs/day: 2.00    Years: 28.00    Pack years: 56.00    Types: Cigarettes    Quit date: 07/29/1980    Years since quitting: 38.6   Smokeless tobacco: Never Used  Substance and Sexual Activity   Alcohol use: Yes    Alcohol/week: 2.0 standard drinks    Types: 1 Glasses of wine, 1 Standard drinks or equivalent per week    Comment:  "wine or whiskey" occasionally   Drug use: No   Sexual activity: Never  Lifestyle   Physical activity    Days per week: Not on file    Minutes per session: Not on file   Stress: Not on file  Relationships   Social connections    Talks on phone: Not on file    Gets together: Not on file    Attends religious service: Not on file    Active member of club or organization: Not on file    Attends meetings of clubs or organizations: Not on file    Relationship status: Not on file   Intimate partner violence    Fear of current or ex partner: Not on file    Emotionally abused: Not on file    Physically abused: Not on file    Forced sexual activity: Not on file  Other Topics Concern   Not on file  Social History Narrative   He is a married father of 2, grandfather of 42. He is not really getting a lot of exercise now. He previously had been working out on a treadmill at least 2 times a day for 15 minutes at a time, but he   is not able to do that as much now  because his knee hurts him too bad. Does not smoke and only occasional alcoholic beverage.   Family History  Problem Relation Age of Onset   Emphysema Father    Lung cancer Paternal Uncle        x 2 smokers   Colon cancer Neg Hx      OBJECTIVE:  Vitals:   03/23/19 1034  BP: (!) 142/79  Pulse: 62  Resp: 18  Temp: 98 F (36.7 C)    SpO2: 90%    Rechecked pulse ox in room without mask, improved to 96% on RA  General appearance: Alert; NAD HEENT: NCAT.  Oropharynx clear.  Lungs: clear to auscultation bilaterally without adventitious breath sounds Heart: Systolic murmur heard over aortic region.  Abdomen: soft, non-distended; normal active bowel sounds; mildly TTP over epigastric region; nontender at McBurney's point; negative Murphy's sign; no guarding Extremities: no edema; symmetrical with no gross deformities Skin: warm and dry Neurologic: normal gait Psychological: alert and cooperative; normal mood and affect   ASSESSMENT & PLAN:  1. Loose stools   2. Sinus drainage     Meds ordered this encounter  Medications   cetirizine (ZYRTEC) 5 MG tablet    Sig: Take 1 tablet (5 mg total) by mouth daily.    Dispense:  20 tablet    Refill:  0    Order Specific Question:   Supervising Provider    Answer:   Raylene Everts [id number]    Get rest and drink fluids.   Supplement with OTC pedialyte, oral rehydration solution, or glucerna Follow up with PCP for recheck and further evaluation as needed If you experience new or worsening symptoms return or go to ER such as fever, chills, nausea, vomiting, worsening diarrhea, bloody or dark tarry stools, constipation, urinary symptoms, worsening abdominal discomfort, symptoms that do not improve with treatment recommendations, inability to keep fluids down, etc...  Zyrtec prescribed for chronic sinus drainage Will follow up with allergist next for recheck  Reviewed expectations re: course of current medical issues. Questions answered. Outlined signs and symptoms indicating need for more acute intervention. Patient verbalized understanding. After Visit Summary given.   Lestine Box, PA-C 03/23/19 1104

## 2019-03-26 ENCOUNTER — Encounter: Payer: Self-pay | Admitting: Allergy & Immunology

## 2019-03-26 ENCOUNTER — Ambulatory Visit (INDEPENDENT_AMBULATORY_CARE_PROVIDER_SITE_OTHER): Payer: Medicare Other | Admitting: Allergy & Immunology

## 2019-03-26 ENCOUNTER — Other Ambulatory Visit: Payer: Self-pay

## 2019-03-26 VITALS — BP 138/76 | HR 74 | Temp 98.4°F | Resp 16 | Ht 71.0 in | Wt 258.6 lb

## 2019-03-26 DIAGNOSIS — Z6835 Body mass index (BMI) 35.0-35.9, adult: Secondary | ICD-10-CM | POA: Diagnosis not present

## 2019-03-26 DIAGNOSIS — L409 Psoriasis, unspecified: Secondary | ICD-10-CM

## 2019-03-26 DIAGNOSIS — J3089 Other allergic rhinitis: Secondary | ICD-10-CM | POA: Diagnosis not present

## 2019-03-26 DIAGNOSIS — J302 Other seasonal allergic rhinitis: Secondary | ICD-10-CM | POA: Diagnosis not present

## 2019-03-26 DIAGNOSIS — E6609 Other obesity due to excess calories: Secondary | ICD-10-CM | POA: Diagnosis not present

## 2019-03-26 DIAGNOSIS — K5289 Other specified noninfective gastroenteritis and colitis: Secondary | ICD-10-CM | POA: Diagnosis not present

## 2019-03-26 NOTE — Progress Notes (Signed)
FOLLOW UP  Date of Service/Encounter:  03/26/19   Assessment:   Perennial and seasonal allergic rhinitis (dust mites, mold, cockroach)  Psoriasis  Plan/Recommendations:   1. Chronic rhinitis (dust mites, mold, cockroach) - We will work on getting the [place] approved.  - In the meantime, we provided you with more samples.  - Continue with: Coricidin as needed, Xhance (fluticasone) 1-2 sprays per nostril daily, and Astelin (azelastine) 2 sprays per nostril up to twice daily  2. Return in about 1 year (around 03/25/2020). This can be an in-person, a virtual Webex or a telephone follow up visit.  Subjective:   Arthur Lutz is a 78 y.o. male presenting today for follow up of  Chief Complaint  Patient presents with  . Allergic Rhinitis     Arthur Lutz has a history of the following: Patient Active Problem List   Diagnosis Date Noted  . Perennial allergic rhinitis 01/06/2019  . Sinus bradycardia 08/29/2017  . Chronic stable angina ([place]) - negative Myoview & no new Dz on Cath 05/23/2015  . Insomnia due to stress 07/04/2014    Class: Chronic  . Venous stasis of both lower extremities 11/24/2013  . Abnormal biliary HIDA scan 09/15/2013  . Intestinal bacterial overgrowth 08/16/2013  . Bloating 08/16/2013  . Obesity (BMI 30.0-34.9) 01/30/2013  . Stiffness of joint, not elsewhere classified, lower leg 10/26/2012  . Weakness of left leg 10/26/2012  . Difficulty in walking 10/26/2012  . Postoperative anemia due to acute blood loss 09/29/2012  . OA (osteoarthritis) of knee 09/28/2012  . Portacath in place 08/11/2012  . CAD S/P percutaneous coronary angioplasty: 08/07/11 cutting balloon PTCA to 95% ostial diag. and 90%  proximal LCX.  prior cutting balloon to RCA  & ostium of ostial diag., 2009  08/07/2011  . Essential hypertension 08/07/2011  . DOE (dyspnea on exertion) 08/07/2011  . Diabetes mellitus type 2, controlled, with complications ([place]) 123456  . Hyperlipemia,  intolerant to statins. Goal LDL < 70 08/07/2011  . Arthritis of knee, left, needs total Knee in future with Dr. Wynelle Link  08/07/2011  . Statin intolerance 08/07/2011    History obtained from: chart review and patient.  Arthur Lutz is a 78 y.o. male presenting for a follow up visit.  He was last seen as a new patient in May 2020.  At that time, we decided to obtain the skin testing results from his other allergist office.  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  We continued Coricidin as needed and we started Xhance 1 to 2 sprays per nostril daily.  We did give him a sample at that time.  Testing from his previous allergist was positive only to dust mites, Rhizopus, and cockroach.  We did want to start allergy shots and he has since done that.  Since the last visit, he has done well.  He does feel like her nasal spray sample has helped remarkably with his congestion.  However, apparently his out-of-pocket cost was going to be $500 which was clearly unaffordable for anybody.  We did provide him with another sample today.  We added on Astelin last week, which he feels has helped with the congestion some more as well.  He does not feel that the shots are helping him yet, but I did reassure him that he is not even on maintenance yet so he may not feel much relief at this point.  He has not needed antibiotics at all.  Annan is on allergen immunotherapy. He receives one injection. Immunotherapy script #1 contains molds, dust mites and cockroach. He currently receives 0.27mL of the BLUE vial (1/100,000). He started shots June of 2020 and not yet reached maintenance.   His cast continued to do well.  He has them divided amongst 3 different pastures. Otherwise, there have been no changes to his past medical history, surgical history, family history, or social history.    Review of Systems  Constitutional: Negative.  Negative for fever, malaise/fatigue and weight loss.  HENT:  Positive for congestion. Negative for ear discharge, ear pain, sinus pain and sore throat.        Positive for postnasal drip.  Eyes: Negative for pain, discharge and redness.  Respiratory: Negative for cough, sputum production, shortness of breath and wheezing.   Cardiovascular: Negative.  Negative for chest pain and palpitations.  Gastrointestinal: Negative for abdominal pain, constipation, diarrhea, heartburn, nausea and vomiting.  Skin: Negative.  Negative for itching and rash.  Neurological: Negative for dizziness and headaches.  Endo/Heme/Allergies: Negative for environmental allergies. Does not bruise/bleed easily.       Objective:   Blood pressure 138/76, pulse 74, temperature 98.4 F (36.9 C), temperature source Temporal, resp. rate 16, height 5\' 11"  (1.803 m), weight 258 lb 9.6 oz (117.3 kg), SpO2 94 %. Body mass index is 36.07 kg/m.   Physical Exam:  Physical Exam  Constitutional: He appears well-developed.  HENT:  Head: Normocephalic and atraumatic.  Right Ear: Tympanic membrane, external ear and ear canal normal.  Left Ear: Tympanic membrane, external ear and ear canal normal.  Nose: Mucosal edema and rhinorrhea present. No nasal deformity or septal deviation. No epistaxis. Right sinus exhibits no maxillary sinus tenderness and no frontal sinus tenderness. Left sinus exhibits no maxillary sinus tenderness and no frontal sinus tenderness.  Mouth/Throat: Uvula is midline and oropharynx is clear and moist. Mucous membranes are not pale and not dry.  Tonsils 2+ bilaterally without exudates.  Eyes: Pupils are equal, round, and reactive to light. Conjunctivae and EOM are normal. Right eye exhibits no chemosis and no discharge. Left eye exhibits no chemosis and no discharge. Right conjunctiva is not injected. Left conjunctiva is not injected.  Cardiovascular: Normal rate, regular rhythm and normal heart sounds.  Respiratory: Effort normal and breath sounds normal. No accessory  muscle usage. No tachypnea. No respiratory distress. He has no wheezes. He has no rhonchi. He has no rales. He exhibits no tenderness.  Moving air well in all lung fields.  Lymphadenopathy:    He has no cervical adenopathy.  Neurological: He is alert.  Skin: No abrasion, no petechiae and no rash noted. Rash is not papular, not vesicular and not urticarial. No erythema. No pallor.  No rashes or hives noted.  Psychiatric: He has a normal mood and affect.     Diagnostic studies: none      Salvatore Marvel, MD  All[facility] and [place] [facility] [place]

## 2019-03-26 NOTE — Progress Notes (Signed)
Prior auth has been submitted. 

## 2019-03-26 NOTE — Progress Notes (Signed)
I believe that you are going to have to call and initiate an appeal for this one Dr. Ernst Bowler.

## 2019-03-26 NOTE — Patient Instructions (Addendum)
1. Chronic rhinitis (dust mites, mold, cockroach) - We will work on getting the [place] approved.  - In the meantime, we provided you with more samples.  - Continue with: Coricidin as needed, Xhance (fluticasone) 1-2 sprays per nostril daily, and Astelin (azelastine) 2 sprays per nostril up to twice daily  2. Return in about 1 year (around 03/25/2020). This can be an in-person, a virtual Webex or a telephone follow up visit.   Please inform us of any Emergency Department visits, hospitalizations, or changes in symptoms. Call us before going to the ED for breathing or allergy symptoms since we might be able to fit you in for a sick visit. Feel free to contact us anytime with any questions, problems, or concerns.  It was a pleasure to see you again today!  Websites that have reliable patient information: 1. American Academy of Asthma, Allergy, and Immunology: www.aaaai.org 2. Food Allergy Research and Education (FARE): foodallergy.org 3. Mothers of Asthmatics: http://www.asthmacommunitynetwork.org 4. American College of Allergy, Asthma, and Immunology: www.acaai.org  "Like" [place] on Facebook and Instagram for our latest updates!      Make sure you are registered to vote! If you have moved or changed any of your contact information, you will need to get this updated before voting!    Voter ID laws are NOT going into effect for the General Election in November 2020! DO NOT let this stop you from exercising your right to vote!

## 2019-03-29 ENCOUNTER — Emergency Department (HOSPITAL_COMMUNITY): Payer: Medicare Other

## 2019-03-29 ENCOUNTER — Encounter (HOSPITAL_COMMUNITY): Payer: Self-pay | Admitting: Emergency Medicine

## 2019-03-29 ENCOUNTER — Encounter

## 2019-03-29 ENCOUNTER — Observation Stay (HOSPITAL_COMMUNITY)
Admission: EM | Admit: 2019-03-29 | Discharge: 2019-03-29 | Discharge status: Against Medical Advice | Payer: Medicare Other | Attending: General Surgery | Admitting: General Surgery

## 2019-03-29 ENCOUNTER — Other Ambulatory Visit: Payer: Self-pay

## 2019-03-29 DIAGNOSIS — E119 Type 2 diabetes mellitus without complications: Secondary | ICD-10-CM | POA: Diagnosis not present

## 2019-03-29 DIAGNOSIS — M1990 Unspecified osteoarthritis, unspecified site: Secondary | ICD-10-CM | POA: Insufficient documentation

## 2019-03-29 DIAGNOSIS — I700 Atherosclerosis of aorta: Secondary | ICD-10-CM | POA: Insufficient documentation

## 2019-03-29 DIAGNOSIS — Z85820 Personal history of malignant melanoma of skin: Secondary | ICD-10-CM | POA: Insufficient documentation

## 2019-03-29 DIAGNOSIS — Z955 Presence of coronary angioplasty implant and graft: Secondary | ICD-10-CM | POA: Diagnosis not present

## 2019-03-29 DIAGNOSIS — E860 Dehydration: Secondary | ICD-10-CM | POA: Diagnosis not present

## 2019-03-29 DIAGNOSIS — H3530 Unspecified macular degeneration: Secondary | ICD-10-CM | POA: Insufficient documentation

## 2019-03-29 DIAGNOSIS — R109 Unspecified abdominal pain: Principal | ICD-10-CM | POA: Diagnosis present

## 2019-03-29 DIAGNOSIS — Z87891 Personal history of nicotine dependence: Secondary | ICD-10-CM | POA: Insufficient documentation

## 2019-03-29 DIAGNOSIS — I252 Old myocardial infarction: Secondary | ICD-10-CM | POA: Insufficient documentation

## 2019-03-29 DIAGNOSIS — Z79899 Other long term (current) drug therapy: Secondary | ICD-10-CM | POA: Diagnosis not present

## 2019-03-29 DIAGNOSIS — N400 Enlarged prostate without lower urinary tract symptoms: Secondary | ICD-10-CM | POA: Diagnosis not present

## 2019-03-29 DIAGNOSIS — I10 Essential (primary) hypertension: Secondary | ICD-10-CM | POA: Insufficient documentation

## 2019-03-29 DIAGNOSIS — Z8673 Personal history of transient ischemic attack (TIA), and cerebral infarction without residual deficits: Secondary | ICD-10-CM | POA: Diagnosis not present

## 2019-03-29 DIAGNOSIS — I2510 Atherosclerotic heart disease of native coronary artery without angina pectoris: Secondary | ICD-10-CM | POA: Diagnosis not present

## 2019-03-29 DIAGNOSIS — Z7951 Long term (current) use of inhaled steroids: Secondary | ICD-10-CM | POA: Diagnosis not present

## 2019-03-29 DIAGNOSIS — E785 Hyperlipidemia, unspecified: Secondary | ICD-10-CM | POA: Insufficient documentation

## 2019-03-29 DIAGNOSIS — R52 Pain, unspecified: Secondary | ICD-10-CM | POA: Diagnosis not present

## 2019-03-29 DIAGNOSIS — R0902 Hypoxemia: Secondary | ICD-10-CM | POA: Diagnosis not present

## 2019-03-29 DIAGNOSIS — R197 Diarrhea, unspecified: Secondary | ICD-10-CM | POA: Diagnosis not present

## 2019-03-29 DIAGNOSIS — M545 Low back pain: Secondary | ICD-10-CM | POA: Diagnosis not present

## 2019-03-29 DIAGNOSIS — R102 Pelvic and perineal pain: Secondary | ICD-10-CM | POA: Diagnosis not present

## 2019-03-29 DIAGNOSIS — R1032 Left lower quadrant pain: Secondary | ICD-10-CM | POA: Diagnosis not present

## 2019-03-29 LAB — URINALYSIS, ROUTINE W REFLEX MICROSCOPIC
Bacteria, UA: NONE SEEN
Bilirubin Urine: NEGATIVE
Glucose, UA: >=500 mg/dL — AB
Hgb urine dipstick: NEGATIVE
Ketones, ur: NEGATIVE mg/dL
Leukocytes,Ua: NEGATIVE
Nitrite: NEGATIVE
Protein, ur: NEGATIVE mg/dL
Specific Gravity, Urine: 1.014 (ref 1.005–1.030)
pH: 5 (ref 5.0–8.0)

## 2019-03-29 LAB — COMPREHENSIVE METABOLIC PANEL
ALT: 25 U/L (ref 0–44)
AST: 18 U/L (ref 15–41)
Albumin: 3.7 g/dL (ref 3.5–5.0)
Alkaline Phosphatase: 65 U/L (ref 38–126)
Anion gap: 9 (ref 5–15)
BUN: 13 mg/dL (ref 8–23)
CO2: 30 mmol/L (ref 22–32)
Calcium: 9 mg/dL (ref 8.9–10.3)
Chloride: 101 mmol/L (ref 98–111)
Creatinine, Ser: 0.86 mg/dL (ref 0.61–1.24)
GFR calc Af Amer: >60 mL/min (ref >60)
GFR calc non Af Amer: >60 mL/min (ref >60)
Glucose, Bld: 188 mg/dL — ABNORMAL HIGH (ref 70–99)
Potassium: 3.6 mmol/L (ref 3.5–5.1)
Sodium: 140 mmol/L (ref 135–145)
Total Bilirubin: 0.5 mg/dL (ref 0.3–1.2)
Total Protein: 6.7 g/dL (ref 6.5–8.1)

## 2019-03-29 LAB — CBC
HCT: 47.9 % (ref 39.0–52.0)
Hemoglobin: 15.6 g/dL (ref 13.0–17.0)
MCH: 31 pg (ref 26.0–34.0)
MCHC: 32.6 g/dL (ref 30.0–36.0)
MCV: 95 fL (ref 80.0–100.0)
Platelets: 275 10*3/uL (ref 150–400)
RBC: 5.04 MIL/uL (ref 4.22–5.81)
RDW: 12.3 % (ref 11.5–15.5)
WBC: 10.3 10*3/uL (ref 4.0–10.5)
nRBC: 0 % (ref 0.0–0.2)

## 2019-03-29 LAB — LIPASE, BLOOD: Lipase: 21 U/L (ref 11–51)

## 2019-03-29 MED ORDER — FENTANYL CITRATE (PF) 100 MCG/2ML IJ SOLN
50.0000 ug | Freq: Once | INTRAMUSCULAR | Status: AC
  Administered 2019-03-29: 50 ug via INTRAVENOUS
  Filled 2019-03-29: qty 2

## 2019-03-29 MED ORDER — FENTANYL CITRATE (PF) 100 MCG/2ML IJ SOLN
50.0000 ug | Freq: Once | INTRAMUSCULAR | Status: AC
  Administered 2019-03-29: 21:00:00 50 ug via INTRAVENOUS
  Filled 2019-03-29: qty 2

## 2019-03-29 MED ORDER — ENOXAPARIN SODIUM 40 MG/0.4ML ~~LOC~~ SOLN: 40.0000 mg | SUBCUTANEOUS | Status: DC

## 2019-03-29 MED ORDER — MORPHINE SULFATE (PF) 2 MG/ML IV SOLN: 2.0000 mg | INTRAVENOUS | Status: DC | PRN

## 2019-03-29 MED ORDER — SODIUM CHLORIDE 0.9 % IV BOLUS
1000.0000 mL | Freq: Once | INTRAVENOUS | Status: AC
  Administered 2019-03-29: 1000 mL via INTRAVENOUS

## 2019-03-29 MED ORDER — IOHEXOL 300 MG/ML  SOLN
100.0000 mL | Freq: Once | INTRAMUSCULAR | Status: AC | PRN
  Administered 2019-03-29: 19:00:00 100 mL via INTRAVENOUS

## 2019-03-29 MED ORDER — LACTATED RINGERS IV SOLN: INTRAVENOUS | Status: DC

## 2019-03-29 NOTE — ED Notes (Signed)
Patient transported to CT 

## 2019-03-29 NOTE — Discharge Instructions (Signed)
We recommended you were admitted tonight for monitoring and reevaluation in the morning by the surgeon for possible acute appendicitis. By leaving today Arthur Lutz, there is risk of severe infection which could ultimately be fatal. Call your primary care provider tomorrow morning for close follow-up. Please return to the emergency department if you develop fever or worsening symptoms.

## 2019-03-29 NOTE — ED Provider Notes (Signed)
[facility] EMERGENCY DEPARTMENT Provider Note   CSN: [id number] Arrival date & time: 03/29/19  1418     History   Chief Complaint Chief Complaint  Patient presents with  . Diarrhea    HPI Arthur Lutz is a 78 y.o. male past medical history of type 2 diabetes, hypertension, CAD, presenting to the emergency department with 8 days of diarrhea.  He has about 2 episodes per day,, sometimes watery and sometimes more of a soft stool.  He denies blood in his stools.  He does endorse some associated intermittent left lower abdominal cramping.  He has associated decreased appetite.  Denies fever, vomiting, urinary symptoms.  He has been taking Imodium for his diarrhea.  He was evaluated at urgent care on 03/23/2019 for this. Patient has second complaint of right lower back pain that began yesterday.  He thinks he twisted wrong.  He has had similar pain in the past.  It is worse with movement.  He states sometimes he feels as though his right leg wants to give out.  He denies any numbness to his legs, bladder incontinence, saddle paresthesias.  He has been treating his symptoms with Aleve.     The history is provided by the patient and medical records.    Past Medical History:  Diagnosis Date  . Allergic rhinitis   . Aortic valve sclerosis    Cause of murmur  . Arthritis of knee, left, needs total Knee in future with Dr. Wynelle Link  08/07/2011  . Bladder cancer ([place]) 11/2005  . CAD S/P percutaneous coronary angioplasty 08/07/2011   NEW 08/07/11 cutting balloon PTCA to 95% ostial diag. and 90%  proximal LCX.  prior cutting balloon and to RCA  & ostium of ostial diag; Last Cath 02/2012 - Patent Diag PTCA, RCA & Circ BMS stents. Normal EF & EDP; Myoview 2/14 no evidence of ischemia or infarction  . Diabetes mellitus type 2, controlled, with complications ([place])   . Diabetes mellitus without complication ([place])   . Dyslipidemia   . History of heart attack 11/2007   "mild; did not affect the heart muscle";  PCI to RCA, Cutting PTCA Diag ostium  . Hypertension   . Macular degeneration   . Macular degeneration of right eye    Now getting shots.   . Melanoma of skin ([place]) ~ 2005   "level 2; on my back"  . Myocardial infarction ([place]) 2012  . Osteoarthritis    Status post right knee arthroplasty, positive for staph infection. Pending right arthroplasty  . Portacath in place 08/11/2012  . Statin intolerance 08/07/2011  . Stroke [facility]) 1980's   "had facial strokes; 2; about a year apart; never did find what caused them"    Patient Active Problem List   Diagnosis Date Noted  . Abdominal pain 03/29/2019  . Perennial allergic rhinitis 01/06/2019  . Sinus bradycardia 08/29/2017  . Chronic stable angina ([place]) - negative Myoview & no new Dz on Cath 05/23/2015  . Insomnia due to stress 07/04/2014    Class: Chronic  . Venous stasis of both lower extremities 11/24/2013  . Abnormal biliary HIDA scan 09/15/2013  . Intestinal bacterial overgrowth 08/16/2013  . Bloating 08/16/2013  . Obesity (BMI 30.0-34.9) 01/30/2013  . Stiffness of joint, not elsewhere classified, lower leg 10/26/2012  . Weakness of left leg 10/26/2012  . Difficulty in walking 10/26/2012  . Postoperative anemia due to acute blood loss 09/29/2012  . OA (osteoarthritis) of knee 09/28/2012  . Portacath in place 08/11/2012  .  CAD S/P percutaneous coronary angioplasty: 08/07/11 cutting balloon PTCA to 95% ostial diag. and 90%  proximal LCX.  prior cutting balloon to RCA  & ostium of ostial diag., 2009  08/07/2011  . Essential hypertension 08/07/2011  . DOE (dyspnea on exertion) 08/07/2011  . Diabetes mellitus type 2, controlled, with complications ([place]) 123456  . Hyperlipemia, intolerant to statins. Goal LDL < 70 08/07/2011  . Arthritis of knee, left, needs total Knee in future with Dr. Wynelle Link  08/07/2011  . Statin intolerance 08/07/2011    Past Surgical History:  Procedure Laterality Date  . CARDIAC CATHETERIZATION  August '13    Widely patent RCA and LCx stents. Also patent PTCA site to D1, ~40-50% D2. Normal EF, normal EDP  . CATARACT EXTRACTION W/ INTRAOCULAR LENS IMPLANT  ~ 2010   right  . CATARACT EXTRACTION W/PHACO Left 11/21/2016   Procedure: CATARACT EXTRACTION PHACO AND INTRAOCULAR LENS PLACEMENT (IOC) CDE - 11.70 ;  Surgeon: Tonny Branch, MD;  Location: [facility] ORS;  Service: Ophthalmology;  Laterality: Left;  left  . cold cup removal bladder lesion  11/2005   "malignant"  . COLONOSCOPY N/A 12/09/2014   Procedure: COLONOSCOPY;  Surgeon: Rogene Houston, MD;  Location: [facility] ENDO SUITE;  Service: Endoscopy;  Laterality: N/A;  955 - moved to 8:30 - Ann to notify pt  . CORONARY ANGIOPLASTY WITH STENT PLACEMENT  11/2007   Mid RCA - Driver BMS 3.5 mm x 15 mm; 2.0 mm Cutting Balloon PTCA - ostial D1  . CORONARY ANGIOPLASTY WITH STENT PLACEMENT  08/07/11   Abnormal TM Myoview - for exertional throat discomfort: Patent RCA stent, 90% circumflex -- Vision BMS 3.5 mm x 12 mm --> 4.2 mm. Ostial D2 95% -- 2.25 mm Cutting Balloon PTCA  . DOPPLER ECHOCARDIOGRAPHY  May 2009   Normal EF, impaired relaxation, with sclerotic aortic valve. No stenosis.  . INGUINAL HERNIA REPAIR  ~ 1970   left  . KNEE ARTHROSCOPY  02/20/11   left  . LEFT HEART CATHETERIZATION WITH CORONARY ANGIOGRAM N/A 08/07/2011   Procedure: LEFT HEART CATHETERIZATION WITH CORONARY ANGIOGRAM;  Surgeon: Leonie Man, MD;  Location: [facility] CATH LAB;  Service: Cardiovascular;  Laterality: N/A;  . LEFT HEART CATHETERIZATION WITH CORONARY ANGIOGRAM N/A 03/06/2012   Procedure: LEFT HEART CATHETERIZATION WITH CORONARY ANGIOGRAM;  Surgeon: Leonie Man, MD;  Location: [facility] CATH LAB;  Service: Cardiovascular;  Laterality: N/A;  . NM MYOVIEW LTD  Jan. 31, 2014   Low risk, EF 49%, improved from prior. Small apical and apical septal defect, fixed likely artifact no skin or infarction.  Marland Kitchen PARTIAL KNEE ARTHROPLASTY  06/2003   right  . PERCUTANEOUS CORONARY STENT INTERVENTION (PCI-S)   08/07/2011   Procedure: PERCUTANEOUS CORONARY STENT INTERVENTION (PCI-S);  Surgeon: Leonie Man, MD;  Location: [facility] CATH LAB;  Service: Cardiovascular;;  . TONSILLECTOMY     "as a child"  . TOTAL KNEE ARTHROPLASTY  11/2003   right x2 and one on left- Total of 3.  . TOTAL KNEE ARTHROPLASTY Left 09/28/2012   Procedure: TOTAL KNEE ARTHROPLASTY;  Surgeon: Gearlean Alf, MD;  Location: [facility] ORS;  Service: Orthopedics;  Laterality: Left;        Home Medications    Prior to Admission medications   Medication Sig Start Date End Date Taking? Authorizing Provider  amLODipine (NORVASC) 10 MG tablet TAKE 1 TABLET(10 MG) BY MOUTH DAILY 01/15/19   Leonie Man, MD  Ascorbic Acid (VITAMIN C) 1000 MG tablet Take 1,000 mg  by mouth daily.    [provider]  Azelastine HCl 0.15 % SOLN Place 1-2 sprays into both nostrils 2 (two) times daily. 03/22/19   Valentina Shaggy, MD  cetirizine (ZYRTEC) 5 MG tablet Take 1 tablet (5 mg total) by mouth daily. 03/23/19   [place], [place], PA-C  Cholecalciferol (VITAMIN D-1000 MAX ST) 25 MCG (1000 UT) tablet Take by mouth.    [provider]  clobetasol (TEMOVATE) 0.05 % external solution APP EXT AA QD 03/01/19   [provider]  clobetasol cream (TEMOVATE) [id number] % Apply 1 application topically 2 (two) times daily.    [provider]  Coenzyme Q10 (CO Q-10) 100 MG CAPS Take 300 capsules by mouth 3 (three) times daily. 08/29/17   Leonie Man, MD  EPINEPHrine 0.3 mg/0.3 mL IJ SOAJ injection INJECT 1 SYRINGE IM ONCE FOR SEVERE ALLERGIC REACTION 12/25/18   [provider]  ezetimibe (ZETIA) 10 MG tablet TAKE 1 TABLET(10 MG) BY MOUTH DAILY 09/28/18   Leonie Man, MD  fluticasone [facility]) 50 MCG/ACT nasal spray fluticasone propionate 50 mcg/actuation nasal spray,suspension  SHAKE LQ AND U 1 TO 2 SPRAYS IEN QD    [provider]  Fluticasone Propionate (XHANCE) 93 MCG/ACT [place] 2 application into the nose 2  (two) times a day. 02/19/19   Charlies Silvers, MD  hydrochlorothiazide (HYDRODIURIL) 25 MG tablet TAKE 1 TABLET BY MOUTH DAILY 12/08/18   Leonie Man, MD  HYDROcodone-acetaminophen (NORCO/VICODIN) 5-325 MG tablet Take 1 tablet by mouth every 6 (six) hours as needed for severe pain. 02/01/17   Emeline General, PA-C  ibuprofen (ADVIL,MOTRIN) 600 MG tablet Take 1 tablet (600 mg total) by mouth every 6 (six) hours as needed. 02/01/17   Avie Echevaria B, PA-C  ipratropium (ATROVENT) 0.06 % nasal spray ipratropium bromide 42 mcg (0.06 %) nasal spray  USE 2 SPRAYS IEN TID PRN    [provider]  isosorbide mononitrate (IMDUR) 60 MG 24 hr tablet TAKE 1 TABLET BY MOUTH DAILY 02/15/19   Leonie Man, MD  lisinopril (ZESTRIL) 40 MG tablet Take 0.5 tablets (20 mg total) by mouth daily. 11/19/18 11/14/19  Leonie Man, MD  meloxicam (MOBIC) 7.5 MG tablet Take 7.5 mg by mouth daily.    [provider]  metFORMIN (GLUCOPHAGE) 500 MG tablet Take 500 mg by mouth daily with breakfast.     [provider]  montelukast (SINGULAIR) 10 MG tablet montelukast 10 mg tablet    [provider]  Multiple Vitamins-Minerals (EYE-VITES PO) Take by mouth. Tozol    [provider]  Multiple Vitamins-Minerals (PRESERVISION AREDS PO) Take 1 capsule by mouth daily.     [provider]  MYRBETRIQ 50 MG TB24 tablet TK 1 T PO D 02/08/19   [provider]  nystatin-triamcinolone (MYCOLOG II) cream Apply 1 application topically 2 (two) times daily. APPLY TO AFFECTED AREA    [provider]  Omega-3 Fatty Acids (FISH OIL) 1000 MG CAPS Take 3 capsules (3,000 mg total) by mouth daily. 08/29/17   Leonie Man, MD  simvastatin (ZOCOR) 10 MG tablet TAKE 1 TABLET BY MOUTH DAILY AT 6 PM 01/18/19   Leonie Man, MD  tamsulosin [facility]) 0.4 MG CAPS capsule Take by mouth. 02/08/19   [provider]  triazolam (HALCION) 0.125 MG tablet Take 1.5 tablets  (0.1875 mg total) by mouth at bedtime. Pt takes 1.5 tabs at bedtime 08/29/17   Leonie Man, MD  Family History Family History  Problem Relation Age of Onset  . Emphysema Father   . Lung cancer Paternal Uncle        x 2 smokers  . Colon cancer Neg Hx     Social History Social History   Tobacco Use  . Smoking status: Former Smoker    Packs/day: 2.00    Years: 28.00    Pack years: 56.00    Types: Cigarettes    Quit date: 07/29/1980    Years since quitting: 38.6  . Smokeless tobacco: Never Used  Substance Use Topics  . Alcohol use: Yes    Alcohol/week: 2.0 standard drinks    Types: 1 Glasses of wine, 1 Standard drinks or equivalent per week    Comment:  "wine or whiskey" occasionally  . Drug use: No     Allergies   Rosuvastatin calcium   Review of Systems Review of Systems  Constitutional: Positive for appetite change (decreased). Negative for fever.  Respiratory: Negative for cough.   Gastrointestinal: Positive for abdominal pain, diarrhea and nausea. Negative for vomiting.  Genitourinary: Negative for dysuria and frequency.  Musculoskeletal: Positive for back pain.  Neurological: Negative for weakness and numbness.  All other systems reviewed and are negative.    Physical Exam Updated Vital Signs BP 136/68 (BP Location: Left Arm)   Pulse (!) 55   Temp 98.4 F (36.9 C) (Oral)   Resp 14   SpO2 93%   Physical Exam Vitals signs and nursing note reviewed.  Constitutional:      General: He is not in acute distress.    Appearance: He is well-developed. He is not ill-appearing.  HENT:     Head: Normocephalic and atraumatic.  Eyes:     Conjunctiva/sclera: Conjunctivae normal.  Cardiovascular:     Rate and Rhythm: Normal rate and regular rhythm.  Pulmonary:     Effort: Pulmonary effort is normal. No respiratory distress.     Breath sounds: Normal breath sounds.  Abdominal:     General: Bowel sounds are normal.     Palpations: Abdomen is soft.      Tenderness: There is abdominal tenderness. There is no guarding or rebound.       Comments: Right mid abdomen with tenderness.  Musculoskeletal:     Comments: Tenderness to palpation of the right lower lumbar musculature  Skin:    General: Skin is warm.  Neurological:     Mental Status: He is alert.  Psychiatric:        Behavior: Behavior normal.      ED Treatments / Results  Labs (all labs ordered are listed, but only abnormal results are displayed) Labs Reviewed  COMPREHENSIVE METABOLIC PANEL - Abnormal; Notable for the following components:      Result Value   Glucose, Bld 188 (*)    All other components within normal limits  URINALYSIS, ROUTINE W REFLEX MICROSCOPIC - Abnormal; Notable for the following components:   Glucose, UA >=500 (*)    All other components within normal limits  LIPASE, BLOOD  CBC  CBC WITH DIFFERENTIAL/PLATELET  BASIC METABOLIC PANEL    EKG None  Radiology Ct Abdomen Pelvis W Contrast  Addendum Date: 03/29/2019   ADDENDUM REPORT: 03/29/2019 20:48 ADDENDUM: There is equivocal mild circumferential wall thickening of the proximal colon which could represent a colitis. Appendix findings could be related to a colitis if present. These findings were discussed with Dr. Lacinda Axon on 03/29/2019 at 8:45 p.m. Electronically Signed   By: Dellis Filbert  Hu M.D.   On: 03/29/2019 20:48   Result Date: 03/29/2019 CLINICAL DATA:  78 year old male with acute LEFT abdominal and pelvic pain and diarrhea for 1 week. EXAM: CT ABDOMEN AND PELVIS WITH CONTRAST TECHNIQUE: Multidetector CT imaging of the abdomen and pelvis was performed using the standard protocol following bolus administration of intravenous contrast. CONTRAST:  113mL OMNIPAQUE IOHEXOL 300 MG/ML  SOLN COMPARISON:  Prior chest CTs FINDINGS: Lower chest: No acute abnormality. Nodules along the RIGHT minor fissure are unchanged from 2014. Hepatobiliary: The liver is unremarkable except for hepatic cysts. The gallbladder is  unremarkable. No biliary dilatation. Pancreas: Unremarkable Spleen: Unremarkable Adrenals/Urinary Tract: The kidneys, adrenal glands and bladder are unremarkable except for a punctate nonobstructing mid RIGHT renal calculus and RIGHT renal cysts. Stomach/Bowel: The appendix is enlarged measuring up to 10 cm in greatest diameter with equivocal mild adjacent inflammation suspicious for acute appendicitis. No free fluid, pneumoperitoneum, abscess or bowel obstruction. Colonic diverticulosis noted without evidence of diverticulitis. Vascular/Lymphatic: Aortic atherosclerosis. No enlarged abdominal or pelvic lymph nodes. Reproductive: Prostate enlargement noted. Other: No ascites or abdominal wall hernia. Musculoskeletal: No acute or suspicious bony abnormalities. IMPRESSION: 1. Enlarged appendix with equivocal mild adjacent inflammation suspicious for acute appendicitis. No free fluid, pneumoperitoneum or bowel obstruction. 2. Prostatomegaly. 3.  Aortic Atherosclerosis (ICD10-I70.0). Electronically Signed: By: Margarette Canada M.D. On: 03/29/2019 19:36    Procedures Procedures (including critical care time)  Medications Ordered in ED Medications  enoxaparin (LOVENOX) injection 40 mg (has no administration in time range)  morphine 2 MG/ML injection 2 mg (has no administration in time range)  lactated ringers infusion (has no administration in time range)  sodium chloride 0.9 % bolus 1,000 mL (0 mLs Intravenous Stopped 03/29/19 2000)  fentaNYL (SUBLIMAZE) injection 50 mcg (50 mcg Intravenous Given 03/29/19 1847)  iohexol (OMNIPAQUE) 300 MG/ML solution 100 mL (100 mLs Intravenous Contrast Given 03/29/19 1902)  fentaNYL (SUBLIMAZE) injection 50 mcg (50 mcg Intravenous Given 03/29/19 2032)     Initial Impression / Assessment and Plan / ED Course  I have reviewed the triage vital signs and the nursing notes.  Pertinent labs & imaging results that were available during my care of the patient were reviewed by me and  considered in my medical decision making (see chart for details).    Patient presenting with 1 week of diarrhea, intermittent left lower quadrant abdominal pain, and 2 days of right-sided low back pain.  He has been treating at home with Imodium, evaluated by urgent care and PCP without relief.  No recent antibiotics to suggest C. difficile.  He is afebrile without respiratory complaints.  Abdominal exam reveals right mid abdominal tenderness.  He is also very uncomfortable regarding right lower back pain.  No red flags regarding back pain.  IV pain medications, fluids, labs and CT scan ordered.  CBC with white count of 10.3.  CMP and lipase are unremarkable.  UA without infection.  CT scan with enlarged inflamed appendix, no abscess of perforation. No MSK findings.  Clinical Course as of Mar 28 2138  Mon Mar 29, 2019  2006 Patient was discussed with surgeon, Dr. Constance Haw.  She recommends at this time admit overnight for observation, recheck blood counts in the morning for any new elevation in white cell count.  Recommends we hold administration of antibiotics at this time as CT scan is not as convincing for acute appendicitis.     [JR]  2015 Discussed results and recommendations per surgeon with patient.  Patient states he  refuses to consent to the COVID test for admission.  Discussed with patient that he would require COVID test prior to surgery as well.  He states if he needs it tomorrow before surgery then he will do it however he will not get a COVID test today for admission.  I also discussed with patient that there are some atypical presentations for the COVID virus, given patient's symptoms there is a slight possibility of COVID virus as well.  Patient still declined.  I discussed risks of leaving [place] including severe infection which may lead to death.  Patient verbalized understanding of risks.  He is instructed to follow-up closely with his PCP and return to the emergency  department should he develop any worsening symptoms, fever, or change his mind.   [JR]  2030 Patient was discussed with and evaluated by Dr. Lacinda Axon.  He is aware of patient's decision to leave [place].  Dr. Constance Haw also made aware.  Appreciate consult.   [JR]    Clinical Course User Index [JR] , [place] N, PA-C        Final Clinical Impressions(s) / ED Diagnoses   Final diagnoses:  Abdominal pain, unspecified abdominal location  Diarrhea in adult patient    ED Discharge Orders    None       , [place] N, PA-C 03/29/19 2156    Nat Christen, MD 03/31/19 [phone]

## 2019-03-29 NOTE — Progress Notes (Signed)
[facility]  Patient with abdominal pain and diarrhea for 8 days. Unlikely to be appendicitis, CT reviewed and appendix is a little dilated but very minimal stranding around the appendix. I think it is unlikely that he has appendicitis. Colonoscopy in 2016 in chart.    Recommended admission for pain control. Repeat labs in the AM, no antibiotics.  This would help [place] rule in or out appendicitis. Patient also could be presenting with an atypical COVID based on symptoms.  He is currently refusing COVID testing and will have to leave AMA if this is the case with precautions to return with worsening pain, fevers, etc.  Discussed with PA. Appreciate assistance.  Curlene Labrum, MD [facility] [address] [place], [place] [id number] [phone] (office)

## 2019-03-29 NOTE — Progress Notes (Signed)
Addition to previous note. Also no leukocytosis or fevers. Patient is leaving AMA.  If returns will need repeat labs. And COVID test.  Curlene Labrum, MD

## 2019-03-29 NOTE — ED Triage Notes (Signed)
diarrhea x 8 days, seen at urgent care x 2.  Given imodium with no help.  Also pain to Lower RT back after twisting the wrong way yesterday.

## 2019-03-29 NOTE — ED Notes (Signed)
Pt refused to have covid test done prior to admission.  Pt signed AMA form and stated that if he got worse that he would go to another hospital.

## 2019-03-30 ENCOUNTER — Other Ambulatory Visit: Payer: Self-pay

## 2019-03-30 ENCOUNTER — Encounter (HOSPITAL_COMMUNITY): Payer: Self-pay | Admitting: Emergency Medicine

## 2019-03-30 ENCOUNTER — Emergency Department (HOSPITAL_COMMUNITY)
Admission: EM | Admit: 2019-03-30 | Discharge: 2019-03-30 | Disposition: A | Discharge status: Home / Self Care | Payer: Medicare Other | Attending: Emergency Medicine | Admitting: Emergency Medicine

## 2019-03-30 DIAGNOSIS — Z8551 Personal history of malignant neoplasm of bladder: Secondary | ICD-10-CM | POA: Diagnosis not present

## 2019-03-30 DIAGNOSIS — Z96652 Presence of left artificial knee joint: Secondary | ICD-10-CM | POA: Diagnosis not present

## 2019-03-30 DIAGNOSIS — R109 Unspecified abdominal pain: Secondary | ICD-10-CM | POA: Diagnosis present

## 2019-03-30 DIAGNOSIS — E119 Type 2 diabetes mellitus without complications: Secondary | ICD-10-CM | POA: Diagnosis not present

## 2019-03-30 DIAGNOSIS — Z96651 Presence of right artificial knee joint: Secondary | ICD-10-CM | POA: Diagnosis not present

## 2019-03-30 DIAGNOSIS — K529 Noninfective gastroenteritis and colitis, unspecified: Secondary | ICD-10-CM | POA: Diagnosis not present

## 2019-03-30 DIAGNOSIS — I10 Essential (primary) hypertension: Secondary | ICD-10-CM | POA: Insufficient documentation

## 2019-03-30 DIAGNOSIS — Z85820 Personal history of malignant melanoma of skin: Secondary | ICD-10-CM | POA: Insufficient documentation

## 2019-03-30 DIAGNOSIS — Z5329 Procedure and treatment not carried out because of patient's decision for other reasons: Secondary | ICD-10-CM | POA: Insufficient documentation

## 2019-03-30 DIAGNOSIS — Z87891 Personal history of nicotine dependence: Secondary | ICD-10-CM | POA: Diagnosis not present

## 2019-03-30 DIAGNOSIS — Z955 Presence of coronary angioplasty implant and graft: Secondary | ICD-10-CM | POA: Insufficient documentation

## 2019-03-30 DIAGNOSIS — I2510 Atherosclerotic heart disease of native coronary artery without angina pectoris: Secondary | ICD-10-CM | POA: Diagnosis not present

## 2019-03-30 LAB — CBC
HCT: 44.9 % (ref 39.0–52.0)
Hemoglobin: 14.9 g/dL (ref 13.0–17.0)
MCH: 31.4 pg (ref 26.0–34.0)
MCHC: 33.2 g/dL (ref 30.0–36.0)
MCV: 94.7 fL (ref 80.0–100.0)
Platelets: 241 10*3/uL (ref 150–400)
RBC: 4.74 MIL/uL (ref 4.22–5.81)
RDW: 12.6 % (ref 11.5–15.5)
WBC: 11.2 10*3/uL — ABNORMAL HIGH (ref 4.0–10.5)
nRBC: 0 % (ref 0.0–0.2)

## 2019-03-30 LAB — URINALYSIS, ROUTINE W REFLEX MICROSCOPIC
Bacteria, UA: NONE SEEN
Bilirubin Urine: NEGATIVE
Glucose, UA: >=500 mg/dL — AB
Hgb urine dipstick: NEGATIVE
Ketones, ur: NEGATIVE mg/dL
Leukocytes,Ua: NEGATIVE
Nitrite: NEGATIVE
Protein, ur: NEGATIVE mg/dL
Specific Gravity, Urine: 1.016 (ref 1.005–1.030)
pH: 5 (ref 5.0–8.0)

## 2019-03-30 LAB — COMPREHENSIVE METABOLIC PANEL
ALT: 27 U/L (ref 0–44)
AST: 21 U/L (ref 15–41)
Albumin: 3.6 g/dL (ref 3.5–5.0)
Alkaline Phosphatase: 59 U/L (ref 38–126)
Anion gap: 12 (ref 5–15)
BUN: 15 mg/dL (ref 8–23)
CO2: 29 mmol/L (ref 22–32)
Calcium: 9.1 mg/dL (ref 8.9–10.3)
Chloride: 100 mmol/L (ref 98–111)
Creatinine, Ser: 0.82 mg/dL (ref 0.61–1.24)
GFR calc Af Amer: >60 mL/min (ref >60)
GFR calc non Af Amer: >60 mL/min (ref >60)
Glucose, Bld: 111 mg/dL — ABNORMAL HIGH (ref 70–99)
Potassium: 3.6 mmol/L (ref 3.5–5.1)
Sodium: 141 mmol/L (ref 135–145)
Total Bilirubin: 0.8 mg/dL (ref 0.3–1.2)
Total Protein: 6.3 g/dL — ABNORMAL LOW (ref 6.5–8.1)

## 2019-03-30 LAB — LIPASE, BLOOD: Lipase: 22 U/L (ref 11–51)

## 2019-03-30 MED ORDER — HYDROCODONE-ACETAMINOPHEN 5-325 MG PO TABS
1.0000 | ORAL_TABLET | Freq: Once | ORAL | Status: AC
  Administered 2019-03-30: 1 via ORAL
  Filled 2019-03-30: qty 1

## 2019-03-30 MED ORDER — CYCLOBENZAPRINE HCL 10 MG PO TABS: 10.0000 mg | ORAL_TABLET | Freq: Two times a day (BID) | ORAL | 0 refills | Status: DC | PRN

## 2019-03-30 MED ORDER — LOPERAMIDE HCL 2 MG PO CAPS: 2.0000 mg | ORAL_CAPSULE | Freq: Four times a day (QID) | ORAL | 0 refills | Status: AC | PRN

## 2019-03-30 NOTE — ED Triage Notes (Signed)
Pt c/o flank pains for month. reports he was seen at [facility] last night and did a scan which symptoms showed possibility of appendicitis and to be admitted and observed with possibility of surgery. Pt left due to not wanting to say there or have surgery there.

## 2019-03-30 NOTE — ED Provider Notes (Signed)
[facility] Emergency Department Provider Note MRN:  [id number]  Arrival date & time: 03/30/19     Chief Complaint   Flank Pain   History of Present Illness   Arthur Lutz is a 78 y.o. year-old male with a history of CAD, diabetes presenting to the ED with chief complaint of flank pain.  Patient has been having diarrhea for 9 days.  Also endorsing low back pain that radiates to the right buttocks, worse with motion.  Was seen at [facility] last night and left AGAINST MEDICAL ADVICE.  Was told that he might need surgery.  Here to get the help he needs at a different hospital.  Denies fever, no vomiting, no chest pain or shortness of breath.  No abdominal pain.  Review of Systems  A complete 10 system review of systems was obtained and all systems are negative except as noted in the HPI and PMH.   Patient's Health History    Past Medical History:  Diagnosis Date  . Allergic rhinitis   . Aortic valve sclerosis    Cause of murmur  . Arthritis of knee, left, needs total Knee in future with Dr. Wynelle Link  08/07/2011  . Bladder cancer ([place]) 11/2005  . CAD S/P percutaneous coronary angioplasty 08/07/2011   NEW 08/07/11 cutting balloon PTCA to 95% ostial diag. and 90%  proximal LCX.  prior cutting balloon and to RCA  & ostium of ostial diag; Last Cath 02/2012 - Patent Diag PTCA, RCA & Circ BMS stents. Normal EF & EDP; Myoview 2/14 no evidence of ischemia or infarction  . Diabetes mellitus type 2, controlled, with complications ([place])   . Diabetes mellitus without complication ([place])   . Dyslipidemia   . History of heart attack 11/2007   "mild; did not affect the heart muscle"; PCI to RCA, Cutting PTCA Diag ostium  . Hypertension   . Macular degeneration   . Macular degeneration of right eye    Now getting shots.   . Melanoma of skin ([place]) ~ 2005   "level 2; on my back"  . Myocardial infarction ([place]) 2012  . Osteoarthritis    Status post right knee arthroplasty, positive  for staph infection. Pending right arthroplasty  . Portacath in place 08/11/2012  . Statin intolerance 08/07/2011  . Stroke [facility]) 1980's   "had facial strokes; 2; about a year apart; never did find what caused them"    Past Surgical History:  Procedure Laterality Date  . CARDIAC CATHETERIZATION  August '13   Widely patent RCA and LCx stents. Also patent PTCA site to D1, ~40-50% D2. Normal EF, normal EDP  . CATARACT EXTRACTION W/ INTRAOCULAR LENS IMPLANT  ~ 2010   right  . CATARACT EXTRACTION W/PHACO Left 11/21/2016   Procedure: CATARACT EXTRACTION PHACO AND INTRAOCULAR LENS PLACEMENT (IOC) CDE - 11.70 ;  Surgeon: Tonny Branch, MD;  Location: [facility] ORS;  Service: Ophthalmology;  Laterality: Left;  left  . cold cup removal bladder lesion  11/2005   "malignant"  . COLONOSCOPY N/A 12/09/2014   Procedure: COLONOSCOPY;  Surgeon: Rogene [place], MD;  Location: [facility] ENDO SUITE;  Service: Endoscopy;  Laterality: N/A;  955 - moved to 8:30 - Ann to notify pt  . CORONARY ANGIOPLASTY WITH STENT PLACEMENT  11/2007   Mid RCA - Driver BMS 3.5 mm x 15 mm; 2.0 mm Cutting Balloon PTCA - ostial D1  . CORONARY ANGIOPLASTY WITH STENT PLACEMENT  08/07/11   Abnormal TM Myoview - for exertional throat  discomfort: Patent RCA stent, 90% circumflex -- Vision BMS 3.5 mm x 12 mm --> 4.2 mm. Ostial D2 95% -- 2.25 mm Cutting Balloon PTCA  . DOPPLER ECHOCARDIOGRAPHY  May 2009   Normal EF, impaired relaxation, with sclerotic aortic valve. No stenosis.  . INGUINAL HERNIA REPAIR  ~ 1970   left  . KNEE ARTHROSCOPY  02/20/11   left  . LEFT HEART CATHETERIZATION WITH CORONARY ANGIOGRAM N/A 08/07/2011   Procedure: LEFT HEART CATHETERIZATION WITH CORONARY ANGIOGRAM;  Surgeon: Leonie Man, MD;  Location: [facility] CATH LAB;  Service: Cardiovascular;  Laterality: N/A;  . LEFT HEART CATHETERIZATION WITH CORONARY ANGIOGRAM N/A 03/06/2012   Procedure: LEFT HEART CATHETERIZATION WITH CORONARY ANGIOGRAM;  Surgeon: Leonie Man, MD;  Location: [facility] CATH  LAB;  Service: Cardiovascular;  Laterality: N/A;  . NM MYOVIEW LTD  Jan. 31, 2014   Low risk, EF 49%, improved from prior. Small apical and apical septal defect, fixed likely artifact no skin or infarction.  Marland Kitchen PARTIAL KNEE ARTHROPLASTY  06/2003   right  . PERCUTANEOUS CORONARY STENT INTERVENTION (PCI-S)  08/07/2011   Procedure: PERCUTANEOUS CORONARY STENT INTERVENTION (PCI-S);  Surgeon: Leonie Man, MD;  Location: [facility] CATH LAB;  Service: Cardiovascular;;  . TONSILLECTOMY     "as a child"  . TOTAL KNEE ARTHROPLASTY  11/2003   right x2 and one on left- Total of 3.  . TOTAL KNEE ARTHROPLASTY Left 09/28/2012   Procedure: TOTAL KNEE ARTHROPLASTY;  Surgeon: Gearlean Alf, MD;  Location: [facility] ORS;  Service: Orthopedics;  Laterality: Left;    Family History  Problem Relation Age of Onset  . Emphysema Father   . Lung cancer Paternal Uncle        x 2 smokers  . Colon cancer Neg Hx     Social History   Socioeconomic History  . Marital status: Married    Spouse name: Not on file  . Number of children: Not on file  . Years of education: Not on file  . Highest education level: Not on file  Occupational History  . 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 Financial resource strain: Not on file  . Food insecurity    Worry: Not on file    Inability: Not on file  . Transportation needs    Medical: Not on file    Non-medical: Not on file  Tobacco Use  . Smoking status: Former Smoker    Packs/day: 2.00    Years: 28.00    Pack years: 56.00    Types: Cigarettes    Quit date: 07/29/1980    Years since quitting: 38.6  . Smokeless tobacco: Never Used  Substance and Sexual Activity  . Alcohol use: Yes    Alcohol/week: 2.0 standard drinks    Types: 1 Glasses of wine, 1 Standard drinks or equivalent per week    Comment:  "wine or whiskey" occasionally  . Drug use: No  . Sexual activity: Never  Lifestyle  . Physical activity    Days per week:  Not on file    Minutes per session: Not on file  . Stress: Not on file  Relationships  . Social Herbalist on phone: Not on file    Gets together: Not on file    Attends religious service: Not on file    Active member of club or organization: Not on file    Attends meetings of clubs or organizations:  Not on file    Relationship status: Not on file  . Intimate partner violence    Fear of current or ex partner: Not on file    Emotionally abused: Not on file    Physically abused: Not on file    Forced sexual activity: Not on file  Other Topics Concern  . Not on file  Social History Narrative   He is a married father of 2, grandfather of 53. He is not really getting a lot of exercise now. He previously had been working out on a treadmill at least 2 times a day for 15 minutes at a time, but he   is not able to do that as much now because his knee hurts him too bad. Does not smoke and only occasional alcoholic beverage.     Physical Exam  Vital Signs and Nursing Notes reviewed Vitals:   03/30/19 1830 03/30/19 1900  BP: (!) 182/80 (!) 185/92  Pulse: 76 79  Resp: 18 18  Temp:    SpO2: 93% 92%    CONSTITUTIONAL: Well-appearing, NAD NEURO:  Alert and oriented x 3, no focal deficits EYES:  eyes equal and reactive ENT/NECK:  no LAD, no JVD CARDIO: Regular rate, well-perfused, normal S1 and S2 PULM:  CTAB no wheezing or rhonchi GI/GU:  normal bowel sounds, non-distended, non-tender MSK/SPINE:  No gross deformities, no edema SKIN:  no rash, atraumatic PSYCH:  Appropriate speech and behavior  Diagnostic and Interventional Summary    Labs Reviewed  CBC - Abnormal; Notable for the following components:      Result Value   WBC 11.2 (*)    All other components within normal limits  COMPREHENSIVE METABOLIC PANEL - Abnormal; Notable for the following components:   Glucose, Bld 111 (*)    Total Protein 6.3 (*)    All other components within normal limits  URINALYSIS,  ROUTINE W REFLEX MICROSCOPIC - Abnormal; Notable for the following components:   Glucose, UA >=500 (*)    All other components within normal limits  LIPASE, BLOOD    No orders to display    Medications  HYDROcodone-acetaminophen (NORCO/VICODIN) 5-325 MG per tablet 1 tablet (1 tablet Oral Given 03/30/19 1747)     Procedures Critical Care  ED Course and Medical Decision Making  I have reviewed the triage vital signs and the nursing notes.  Pertinent labs & imaging results that were available during my care of the patient were reviewed by me and considered in my medical decision making (see below for details).  According to chart review, patient presented with diarrhea yesterday had any pain and had a CT scan which showed colitis and question of appendicitis due to a dilated appendix.  General surgery was consulted and they recommend a night of observation.  Patient refused coronavirus swab and therefore left AGAINST MEDICAL ADVICE.  He exhibits no tenderness to palpation to the abdomen on exam today.  Specifically no tenderness in the right lower quadrant.  He has no fever, normal vital signs.  His back pain seems musculoskeletal and related to sciatica, he has no bowel or bladder dysfunction, no numbness or weakness, nothing to suggest myelopathy.  Will repeat labs today to ensure no significant change, will consider touching base again with the surgeons, but this is much more consistent with a viral colitis and he will likely be discharged.  Labs are reassuring, largely unchanged.  Upon reevaluation patient continues to have no abdominal tenderness, specifically no McBurney's point tenderness.  He is  appropriate for discharge with return precautions.  Barth Kirks. Sedonia Small, [place] [email or web address]  Final Clinical Impressions(s) / ED Diagnoses     ICD-10-CM   1. Colitis  K52.9     ED Discharge Orders         Ordered    loperamide  (IMODIUM) 2 MG capsule  4 times daily PRN     03/30/19 1941    cyclobenzaprine (FLEXERIL) 10 MG tablet  2 times daily PRN     03/30/19 1942          Discharge Instructions Discussed with and Provided to Patient:   Discharge Instructions     You were evaluated in the Emergency Department and after careful evaluation, we did not find any emergent condition requiring admission or further testing in the hospital.  Your exam/testing today was overall reassuring.  We do not see any evidence of appendicitis today.  You can continue taking the loperamide medication for diarrhea, drinking plenty of water at home, and using the muscle relaxer provided for your back pain.  Please return to the Emergency Department if you experience any worsening of your condition.  We encourage you to follow up with a primary care provider.  Thank you for allowing [place] to be a part of your care.        Maudie Flakes, MD 03/30/19 1944

## 2019-03-30 NOTE — Discharge Instructions (Signed)
You were evaluated in the Emergency Department and after careful evaluation, we did not find any emergent condition requiring admission or further testing in the hospital.  Your exam/testing today was overall reassuring.  We do not see any evidence of appendicitis today.  You can continue taking the loperamide medication for diarrhea, drinking plenty of water at home, and using the muscle relaxer provided for your back pain.  Please return to the Emergency Department if you experience any worsening of your condition.  We encourage you to follow up with a primary care provider.  Thank you for allowing [place] to be a part of your care.

## 2019-04-01 NOTE — Progress Notes (Signed)
Paper work has been faxed

## 2019-04-01 NOTE — Progress Notes (Signed)
Received confirmation phone call that [facility] appeals department has received the paperwork.

## 2019-04-06 ENCOUNTER — Ambulatory Visit (INDEPENDENT_AMBULATORY_CARE_PROVIDER_SITE_OTHER): Payer: Medicare Other | Admitting: Nurse Practitioner

## 2019-04-06 ENCOUNTER — Encounter (INDEPENDENT_AMBULATORY_CARE_PROVIDER_SITE_OTHER): Payer: Self-pay | Admitting: Nurse Practitioner

## 2019-04-06 ENCOUNTER — Other Ambulatory Visit: Payer: Self-pay

## 2019-04-06 VITALS — BP 131/69 | HR 56 | Temp 98.2°F | Ht 72.0 in | Wt 248.8 lb

## 2019-04-06 DIAGNOSIS — K529 Noninfective gastroenteritis and colitis, unspecified: Secondary | ICD-10-CM

## 2019-04-06 DIAGNOSIS — R197 Diarrhea, unspecified: Secondary | ICD-10-CM | POA: Diagnosis not present

## 2019-04-06 NOTE — Progress Notes (Signed)
Subjective:    Patient ID: Arthur Lutz, male    DOB: October 08, 1940, 78 y.o.   MRN: [id number]  HPI Arthur Lutz is a 78 year old male with a past medical history significant for DM II, HTN, CAD, MI 2012, s/p coronary stent and PTCA 2013, stroke 1980's, macular degeneration, psoriatic arthritis, bladder cancer 2007, malignant melanoma to back x 2 occurrences 2005 and colon polyps. He presents today for further evaluation regarding diarrhea. He developed abrupt diarrhea on Sunday 03/21/2019. He ate a double hamburger at [employer] earlier in the day. No recent antibiotics. He reported having explosive mud to watery diarrhea, non-bloody x 4 to 5 episodes. No abdominal pain. He went on a bland diet. He continued to have diarrhea so he went to the urgent care clinic 8/25 and he was prescribed Loperamide. He continued to have diarrhea so he presented to [facility] Ed on 03/29/2019. He endorsed having right lower back pain at that time, no specific abdominal pain. His WBC was 10.3. An abdominal/pelvic CT showed an enlarged inflamed appendix with right colon wall thickening suggestive of colitis. Surgeon, Dr. Constance Haw was consulted, she recommended observation overnight and surgery was deferred. He refused to be tested for COVID19 and he left the hospital AMA. He continued to have diarrhea and right lower back pain so he presented to [facility] ED on 03/31/2019. His labs and exam were unremarkable. He was discharged home on Loperamide 31m po tid PRN and Flexaril 114mpo bid and to schedule a GI consult.   Labs 03/30/2019: Na 141. K 3.6. BUN 15. Cr. 0.82. Alk phos 59. Albumin 3.6. Lipase 22. AST 21. ALT 27. T. Bili 0.8. WBC 11.2 Hg 14.9. HCT 44.9. PLT 241.   Abdominal/pelvic CT 03/29/2019:  1. Enlarged appendix with equivocal mild adjacent inflammation suspicious for acute appendicitis. No free fluid, pneumoperitoneum or bowel obstruction. 2. Prostatomegaly. 3. Aortic Atherosclerosis  Addendum to CT  report: There is equivocal mild circumferential wall thickening of the proximal colon which could represent a colitis. Appendix findings could be related to a colitis if present.  He passed a mud like  stool yesterday with straining. No blood per the rectum. No melena. No abdominal pain or lower back pain. He passed a solid brown stool this morning with straining. He is eating a bland diet. No fever. He no longer takes ASA since being diagnosed with macular degeneration. He takes Meloxicam7.44m544mo PRN for arthritis, knee pain.   He underwent a colonoscopy by Dr. RehLaural Golden13/2016:  Prep excellent. 8 mm broad-based sessile serrated polyp at distal transverse colon was removed. 4 small hyperplastic polyps removed from distal sigmoid colon were removed.  Few small diverticula at sigmoid colon. Normal rectal mucosa. Small hemorrhoids above the dentate line. 32yr39yrall.   Past Medical History:  Diagnosis Date   Allergic rhinitis    Aortic valve sclerosis    Cause of murmur   Arthritis of knee, left, needs total Knee in future with Dr. AluiWynelle Link9/2013   Bladder cancer (HCC)[place]2007   CAD S/P percutaneous coronary angioplasty 08/07/2011   NEW 08/07/11 cutting balloon PTCA to 95% ostial diag. and 90%  proximal LCX.  prior cutting balloon and to RCA  & ostium of ostial diag; Last Cath 02/2012 - Patent Diag PTCA, RCA & Circ BMS stents. Normal EF & EDP; Myoview 2/14 no evidence of ischemia or infarction   Diabetes mellitus type 2, controlled, with complications (HCC)[place] Diabetes mellitus without complication (  [place])    Dyslipidemia    History of heart attack 11/2007   "mild; did not affect the heart muscle"; PCI to RCA, Cutting PTCA Diag ostium   Hypertension    Macular degeneration    Macular degeneration of right eye    Now getting shots.    Melanoma of skin ([place]) ~ 2005   "level 2; on my back"   Myocardial infarction [facility]) 2012   Osteoarthritis    Status post right knee  arthroplasty, positive for staph infection. Pending right arthroplasty   Portacath in place 08/11/2012   Statin intolerance 08/07/2011   Stroke [facility]) 1980's   "had facial strokes; 2; about a year apart; never did find what caused them"   Past Surgical History:  Procedure Laterality Date   CARDIAC CATHETERIZATION  August '13   Widely patent RCA and LCx stents. Also patent PTCA site to D1, ~40-50% D2. Normal EF, normal EDP   CATARACT EXTRACTION W/ INTRAOCULAR LENS IMPLANT  ~ 2010   right   CATARACT EXTRACTION W/PHACO Left 11/21/2016   Procedure: CATARACT EXTRACTION PHACO AND INTRAOCULAR LENS PLACEMENT (IOC) CDE - 11.70 ;  Surgeon: Tonny Branch, MD;  Location: [facility] ORS;  Service: Ophthalmology;  Laterality: Left;  left   cold cup removal bladder lesion  11/2005   "malignant"   COLONOSCOPY N/A 12/09/2014   Procedure: COLONOSCOPY;  Surgeon: Rogene [place], MD;  Location: [facility] ENDO SUITE;  Service: Endoscopy;  Laterality: N/A;  955 - moved to 8:30 - Ann to notify pt   CORONARY ANGIOPLASTY WITH STENT PLACEMENT  11/2007   Mid RCA - Driver BMS 3.5 mm x 15 mm; 2.0 mm Cutting Balloon PTCA - ostial D1   CORONARY ANGIOPLASTY WITH STENT PLACEMENT  08/07/11   Abnormal TM Myoview - for exertional throat discomfort: Patent RCA stent, 90% circumflex -- Vision BMS 3.5 mm x 12 mm --> 4.2 mm. Ostial D2 95% -- 2.25 mm Cutting Balloon PTCA   DOPPLER ECHOCARDIOGRAPHY  May 2009   Normal EF, impaired relaxation, with sclerotic aortic valve. No stenosis.   INGUINAL HERNIA REPAIR  ~ 1970   left   KNEE ARTHROSCOPY  02/20/11   left   LEFT HEART CATHETERIZATION WITH CORONARY ANGIOGRAM N/A 08/07/2011   Procedure: LEFT HEART CATHETERIZATION WITH CORONARY ANGIOGRAM;  Surgeon: Leonie Man, MD;  Location: [facility] CATH LAB;  Service: Cardiovascular;  Laterality: N/A;   LEFT HEART CATHETERIZATION WITH CORONARY ANGIOGRAM N/A 03/06/2012   Procedure: LEFT HEART CATHETERIZATION WITH CORONARY ANGIOGRAM;  Surgeon: Leonie Man,  MD;  Location: [facility] CATH LAB;  Service: Cardiovascular;  Laterality: N/A;   NM MYOVIEW LTD  Jan. 31, 2014   Low risk, EF 49%, improved from prior. Small apical and apical septal defect, fixed likely artifact no skin or infarction.   PARTIAL KNEE ARTHROPLASTY  06/2003   right   PERCUTANEOUS CORONARY STENT INTERVENTION (PCI-S)  08/07/2011   Procedure: PERCUTANEOUS CORONARY STENT INTERVENTION (PCI-S);  Surgeon: Leonie Man, MD;  Location: [facility] CATH LAB;  Service: Cardiovascular;;   TONSILLECTOMY     "as a child"   TOTAL KNEE ARTHROPLASTY  11/2003   right x2 and one on left- Total of 3.   TOTAL KNEE ARTHROPLASTY Left 09/28/2012   Procedure: TOTAL KNEE ARTHROPLASTY;  Surgeon: Gearlean Alf, MD;  Location: [facility] ORS;  Service: Orthopedics;  Laterality: Left;   . Current Outpatient Medications on File Prior to Visit  Medication Sig Dispense Refill   meloxicam (MOBIC) 7.5 MG tablet Take  7.5 mg by mouth daily as needed.     metoprolol succinate (TOPROL-XL) 25 MG 24 hr tablet Take 25 mg by mouth daily.     acetaminophen (TYLENOL) 650 MG CR tablet Take 650 mg by mouth every 8 (eight) hours as needed for pain.     amLODipine (NORVASC) 10 MG tablet TAKE 1 TABLET(10 MG) BY MOUTH DAILY (Patient taking differently: Take 10 mg by mouth daily. ) 90 tablet 1   clobetasol (TEMOVATE) 0.05 % external solution Apply 1 application topically daily.      clobetasol cream (TEMOVATE) 9.47 % Apply 1 application topically 2 (two) times daily.     Coenzyme Q10 (CO Q-10) 100 MG CAPS Take 300 capsules by mouth 3 (three) times daily. (Patient not taking: Reported on 03/30/2019) 90 each 11   cyclobenzaprine (FLEXERIL) 10 MG tablet Take 1 tablet (10 mg total) by mouth 2 (two) times daily as needed for muscle spasms. 20 tablet 0   EPINEPHrine 0.3 mg/0.3 mL IJ SOAJ injection Inject 0.3 mg into the muscle as needed for anaphylaxis.      ezetimibe (ZETIA) 10 MG tablet TAKE 1 TABLET(10 MG) BY MOUTH DAILY (Patient taking  differently: Take 10 mg by mouth daily. ) 90 tablet 3   Fluticasone Propionate (XHANCE) 93 MCG/ACT EXHU Place 2 application into the nose 2 (two) times a day. 32 mL 11   hydrochlorothiazide (HYDRODIURIL) 25 MG tablet TAKE 1 TABLET BY MOUTH DAILY (Patient taking differently: Take 25 mg by mouth daily. ) 90 tablet 0   HYDROcodone-acetaminophen (NORCO/VICODIN) 5-325 MG tablet Take 1 tablet by mouth every 6 (six) hours as needed for severe pain. (Patient not taking: Reported on 03/30/2019) 10 tablet 0   ibuprofen (ADVIL,MOTRIN) 600 MG tablet Take 1 tablet (600 mg total) by mouth every 6 (six) hours as needed. (Patient not taking: Reported on 03/30/2019) 30 tablet 0   isosorbide mononitrate (IMDUR) 60 MG 24 hr tablet TAKE 1 TABLET BY MOUTH DAILY (Patient not taking: Reported on 03/30/2019) 90 tablet 1   lisinopril (ZESTRIL) 40 MG tablet Take 0.5 tablets (20 mg total) by mouth daily. 90 tablet 3   loperamide (IMODIUM) 2 MG capsule Take 2 mg by mouth every 6 (six) hours as needed for diarrhea or loose stools.     loperamide (IMODIUM) 2 MG capsule Take 1 capsule (2 mg total) by mouth 4 (four) times daily as needed for diarrhea or loose stools. 12 capsule 0   metFORMIN (GLUCOPHAGE) 500 MG tablet Take 500 mg by mouth daily with breakfast.      Multiple Vitamins-Minerals (PRESERVISION AREDS PO) Take 1 capsule by mouth daily.      nystatin-triamcinolone (MYCOLOG II) cream Apply 1 application topically 2 (two) times daily as needed (dry skin).      Omega-3 Fatty Acids (FISH OIL) 1000 MG CAPS Take 3 capsules (3,000 mg total) by mouth daily. (Patient not taking: Reported on 03/30/2019) 90 capsule 11   simvastatin (ZOCOR) 10 MG tablet TAKE 1 TABLET BY MOUTH DAILY AT 6 PM (Patient taking differently: Take 10 mg by mouth daily at 6 PM. ) 90 tablet 0   tamsulosin (FLOMAX) 0.4 MG CAPS capsule Take 0.4 mg by mouth daily as needed (prostate).      triazolam (HALCION) 0.125 MG tablet Take 1.5 tablets (0.1875 mg  total) by mouth at bedtime. Pt takes 1.5 tabs at bedtime (Patient taking differently: Take 0.25 mg by mouth at bedtime. ) 30 tablet 5   No current facility-administered medications on  file prior to visit.    Allergies  Allergen Reactions   Rosuvastatin Calcium Other (See Comments)    Myalgias, tired crestor -aches    Review of Systems  See HPI, all other systems reviewed and are negative      Objective:   Physical Exam BP 131/69    Pulse (!) 56    Temp 98.2 F (36.8 C)    Ht 6' (1.829 m)    Wt 248 lb 12.8 oz (112.9 kg)    BMI 33.74 kg/m   General: 78 year old male in no acute distress Eyes: Sclera clear, conjunctiva pink Mouth: Upper dentures Neck: Supple, no lymphadenopathy or thyromegaly Heart: Regular rate and rhythm, no murmurs Lungs: Breath sounds clear throughout Abdomen: Soft, nontender, no masses or organomegaly Extremities: No edema Neuro: Alert and oriented x4, no focal deficits    Assessment & Plan:   1.  Right colon Colitis.  Abdominal/pelvic CT without contrast 03/29/2019 identified mild circumferential wall thickening to the proximal colon consistent with colitis and enlarged appendix measuring 10 cm with equivocal mild adjacent inflammation.  Patient was seen by general surgery who advised observation, surgery deferred. Patient left [facility] AMA 8/31. He went to [facility] ER 9/1 with persistent diarrhea and right lower back pain, labs and exam were stable and patient was discharged home. Diarrhea resolved after taking Loperamide.  -Schedule a diagnostic colonoscopy in 6 to 8 weeks, colonoscopy benefits and risks discussed with patient including risk with sedation, risk of bleeding, perforation and infection -Phillips bacteria probiotic once daily -Patient call office if diarrhea recurs  2. Hx of colon polyps  3. Hx of CAD, MI, Stent x 2, PTCA not taking ASA

## 2019-04-06 NOTE — Patient Instructions (Signed)
1. Schedule a colonoscopy in 6 to 8 weeks   2. Phillip's bacteria probiotic one capsule by mouth once daily for 2 weeks   3. Call our office if your diarrhea recurs

## 2019-04-08 ENCOUNTER — Telehealth (INDEPENDENT_AMBULATORY_CARE_PROVIDER_SITE_OTHER): Payer: Self-pay | Admitting: *Deleted

## 2019-04-08 ENCOUNTER — Encounter (INDEPENDENT_AMBULATORY_CARE_PROVIDER_SITE_OTHER): Payer: Self-pay | Admitting: *Deleted

## 2019-04-08 DIAGNOSIS — K529 Noninfective gastroenteritis and colitis, unspecified: Secondary | ICD-10-CM

## 2019-04-08 DIAGNOSIS — R197 Diarrhea, unspecified: Secondary | ICD-10-CM

## 2019-04-08 MED ORDER — PEG 3350-KCL-NA BICARB-NACL 420 G PO SOLR: 4000.0000 mL | Freq: Once | ORAL | 0 refills | Status: AC

## 2019-04-08 NOTE — Telephone Encounter (Signed)
Patient needs trilyte TCS sch'd 10/30

## 2019-04-09 DIAGNOSIS — H353221 Exudative age-related macular degeneration, left eye, with active choroidal neovascularization: Secondary | ICD-10-CM | POA: Diagnosis not present

## 2019-04-09 DIAGNOSIS — H43813 Vitreous degeneration, bilateral: Secondary | ICD-10-CM | POA: Diagnosis not present

## 2019-04-09 DIAGNOSIS — H353213 Exudative age-related macular degeneration, right eye, with inactive scar: Secondary | ICD-10-CM | POA: Diagnosis not present

## 2019-04-09 DIAGNOSIS — H31091 Other chorioretinal scars, right eye: Secondary | ICD-10-CM | POA: Diagnosis not present

## 2019-04-09 NOTE — Progress Notes (Signed)
Arthur Lutz has been approved. Patient is aware. I am sending the confirmation to the pharmacy and to be scanned to chart for future reference.

## 2019-04-15 ENCOUNTER — Ambulatory Visit (INDEPENDENT_AMBULATORY_CARE_PROVIDER_SITE_OTHER): Payer: Medicare Other | Admitting: Nurse Practitioner

## 2019-04-16 ENCOUNTER — Other Ambulatory Visit: Payer: Self-pay | Admitting: Cardiology

## 2019-04-19 DIAGNOSIS — E1151 Type 2 diabetes mellitus with diabetic peripheral angiopathy without gangrene: Secondary | ICD-10-CM | POA: Diagnosis not present

## 2019-04-19 DIAGNOSIS — E1140 Type 2 diabetes mellitus with diabetic neuropathy, unspecified: Secondary | ICD-10-CM | POA: Diagnosis not present

## 2019-04-23 ENCOUNTER — Ambulatory Visit (INDEPENDENT_AMBULATORY_CARE_PROVIDER_SITE_OTHER): Payer: Medicare Other | Admitting: Cardiology

## 2019-04-23 ENCOUNTER — Encounter: Payer: Self-pay | Admitting: Cardiology

## 2019-04-23 ENCOUNTER — Other Ambulatory Visit: Payer: Self-pay

## 2019-04-23 VITALS — BP 137/66 | HR 54 | Temp 97.1°F | Ht 72.0 in | Wt 247.8 lb

## 2019-04-23 DIAGNOSIS — Z9861 Coronary angioplasty status: Secondary | ICD-10-CM

## 2019-04-23 DIAGNOSIS — R001 Bradycardia, unspecified: Secondary | ICD-10-CM | POA: Diagnosis not present

## 2019-04-23 DIAGNOSIS — L409 Psoriasis, unspecified: Secondary | ICD-10-CM | POA: Insufficient documentation

## 2019-04-23 DIAGNOSIS — I10 Essential (primary) hypertension: Secondary | ICD-10-CM

## 2019-04-23 DIAGNOSIS — I2510 Atherosclerotic heart disease of native coronary artery without angina pectoris: Secondary | ICD-10-CM | POA: Diagnosis not present

## 2019-04-23 DIAGNOSIS — E785 Hyperlipidemia, unspecified: Secondary | ICD-10-CM

## 2019-04-23 DIAGNOSIS — I208 Other forms of angina pectoris: Secondary | ICD-10-CM

## 2019-04-23 DIAGNOSIS — L4050 Arthropathic psoriasis, unspecified: Secondary | ICD-10-CM

## 2019-04-23 MED ORDER — PREDNISONE 10 MG (21) PO TBPK: ORAL_TABLET | ORAL | 3 refills | Status: DC

## 2019-04-23 MED ORDER — METOPROLOL SUCCINATE ER 25 MG PO TB24: 12.5000 mg | ORAL_TABLET | Freq: Every day | ORAL | 3 refills | Status: DC

## 2019-04-23 NOTE — Assessment & Plan Note (Addendum)
Symptomatic now with HR 45 on EKG (noticing some exercise intolerance)--> will cut Toprol to 1/2 tab (low threshold for stopping)

## 2019-04-23 NOTE — Assessment & Plan Note (Signed)
Having Flare -- to help with walking - will Rx Prednisone Dose Pack.  Recommend d/w further with PCP.

## 2019-04-23 NOTE — Patient Instructions (Addendum)
Medication Instructions:  MAY CONTINUE   TO STAY OFF HCTZ DECREASE TOPROL TO 1/2 tab daily If you need a refill on your cardiac medications before your next appointment, please call your pharmacy.   Lab work: LABS IN NOV 2021- LIPID,CBC, TSH, CMP - FASTING WILL SEND LABSLIP IN OCT 2020  If you have labs (blood work) drawn today and your tests are completely normal, you will receive your results only by: Marland Kitchen MyChart Message (if you have MyChart) OR . A paper copy in the mail If you have any lab test that is abnormal or we need to change your treatment, we will call you to review the results.  Testing/Procedures:  NOT NEEDED  Follow-Up: At [facility], you and your health needs are our priority.  As part of our continuing mission to provide you with exceptional heart care, we have created designated Provider Care Teams.  These Care Teams include your primary Cardiologist (physician) and Advanced Practice Providers (APPs -  Physician Assistants and Nurse Practitioners) who all work together to provide you with the care you need, when you need it. . You will need a follow up appointment in  12  Months- SEPT 2021.  Please call our office 2 months in advance to schedule this appointment.  You may see Glenetta Hew, MD or one of the following Advanced Practice Providers on your designated Care Team:   . Rosaria Ferries, PA-C . Jory Sims, DNP, ANP  Any Other Special Instructions Will Be Listed Below (If Applicable).

## 2019-04-23 NOTE — Progress Notes (Signed)
PCP: Arthur School, MD  Clinic Note: Chief Complaint  Patient presents with  . Follow-up    Delayed due to COVID-19 not down  . Coronary Artery Disease    HPI: Arthur Lutz is a 78 y.o. male with a PMH below who presents today for 72-month follow-up f/u for CAD-PCI.  He is a former patient of Dr. Chase Lutz, who's CAD history dates back to 2009 had a "mild heart attack".   2009: He underwent PCI of the RCA & balloon PTCA to the ostium of the first diagonal Lutz.   In January 2013 he then had PCI of the Circumflex and Cutting Balloon PTCA of the a diagonal Lutz.   These sites were all patent in August 2013 repeat cardiac catheterization.   He had a negative Myoview in February 2014 as part of preoperative risk assessment.   Arthur Lutz was last seen in February 2019, doing well.  This is a 2-year follow-up.  Occasional off-and-on chest tightness and exertional dyspnea if he overexerts.  If heart rate goes up too far if he stresses himself out with other symptoms.  Otherwise doing okay.  Feels better with rest.With routine activities including climbing stairs without carrying something, he is doing fine.  He has not had to use any nitroglycerin.  More often he is limited by his knee pain, and thinks that that may be the reason why he has gained weight and is more deconditioned.  CVRR for Lipids   Recent Hospitalizations: n/a  Studies Reviewed: n/a  Interval History: Arthur Lutz returns today for delayed annual follow-up 2/2 COVID.   ~ 1 month ago - had 14 d straight diarrhea -> dehydrated.  Now regaining strength. Notes HR is slow -- & with that notes more DOE.  Otherwise no resting dyspnea No CP at rest or exertion. Can walk up a hill without difficulty - just "out of shape"  Mostly troubled due to arthritis pains.  Hands & elbows hurt him a lot.  Still staying active though. SOB with increased exertion b/c deconditioning obesity. Mild swelling in lower legs (more  when knees flare up).  He tells me that he stopped taking aspirin over a year ago.  He was having a lot of abdominal issues while taking aspirin and has not been on anything such as aspirin Plavix since --> apparently this was stopped because of his macular degeneration degeneration and getting shots.  He was having bleeding in his retina on these medications.    Also indicates that somewhere along the line his losartan-HCTZ was converted to lisinopril 40 mg daily.  He has not been taking HCTZ since that change.  Cardiovascular Review of Symptoms: positive for - dyspnea on exertion and Really only if he overexerts himself, mostly because of deconditioning.  Also some mild positional dizziness. negative for - chest pain, edema, irregular heartbeat, orthopnea, palpitations, paroxysmal nocturnal dyspnea, rapid heart rate, shortness of breath or Syncope/near syncope, TIA/amaurosis fugax; does not walk enough to no claudication.    ROS: A comprehensive was performed. Review of Systems  Constitutional: Negative for diaphoresis, fever and malaise/fatigue.  HENT: Negative for congestion and nosebleeds.   Eyes:       Currently still getting shots for his macular degeneration.  Respiratory: Negative for cough, shortness of breath and wheezing.   Cardiovascular: Negative for claudication.       Noting slow HR  Gastrointestinal: Negative for abdominal pain, blood in stool, heartburn and melena.  No more diarrhea after ~2 weeks (now 2 weeks without) -- no F/C/N/V  Genitourinary: Negative for hematuria.  Musculoskeletal: Positive for joint pain (Usual knee and ankle pain.).       Hands, elbows, neck & back pain -- having arthritis flare with wet weather.  Neurological: Positive for tingling (He does have some peripheral neuropathy in his feet. He is very careful with his treatment of toenails and watching for injury.). Negative for dizziness and headaches.  Endo/Heme/Allergies: Does not bruise/bleed  easily.  Psychiatric/Behavioral: Negative for depression and memory loss. The patient is not nervous/anxious and does not have insomnia.   All other systems reviewed and are negative.  The patient does not have symptoms concerning for COVID-19 infection (fever, chills, cough, or new shortness of breath).  The patient is practicing social distancing.   Past Medical History:  Diagnosis Date  . Allergic rhinitis   . Aortic valve sclerosis    Cause of murmur  . Arthritis of knee, left, needs total Knee in future with Dr. Wynelle Lutz  08/07/2011  . Bladder cancer ([place]) 11/2005  . CAD S/P percutaneous coronary angioplasty 08/07/2011   NEW 08/07/11 cutting balloon PTCA to 95% ostial diag. and 90%  proximal LCX.  prior cutting balloon and to RCA  & ostium of ostial diag; Last Cath 02/2012 - Patent Diag PTCA, RCA & Circ BMS stents. Normal EF & EDP; Myoview 2/14 no evidence of ischemia or infarction  . Diabetes mellitus type 2, controlled, with complications ([place])   . Diabetes mellitus without complication ([place])   . Dyslipidemia   . History of heart attack 11/2007   "mild; did not affect the heart muscle"; PCI to RCA, Cutting PTCA Diag ostium  . Hypertension   . Macular degeneration   . Macular degeneration of right eye    Now getting shots.   . Melanoma of skin ([place]) ~ 2005   "level 2; on my back"  . Myocardial infarction ([place]) 2012  . Osteoarthritis    Status post right knee arthroplasty, positive for staph infection. Pending right arthroplasty  . Portacath in place 08/11/2012  . Statin intolerance 08/07/2011  . Stroke [facility]) 1980's   "had facial strokes; 2; about a year apart; never did find what caused them"    Past Surgical History:  Procedure Laterality Date  . CARDIAC CATHETERIZATION  August '13   Widely patent RCA and LCx stents. Also patent PTCA site to D1, ~40-50% D2. Normal EF, normal EDP  . CATARACT EXTRACTION W/ INTRAOCULAR LENS IMPLANT  ~ 2010   right  . CATARACT EXTRACTION W/PHACO Left  11/21/2016   Procedure: CATARACT EXTRACTION PHACO AND INTRAOCULAR LENS PLACEMENT (IOC) CDE - 11.70 ;  Surgeon: Arthur Branch, MD;  Location: [facility] ORS;  Service: Ophthalmology;  Laterality: Left;  left  . cold cup removal bladder lesion  11/2005   "malignant"  . COLONOSCOPY N/A 12/09/2014   Procedure: COLONOSCOPY;  Surgeon: Rogene Houston, MD;  Location: [facility] ENDO SUITE;  Service: Endoscopy;  Laterality: N/A;  955 - moved to 8:30 - Ann to notify pt  . CORONARY ANGIOPLASTY WITH STENT PLACEMENT  11/2007   Mid RCA - Driver BMS 3.5 mm x 15 mm; 2.0 mm Cutting Balloon PTCA - ostial D1  . CORONARY ANGIOPLASTY WITH STENT PLACEMENT  08/07/11   Abnormal TM Myoview - for exertional throat discomfort: Patent RCA stent, 90% circumflex -- Vision BMS 3.5 mm x 12 mm --> 4.2 mm. Ostial D2 95% -- 2.25 mm Cutting Balloon PTCA  .  DOPPLER ECHOCARDIOGRAPHY  May 2009   Normal EF, impaired relaxation, with sclerotic aortic valve. No stenosis.  . INGUINAL HERNIA REPAIR  ~ 1970   left  . KNEE ARTHROSCOPY  02/20/11   left  . LEFT HEART CATHETERIZATION WITH CORONARY ANGIOGRAM N/A 08/07/2011   Procedure: LEFT HEART CATHETERIZATION WITH CORONARY ANGIOGRAM;  Surgeon: Leonie Man, MD;  Location: [facility] CATH LAB;  Service: Cardiovascular;  Laterality: N/A;  . LEFT HEART CATHETERIZATION WITH CORONARY ANGIOGRAM N/A 03/06/2012   Procedure: LEFT HEART CATHETERIZATION WITH CORONARY ANGIOGRAM;  Surgeon: Leonie Man, MD;  Location: [facility] CATH LAB;  Service: Cardiovascular;  Laterality: N/A;  . NM MYOVIEW LTD  Jan. 31, 2014   Low risk, EF 49%, improved from prior. Small apical and apical septal defect, fixed likely artifact no skin or infarction.  Marland Kitchen PARTIAL KNEE ARTHROPLASTY  06/2003   right  . PERCUTANEOUS CORONARY STENT INTERVENTION (PCI-S)  08/07/2011   Procedure: PERCUTANEOUS CORONARY STENT INTERVENTION (PCI-S);  Surgeon: Leonie Man, MD;  Location: [facility] CATH LAB;  Service: Cardiovascular;;  . TONSILLECTOMY     "as a child"  . TOTAL KNEE  ARTHROPLASTY  11/2003   right x2 and one on left- Total of 3.  . TOTAL KNEE ARTHROPLASTY Left 09/28/2012   Procedure: TOTAL KNEE ARTHROPLASTY;  Surgeon: Gearlean Alf, MD;  Location: [facility] ORS;  Service: Orthopedics;  Laterality: Left;    Current Outpatient Medications on File Prior to Visit  Medication Sig Dispense Refill  . acetaminophen (TYLENOL) 650 MG CR tablet Take 650 mg by mouth every 8 (eight) hours as needed for pain.    Marland Kitchen amLODipine (NORVASC) 10 MG tablet TAKE 1 TABLET(10 MG) BY MOUTH DAILY (Patient taking differently: Take 10 mg by mouth daily. ) 90 tablet 1  . clobetasol (TEMOVATE) 0.05 % external solution Apply 1 application topically daily.     . clobetasol cream (TEMOVATE) [id number] % Apply 1 application topically 2 (two) times daily.    . Coenzyme Q10 (CO Q-10) 100 MG CAPS Take 300 capsules by mouth 3 (three) times daily. 90 each 11  . cyclobenzaprine (FLEXERIL) 10 MG tablet Take 1 tablet (10 mg total) by mouth 2 (two) times daily as needed for muscle spasms. 20 tablet 0  . EPINEPHrine 0.3 mg/0.3 mL IJ SOAJ injection Inject 0.3 mg into the muscle as needed for anaphylaxis.     Marland Kitchen ezetimibe (ZETIA) 10 MG tablet TAKE 1 TABLET(10 MG) BY MOUTH DAILY (Patient taking differently: Take 10 mg by mouth daily. ) 90 tablet 3  . Fluticasone Propionate (XHANCE) 93 MCG/ACT [place] 2 application into the nose 2 (two) times a day. 32 mL 11  . HYDROcodone-acetaminophen (NORCO/VICODIN) 5-325 MG tablet Take 1 tablet by mouth every 6 (six) hours as needed for severe pain. 10 tablet 0  . ibuprofen (ADVIL,MOTRIN) 600 MG tablet Take 1 tablet (600 mg total) by mouth every 6 (six) hours as needed. 30 tablet 0  . isosorbide mononitrate (IMDUR) 60 MG 24 hr tablet TAKE 1 TABLET BY MOUTH DAILY 90 tablet 1  . lisinopril (ZESTRIL) 40 MG tablet Take 0.5 tablets (20 mg total) by mouth daily. 90 tablet 3  . loperamide (IMODIUM) 2 MG capsule Take 1 capsule (2 mg total) by mouth 4 (four) times daily as needed for  diarrhea or loose stools. 12 capsule 0  . meloxicam (MOBIC) 7.5 MG tablet Take 7.5 mg by mouth daily as needed.    . metFORMIN (GLUCOPHAGE) 500 MG tablet Take 500 mg  by mouth daily with breakfast.     . Multiple Vitamins-Minerals (PRESERVISION AREDS PO) Take 1 capsule by mouth daily.     Marland Kitchen nystatin-triamcinolone (MYCOLOG II) cream Apply 1 application topically 2 (two) times daily as needed (dry skin).     . Omega-3 Fatty Acids (FISH OIL) 1000 MG CAPS Take 3 capsules (3,000 mg total) by mouth daily. 90 capsule 11  . simvastatin (ZOCOR) 10 MG tablet TAKE 1 TABLET BY MOUTH DAILY AT 6 PM 90 tablet 2  . tamsulosin (FLOMAX) 0.4 MG CAPS capsule Take 0.4 mg by mouth daily as needed (prostate).     . triazolam (HALCION) 0.125 MG tablet Take 1.5 tablets (0.1875 mg total) by mouth at bedtime. Pt takes 1.5 tabs at bedtime (Patient taking differently: Take 0.25 mg by mouth at bedtime. ) 30 tablet 5   No current facility-administered medications on file prior to visit.   -- GETS INTERMITTENT SHOTS FOR MACULAR DEGENERATION  Allergies  Allergen Reactions  . Rosuvastatin Calcium Other (See Comments)    Myalgias, tired crestor -aches   Social History   Tobacco Use  . Smoking status: Former Smoker    Packs/day: 2.00    Years: 28.00    Pack years: 56.00    Types: Cigarettes    Quit date: 07/29/1980    Years since quitting: 38.7  . Smokeless tobacco: Never Used  Substance Use Topics  . Alcohol use: Yes    Alcohol/week: 2.0 standard drinks    Types: 1 Glasses of wine, 1 Standard drinks or equivalent per week    Comment:  "wine or whiskey" occasionally  . Drug use: No     Social History   Social History Narrative   He is a married father of 2, grandfather of 33. He is not really getting a lot of exercise now. He previously had been working out on a treadmill at least 2 times a day for 15 minutes at a time, but he   is not able to do that as much now because his knee hurts him too bad. Does not smoke  and only occasional alcoholic beverage.    family history includes Emphysema in his father; Lung cancer in his paternal uncle.   Wt Readings from Last 3 Encounters:  04/23/19 247 lb 12.8 oz (112.4 kg)  04/06/19 248 lb 12.8 oz (112.9 kg)  03/26/19 258 lb 9.6 oz (117.3 kg)    PHYSICAL EXAM BP 137/66   Pulse (!) 54   Temp (!) 97.1 F (36.2 C)   Ht 6' (1.829 m)   Wt 247 lb 12.8 oz (112.4 kg)   SpO2 91%   BMI 33.61 kg/m   Physical Exam  Constitutional: He is oriented to person, place, and time. He appears well-developed and well-nourished. No distress.  Well-groomed.  Obese  HENT:  Head: Normocephalic and atraumatic.  Mouth/Throat: No oropharyngeal exudate.  Neck: No hepatojugular reflux and no JVD present. Carotid bruit is not present.  Cardiovascular: Normal rate, regular rhythm and normal pulses.  No extrasystoles are present. PMI is not displaced. Exam reveals no gallop and no friction rub.  Murmur heard.  Medium-pitched harsh crescendo-decrescendo early systolic murmur is present with a grade of 1/6 at the upper right sternal border radiating to the neck. Pulmonary/Chest: Effort normal and breath sounds normal. No respiratory distress.  Abdominal: Soft. Bowel sounds are normal. He exhibits no distension. There is no abdominal tenderness.  Protuberant, obese abdomen; no HSM  Musculoskeletal: Normal range of motion.  General: Edema (Trivial with mild venous stasis dermatitis changes.) present.  Neurological: He is alert and oriented to person, place, and time.  Skin: Skin is warm and dry. No erythema.  Psychiatric: He has a normal mood and affect. His behavior is normal. Judgment and thought content normal.  Nursing note and vitals reviewed.   Adult ECG Report  Rate: 45;  Rhythm: Sinus bradycardia, borderline incomplete right bundle Lutz block.  nonspecific ST-T wave change. narrative Interpretation: Stable EKG otherwise   Other studies Reviewed: Additional  studies/ records that were reviewed today include:  Recent Labs -- last in Nov 2019 (due this Nov)   ASSESSMENT / PLAN: Problem List Items Addressed This Visit    CAD S/P percutaneous coronary angioplasty: 08/07/11 cutting balloon PTCA to 95% ostial diag. and 90%  proximal LCX.  prior cutting balloon to RCA  & ostium of ostial diag., 2009  - Primary (Chronic)    Overall pretty stable from a cardiac standpoint.  No active angina or heart failure symptoms. He actually did not wean down to 12.5 mg Toprol as we had planned.   He is also on now Imdur plus amlodipine for antianginal benefit. On lisinopril at high dose, but no longer on the HCTZ. Not taking aspirin and/or Plavix because of retinal bleeding with macular degeneration.->  Provided he has no further symptoms, I think were okay holding off on treating. Continue simvastatin and co-Q10.      Relevant Medications   metoprolol succinate (TOPROL XL) 25 MG 24 hr tablet   Other Relevant Orders   EKG 12-Lead   Lipid panel   Comprehensive metabolic panel   CBC   TSH   Essential hypertension (Chronic)    Blood pressure excellent pretty good little bit little bit borderline.  He is on highest dose possible lisinopril and amlodipine.  Has not been taking HCTZ which I think could hold off and monitor. Plan is to reduce Toprol to one half dose because of his bradycardia.      Relevant Medications   metoprolol succinate (TOPROL XL) 25 MG 24 hr tablet   Other Relevant Orders   Lipid panel   Comprehensive metabolic panel   CBC   TSH   Hyperlipemia, intolerant to statins. Goal LDL < 70 (Chronic)    Seems to be tolerating simvastatin relatively well.  LDL was pretty close to goal at 65 last year.  Due to have follow-up labs checked in November.      Relevant Medications   metoprolol succinate (TOPROL XL) 25 MG 24 hr tablet   Other Relevant Orders   Lipid panel   Comprehensive metabolic panel   CBC   TSH   Chronic stable angina (HCC) -  negative Myoview & no new Dz on Cath (Chronic)    Well-controlled now had a negative Myoview and no significant disease on cardiac catheterization a few years ago.  Is on Imdur and low-dose Toprol which we are reducing further.  Is also on amlodipine.      Relevant Medications   metoprolol succinate (TOPROL XL) 25 MG 24 hr tablet   Other Relevant Orders   EKG 12-Lead   Lipid panel   Comprehensive metabolic panel   CBC   TSH   Sinus bradycardia (Chronic)    Symptomatic now with HR 45 on EKG (noticing some exercise intolerance)--> will cut Toprol to 1/2 tab (low threshold for stopping)       Relevant Medications   metoprolol succinate (TOPROL XL) 25 MG 24  hr tablet   Other Relevant Orders   EKG 12-Lead   Lipid panel   Comprehensive metabolic panel   CBC   TSH   Psoriatic arthritis (HCC) (Chronic)    Having Flare -- to help with walking - will Rx Prednisone Dose Pack.  Recommend d/w further with PCP.      Relevant Medications   predniSONE (STERAPRED UNI-PAK 21 TAB) 10 MG (21) TBPK tablet   Other Relevant Orders   Lipid panel   Comprehensive metabolic panel   CBC   TSH     Checking labs other than lipid panel and chemistry panel such as CBC and TSH in order to consolidate his annual labs.  COVID-19 Education: The signs and symptoms of COVID-19 were discussed with the patient and how to seek care for testing (follow up with PCP or arrange E-visit).   The importance of social distancing was discussed today.  Current medicines are reviewed at length with the patient today. (+/- concerns) None  The following changes have been made: None Patient Instructions  Medication Instructions:  MAY CONTINUE   TO STAY OFF HCTZ DECREASE TOPROL TO 1/2 tab daily If you need a refill on your cardiac medications before your next appointment, please call your pharmacy.   Lab work: LABS IN NOV 2021- LIPID,CBC, TSH, CMP - FASTING WILL SEND LABSLIP IN OCT 2020  If you have labs (blood  work) drawn today and your tests are completely normal, you will receive your results only by: Marland Kitchen MyChart Message (if you have MyChart) OR . A paper copy in the mail If you have any lab test that is abnormal or we need to change your treatment, we will call you to review the results.  Testing/Procedures:  NOT NEEDED  Follow-Up: At [facility], you and your health needs are our priority.  As part of our continuing mission to provide you with exceptional heart care, we have created designated Provider Care Teams.  These Care Teams include your primary Cardiologist (physician) and Advanced Practice Providers (APPs -  Physician Assistants and Nurse Practitioners) who all work together to provide you with the care you need, when you need it. . You will need a follow up appointment in  12  Months- SEPT 2021.  Please call our office 2 months in advance to schedule this appointment.  You may see Glenetta Hew, MD or one of the following Advanced Practice Providers on your designated Care Team:   . Rosaria Ferries, PA-C . Jory Sims, DNP, ANP  Any Other Special Instructions Will Be Listed Below (If Applicable). Studies Ordered:   Orders Placed This Encounter  Procedures  . Lipid panel  . Comprehensive metabolic panel  . CBC  . TSH  . EKG 12-Lead      Glenetta Hew, M.D., M.S. Interventional Cardiologist    Pager # [phone] Phone # [phone] [address] [place], [place] [address]

## 2019-04-25 ENCOUNTER — Encounter: Payer: Self-pay | Admitting: Cardiology

## 2019-04-25 NOTE — Assessment & Plan Note (Signed)
Blood pressure excellent pretty good little bit little bit borderline.  He is on highest dose possible lisinopril and amlodipine.  Has not been taking HCTZ which I think could hold off and monitor. Plan is to reduce Toprol to one half dose because of his bradycardia.

## 2019-04-25 NOTE — Assessment & Plan Note (Addendum)
Well-controlled now had a negative Myoview and no significant disease on cardiac catheterization a few years ago.  Is on Imdur and low-dose Toprol which we are reducing further.  Is also on amlodipine.

## 2019-04-25 NOTE — Assessment & Plan Note (Signed)
Overall pretty stable from a cardiac standpoint.  No active angina or heart failure symptoms. He actually did not wean down to 12.5 mg Toprol as we had planned.   He is also on now Imdur plus amlodipine for antianginal benefit. On lisinopril at high dose, but no longer on the HCTZ. Not taking aspirin and/or Plavix because of retinal bleeding with macular degeneration.->  Provided he has no further symptoms, I think were okay holding off on treating. Continue simvastatin and co-Q10.

## 2019-04-25 NOTE — Assessment & Plan Note (Signed)
Seems to be tolerating simvastatin relatively well.  LDL was pretty close to goal at 65 last year.  Due to have follow-up labs checked in November.

## 2019-05-07 ENCOUNTER — Ambulatory Visit (INDEPENDENT_AMBULATORY_CARE_PROVIDER_SITE_OTHER): Payer: Medicare Other

## 2019-05-07 DIAGNOSIS — J309 Allergic rhinitis, unspecified: Secondary | ICD-10-CM | POA: Diagnosis not present

## 2019-05-14 ENCOUNTER — Ambulatory Visit (INDEPENDENT_AMBULATORY_CARE_PROVIDER_SITE_OTHER): Payer: Medicare Other

## 2019-05-14 DIAGNOSIS — J309 Allergic rhinitis, unspecified: Secondary | ICD-10-CM | POA: Diagnosis not present

## 2019-05-16 ENCOUNTER — Other Ambulatory Visit: Payer: Self-pay | Admitting: Cardiology

## 2019-05-19 ENCOUNTER — Other Ambulatory Visit: Payer: Self-pay | Admitting: *Deleted

## 2019-05-19 ENCOUNTER — Ambulatory Visit (INDEPENDENT_AMBULATORY_CARE_PROVIDER_SITE_OTHER): Payer: Medicare Other

## 2019-05-19 DIAGNOSIS — J309 Allergic rhinitis, unspecified: Secondary | ICD-10-CM

## 2019-05-19 DIAGNOSIS — Z20822 Contact with and (suspected) exposure to covid-19: Secondary | ICD-10-CM

## 2019-05-20 LAB — NOVEL CORONAVIRUS, NAA: SARS-CoV-2, NAA: NOT DETECTED

## 2019-05-25 ENCOUNTER — Encounter (HOSPITAL_COMMUNITY): Payer: Self-pay

## 2019-05-25 ENCOUNTER — Other Ambulatory Visit: Payer: Self-pay

## 2019-05-26 ENCOUNTER — Encounter (HOSPITAL_COMMUNITY)
Admission: RE | Admit: 2019-05-26 | Discharge: 2019-05-26 | Disposition: A | Discharge status: Home / Self Care | Payer: Medicare Other | Source: Ambulatory Visit | Attending: Internal Medicine | Admitting: Internal Medicine

## 2019-05-26 ENCOUNTER — Other Ambulatory Visit (HOSPITAL_COMMUNITY)
Admission: RE | Admit: 2019-05-26 | Discharge: 2019-05-26 | Disposition: A | Discharge status: Home / Self Care | Payer: Medicare Other | Source: Ambulatory Visit | Attending: Internal Medicine | Admitting: Internal Medicine

## 2019-05-26 DIAGNOSIS — Z01812 Encounter for preprocedural laboratory examination: Secondary | ICD-10-CM | POA: Diagnosis not present

## 2019-05-26 DIAGNOSIS — Z20828 Contact with and (suspected) exposure to other viral communicable diseases: Secondary | ICD-10-CM | POA: Insufficient documentation

## 2019-05-26 LAB — SARS CORONAVIRUS 2 (TAT 6-24 HRS): SARS Coronavirus 2: NEGATIVE

## 2019-05-28 ENCOUNTER — Encounter (HOSPITAL_COMMUNITY): Payer: Self-pay

## 2019-05-28 ENCOUNTER — Ambulatory Visit (HOSPITAL_COMMUNITY): Payer: Medicare Other | Admitting: Anesthesiology

## 2019-05-28 ENCOUNTER — Ambulatory Visit (HOSPITAL_COMMUNITY)
Admission: RE | Admit: 2019-05-28 | Discharge: 2019-05-28 | Disposition: A | Discharge status: Home / Self Care | Payer: Medicare Other | Attending: Internal Medicine | Admitting: Internal Medicine

## 2019-05-28 ENCOUNTER — Encounter (HOSPITAL_COMMUNITY)
Admission: RE | Disposition: A | Discharge status: Home / Self Care | Payer: Self-pay | Source: Home / Self Care | Attending: Internal Medicine

## 2019-05-28 ENCOUNTER — Other Ambulatory Visit: Payer: Self-pay

## 2019-05-28 DIAGNOSIS — K6389 Other specified diseases of intestine: Secondary | ICD-10-CM | POA: Diagnosis not present

## 2019-05-28 DIAGNOSIS — I10 Essential (primary) hypertension: Secondary | ICD-10-CM | POA: Insufficient documentation

## 2019-05-28 DIAGNOSIS — Z7984 Long term (current) use of oral hypoglycemic drugs: Secondary | ICD-10-CM | POA: Diagnosis not present

## 2019-05-28 DIAGNOSIS — Z85820 Personal history of malignant melanoma of skin: Secondary | ICD-10-CM | POA: Insufficient documentation

## 2019-05-28 DIAGNOSIS — Z955 Presence of coronary angioplasty implant and graft: Secondary | ICD-10-CM | POA: Insufficient documentation

## 2019-05-28 DIAGNOSIS — Z8551 Personal history of malignant neoplasm of bladder: Secondary | ICD-10-CM | POA: Diagnosis not present

## 2019-05-28 DIAGNOSIS — Q438 Other specified congenital malformations of intestine: Secondary | ICD-10-CM | POA: Diagnosis not present

## 2019-05-28 DIAGNOSIS — I252 Old myocardial infarction: Secondary | ICD-10-CM | POA: Diagnosis not present

## 2019-05-28 DIAGNOSIS — K644 Residual hemorrhoidal skin tags: Secondary | ICD-10-CM | POA: Diagnosis not present

## 2019-05-28 DIAGNOSIS — K635 Polyp of colon: Secondary | ICD-10-CM | POA: Diagnosis not present

## 2019-05-28 DIAGNOSIS — E785 Hyperlipidemia, unspecified: Secondary | ICD-10-CM | POA: Insufficient documentation

## 2019-05-28 DIAGNOSIS — R933 Abnormal findings on diagnostic imaging of other parts of digestive tract: Secondary | ICD-10-CM | POA: Insufficient documentation

## 2019-05-28 DIAGNOSIS — D125 Benign neoplasm of sigmoid colon: Secondary | ICD-10-CM | POA: Insufficient documentation

## 2019-05-28 DIAGNOSIS — Z8673 Personal history of transient ischemic attack (TIA), and cerebral infarction without residual deficits: Secondary | ICD-10-CM | POA: Insufficient documentation

## 2019-05-28 DIAGNOSIS — Z87891 Personal history of nicotine dependence: Secondary | ICD-10-CM | POA: Diagnosis not present

## 2019-05-28 DIAGNOSIS — D120 Benign neoplasm of cecum: Secondary | ICD-10-CM | POA: Diagnosis not present

## 2019-05-28 DIAGNOSIS — I2510 Atherosclerotic heart disease of native coronary artery without angina pectoris: Secondary | ICD-10-CM | POA: Diagnosis not present

## 2019-05-28 DIAGNOSIS — Z96653 Presence of artificial knee joint, bilateral: Secondary | ICD-10-CM | POA: Diagnosis not present

## 2019-05-28 DIAGNOSIS — Z79899 Other long term (current) drug therapy: Secondary | ICD-10-CM | POA: Diagnosis not present

## 2019-05-28 DIAGNOSIS — E119 Type 2 diabetes mellitus without complications: Secondary | ICD-10-CM | POA: Diagnosis not present

## 2019-05-28 DIAGNOSIS — K529 Noninfective gastroenteritis and colitis, unspecified: Secondary | ICD-10-CM | POA: Diagnosis not present

## 2019-05-28 DIAGNOSIS — K5730 Diverticulosis of large intestine without perforation or abscess without bleeding: Secondary | ICD-10-CM | POA: Diagnosis not present

## 2019-05-28 DIAGNOSIS — D122 Benign neoplasm of ascending colon: Secondary | ICD-10-CM | POA: Insufficient documentation

## 2019-05-28 DIAGNOSIS — Z8719 Personal history of other diseases of the digestive system: Secondary | ICD-10-CM | POA: Insufficient documentation

## 2019-05-28 HISTORY — PX: COLONOSCOPY WITH PROPOFOL: SHX5780

## 2019-05-28 HISTORY — PX: BIOPSY: SHX5522

## 2019-05-28 HISTORY — PX: POLYPECTOMY: SHX5525

## 2019-05-28 LAB — GLUCOSE, CAPILLARY: Glucose-Capillary: 112 mg/dL — ABNORMAL HIGH (ref 70–99)

## 2019-05-28 SURGERY — COLONOSCOPY WITH PROPOFOL
Anesthesia: General

## 2019-05-28 MED ORDER — CHLORHEXIDINE GLUCONATE CLOTH 2 % EX PADS: 6.0000 | MEDICATED_PAD | Freq: Once | CUTANEOUS | Status: DC

## 2019-05-28 MED ORDER — GLYCOPYRROLATE 0.2 MG/ML IJ SOLN
INTRAMUSCULAR | Status: DC | PRN
  Administered 2019-05-28: .2 mg via INTRAVENOUS

## 2019-05-28 MED ORDER — LIDOCAINE HCL (CARDIAC) PF 100 MG/5ML IV SOSY
PREFILLED_SYRINGE | INTRAVENOUS | Status: DC | PRN
  Administered 2019-05-28: 60 mg via INTRAVENOUS

## 2019-05-28 MED ORDER — KETAMINE HCL 10 MG/ML IJ SOLN
INTRAMUSCULAR | Status: DC | PRN
  Administered 2019-05-28: 20 mg via INTRAVENOUS

## 2019-05-28 MED ORDER — PROPOFOL 500 MG/50ML IV EMUL
INTRAVENOUS | Status: DC | PRN
  Administered 2019-05-28: 150 ug/kg/min via INTRAVENOUS
  Administered 2019-05-28: 08:00:00 via INTRAVENOUS

## 2019-05-28 MED ORDER — PROPOFOL 10 MG/ML IV BOLUS
INTRAVENOUS | Status: DC | PRN
  Administered 2019-05-28: 20 mg via INTRAVENOUS

## 2019-05-28 MED ORDER — LACTATED RINGERS IV SOLN
Freq: Once | INTRAVENOUS | Status: AC
  Administered 2019-05-28: 08:00:00 via INTRAVENOUS

## 2019-05-28 MED ORDER — KETAMINE HCL 50 MG/5ML IJ SOSY
PREFILLED_SYRINGE | INTRAMUSCULAR | Status: AC
  Filled 2019-05-28: qty 5

## 2019-05-28 NOTE — Op Note (Signed)
[facility] Patient Name: Arthur Lutz Procedure Date: 05/28/2019 7:59 AM MRN: [id number] Date of Birth: 1940-09-03 Attending MD: Hildred Laser , MD CSN: [id number] Age: 78 Admit Type: Outpatient Procedure:                Colonoscopy Indications:              Abnormal CT of the GI tract, Follow-up of colitis Providers:                Hildred Laser, MD, Otis Peak B. Sharon Seller, RN, Nelma Rothman, Technician Referring MD:             Redmond School, MD Medicines:                Propofol per Anesthesia Complications:            No immediate complications. Estimated Blood Loss:     Estimated blood loss was minimal. Procedure:                Pre-Anesthesia Assessment:                           - Prior to the procedure, a History and Physical                            was performed, and patient medications and                            allergies were reviewed. The patient's tolerance of                            previous anesthesia was also reviewed. The risks                            and benefits of the procedure and the sedation                            options and risks were discussed with the patient.                            All questions were answered, and informed consent                            was obtained. Prior Anticoagulants: The patient has                            taken no previous anticoagulant or antiplatelet                            agents. ASA Grade Assessment: II - A patient with                            mild systemic disease. After reviewing the risks  and benefits, the patient was deemed in                            satisfactory condition to undergo the procedure.                           After obtaining informed consent, the colonoscope                            was passed under direct vision. Throughout the                            procedure, the patient's blood pressure, pulse, and                  oxygen saturations were monitored continuously. The                            [id number]) was introduced through the                            anus and advanced to the the cecum, identified by                            appendiceal orifice and ileocecal valve. The                            colonoscopy was technically difficult and complex                            due to a redundant colon. The patient tolerated the                            procedure well. The quality of the bowel                            preparation was good. The ileocecal valve,                            appendiceal orifice, and rectum were photographed. Scope In: 8:13:07 AM Scope Out: 8:42:17 AM Scope Withdrawal Time: 0 hours 11 minutes 35 seconds  Total Procedure Duration: 0 hours 29 minutes 10 seconds  Findings:      The perianal and digital rectal examinations were normal.      A localized area of mildly erythematous mucosa was found in the cecum.       This was biopsied with a cold forceps for histology. The pathology       specimen was placed into Bottle Number 2.      A small polyp was found in the ascending colon. The polyp was sessile.       Biopsies were taken with a cold forceps for histology. The pathology       specimen was placed into Bottle Number 1.      A 4 mm polyp was found in the proximal sigmoid colon. The polyp was       removed with a cold  snare. Resection and retrieval were complete. The       pathology specimen was placed into Bottle Number 1.      Scattered diverticula were found in the sigmoid colon.      External hemorrhoids were found during retroflexion. The hemorrhoids       were small. Impression:               - Erythematous mucosa in the cecum. Biopsied.                           - One small polyp in the ascending colon. Biopsied.                           - One 4 mm polyp in the proximal sigmoid colon,                            removed with a cold  snare. Resected and retrieved.                           - Diverticulosis in the sigmoid colon.                           - External hemorrhoids. Moderate Sedation:      Per Anesthesia Care Recommendation:           - Patient has a contact number available for                            emergencies. The signs and symptoms of potential                            delayed complications were discussed with the                            patient. Return to normal activities tomorrow.                            Written discharge instructions were provided to the                            patient.                           - High fiber diet today.                           - Continue present medications.                           - No aspirin, ibuprofen, naproxen, or other                            non-steroidal anti-inflammatory drugs for 1 day.                           - Await pathology results.                           -  Repeat colonoscopy for surveillance based on                            pathology results. Procedure Code(s):        --- Professional ---                           [phone], Colonoscopy, flexible; with removal of                            tumor(s), polyp(s), or other lesion(s) by snare                            technique                           45380, 78, Colonoscopy, flexible; with biopsy,                            single or multiple Diagnosis Code(s):        --- Professional ---                           K63.89, Other specified diseases of intestine                           K63.5, Polyp of colon                           K64.4, Residual hemorrhoidal skin tags                           K52.9, Noninfective gastroenteritis and colitis,                            unspecified                           K57.30, Diverticulosis of large intestine without                            perforation or abscess without bleeding                           R93.3, Abnormal findings  on diagnostic imaging of                            other parts of digestive tract CPT copyright 2019 American [employer] Association. All rights reserved. The codes documented in this report are preliminary and upon coder review may  be revised to meet current compliance requirements. Hildred Laser, MD Hildred Laser, MD 05/28/2019 8:56:06 AM This report has been signed electronically. Number of Addenda: 0

## 2019-05-28 NOTE — Discharge Instructions (Signed)
No aspirin or NSAIDs for 24 hours.   °Resume usual medications as before. °High-fiber diet. °No driving for 24 hours. °Physician will call with biopsy results. °

## 2019-05-28 NOTE — H&P (Signed)
Arthur Lutz is an 78 y.o. male.   Chief Complaint: Patient is here for colonoscopy. HPI: Patient is 78 year old Caucasian male who has history of colonic diverticulosis and polyps whose last colonoscopy was in May 2016 was in usual state of health until about 2 months ago when he developed abdominal pain and diarrhea.  Abdominopelvic CT 2 months ago revealed wall thickening to ascending colon.  He was felt to have infection.  He was treated with antibiotics and his pain and diarrhea has resolved.  He is returning for diagnostic colonoscopy to make sure he does not have a sending colon carcinoma.  His bowels are now normal.  He may have mild discomfort every now and then.  He denies rectal bleeding.  He has good appetite.  His weight has been stable.  Family history is negative for CRC or IBD.  He does not take aspirin or anticoagulants.  Past Medical History:  Diagnosis Date  . Allergic rhinitis   . Aortic valve sclerosis    Cause of murmur  . Arthritis of knee, left, needs total Knee in future with Dr. Wynelle Link  08/07/2011  . Bladder cancer ([place]) 11/2005  . CAD S/P percutaneous coronary angioplasty 08/07/2011   NEW 08/07/11 cutting balloon PTCA to 95% ostial diag. and 90%  proximal LCX.  prior cutting balloon and to RCA  & ostium of ostial diag; Last Cath 02/2012 - Patent Diag PTCA, RCA & Circ BMS stents. Normal EF & EDP; Myoview 2/14 no evidence of ischemia or infarction  . Diabetes mellitus type 2, controlled, with complications ([place])   . Diabetes mellitus without complication ([place])   . Dyslipidemia   . History of heart attack 11/2007   "mild; did not affect the heart muscle"; PCI to RCA, Cutting PTCA Diag ostium  . Hypertension   . Macular degeneration   . Macular degeneration of right eye    Now getting shots.   . Melanoma of skin ([place]) ~ 2005   "level 2; on my back"  . Myocardial infarction ([place]) 2012  . Osteoarthritis    Status post right knee arthroplasty, positive for staph infection.  Pending right arthroplasty  . Portacath in place 08/11/2012  . Statin intolerance 08/07/2011  . Stroke [facility]) 1980's   "had facial strokes; 2; about a year apart; never did find what caused them"    Past Surgical History:  Procedure Laterality Date  . CARDIAC CATHETERIZATION  August '13   Widely patent RCA and LCx stents. Also patent PTCA site to D1, ~40-50% D2. Normal EF, normal EDP  . CATARACT EXTRACTION W/ INTRAOCULAR LENS IMPLANT  ~ 2010   right  . CATARACT EXTRACTION W/PHACO Left 11/21/2016   Procedure: CATARACT EXTRACTION PHACO AND INTRAOCULAR LENS PLACEMENT (IOC) CDE - 11.70 ;  Surgeon: Tonny Branch, MD;  Location: [facility] ORS;  Service: Ophthalmology;  Laterality: Left;  left  . cold cup removal bladder lesion  11/2005   "malignant"  . COLONOSCOPY N/A 12/09/2014   Procedure: COLONOSCOPY;  Surgeon: Rogene Houston, MD;  Location: [facility] ENDO SUITE;  Service: Endoscopy;  Laterality: N/A;  955 - moved to 8:30 - Ann to notify pt  . CORONARY ANGIOPLASTY WITH STENT PLACEMENT  11/2007   Mid RCA - Driver BMS 3.5 mm x 15 mm; 2.0 mm Cutting Balloon PTCA - ostial D1  . CORONARY ANGIOPLASTY WITH STENT PLACEMENT  08/07/11   Abnormal TM Myoview - for exertional throat discomfort: Patent RCA stent, 90% circumflex -- Vision BMS 3.5 mm x  12 mm --> 4.2 mm. Ostial D2 95% -- 2.25 mm Cutting Balloon PTCA  . DOPPLER ECHOCARDIOGRAPHY  May 2009   Normal EF, impaired relaxation, with sclerotic aortic valve. No stenosis.  . INGUINAL HERNIA REPAIR  ~ 1970   left  . KNEE ARTHROSCOPY  02/20/11   left  . LEFT HEART CATHETERIZATION WITH CORONARY ANGIOGRAM N/A 08/07/2011   Procedure: LEFT HEART CATHETERIZATION WITH CORONARY ANGIOGRAM;  Surgeon: Leonie Man, MD;  Location: [facility] CATH LAB;  Service: Cardiovascular;  Laterality: N/A;  . LEFT HEART CATHETERIZATION WITH CORONARY ANGIOGRAM N/A 03/06/2012   Procedure: LEFT HEART CATHETERIZATION WITH CORONARY ANGIOGRAM;  Surgeon: Leonie Man, MD;  Location: [facility] C[facility] LAB;  Service:  Cardiovascular;  Laterality: N/A;  . NM MYOVIEW LTD  Jan. 31, 2014   Low risk, EF 49%, improved from prior. Small apical and apical septal defect, fixed likely artifact no skin or infarction.  Marland Kitchen PARTIAL KNEE ARTHROPLASTY  06/2003   right  . PERCUTANEOUS CORONARY STENT INTERVENTION (PCI-S)  08/07/2011   Procedure: PERCUTANEOUS CORONARY STENT INTERVENTION (PCI-S);  Surgeon: Leonie Man, MD;  Location: [facility] CATH LAB;  Service: Cardiovascular;;  . TONSILLECTOMY     "as a child"  . TOTAL KNEE ARTHROPLASTY  11/2003   right x2 and one on left- Total of 3.  . TOTAL KNEE ARTHROPLASTY Left 09/28/2012   Procedure: TOTAL KNEE ARTHROPLASTY;  Surgeon: Gearlean Alf, MD;  Location: [facility] ORS;  Service: Orthopedics;  Laterality: Left;    Family History  Problem Relation Age of Onset  . Emphysema Father   . Lung cancer Paternal Uncle        x 2 smokers  . Colon cancer Neg Hx    Social History:  reports that he quit smoking about 38 years ago. His smoking use included cigarettes. He has a 56.00 pack-year smoking history. He has never used smokeless tobacco. He reports current alcohol use of about 2.0 standard drinks of alcohol per week. He reports that he does not use drugs.  Allergies:  Allergies  Allergen Reactions  . Rosuvastatin Calcium Other (See Comments)    Myalgias, tired crestor -aches    Medications Prior to Admission  Medication Sig Dispense Refill  . acetaminophen (TYLENOL) 650 MG CR tablet Take 650 mg by mouth every 8 (eight) hours as needed for pain.    Marland Kitchen amLODipine (NORVASC) 10 MG tablet TAKE 1 TABLET(10 MG) BY MOUTH DAILY (Patient taking differently: Take 10 mg by mouth daily. ) 90 tablet 1  . Ascorbic Acid (VITAMIN C) 1000 MG tablet Take 1,000 mg by mouth daily.    . Cholecalciferol (VITAMIN D) 50 MCG (2000 UT) tablet Take 2,000 Units by mouth daily.    . clobetasol (TEMOVATE) 0.05 % external solution Apply 1 application topically daily as needed (irritation).     . clobetasol cream  (TEMOVATE) [id number] % Apply 1 application topically 2 (two) times daily as needed (irritation).     . Coenzyme Q10 (CO Q-10) 100 MG CAPS Take 300 capsules by mouth 3 (three) times daily. 90 each 11  . cyclobenzaprine (FLEXERIL) 10 MG tablet Take 1 tablet (10 mg total) by mouth 2 (two) times daily as needed for muscle spasms. 20 tablet 0  . ezetimibe (ZETIA) 10 MG tablet TAKE 1 TABLET(10 MG) BY MOUTH DAILY (Patient taking differently: Take 10 mg by mouth daily. ) 90 tablet 3  . Fluticasone Propionate (XHANCE) 93 MCG/ACT [place] 2 application into the nose 2 (two) times a  day. (Patient taking differently: Place 2 application into the nose 2 (two) times daily as needed (nasal problems). ) 32 mL 11  . lisinopril (ZESTRIL) 40 MG tablet Take 0.5 tablets (20 mg total) by mouth daily. 90 tablet 3  . loperamide (IMODIUM) 2 MG capsule Take 1 capsule (2 mg total) by mouth 4 (four) times daily as needed for diarrhea or loose stools. 12 capsule 0  . magnesium gluconate (MAGONATE) 500 MG tablet Take 500 mg by mouth daily.    . metFORMIN (GLUCOPHAGE) 500 MG tablet Take 500 mg by mouth daily with breakfast.     . metoprolol succinate (TOPROL XL) 25 MG 24 hr tablet Take 0.5 tablets (12.5 mg total) by mouth daily. 45 tablet 3  . Multiple Vitamins-Minerals (PRESERVISION AREDS PO) Take 1 capsule by mouth 2 (two) times daily.     Marland Kitchen nystatin-triamcinolone (MYCOLOG II) cream Apply 1 application topically 2 (two) times daily as needed (dry skin).     . predniSONE (DELTASONE) 10 MG tablet Take 10 mg by mouth daily as needed (arthritis). 3,3, 2, 1 tabs    . simvastatin (ZOCOR) 10 MG tablet TAKE 1 TABLET BY MOUTH DAILY AT 6 PM (Patient taking differently: Take 10 mg by mouth daily at 6 PM. ) 90 tablet 2  . tamsulosin (FLOMAX) 0.4 MG CAPS capsule Take 0.4 mg by mouth daily as needed (prostate).     . triazolam (HALCION) 0.125 MG tablet Take 1.5 tablets (0.1875 mg total) by mouth at bedtime. Pt takes 1.5 tabs at bedtime (Patient  taking differently: Take 0.25 mg by mouth at bedtime. ) 30 tablet 5  . EPINEPHrine 0.3 mg/0.3 mL IJ SOAJ injection Inject 0.3 mg into the muscle as needed for anaphylaxis.       Results for orders placed or performed during the hospital encounter of 05/28/19 (from the past 48 hour(s))  Glucose, capillary     Status: Abnormal   Collection Time: 05/28/19  7:32 AM  Result Value Ref Range   Glucose-Capillary 112 (H) 70 - 99 mg/dL   No results found.  ROS  Blood pressure (!) 168/75, pulse (!) 59, temperature 97.9 F (36.6 C), temperature source Oral, resp. rate 16, SpO2 92 %. Physical Exam  Constitutional: He appears well-developed and well-nourished.  HENT:  Mouth/Throat: Oropharynx is clear and moist.  Eyes: Conjunctivae are normal. No scleral icterus.  Neck: No thyromegaly present.  Cardiovascular: Normal rate and regular rhythm.  Murmur heard. Faint systolic murmur at aortic area.  Respiratory: Effort normal and breath sounds normal.  GI:  Abdomen is full.  He has mild tenderness in left lower quadrant.  No guarding or rebound.  No organomegaly or masses.  Musculoskeletal:        General: No edema.  Lymphadenopathy:    He has no cervical adenopathy.  Neurological: He is alert.  Skin: Skin is warm and dry.     Assessment/Plan History of right-sided colitis and abnormal CT. History of colonic polyps. Diagnostic colonoscopy.  Hildred Laser, MD 05/28/2019, 8:02 AM

## 2019-05-28 NOTE — Transfer of Care (Signed)
Immediate Anesthesia Transfer of Care Note  Patient: Arthur Lutz  Procedure(s) Performed: COLONOSCOPY WITH PROPOFOL (N/A ) POLYPECTOMY BIOPSY  Patient Location: PACU  Anesthesia Type:General  Level of Consciousness: awake, alert , oriented and patient cooperative  Airway & Oxygen Therapy: Patient Spontanous Breathing  Post-op Assessment: Report given to RN and Post -op Vital signs reviewed and stable  Post vital signs: Reviewed and stable  Last Vitals:  Vitals Value Taken Time  BP    Temp    Pulse 66 05/28/19 0848  Resp 18 05/28/19 0850  SpO2 100 % 05/28/19 0848  Vitals shown include unvalidated device data.  Last Pain:  Vitals:   05/28/19 0808  TempSrc:   PainSc: 0-No pain      Patients Stated Pain Goal: 7 ([id number])  Complications: No apparent anesthesia complications

## 2019-05-28 NOTE — Anesthesia Preprocedure Evaluation (Addendum)
Anesthesia Evaluation  Patient identified by MRN, date of birth, ID band Patient awake    Reviewed: Allergy & Precautions, NPO status , Patient's Chart, lab work & pertinent test results, reviewed documented beta blocker date and time   History of Anesthesia Complications Negative for: history of anesthetic complications  Airway Mallampati: III  TM Distance: >3 FB Neck ROM: Full    Dental  (+) Partial Upper, Upper Dentures   Pulmonary sleep apnea , former smoker,    Pulmonary exam normal        Cardiovascular METS: 3 - Mets hypertension, Pt. on medications and Pt. on home beta blockers + angina (pateint denied having chest pain with exertion) + CAD, + Past MI, + Cardiac Stents (10 years ago) and + DOE  Normal cardiovascular exam Rhythm:Regular Rate:Normal  Sinus bradycardia. Incomplete RBBB   Neuro/Psych CVA, No Residual Symptoms    GI/Hepatic   Endo/Other  diabetes, Well Controlled, Type 2  Renal/GU      Musculoskeletal  (+) Arthritis , Osteoarthritis,    Abdominal   Peds  Hematology  (+) anemia ,   Anesthesia Other Findings   Reproductive/Obstetrics                          Anesthesia Physical Anesthesia Plan  ASA: III  Anesthesia Plan: General   Post-op Pain Management:    Induction: Intravenous  PONV Risk Score and Plan: TIVA  Airway Management Planned: Natural Airway, Nasal Cannula and Simple Face Mask  Additional Equipment:   Intra-op Plan:   Post-operative Plan:   Informed Consent: I have reviewed the patients History and Physical, chart, labs and discussed the procedure including the risks, benefits and alternatives for the proposed anesthesia with the patient or authorized representative who has indicated his/her understanding and acceptance.     Dental advisory given  Plan Discussed with: CRNA  Anesthesia Plan Comments:         Anesthesia Quick  Evaluation

## 2019-05-28 NOTE — Anesthesia Postprocedure Evaluation (Signed)
Anesthesia Post Note  Patient: Arthur Lutz  Procedure(s) Performed: COLONOSCOPY WITH PROPOFOL (N/A ) POLYPECTOMY BIOPSY  Patient location during evaluation: PACU Anesthesia Type: General Level of consciousness: awake and alert, oriented and patient cooperative Pain management: pain level controlled Vital Signs Assessment: post-procedure vital signs reviewed and stable Respiratory status: spontaneous breathing Cardiovascular status: stable Postop Assessment: no apparent nausea or vomiting Anesthetic complications: no     Last Vitals:  Vitals:   05/28/19 0729 05/28/19 0850  BP: (!) 168/75 (!) 115/59  Pulse: (!) 59 66  Resp: 16 18  Temp: 36.6 C (P) 36.5 C  SpO2: 92%     Last Pain:  Vitals:   05/28/19 0808  TempSrc:   PainSc: 0-No pain                 ADAMS, AMY A

## 2019-05-31 LAB — SURGICAL PATHOLOGY

## 2019-06-01 ENCOUNTER — Encounter (HOSPITAL_COMMUNITY): Payer: Self-pay | Admitting: Internal Medicine

## 2019-06-01 DIAGNOSIS — H353213 Exudative age-related macular degeneration, right eye, with inactive scar: Secondary | ICD-10-CM | POA: Diagnosis not present

## 2019-06-01 DIAGNOSIS — H353221 Exudative age-related macular degeneration, left eye, with active choroidal neovascularization: Secondary | ICD-10-CM | POA: Diagnosis not present

## 2019-06-01 DIAGNOSIS — H31011 Macula scars of posterior pole (postinflammatory) (post-traumatic), right eye: Secondary | ICD-10-CM | POA: Diagnosis not present

## 2019-06-01 DIAGNOSIS — H43813 Vitreous degeneration, bilateral: Secondary | ICD-10-CM | POA: Diagnosis not present

## 2019-06-02 ENCOUNTER — Ambulatory Visit (INDEPENDENT_AMBULATORY_CARE_PROVIDER_SITE_OTHER): Payer: Medicare Other

## 2019-06-02 DIAGNOSIS — J309 Allergic rhinitis, unspecified: Secondary | ICD-10-CM | POA: Diagnosis not present

## 2019-06-16 ENCOUNTER — Ambulatory Visit: Payer: Self-pay

## 2019-06-16 DIAGNOSIS — J309 Allergic rhinitis, unspecified: Secondary | ICD-10-CM

## 2019-06-17 DIAGNOSIS — Z6835 Body mass index (BMI) 35.0-35.9, adult: Secondary | ICD-10-CM | POA: Diagnosis not present

## 2019-06-17 DIAGNOSIS — I2510 Atherosclerotic heart disease of native coronary artery without angina pectoris: Secondary | ICD-10-CM | POA: Diagnosis not present

## 2019-06-17 DIAGNOSIS — E1129 Type 2 diabetes mellitus with other diabetic kidney complication: Secondary | ICD-10-CM | POA: Diagnosis not present

## 2019-06-17 DIAGNOSIS — Z23 Encounter for immunization: Secondary | ICD-10-CM | POA: Diagnosis not present

## 2019-06-17 DIAGNOSIS — Z0001 Encounter for general adult medical examination with abnormal findings: Secondary | ICD-10-CM | POA: Diagnosis not present

## 2019-06-17 DIAGNOSIS — E6609 Other obesity due to excess calories: Secondary | ICD-10-CM | POA: Diagnosis not present

## 2019-06-30 ENCOUNTER — Ambulatory Visit (INDEPENDENT_AMBULATORY_CARE_PROVIDER_SITE_OTHER): Payer: Medicare Other

## 2019-06-30 DIAGNOSIS — J309 Allergic rhinitis, unspecified: Secondary | ICD-10-CM

## 2019-07-02 DIAGNOSIS — N400 Enlarged prostate without lower urinary tract symptoms: Secondary | ICD-10-CM | POA: Diagnosis not present

## 2019-07-02 DIAGNOSIS — N281 Cyst of kidney, acquired: Secondary | ICD-10-CM | POA: Diagnosis not present

## 2019-07-02 DIAGNOSIS — N138 Other obstructive and reflux uropathy: Secondary | ICD-10-CM | POA: Diagnosis not present

## 2019-07-02 DIAGNOSIS — C672 Malignant neoplasm of lateral wall of bladder: Secondary | ICD-10-CM | POA: Diagnosis not present

## 2019-07-02 DIAGNOSIS — R1031 Right lower quadrant pain: Secondary | ICD-10-CM | POA: Diagnosis not present

## 2019-07-02 DIAGNOSIS — N401 Enlarged prostate with lower urinary tract symptoms: Secondary | ICD-10-CM | POA: Diagnosis not present

## 2019-07-05 DIAGNOSIS — E1151 Type 2 diabetes mellitus with diabetic peripheral angiopathy without gangrene: Secondary | ICD-10-CM | POA: Diagnosis not present

## 2019-07-05 DIAGNOSIS — E1140 Type 2 diabetes mellitus with diabetic neuropathy, unspecified: Secondary | ICD-10-CM | POA: Diagnosis not present

## 2019-07-07 ENCOUNTER — Telehealth (INDEPENDENT_AMBULATORY_CARE_PROVIDER_SITE_OTHER): Payer: Self-pay | Admitting: Nurse Practitioner

## 2019-07-07 ENCOUNTER — Other Ambulatory Visit (INDEPENDENT_AMBULATORY_CARE_PROVIDER_SITE_OTHER): Payer: Self-pay | Admitting: Nurse Practitioner

## 2019-07-07 DIAGNOSIS — R197 Diarrhea, unspecified: Secondary | ICD-10-CM

## 2019-07-07 NOTE — Telephone Encounter (Signed)
I called the patient he reports having 1-2 loose to watery diarrhea stools for the past 2 days.  No abdominal pain.  No rectal bleeding or fever.  His recent colonoscopy was negative for colitis several benign polyps were identified.  He stated this is similar to his past diarrhea episodes earlier in the fall.  I advised patient to complete a GI panel and I will enter the order now.  He has taken Imodium 1 tab last night and 1 tab this morning without any further diarrhea at this time.  If he has recurrence of diarrhea later today he can take 1 more Imodium tablet or try Pepto-Bismol 1-2 tabs once to twice daily.  If his symptoms are worse he will call the office tomorrow and we will see him as an appointment for further evaluation.

## 2019-07-07 NOTE — Telephone Encounter (Signed)
Patient called stated he has had diarrhea for several days - would like something called into his pharmacy which is [facility] on [address] in [place] - patient ph# [phone]

## 2019-07-09 ENCOUNTER — Other Ambulatory Visit: Payer: Self-pay

## 2019-07-09 ENCOUNTER — Ambulatory Visit (INDEPENDENT_AMBULATORY_CARE_PROVIDER_SITE_OTHER): Payer: Medicare Other

## 2019-07-09 DIAGNOSIS — J309 Allergic rhinitis, unspecified: Secondary | ICD-10-CM | POA: Diagnosis not present

## 2019-07-09 DIAGNOSIS — Z20822 Contact with and (suspected) exposure to covid-19: Secondary | ICD-10-CM

## 2019-07-09 DIAGNOSIS — R197 Diarrhea, unspecified: Secondary | ICD-10-CM | POA: Diagnosis not present

## 2019-07-10 LAB — NOVEL CORONAVIRUS, NAA: SARS-CoV-2, NAA: NOT DETECTED

## 2019-07-12 LAB — GASTROINTESTINAL PATHOGEN PANEL PCR
C. difficile Tox A/B, PCR: DETECTED — AB
Campylobacter, PCR: NOT DETECTED
Cryptosporidium, PCR: NOT DETECTED
E coli (ETEC) LT/ST PCR: NOT DETECTED
E coli (STEC) stx1/stx2, PCR: NOT DETECTED
E coli 0157, PCR: NOT DETECTED
Giardia lamblia, PCR: NOT DETECTED
Norovirus, PCR: NOT DETECTED
Rotavirus A, PCR: NOT DETECTED
Salmonella, PCR: NOT DETECTED
Shigella, PCR: NOT DETECTED

## 2019-07-14 ENCOUNTER — Ambulatory Visit (INDEPENDENT_AMBULATORY_CARE_PROVIDER_SITE_OTHER): Payer: Medicare Other | Admitting: *Deleted

## 2019-07-14 DIAGNOSIS — J309 Allergic rhinitis, unspecified: Secondary | ICD-10-CM | POA: Diagnosis not present

## 2019-07-20 ENCOUNTER — Other Ambulatory Visit: Payer: Self-pay

## 2019-07-20 ENCOUNTER — Encounter (HOSPITAL_COMMUNITY)
Admission: RE | Admit: 2019-07-20 | Discharge: 2019-07-20 | Disposition: A | Discharge status: Home / Self Care | Payer: Medicare Other | Source: Ambulatory Visit | Attending: Family Medicine | Admitting: Family Medicine

## 2019-07-20 DIAGNOSIS — Z452 Encounter for adjustment and management of vascular access device: Secondary | ICD-10-CM | POA: Insufficient documentation

## 2019-07-20 MED ORDER — HEPARIN SOD (PORK) LOCK FLUSH 100 UNIT/ML IV SOLN
500.0000 [IU] | Freq: Once | INTRAVENOUS | Status: AC
  Administered 2019-07-20: 500 [IU] via INTRAVENOUS

## 2019-07-21 ENCOUNTER — Ambulatory Visit (INDEPENDENT_AMBULATORY_CARE_PROVIDER_SITE_OTHER): Payer: Medicare Other

## 2019-07-21 DIAGNOSIS — J309 Allergic rhinitis, unspecified: Secondary | ICD-10-CM | POA: Diagnosis not present

## 2019-07-28 DIAGNOSIS — H353221 Exudative age-related macular degeneration, left eye, with active choroidal neovascularization: Secondary | ICD-10-CM | POA: Diagnosis not present

## 2019-07-28 DIAGNOSIS — H353213 Exudative age-related macular degeneration, right eye, with inactive scar: Secondary | ICD-10-CM | POA: Diagnosis not present

## 2019-07-28 DIAGNOSIS — H35422 Microcystoid degeneration of retina, left eye: Secondary | ICD-10-CM | POA: Diagnosis not present

## 2019-07-28 DIAGNOSIS — H43813 Vitreous degeneration, bilateral: Secondary | ICD-10-CM | POA: Diagnosis not present

## 2019-08-01 ENCOUNTER — Other Ambulatory Visit: Payer: Self-pay | Admitting: Cardiology

## 2019-08-04 ENCOUNTER — Ambulatory Visit (INDEPENDENT_AMBULATORY_CARE_PROVIDER_SITE_OTHER): Payer: Medicare Other

## 2019-08-04 DIAGNOSIS — J309 Allergic rhinitis, unspecified: Secondary | ICD-10-CM

## 2019-08-18 ENCOUNTER — Ambulatory Visit (INDEPENDENT_AMBULATORY_CARE_PROVIDER_SITE_OTHER): Payer: Medicare Other

## 2019-08-18 DIAGNOSIS — J309 Allergic rhinitis, unspecified: Secondary | ICD-10-CM

## 2019-08-20 ENCOUNTER — Ambulatory Visit (INDEPENDENT_AMBULATORY_CARE_PROVIDER_SITE_OTHER): Payer: Medicare Other

## 2019-08-20 DIAGNOSIS — J309 Allergic rhinitis, unspecified: Secondary | ICD-10-CM | POA: Diagnosis not present

## 2019-08-27 ENCOUNTER — Ambulatory Visit (INDEPENDENT_AMBULATORY_CARE_PROVIDER_SITE_OTHER): Payer: Medicare Other

## 2019-08-27 DIAGNOSIS — J309 Allergic rhinitis, unspecified: Secondary | ICD-10-CM | POA: Diagnosis not present

## 2019-08-30 DIAGNOSIS — R972 Elevated prostate specific antigen [PSA]: Secondary | ICD-10-CM | POA: Diagnosis not present

## 2019-08-30 DIAGNOSIS — N138 Other obstructive and reflux uropathy: Secondary | ICD-10-CM | POA: Diagnosis not present

## 2019-08-30 DIAGNOSIS — N401 Enlarged prostate with lower urinary tract symptoms: Secondary | ICD-10-CM | POA: Diagnosis not present

## 2019-08-30 DIAGNOSIS — Z8551 Personal history of malignant neoplasm of bladder: Secondary | ICD-10-CM | POA: Diagnosis not present

## 2019-09-01 ENCOUNTER — Ambulatory Visit (INDEPENDENT_AMBULATORY_CARE_PROVIDER_SITE_OTHER): Payer: Medicare Other

## 2019-09-01 DIAGNOSIS — J309 Allergic rhinitis, unspecified: Secondary | ICD-10-CM

## 2019-09-03 ENCOUNTER — Ambulatory Visit (INDEPENDENT_AMBULATORY_CARE_PROVIDER_SITE_OTHER): Payer: Medicare Other

## 2019-09-03 DIAGNOSIS — J309 Allergic rhinitis, unspecified: Secondary | ICD-10-CM | POA: Diagnosis not present

## 2019-09-08 ENCOUNTER — Ambulatory Visit (INDEPENDENT_AMBULATORY_CARE_PROVIDER_SITE_OTHER): Payer: Medicare Other | Admitting: Allergy & Immunology

## 2019-09-08 ENCOUNTER — Other Ambulatory Visit: Payer: Self-pay

## 2019-09-08 ENCOUNTER — Encounter: Payer: Self-pay | Admitting: Allergy & Immunology

## 2019-09-08 VITALS — BP 110/68 | HR 52 | Temp 98.3°F | Resp 18 | Ht 72.0 in | Wt 242.0 lb

## 2019-09-08 DIAGNOSIS — J3089 Other allergic rhinitis: Secondary | ICD-10-CM | POA: Diagnosis not present

## 2019-09-08 DIAGNOSIS — L409 Psoriasis, unspecified: Secondary | ICD-10-CM | POA: Diagnosis not present

## 2019-09-08 DIAGNOSIS — J302 Other seasonal allergic rhinitis: Secondary | ICD-10-CM | POA: Diagnosis not present

## 2019-09-08 DIAGNOSIS — L299 Pruritus, unspecified: Secondary | ICD-10-CM

## 2019-09-08 MED ORDER — PREDNISONE 10 MG PO TABS: 10.0000 mg | ORAL_TABLET | Freq: Every day | ORAL | 0 refills | Status: DC | PRN

## 2019-09-08 MED ORDER — EUCRISA 2 % EX OINT: 1.0000 "application " | TOPICAL_OINTMENT | Freq: Two times a day (BID) | CUTANEOUS | 5 refills | Status: DC | PRN

## 2019-09-08 MED ORDER — XHANCE 93 MCG/ACT NA EXHU: 2.0000 "application " | INHALANT_SUSPENSION | Freq: Two times a day (BID) | NASAL | 6 refills | Status: DC

## 2019-09-08 NOTE — Progress Notes (Signed)
FOLLOW UP  Date of Service/Encounter:  09/08/19   Assessment:   Perennial and seasonal allergic rhinitis (dust mites, mold, cockroach) - on allergen immunotherapy  Psoriasis  Itching - for six months or more  Plan/Recommendations:   1. Chronic rhinitis (dust mites, mold, cockroach) - Continue with allergy shots at the same schedule. - Continue with: Coricidin as needed, Xhance (fluticasone) 1-2 sprays per nostril daily, and Astelin (azelastine) 2 sprays per nostril up to twice daily  - Continue with Navage as needed.  2. Itching - Try changing to an unscented and dye free detergent. - Try changing to [place] for your soap. - Try to moisturize after your hot baths with one of the ointments listed below. - Add on Eucrisa twice daily as needed for the itching.  - Add on Xyzal 5mg  to see if this helps with the itching.   3. Return in about 6 months (around 03/07/2020). This can be an in-person, a virtual Webex or a telephone follow up visit.   Subjective:   Arthur Lutz is a 79 y.o. male presenting today for follow up of  Chief Complaint  Patient presents with  . Allergic Rhinitis     has been doing okay. no problems with his injections. he says his allergy issues seem to have been improving with the injections.   . Pruritis    itching is the primary concern today. he says that he itches more on some days than others. itching has been ongoing at minimum 6 months. has tried changing detergents. his back, shoulders, chest and beltline are the main places    Arthur Lutz has a history of the following: Patient Active Problem List   Diagnosis Date Noted  . Psoriasis 04/23/2019  . Colitis 04/06/2019  . Diarrhea 04/06/2019  . Abdominal pain 03/29/2019  . Seasonal and perennial allergic rhinitis 01/06/2019  . Sinus bradycardia 08/29/2017  . Chronic stable angina (Arthur Lutz) - negative Myoview & no new Dz on Cath 05/23/2015  . Insomnia due to stress 07/04/2014   Class: Chronic  . Venous stasis of both lower extremities 11/24/2013  . Abnormal biliary HIDA scan 09/15/2013  . Intestinal bacterial overgrowth 08/16/2013  . Bloating 08/16/2013  . Obesity (BMI 30.0-34.9) 01/30/2013  . Stiffness of joint, not elsewhere classified, lower leg 10/26/2012  . Weakness of left leg 10/26/2012  . Difficulty in walking 10/26/2012  . Postoperative anemia due to acute blood loss 09/29/2012  . OA (osteoarthritis) of knee 09/28/2012  . Portacath in place 08/11/2012  . CAD S/P percutaneous coronary angioplasty: 08/07/11 cutting balloon PTCA to 95% ostial diag. and 90%  proximal LCX.  prior cutting balloon to RCA  & ostium of ostial diag., 2009  08/07/2011  . Essential hypertension 08/07/2011  . DOE (dyspnea on exertion) 08/07/2011  . Diabetes mellitus type 2, controlled, with complications ([place]) [id number]  . Hyperlipemia, intolerant to statins. Goal LDL < 70 08/07/2011  . Arthritis of knee, left, needs total Knee in future with Dr. Wynelle Link  08/07/2011  . Statin intolerance 08/07/2011    History obtained from: chart review and patient.  Cashe is a 79 y.o. male presenting for a follow up visit.  He was last seen in August 2020.  At that time, he felt that the [facility] was providing a lot of relief so we did work on getting this covered.  We continued Coricidin as needed as well as Astelin.  This combination as well as the allergy shots has provided quite a  bit of relief of his symptoms.  In the interim, he reports that he has had itching that has become worse over the last 6 months.  He denies any new medications.  He did start taking extra vitamin C, vitamin D, and zinc around the time of the [place]. He otherwise has started no other medications. They have tried changing the detergent, but it continues to be an issue.  However, he does tell me that the new detergent still contains scents and is not dye free. He is open to trying some samples of some different detergents.  He has not tried using any particular ointments aside from clobetasol.  He has not tried any antihistamines.  He notices no worsening of the itch with any particular food.  He does not routinely have a rash with this.  He does not have any systemic symptoms including wheezing, throat swelling, or shortness of breath.  He does not have any GI complaints with this.  It does not seem to be worse inside versus outside.  From an allergic rhinitis standpoint, the allergy shots do seem to be helping.  He has not had any reactions to the injections themselves.  He remains on the [place].  He continues to have problems with arthritis which he treats with intermittent prednisone burst.  He seems to keep a supply at home and just use them as needed, usually around once per month.  He is wondering if he can refill his prednisone until he follows up with his primary care provider.  He has an appointment in the next couple of months.  He continues to follow with Dr. Delman Cheadle for his psoriasis.  He has an appointment with her in the next month.  He will bring the itching up with her as well.  Otherwise, there have been no changes to his past medical history, surgical history, family history, or social history.    Review of Systems  Constitutional: Negative.  Negative for chills, fever, malaise/fatigue and weight loss.  HENT: Negative.  Negative for congestion, ear discharge and ear pain.   Eyes: Negative for pain, discharge and redness.  Respiratory: Negative for cough, sputum production, shortness of breath and wheezing.   Cardiovascular: Negative.  Negative for chest pain and palpitations.  Gastrointestinal: Negative for abdominal pain, constipation, diarrhea, heartburn, nausea and vomiting.  Skin: Positive for itching. Negative for rash.  Neurological: Negative for dizziness and headaches.  Endo/Heme/Allergies: Negative for environmental allergies. Does not bruise/bleed easily.       Objective:   Blood  pressure 110/68, pulse (!) 52, temperature 98.3 F (36.8 C), temperature source Temporal, resp. rate 18, height 6' (1.829 m), weight 242 lb (109.8 kg), SpO2 95 %. Body mass index is 32.82 kg/m.   Physical Exam:  Physical Exam  Constitutional: He appears well-developed and well-nourished.  Boisterous male.  Very talkative.  HENT:  Head: Normocephalic and atraumatic.  Right Ear: Tympanic membrane, external ear and ear canal normal.  Left Ear: Tympanic membrane and ear canal normal.  Nose: No mucosal edema, rhinorrhea, nasal deformity or septal deviation. No epistaxis. Right sinus exhibits no maxillary sinus tenderness and no frontal sinus tenderness. Left sinus exhibits no maxillary sinus tenderness and no frontal sinus tenderness.  Mouth/Throat: Uvula is midline and oropharynx is clear and moist. Mucous membranes are not pale and not dry.  Turbinates enlarged bilaterally.  Eyes: Pupils are equal, round, and reactive to light. Conjunctivae and EOM are normal. Right eye exhibits no chemosis and no discharge. Left  eye exhibits no chemosis and no discharge. Right conjunctiva is not injected. Left conjunctiva is not injected.  Cardiovascular: Normal rate, regular rhythm and normal heart sounds.  Respiratory: Effort normal and breath sounds normal. No accessory muscle usage. No tachypnea. No respiratory distress. He has no wheezes. He has no rhonchi. He has no rales. He exhibits no tenderness.  Moving air well in all lung fields.  No increased work of breathing.  Lymphadenopathy:    He has no cervical adenopathy.  Neurological: He is alert.  Skin: No abrasion, no petechiae and no rash noted. Rash is not papular, not vesicular and not urticarial. No erythema. No pallor.  There are some excoriations present on the patient's back.  No urticaria or pustules.  Psychiatric: He has a normal mood and affect.     Diagnostic studies: none    Salvatore Marvel, MD  Allergy and [place] [facility] [place]

## 2019-09-08 NOTE — Patient Instructions (Addendum)
1. Chronic rhinitis (dust mites, mold, cockroach) - Continue with allergy shots at the same schedule. - Continue with: Coricidin as needed, Xhance (fluticasone) 1-2 sprays per nostril daily, and Astelin (azelastine) 2 sprays per nostril up to twice daily  - Continue with Navage as needed.  2. Itching - Try changing to an unscented and dye free detergent. - Try changing to [place] for your soap. - Try to moisturize after your hot baths with one of the ointments listed below. - Add on Eucrisa twice daily as needed for the itching.  - Add on Xyzal 5mg  to see if this helps with the itching.   3. Return in about 6 months (around 03/07/2020). This can be an in-person, a virtual Webex or a telephone follow up visit.   Please inform us of any Emergency Department visits, hospitalizations, or changes in symptoms. Call us before going to the ED for breathing or allergy symptoms since we might be able to fit you in for a sick visit. Feel free to contact us anytime with any questions, problems, or concerns.  It was a pleasure to see you again today!  Websites that have reliable patient information: 1. American Academy of Asthma, Allergy, and Immunology: www.aaaai.org 2. Food Allergy Research and Education (FARE): foodallergy.org 3. Mothers of Asthmatics: http://www.asthmacommunitynetwork.org 4. American College of Allergy, Asthma, and Immunology: www.acaai.org   COVID-19 Vaccine Information can be found at: ShippingScam.co.uk For questions related to vaccine distribution or appointments, please email vaccine@[email or web address] or call [phone].     "Like" [place] on Facebook and Instagram for our latest updates!        Make sure you are registered to vote! If you have moved or changed any of your contact information, you will need to get this updated before voting!  In some cases, you MAY be able to register to vote online:  [email or web address]     ECZEMA SKIN CARE REGIMEN:  Bathed and soak for 10 minutes in warm water once today. Pat dry.  Immediately apply the below creams:  To healthy skin apply Aquaphor, Eucerin, Vanicream, Cerave, or Vaseline jelly twice a day.      Note of any foods make the eczema worse.  Keep finger nails trimmed and filed.  Use soaps and shampoos that are unscented and have the fewest amounts of additives. Some good examples include:

## 2019-09-10 ENCOUNTER — Telehealth: Payer: Self-pay

## 2019-09-10 NOTE — Telephone Encounter (Signed)
PA has been submitted for Eucrisa through cover my meds.  Awaiting approval or denial.  Your information has been submitted to [place]. [employer] [place] will review the request and notify you of the determination decision directly, typically within 3 business days of your submission and once all necessary information is received.  You will also receive your request decision electronically. To check for an update later, open the request again from your dashboard.  If [employer] has not responded within the specified timeframe or if you have any questions about your PA submission, contact [place] directly at [facility]) [phone] or ([place]) [phone].

## 2019-09-13 NOTE — Telephone Encounter (Signed)
PA for Georga Hacking was denied under patient's Medicare Part D. Please advise an alternative ointment.

## 2019-09-14 DIAGNOSIS — H43813 Vitreous degeneration, bilateral: Secondary | ICD-10-CM | POA: Diagnosis not present

## 2019-09-14 DIAGNOSIS — H353221 Exudative age-related macular degeneration, left eye, with active choroidal neovascularization: Secondary | ICD-10-CM | POA: Diagnosis not present

## 2019-09-14 DIAGNOSIS — H31091 Other chorioretinal scars, right eye: Secondary | ICD-10-CM | POA: Diagnosis not present

## 2019-09-14 DIAGNOSIS — H353213 Exudative age-related macular degeneration, right eye, with inactive scar: Secondary | ICD-10-CM | POA: Diagnosis not present

## 2019-09-14 NOTE — Telephone Encounter (Signed)
Please advise on a new ointment

## 2019-09-15 MED ORDER — TACROLIMUS 0.1 % EX OINT: TOPICAL_OINTMENT | Freq: Two times a day (BID) | CUTANEOUS | 0 refills | Status: DC

## 2019-09-15 NOTE — Telephone Encounter (Signed)
I guess we can try tacrolimus ointment BID PRN itching. I think Georga Hacking was the only thing that worked, however. I think Obie Dredge said there was a way to get it through the specialty pharmacy?    I believe I asked someone to reach out to [facility] yesterday about this. I will just send him an email.   Shon Hough, MD Allergy and [place] [facility]

## 2019-09-15 NOTE — Addendum Note (Signed)
Addended by: Valere Dross on: 09/15/2019 10:24 AM   Modules accepted: Orders

## 2019-09-15 NOTE — Telephone Encounter (Signed)
Noted.  I talked to Jenny Reichmann as well and it seems like a huge hassle to get it approved for Medicare.  Salvatore Marvel, MD Allergy and [place] [facility] [place]

## 2019-09-15 NOTE — Telephone Encounter (Signed)
Tacrolimus sent into [facility] pharmacy to replace Eucrisa. Patient was notified and made aware of usage.

## 2019-09-17 ENCOUNTER — Ambulatory Visit (INDEPENDENT_AMBULATORY_CARE_PROVIDER_SITE_OTHER): Payer: Medicare Other

## 2019-09-17 DIAGNOSIS — J309 Allergic rhinitis, unspecified: Secondary | ICD-10-CM

## 2019-09-20 ENCOUNTER — Telehealth: Payer: Self-pay | Admitting: *Deleted

## 2019-09-20 NOTE — Telephone Encounter (Signed)
PA has been filled out for Tacrolimus and has been placed on Dr. Gillermina Hu desk for him to sign tomorrow and faxed to [facility] for approval or denial.

## 2019-09-21 DIAGNOSIS — H524 Presbyopia: Secondary | ICD-10-CM | POA: Diagnosis not present

## 2019-09-21 DIAGNOSIS — Z7984 Long term (current) use of oral hypoglycemic drugs: Secondary | ICD-10-CM | POA: Diagnosis not present

## 2019-09-21 DIAGNOSIS — H353213 Exudative age-related macular degeneration, right eye, with inactive scar: Secondary | ICD-10-CM | POA: Diagnosis not present

## 2019-09-21 DIAGNOSIS — E119 Type 2 diabetes mellitus without complications: Secondary | ICD-10-CM | POA: Diagnosis not present

## 2019-09-21 DIAGNOSIS — H353222 Exudative age-related macular degeneration, left eye, with inactive choroidal neovascularization: Secondary | ICD-10-CM | POA: Diagnosis not present

## 2019-09-21 NOTE — Telephone Encounter (Signed)
PA has been signed and faxed to [facility] at [phone] and is currently pending approval or denial.

## 2019-09-21 NOTE — Telephone Encounter (Signed)
PA for Tacrolimus has been approved. PA form has been faxed to patient's pharmacy, labeled, and placed in bulk scanning.

## 2019-09-22 ENCOUNTER — Ambulatory Visit (INDEPENDENT_AMBULATORY_CARE_PROVIDER_SITE_OTHER): Payer: Medicare Other

## 2019-09-22 DIAGNOSIS — Z85820 Personal history of malignant melanoma of skin: Secondary | ICD-10-CM | POA: Diagnosis not present

## 2019-09-22 DIAGNOSIS — J309 Allergic rhinitis, unspecified: Secondary | ICD-10-CM

## 2019-09-22 DIAGNOSIS — D225 Melanocytic nevi of trunk: Secondary | ICD-10-CM | POA: Diagnosis not present

## 2019-09-22 DIAGNOSIS — L821 Other seborrheic keratosis: Secondary | ICD-10-CM | POA: Diagnosis not present

## 2019-09-22 DIAGNOSIS — C44519 Basal cell carcinoma of skin of other part of trunk: Secondary | ICD-10-CM | POA: Diagnosis not present

## 2019-09-22 DIAGNOSIS — Z85828 Personal history of other malignant neoplasm of skin: Secondary | ICD-10-CM | POA: Diagnosis not present

## 2019-09-29 ENCOUNTER — Ambulatory Visit (INDEPENDENT_AMBULATORY_CARE_PROVIDER_SITE_OTHER): Payer: Medicare Other

## 2019-09-29 DIAGNOSIS — J309 Allergic rhinitis, unspecified: Secondary | ICD-10-CM | POA: Diagnosis not present

## 2019-09-30 DIAGNOSIS — I10 Essential (primary) hypertension: Secondary | ICD-10-CM | POA: Diagnosis not present

## 2019-09-30 DIAGNOSIS — Z024 Encounter for examination for driving license: Secondary | ICD-10-CM | POA: Diagnosis not present

## 2019-09-30 DIAGNOSIS — E7849 Other hyperlipidemia: Secondary | ICD-10-CM | POA: Diagnosis not present

## 2019-09-30 DIAGNOSIS — E6609 Other obesity due to excess calories: Secondary | ICD-10-CM | POA: Diagnosis not present

## 2019-09-30 DIAGNOSIS — Z6835 Body mass index (BMI) 35.0-35.9, adult: Secondary | ICD-10-CM | POA: Diagnosis not present

## 2019-09-30 DIAGNOSIS — E1129 Type 2 diabetes mellitus with other diabetic kidney complication: Secondary | ICD-10-CM | POA: Diagnosis not present

## 2019-10-04 DIAGNOSIS — E1140 Type 2 diabetes mellitus with diabetic neuropathy, unspecified: Secondary | ICD-10-CM | POA: Diagnosis not present

## 2019-10-04 DIAGNOSIS — E1151 Type 2 diabetes mellitus with diabetic peripheral angiopathy without gangrene: Secondary | ICD-10-CM | POA: Diagnosis not present

## 2019-10-06 ENCOUNTER — Ambulatory Visit (INDEPENDENT_AMBULATORY_CARE_PROVIDER_SITE_OTHER): Payer: Medicare Other

## 2019-10-06 DIAGNOSIS — J309 Allergic rhinitis, unspecified: Secondary | ICD-10-CM | POA: Diagnosis not present

## 2019-10-07 DIAGNOSIS — M79671 Pain in right foot: Secondary | ICD-10-CM | POA: Diagnosis not present

## 2019-10-07 DIAGNOSIS — M79675 Pain in left toe(s): Secondary | ICD-10-CM | POA: Diagnosis not present

## 2019-10-07 DIAGNOSIS — S92354D Nondisplaced fracture of fifth metatarsal bone, right foot, subsequent encounter for fracture with routine healing: Secondary | ICD-10-CM | POA: Diagnosis not present

## 2019-10-08 ENCOUNTER — Telehealth: Payer: Self-pay | Admitting: Radiology

## 2019-10-08 ENCOUNTER — Ambulatory Visit: Payer: Medicare Other | Admitting: Cardiology

## 2019-10-08 ENCOUNTER — Other Ambulatory Visit: Payer: Self-pay

## 2019-10-08 ENCOUNTER — Encounter: Payer: Self-pay | Admitting: Cardiology

## 2019-10-08 VITALS — BP 142/64 | HR 42 | Temp 97.5°F | Ht 72.0 in | Wt 251.0 lb

## 2019-10-08 DIAGNOSIS — R001 Bradycardia, unspecified: Secondary | ICD-10-CM | POA: Diagnosis not present

## 2019-10-08 DIAGNOSIS — Z9861 Coronary angioplasty status: Secondary | ICD-10-CM | POA: Diagnosis not present

## 2019-10-08 DIAGNOSIS — E785 Hyperlipidemia, unspecified: Secondary | ICD-10-CM

## 2019-10-08 DIAGNOSIS — I10 Essential (primary) hypertension: Secondary | ICD-10-CM | POA: Diagnosis not present

## 2019-10-08 DIAGNOSIS — I878 Other specified disorders of veins: Secondary | ICD-10-CM

## 2019-10-08 DIAGNOSIS — I208 Other forms of angina pectoris: Secondary | ICD-10-CM

## 2019-10-08 DIAGNOSIS — I2510 Atherosclerotic heart disease of native coronary artery without angina pectoris: Secondary | ICD-10-CM

## 2019-10-08 MED ORDER — PREDNISONE 10 MG PO TABS: 10.0000 mg | ORAL_TABLET | Freq: Every day | ORAL | 0 refills | Status: DC | PRN

## 2019-10-08 MED ORDER — HYDROCHLOROTHIAZIDE 25 MG PO TABS: 25.0000 mg | ORAL_TABLET | Freq: Every day | ORAL | 3 refills | Status: DC

## 2019-10-08 NOTE — Telephone Encounter (Signed)
Enrolled patient for a 3 day Zio monitor to be mailed to patients home around 12/08/19

## 2019-10-08 NOTE — Patient Instructions (Signed)
Medication Instructions:  STOP TAKING METOPROLOL  START TAKING HYDROCHLOROTHIAZIDE 25 MG ONE TABLET DAILY    *If you need a refill on your cardiac medications before your next appointment, please call your pharmacy   Lab Work: NOT NEEDED   Testing/Procedures: WILL NEED TO DO  2 MONTHS -MAY 2021-- Your physician has recommended that you wear a 3 DAY ZIO-PATCH monitor. The Zio patch cardiac monitor continuously records heart rhythm data for up to 14 days, this is for patients being evaluated for multiple types heart rhythms. For the first 24 hours post application, please avoid getting the Zio monitor wet in the shower or by excessive sweating during exercise. After that, feel free to carry on with regular activities. Keep soaps and lotions away from the ZIO XT Patch.  This will be mailed to you, please expect 7-10 days to receive.  [employer] location - [place], [address].         Follow-Up: At [facility], you and your health needs are our priority.  As part of our continuing mission to provide you with exceptional heart care, we have created designated Provider Care Teams.  These Care Teams include your primary Cardiologist (physician) and Advanced Practice Providers (APPs -  Physician Assistants and Nurse Practitioners) who all work together to provide you with the care you need, when you need it.      Your next appointment:   5 month(s)  The format for your next appointment:   In Person  Provider:   Glenetta Hew, MD   Other Instructions  WEAR  SUPPORT SOCKS DURING THE DAY    ZIO XT- Long Term Monitor Instructions - MAY 2021  Your physician has requested you wear your ZIO patch monitor____3___days.   This is a single patch monitor.  [employer] supplies one patch monitor per enrollment.  Additional stickers are not available.   Please do not apply patch if you will be having a Nuclear Stress Test, Echocardiogram, Cardiac CT, MRI, or Chest Xray during the time  frame you would be wearing the monitor. The patch cannot be worn during these tests.  You cannot remove and re-apply the ZIO XT patch monitor.   Your ZIO patch monitor will be sent USPS Priority mail from [facility] directly to your home address. The monitor may also be mailed to a PO BOX if home delivery is not available.   It may take 3-5 days to receive your monitor after you have been enrolled.   Once you have received you monitor, please review enclosed instructions.  Your monitor has already been registered assigning a specific monitor serial # to you.   Applying the monitor   Shave hair from upper left chest.   Hold abrader disc by orange tab.  Rub abrader in 40 strokes over left upper chest as indicated in your monitor instructions.   Clean area with 4 enclosed alcohol pads .  Use all pads to assure are is cleaned thoroughly.  Let dry.   Apply patch as indicated in monitor instructions.  Patch will be place under collarbone on left side of chest with arrow pointing upward.   Rub patch adhesive wings for 2 minutes.Remove white label marked "1".  Remove white label marked "2".  Rub patch adhesive wings for 2 additional minutes.   While looking in a mirror, press and release button in center of patch.  A small green light will flash 3-4 times .  This will be your only indicator  the monitor has been turned on.     Do not shower for the first 24 hours.  You may shower after the first 24 hours.   Press button if you feel a symptom. You will hear a small click.  Record Date, Time and Symptom in the Patient Log Book.   When you are ready to remove patch, follow instructions on last 2 pages of Patient Log Book.  Stick patch monitor onto last page of Patient Log Book.   Place Patient Log Book in [place] box.  Use locking tab on box and tape box closed securely.  The [employer] has [employer] on it.  Please place in mailbox as soon as possible.  Your physician should have  your test results approximately 7 days after the monitor has been mailed back to [facility].   Call [place] at [phone] if you have questions regarding your ZIO XT patch monitor.  Call them immediately if you see an orange light blinking on your monitor.   If your monitor falls off in less than 4 days contact our Monitor department at [phone].  If your monitor becomes loose or falls off after 4 days call [employer] at [phone] for suggestions on securing your monitor.

## 2019-10-08 NOTE — Progress Notes (Signed)
Primary Care Provider: Redmond School, MD Cardiologist: No primary care provider on file. Electrophysiologist: None  Clinic Note: Chief Complaint  Patient presents with  . Follow-up    6 months.  . Coronary Artery Disease    HPI:    Arthur Lutz is a 79 y.o. male with a PMH below who presents today for 51-month follow-up of CAD and other factors.Marland Kitchen  He is a former patient of Dr. Chase Picket, who's CAD history dates back to 2009 had a "mild heart attack".   2009: He underwent PCI of the RCA & balloon PTCA to the ostium of the first diagonal branch.   In January 2013 he then had PCI of the Circumflex and Cutting Balloon PTCA of the a diagonal branch.  ? These sites were all patent in August 2013 repeat cardiac catheterization.   He had a negative Myoview in February 2014 as part of preoperative risk assessment.   Arthur Lutz was last seen in Sept 2020.  Noted slow heart rate and associated little exertional dyspnea.  No resting dyspnea.  No chest pain or pressure.  Felt that he is out of shape.  Difficult walking up a hill.  Also troubled by arthritis pain.  Decreased Toprol 1/2 tablet and d/c'd HCTZ.  Recent Hospitalizations:   N/A  Reviewed  CV studies:    The following studies were reviewed today: (if available, images/films reviewed: From Epic Chart or Care Everywhere) . None:  Interval History:   Arthur Lutz returns here today still noticing that his heart rate is little slow, and he does note some fatigue and dyspnea on exertion.  No chest pain or pressure.  He has some mild edema at the end of the day but no PND orthopnea. He recently hurt his right foot so it is in a boot, but he has had bilateral ankle swelling and that was the main reason why his wife wanted him to come in.  They are concerned that his symptoms were related to heart failure.  Other than the exertional dyspnea and edema, he denies any other cardiac symptoms.  He does indicate that he  does not sleep very well at night and has had to medicate himself with his as needed trazodone as well as melatonin.  If he can get good night sleep he feels better but usually does not have a good night sleep.  CV Review of Symptoms (Summary) Cardiovascular ROS: positive for - Fatigue and exercise intolerance.  Mild swelling in the end of the day. negative for - chest pain, irregular heartbeat, orthopnea, palpitations, paroxysmal nocturnal dyspnea, rapid heart rate, shortness of breath or Syncope/near syncope TIA/amaurosis fugax, claudication  The patient does not have symptoms concerning for COVID-19 infection (fever, chills, cough, or new shortness of breath).  The patient is practicing social distancing & Masking.   He had his Routine Annual Exam with his PCP and had labs checked and he was told that everything was "okay ".  This may be the labs from November, but he told me it was more recent than that.  REVIEWED OF SYSTEMS   Review of Systems  Constitutional: Positive for malaise/fatigue (Some exercise intolerance.). Negative for weight loss (Weight gain).  HENT: Negative for congestion and nosebleeds.   Eyes:       Vision seems stable  Respiratory: Negative for shortness of breath.   Gastrointestinal: Negative for blood in stool and melena.  Genitourinary: Negative for hematuria.  Musculoskeletal: Positive for joint pain. Negative  for falls.  Neurological: Negative for dizziness, weakness and headaches.  Psychiatric/Behavioral: Negative for memory loss. The patient has insomnia (Extreme difficulty getting to sleep.  Requires lots of medication.). The patient is not nervous/anxious.     I have reviewed and (if needed) personally updated the patient's problem list, medications, allergies, past medical and surgical history, social and family history.   PAST MEDICAL HISTORY   Past Medical History:  Diagnosis Date  . Allergic rhinitis   . Aortic valve sclerosis    Cause of murmur    . Arthritis of knee, left, needs total Knee in future with Dr. Wynelle Link  08/07/2011  . Bladder cancer ([place]) 11/2005  . CAD S/P percutaneous coronary angioplasty 08/07/2011   NEW 08/07/11 cutting balloon PTCA to 95% ostial diag. and 90%  proximal LCX.  prior cutting balloon and to RCA  & ostium of ostial diag; Last Cath 02/2012 - Patent Diag PTCA, RCA & Circ BMS stents. Normal EF & EDP; Myoview 2/14 no evidence of ischemia or infarction  . Diabetes mellitus type 2, controlled, with complications ([place])   . Diabetes mellitus without complication ([place])   . Dyslipidemia   . History of heart attack 11/2007   "mild; did not affect the heart muscle"; PCI to RCA, Cutting PTCA Diag ostium  . Hypertension   . Macular degeneration   . Macular degeneration of right eye    Now getting shots.   . Melanoma of skin ([place]) ~ 2005   "level 2; on my back"  . Myocardial infarction ([place]) 2012  . Osteoarthritis    Status post right knee arthroplasty, positive for staph infection. Pending right arthroplasty  . Portacath in place 08/11/2012  . Statin intolerance 08/07/2011  . Stroke [facility]) 1980's   "had facial strokes; 2; about a year apart; never did find what caused them"    PAST SURGICAL HISTORY   Past Surgical History:  Procedure Laterality Date  . BIOPSY  05/28/2019   Procedure: BIOPSY;  Surgeon: Rogene Houston, MD;  [facility];  Service: Endoscopy;;  cecum  . CARDIAC CATHETERIZATION  August '13   Widely patent RCA and LCx stents. Also patent PTCA site to D1, ~40-50% D2. Normal EF, normal EDP  . CATARACT EXTRACTION W/ INTRAOCULAR LENS IMPLANT  ~ 2010   right  . CATARACT EXTRACTION W/PHACO Left 11/21/2016   Procedure: CATARACT EXTRACTION PHACO AND INTRAOCULAR LENS PLACEMENT (IOC) CDE - 11.70 ;  Surgeon: Tonny Branch, MD;  Location: [facility] OR[facility];  Service: Ophthalmology;  Laterality: Left;  left  . cold cup removal bladder lesion  11/2005   "malignant"  . COLONOSCOPY N/A 12/09/2014   Procedure: COLONOSCOPY;   Surgeon: Rogene Houston, MD;  [facility];  Service: Endoscopy;  Laterality: N/A;  955 - moved to 8:30 - Ann to notify pt  . COLONOSCOPY WITH PROPOFOL N/A 05/28/2019   Procedure: COLONOSCOPY WITH PROPOFOL;  Surgeon: Rogene Houston, MD;  [facility];  Service: Endoscopy;  Laterality: N/A;  . CORONARY ANGIOPLASTY WITH STENT PLACEMENT  11/2007   Mid RCA - Driver BMS 3.5 mm x 15 mm; 2.0 mm Cutting Balloon PTCA - ostial D1  . CORONARY ANGIOPLASTY WITH STENT PLACEMENT  08/07/11   Abnormal TM Myoview - for exertional throat discomfort: Patent RCA stent, 90% circumflex -- Vision BMS 3.5 mm x 12 mm --> 4.2 mm. Ostial D2 95% -- 2.25 mm Cutting Balloon PTCA  . DOPPLER ECHOCARDIOGRAPHY  May 2009   Normal EF,  impaired relaxation, with sclerotic aortic valve. No stenosis.  . INGUINAL HERNIA REPAIR  ~ 1970   left  . KNEE ARTHROSCOPY  02/20/11   left  . LEFT HEART CATHETERIZATION WITH CORONARY ANGIOGRAM N/A 08/07/2011   Procedure: LEFT HEART CATHETERIZATION WITH CORONARY ANGIOGRAM;  Surgeon: Leonie Man, MD;  Location: [facility] CATH LAB;  Service: Cardiovascular;  Laterality: N/A;  . LEFT HEART CATHETERIZATION WITH CORONARY ANGIOGRAM N/A 03/06/2012   Procedure: LEFT HEART CATHETERIZATION WITH CORONARY ANGIOGRAM;  Surgeon: Leonie Man, MD;  Location: [facility] CATH LAB;  Service: Cardiovascular;  Laterality: N/A;  . NM MYOVIEW LTD  Jan. 31, 2014   Low risk, EF 49%, improved from prior. Small apical and apical septal defect, fixed likely artifact no skin or infarction.  Marland Kitchen PARTIAL KNEE ARTHROPLASTY  06/2003   right  . PERCUTANEOUS CORONARY STENT INTERVENTION (PCI-S)  08/07/2011   Procedure: PERCUTANEOUS CORONARY STENT INTERVENTION (PCI-S);  Surgeon: Leonie Man, MD;  Location: [facility] CATH LAB;  Service: Cardiovascular;;  . POLYPECTOMY  05/28/2019   Procedure: POLYPECTOMY;  Surgeon: Rogene Houston, MD;  [facility];  Service: Endoscopy;;  colon  . TONSILLECTOMY     "as a child"  .  TOTAL KNEE ARTHROPLASTY  11/2003   right x2 and one on left- Total of 3.  . TOTAL KNEE ARTHROPLASTY Left 09/28/2012   Procedure: TOTAL KNEE ARTHROPLASTY;  Surgeon: Gearlean Alf, MD;  Location: [facility];  Service: Orthopedics;  Laterality: Left;    MEDICATIONS/ALLERGIES   Current Meds  Medication Sig  . acetaminophen (TYLENOL) 650 MG CR tablet Take 650 mg by mouth every 8 (eight) hours as needed for pain.  Marland Kitchen amLODipine (NORVASC) 10 MG tablet TAKE 1 TABLET(10 MG) BY MOUTH DAILY  . Ascorbic Acid (VITAMIN C) 1000 MG tablet Take 1,000 mg by mouth daily.  . Cholecalciferol (VITAMIN D) 50 MCG (2000 UT) tablet Take 2,000 Units by mouth daily.  . clobetasol (TEMOVATE) 0.05 % external solution Apply 1 application topically daily as needed (irritation).   . Coenzyme Q10 (CO Q-10) 100 MG CAPS Take 300 capsules by mouth 3 (three) times daily. (Patient taking differently: Take 100 capsules by mouth daily. )  . cyclobenzaprine (FLEXERIL) 10 MG tablet Take 1 tablet (10 mg total) by mouth 2 (two) times daily as needed for muscle spasms.  Marland Kitchen EPINEPHrine 0.3 mg/0.3 mL IJ SOAJ injection Inject 0.3 mg into the muscle as needed for anaphylaxis.   Marland Kitchen EUCRISA 2 % OINT Apply 1 application topically 2 (two) times daily as needed.  . ezetimibe (ZETIA) 10 MG tablet TAKE 1 TABLET(10 MG) BY MOUTH DAILY (Patient taking differently: Take 10 mg by mouth daily. )  . Fluticasone Propionate (XHANCE) 93 MCG/ACT EXHU Place 2 application into the nose 2 (two) times daily.  Marland Kitchen lisinopril (ZESTRIL) 40 MG tablet Take 0.5 tablets (20 mg total) by mouth daily.  Marland Kitchen loperamide (IMODIUM) 2 MG capsule Take 1 capsule (2 mg total) by mouth 4 (four) times daily as needed for diarrhea or loose stools.  . magnesium gluconate (MAGONATE) 500 MG tablet Take 500 mg by mouth daily.  . metFORMIN (GLUCOPHAGE) 500 MG tablet Take 500 mg by mouth daily with breakfast.   . Multiple Vitamins-Minerals (PRESERVISION AREDS PO) Take 1 capsule by mouth 2 (two)  times daily.   Marland Kitchen nystatin-triamcinolone (MYCOLOG II) cream Apply 1 application topically 2 (two) times daily as needed (dry skin).   . predniSONE (DELTASONE) 10 MG tablet Take 1 tablet (10 mg  total) by mouth daily as needed (arthritis). 3,3, 2, 1 tabs  . simvastatin (ZOCOR) 10 MG tablet TAKE 1 TABLET BY MOUTH DAILY AT 6 PM (Patient taking differently: Take 10 mg by mouth daily at 6 PM. )  . tacrolimus (PROTOPIC) 0.1 % ointment Apply topically 2 (two) times daily.  . tamsulosin (FLOMAX) 0.4 MG CAPS capsule Take 0.4 mg by mouth daily as needed (prostate).   . triazolam (HALCION) 0.125 MG tablet Take 1.5 tablets (0.1875 mg total) by mouth at bedtime. Pt takes 1.5 tabs at bedtime (Patient taking differently: Take 0.25 mg by mouth at bedtime. )  . [DISCONTINUED] metoprolol succinate (TOPROL XL) 25 MG 24 hr tablet Take 0.5 tablets (12.5 mg total) by mouth daily.  . [DISCONTINUED] predniSONE (DELTASONE) 10 MG tablet Take 1 tablet (10 mg total) by mouth daily as needed (arthritis). 3,3, 2, 1 tabs    Allergies  Allergen Reactions  . Rosuvastatin Calcium Other (See Comments)    Myalgias, tired crestor -aches    SOCIAL HISTORY/FAMILY HISTORY   Reviewed in Epic:  Pertinent findings: None  OBJCTIVE -PE, EKG, labs   Wt Readings from Last 3 Encounters:  10/08/19 251 lb (113.9 kg)  09/08/19 242 lb (109.8 kg)  04/23/19 247 lb 12.8 oz (112.4 kg)    Physical Exam: BP (!) 142/64 (BP Location: Left Arm, Patient Position: Sitting, Cuff Size: Large)   Pulse (!) 42   Temp (!) 97.5 F (36.4 C)   Ht 6' (1.829 m)   Wt 251 lb (113.9 kg)   BMI 34.04 kg/m  Physical Exam  Constitutional: He is oriented to person, place, and time. He appears well-developed and well-nourished. No distress.  Moderately obese.  Well-groomed  HENT:  Head: Normocephalic and atraumatic.  Neck: No hepatojugular reflux and no JVD present. Carotid bruit is not present.  Cardiovascular: Regular rhythm and intact distal pulses.  Bradycardia present. PMI is not displaced. Exam reveals no gallop and no friction rub.  Murmur heard.  Medium-pitched harsh crescendo-decrescendo midsystolic murmur is present with a grade of 1/6 at the upper right sternal border radiating to the neck. Pulmonary/Chest: Effort normal. No respiratory distress. He has no wheezes. He has no rales.  Abdominal: Soft. Bowel sounds are normal. He exhibits no distension. There is no abdominal tenderness.  Obese.  No HSM  Musculoskeletal:        General: Edema (Trace pedal edema) present. Normal range of motion.     Cervical back: Normal range of motion and neck supple.  Neurological: He is alert and oriented to person, place, and time.  Skin:  Venous stasis dermatitis noted  Psychiatric: He has a normal mood and affect. His behavior is normal. Judgment and thought content normal.  Vitals reviewed.    Adult ECG Report  Rate: 42;  Rhythm: sinus bradycardia, sinus arrhythmia and Incomplete RBBB.  Normal axis, intervals and durations.  No claudication  Narrative Interpretation: Stable more bradycardic  Recent Labs: November 2020: TC 145, TG 127, HDL 44, LDL 78.  A1c 6.6.Cr 0.81.  K+ 3.9.  Lab Results  Component Value Date   CREATININE 0.82 03/30/2019   BUN 15 03/30/2019   NA 141 03/30/2019   K 3.6 03/30/2019   CL 100 03/30/2019   CO2 29 03/30/2019   Lab Results  Component Value Date   TSH 2.14 11/24/2015    ASSESSMENT/PLAN    Problem List Items Addressed This Visit    CAD S/P percutaneous coronary angioplasty: 08/07/11 cutting balloon PTCA to 95%  ostial diag. and 90%  proximal LCX.  prior cutting balloon to RCA  & ostium of ostial diag., 2009  - Primary (Chronic)    Stable from a cardiac standpoint.  No active angina.  I think the fatigue and exercise intolerance is probably still related to his bradycardia.    Plan: DC metoprolol (wean off). Continue amlodipine and lisinopril. On simvastatin plus Zetia.  (On lower dose of simvastatin  because of amlodipine) Not currently on antiplatelet agent because of history of macular bleeding.      Relevant Medications   hydrochlorothiazide (HYDRODIURIL) 25 MG tablet   Other Relevant Orders   EKG 12-Lead   LONG TERM MONITOR (3-14 DAYS)   Essential hypertension (Chronic)   Relevant Medications   hydrochlorothiazide (HYDRODIURIL) 25 MG tablet   Hyperlipemia, intolerant to statins. Goal LDL < 70 (Chronic)    He is doing okay on simvastatin.  He gets with switched him from a different statin before.  I would like to have him on a more potent statin or a higher dose, but we cannot go up too high because of amlodipine.  As of November, his LDL was 78 on Zetia and simvastatin.  Would like to see another follow-up set of labs which show, because it seems to be worse since we had to back off on simvastatin with increasing amlodipine.  Need to figure out another way to treat which would likely be switching to atorvastatin at same dose which is more potent.      Relevant Medications   hydrochlorothiazide (HYDRODIURIL) 25 MG tablet   Chronic stable angina (HCC) - negative Myoview & no new Dz on Cath (Chronic)    Relatively asymptomatic now, but will get had to stop Toprol because of bradycardia.  Continue amlodipine.  Not able to tolerate Imdur.      Relevant Medications   hydrochlorothiazide (HYDRODIURIL) 25 MG tablet   predniSONE (DELTASONE) 10 MG tablet   Other Relevant Orders   EKG 12-Lead   LONG TERM MONITOR (3-14 DAYS)   Venous stasis of both lower extremities (Chronic)    Stable.  This is not anything really new.  I would have him reluctant to use a diuretic, but since we are stopping metoprolol, I would actually have him start the HCTZ that I thought he is already taking.  Plan: Start HCTZ 12.5 mg daily.  Also recommend support stockings.      Relevant Medications   hydrochlorothiazide (HYDRODIURIL) 25 MG tablet   Sinus bradycardia (Chronic)    He is still bradycardic  despite having reduced his Toprol.  I think this point we can actually stop it.  I would like to see if he has underlying chronotropic incompetence or if this is all because of the beta-blocker which is at a low dose.  Plan: Stop metoprolol by weaning off, then wait several weeks before having him wear a 3-day Zio patch monitor to assess heart rate at baseline as well as response to activity.  I want him to record his activity during this timeframe.      Relevant Medications   hydrochlorothiazide (HYDRODIURIL) 25 MG tablet   Other Relevant Orders   EKG 12-Lead   LONG TERM MONITOR (3-14 DAYS)       COVID-19 Education: The signs and symptoms of COVID-19 were discussed with the patient and how to seek care for testing (follow up with PCP or arrange E-visit).   The importance of social distancing was discussed today.   I spent a  total of 22 minutes with the patient. >  50% of the time was spent in direct patient consultation.  Additional time spent with chart review  / charting (studies, outside notes, etc): 8 Total Time: 30 min   Current medicines are reviewed at length with the patient today.  (+/- concerns) n/a   Patient Instructions / Medication Changes & Studies & Tests Ordered   Patient Instructions  Medication Instructions:  STOP TAKING METOPROLOL  START TAKING HYDROCHLOROTHIAZIDE 25 MG ONE TABLET DAILY    *If you need a refill on your cardiac medications before your next appointment, please call your pharmacy   Lab Work: NOT NEEDED   Testing/Procedures: WILL NEED TO DO  2 MONTHS -MAY 2021-- Your physician has recommended that you wear a 3 DAY ZIO-PATCH monitor. The Zio patch cardiac monitor continuously records heart rhythm data for up to 14 days, this is for patients being evaluated for multiple types heart rhythms. For the first 24 hours post application, please avoid getting the Zio monitor wet in the shower or by excessive sweating during exercise. After that, feel  free to carry on with regular activities. Keep soaps and lotions away from the ZIO XT Patch.  This will be mailed to you, please expect 7-10 days to receive.  [employer] location - [place], [address].         Follow-Up: At [facility], you and your health needs are our priority.  As part of our continuing mission to provide you with exceptional heart care, we have created designated Provider Care Teams.  These Care Teams include your primary Cardiologist (physician) and Advanced Practice Providers (APPs -  Physician Assistants and Nurse Practitioners) who all work together to provide you with the care you need, when you need it.      Your next appointment:   5 month(s)  The format for your next appointment:   In Person  Provider:   Glenetta Hew, MD   Other Instructions  WEAR  SUPPORT SOCKS DURING THE DAY    ZIO XT- Long Term Monitor Instructions - MAY 2021  Your physician has requested you wear your ZIO patch monitor____3___days.   This is a single patch monitor.  [employer] supplies one patch monitor per enrollment.  Additional stickers are not available.   Please do not apply patch if you will be having a Nuclear Stress Test, Echocardiogram, Cardiac CT, MRI, or Chest Xray during the time frame you would be wearing the monitor. The patch cannot be worn during these tests.  You cannot remove and re-apply the ZIO XT patch monitor.   Your ZIO patch monitor will be sent USPS Priority mail from [facility] directly to your home address. The monitor may also be mailed to a PO BOX if home delivery is not available.   It may take 3-5 days to receive your monitor after you have been enrolled.   Once you have received you monitor, please review enclosed instructions.  Your monitor has already been registered assigning a specific monitor serial # to you.   Applying the monitor   Shave hair from upper left chest.   Hold abrader disc by orange tab.  Rub abrader in 40  strokes over left upper chest as indicated in your monitor instructions.   Clean area with 4 enclosed alcohol pads .  Use all pads to assure are is cleaned thoroughly.  Let dry.   Apply patch as indicated in monitor instructions.  Patch will be  place under collarbone on left side of chest with arrow pointing upward.   Rub patch adhesive wings for 2 minutes.Remove white label marked "1".  Remove white label marked "2".  Rub patch adhesive wings for 2 additional minutes.   While looking in a mirror, press and release button in center of patch.  A small green light will flash 3-4 times .  This will be your only indicator the monitor has been turned on.     Do not shower for the first 24 hours.  You may shower after the first 24 hours.   Press button if you feel a symptom. You will hear a small click.  Record Date, Time and Symptom in the Patient Log Book.   When you are ready to remove patch, follow instructions on last 2 pages of Patient Log Book.  Stick patch monitor onto last page of Patient Log Book.   Place Patient Log Book in [place] box.  Use locking tab on box and tape box closed securely.  The [employer] and [employer] has [employer] on it.  Please place in mailbox as soon as possible.  Your physician should have your test results approximately 7 days after the monitor has been mailed back to [facility].   Call [place] at [phone] if you have questions regarding your ZIO XT patch monitor.  Call them immediately if you see an orange light blinking on your monitor.   If your monitor falls off in less than 4 days contact our Monitor department at [phone].  If your monitor becomes loose or falls off after 4 days call [employer] at [phone] for suggestions on securing your monitor.        Studies Ordered:   Orders Placed This Encounter  Procedures  . LONG TERM MONITOR (3-14 DAYS)  . EKG 12-Lead     Glenetta Hew, M.D., M.S. Interventional  Cardiologist   Pager # [phone] Phone # [phone] [address] [place], [place] [address]   Thank you for choosing Heartcare at [facility]!!

## 2019-10-11 ENCOUNTER — Encounter: Payer: Self-pay | Admitting: Cardiology

## 2019-10-11 NOTE — Assessment & Plan Note (Signed)
Stable.  This is not anything really new.  I would have him reluctant to use a diuretic, but since we are stopping metoprolol, I would actually have him start the HCTZ that I thought he is already taking.  Plan: Start HCTZ 12.5 mg daily.  Also recommend support stockings.

## 2019-10-11 NOTE — Assessment & Plan Note (Signed)
He is doing okay on simvastatin.  He gets with switched him from a different statin before.  I would like to have him on a more potent statin or a higher dose, but we cannot go up too high because of amlodipine.  As of November, his LDL was 78 on Zetia and simvastatin.  Would like to see another follow-up set of labs which show, because it seems to be worse since we had to back off on simvastatin with increasing amlodipine.  Need to figure out another way to treat which would likely be switching to atorvastatin at same dose which is more potent.

## 2019-10-11 NOTE — Assessment & Plan Note (Signed)
Stable from a cardiac standpoint.  No active angina.  I think the fatigue and exercise intolerance is probably still related to his bradycardia.    Plan: DC metoprolol (wean off). Continue amlodipine and lisinopril. On simvastatin plus Zetia.  (On lower dose of simvastatin because of amlodipine) Not currently on antiplatelet agent because of history of macular bleeding.

## 2019-10-11 NOTE — Assessment & Plan Note (Signed)
Relatively asymptomatic now, but will get had to stop Toprol because of bradycardia.  Continue amlodipine.  Not able to tolerate Imdur.

## 2019-10-11 NOTE — Assessment & Plan Note (Signed)
He is still bradycardic despite having reduced his Toprol.  I think this point we can actually stop it.  I would like to see if he has underlying chronotropic incompetence or if this is all because of the beta-blocker which is at a low dose.  Plan: Stop metoprolol by weaning off, then wait several weeks before having him wear a 3-day Zio patch monitor to assess heart rate at baseline as well as response to activity.  I want him to record his activity during this timeframe.

## 2019-10-12 DIAGNOSIS — H31091 Other chorioretinal scars, right eye: Secondary | ICD-10-CM | POA: Diagnosis not present

## 2019-10-12 DIAGNOSIS — H353221 Exudative age-related macular degeneration, left eye, with active choroidal neovascularization: Secondary | ICD-10-CM | POA: Diagnosis not present

## 2019-10-12 DIAGNOSIS — H353213 Exudative age-related macular degeneration, right eye, with inactive scar: Secondary | ICD-10-CM | POA: Diagnosis not present

## 2019-10-12 DIAGNOSIS — H35422 Microcystoid degeneration of retina, left eye: Secondary | ICD-10-CM | POA: Diagnosis not present

## 2019-10-13 ENCOUNTER — Ambulatory Visit (INDEPENDENT_AMBULATORY_CARE_PROVIDER_SITE_OTHER): Payer: Medicare Other

## 2019-10-13 DIAGNOSIS — J309 Allergic rhinitis, unspecified: Secondary | ICD-10-CM

## 2019-10-14 DIAGNOSIS — C44519 Basal cell carcinoma of skin of other part of trunk: Secondary | ICD-10-CM | POA: Diagnosis not present

## 2019-10-14 DIAGNOSIS — L821 Other seborrheic keratosis: Secondary | ICD-10-CM | POA: Diagnosis not present

## 2019-10-14 DIAGNOSIS — L814 Other melanin hyperpigmentation: Secondary | ICD-10-CM | POA: Diagnosis not present

## 2019-10-20 ENCOUNTER — Ambulatory Visit (INDEPENDENT_AMBULATORY_CARE_PROVIDER_SITE_OTHER): Payer: Medicare Other

## 2019-10-20 DIAGNOSIS — J309 Allergic rhinitis, unspecified: Secondary | ICD-10-CM | POA: Diagnosis not present

## 2019-10-27 ENCOUNTER — Other Ambulatory Visit: Payer: Self-pay

## 2019-10-27 ENCOUNTER — Ambulatory Visit (INDEPENDENT_AMBULATORY_CARE_PROVIDER_SITE_OTHER): Payer: Medicare Other | Admitting: Allergy & Immunology

## 2019-10-27 ENCOUNTER — Other Ambulatory Visit: Payer: Self-pay | Admitting: Allergy & Immunology

## 2019-10-27 ENCOUNTER — Encounter: Payer: Self-pay | Admitting: Allergy & Immunology

## 2019-10-27 VITALS — BP 142/66 | HR 50 | Temp 98.4°F | Resp 20

## 2019-10-27 DIAGNOSIS — J3089 Other allergic rhinitis: Secondary | ICD-10-CM

## 2019-10-27 DIAGNOSIS — L299 Pruritus, unspecified: Secondary | ICD-10-CM | POA: Diagnosis not present

## 2019-10-27 DIAGNOSIS — L409 Psoriasis, unspecified: Secondary | ICD-10-CM

## 2019-10-27 DIAGNOSIS — J302 Other seasonal allergic rhinitis: Secondary | ICD-10-CM | POA: Diagnosis not present

## 2019-10-27 MED ORDER — BUDESONIDE 0.5 MG/2ML IN SUSP: RESPIRATORY_TRACT | 0 refills | Status: DC

## 2019-10-27 MED ORDER — AZELASTINE HCL 0.1 % NA SOLN: 2.0000 | Freq: Two times a day (BID) | NASAL | 5 refills | Status: DC

## 2019-10-27 NOTE — Patient Instructions (Addendum)
1. Chronic rhinitis (dust mites, mold, cockroach) - Continue with allergy shots at the same schedule. - Hold the [facility] for now - Continue with: Coricidin as needed and Astelin (azelastine) 2 sprays per nostril up to twice daily  - Start taking budesonide 0.5 mg EVERY morning mixed with your navage saline. - Give [place] an update in a couple of weeks.   2. Itching -Testing was negative to the entire panel. -It seems that the change to dye free and fragrance free soaps and detergents has helped. -Continue with moisturizing twice daily. -Continue with Eucrisa twice daily as needed for the itching.  -Plan to continue with Xyzal 5mg  to see if this helps with the itching.   3. Return in about 3 months (around 01/26/2020). This can be an in-person, a virtual Webex or a telephone follow up visit.   Please inform us of any Emergency Department visits, hospitalizations, or changes in symptoms. Call us before going to the ED for breathing or allergy symptoms since we might be able to fit you in for a sick visit. Feel free to contact us anytime with any questions, problems, or concerns.  It was a pleasure to see you again today!  Websites that have reliable patient information: 1. American Academy of Asthma, Allergy, and Immunology: www.aaaai.org 2. Food Allergy Research and Education (FARE): foodallergy.org 3. Mothers of Asthmatics: http://www.asthmacommunitynetwork.org 4. American College of Allergy, Asthma, and Immunology: www.acaai.org   COVID-19 Vaccine Information can be found at: [email or web address].[email or web address].uk For questions related to vaccine distribution or appointments, please email [email or web address] [email or web address] or call [phone].     "Like" [place] on Facebook and Instagram for our latest updates!       HAPPY SPRING!  Make sure you are registered to vote! If you have moved or changed any of your contact information, you will need to get this  updated before voting!  In some cases, you MAY be able to register to vote online: [email or web address]

## 2019-10-27 NOTE — Progress Notes (Signed)
FOLLOW UP  Date of Service/Encounter:  10/27/19   Assessment:   Seasonal and perennial allergic rhinitis (dust mites, mold, cockroach) - on allergen immunotherapy  Itching  Psoriasis  Mr. Arthur Lutz follows up for skin testing due to his recent development of pruritus.  He request the entire panel if we do discuss the terrible positive predictive value of skin testing.  He wants to go through with it anyway.  Thankfully, the entire panel is negative.  Therefore, he does not have any allergies to any of these foods.  Overall, his itching has improved since making the changes to his laundry detergent and adding on a different antihistamine.  He does report that he gets congestion midday pretty much every day.  He is only using the [facility] a few times a week, which does help with his midday congestion.  He is not using it more often because he does not want to pay the full price for the [facility].  He has never tried budesonide nasal rinses.  He has used a number of other nasal sprays without improvement.  Plan/Recommendations:   1. Chronic rhinitis (dust mites, mold, cockroach) - Continue with allergy shots at the same schedule. - Hold the [facility] for now - Continue with: Coricidin as needed and Astelin (azelastine) 2 sprays per nostril up to twice daily  - Start taking budesonide 0.5 mg EVERY morning mixed with your navage saline. - Give [place] an update in a couple of weeks.   2. Itching -Testing was negative to the entire panel. -It seems that the change to dye free and fragrance free soaps and detergents has helped. -Continue with moisturizing twice daily. -Continue with Eucrisa twice daily as needed for the itching.  -Plan to continue with Xyzal 5mg  to see if this helps with the itching.   3. Return in about 3 months (around 01/26/2020). This can be an in-person, a virtual Webex or a telephone follow up visit.  Subjective:   Arthur Lutz is a 79 y.o. male presenting today for follow up  of  Chief Complaint  Patient presents with  . Allergy Testing    Arthur Lutz has a history of the following: Patient Active Problem List   Diagnosis Date Noted  . Psoriasis 04/23/2019  . Colitis 04/06/2019  . Diarrhea 04/06/2019  . Abdominal pain 03/29/2019  . Seasonal and perennial allergic rhinitis 01/06/2019  . Sinus bradycardia 08/29/2017  . Chronic stable angina ([place]) - negative Myoview & no new Dz on Cath 05/23/2015  . Insomnia due to stress 07/04/2014    Class: Chronic  . Venous stasis of both lower extremities 11/24/2013  . Abnormal biliary HIDA scan 09/15/2013  . Intestinal bacterial overgrowth 08/16/2013  . Bloating 08/16/2013  . Obesity (BMI 30.0-34.9) 01/30/2013  . Stiffness of joint, not elsewhere classified, lower leg 10/26/2012  . Weakness of left leg 10/26/2012  . Difficulty in walking 10/26/2012  . Postoperative anemia due to acute blood loss 09/29/2012  . OA (osteoarthritis) of knee 09/28/2012  . Portacath in place 08/11/2012  . CAD S/P percutaneous coronary angioplasty: 08/07/11 cutting balloon PTCA to 95% ostial diag. and 90%  proximal LCX.  prior cutting balloon to RCA  & ostium of ostial diag., 2009  08/07/2011  . Essential hypertension 08/07/2011  . DOE (dyspnea on exertion) 08/07/2011  . Diabetes mellitus type 2, controlled, with complications ([place]) 123456  . Hyperlipemia, intolerant to statins. Goal LDL < 70 08/07/2011  . Arthritis of knee, left, needs total Knee  in future with Dr. Wynelle Link  08/07/2011  . Statin intolerance 08/07/2011    History obtained from: chart review and patient.  Mr. Arthur Lutz is a 79 year old gentleman with a history of perennial and seasonal allergic rhinitis as well as psoriasis who presents for food allergy testing.  He was last seen in February 2021 for evaluation of new onset itching.  We recommended that he change to an unscented and dye free detergent.  We also recommended that he change to [facility] sensitive for his soap.   Recommend moisturizing after hot baths.  We added on Eucrisa and Xyzal to see if this provided any relief.  For his allergic rhinitis, we continued with Coricidin as needed and XHANCE as well as Astelin.  He also remains on allergy shots.  Since last visit, he has done well.  He would like to test for the entire food panel today.  Pruritus has gotten better with the change to unscented and undyed laundry detergent and soaps.  He estimates that it is about 75 to 80% better.  He has not developed any new rashes.  He continues to have psoriasis and uses topical clobetasol for that.  He has never been on a biologic for his psoriasis.  He does report congestion which occurs in the middle of the day.  This does not happen when he uses the [place].  He does not use the [facility] consistently because of the price.  He has never use budesonide nasal rinses.  He has used Flonase as well as Nasacort and Rhinocort without improvement.  He thinks he has been on Nasonex as well.  Otherwise, there have been no changes to his past medical history, surgical history, family history, or social history.    Review of Systems  Constitutional: Negative.  Negative for fever, malaise/fatigue and weight loss.  HENT: Positive for congestion. Negative for ear discharge and ear pain.   Eyes: Negative for pain, discharge and redness.  Respiratory: Negative for cough, sputum production, shortness of breath and wheezing.   Cardiovascular: Negative.  Negative for chest pain and palpitations.  Gastrointestinal: Negative for abdominal pain, constipation, diarrhea, heartburn, nausea and vomiting.  Skin: Positive for itching. Negative for rash.  Neurological: Negative for dizziness and headaches.  Endo/Heme/Allergies: Negative for environmental allergies. Does not bruise/bleed easily.       Objective:   Blood pressure (!) 142/66, pulse (!) 50, temperature 98.4 F (36.9 C), temperature source Temporal, resp. rate 20, SpO2 93  %. There is no height or weight on file to calculate BMI.   Physical Exam:  Physical Exam  Constitutional: He appears well-developed.  HENT:  Head: Normocephalic and atraumatic.  Right Ear: Tympanic membrane, external ear and ear canal normal.  Left Ear: Tympanic membrane, external ear and ear canal normal.  Nose: Mucosal edema and rhinorrhea present. No nasal deformity or septal deviation. No epistaxis. Right sinus exhibits no maxillary sinus tenderness and no frontal sinus tenderness. Left sinus exhibits no maxillary sinus tenderness and no frontal sinus tenderness.  Mouth/Throat: Uvula is midline and oropharynx is clear and moist. Mucous membranes are not pale and not dry.  Strings of clear mucus.  Eyes: Pupils are equal, round, and reactive to light. Conjunctivae and EOM are normal. Right eye exhibits no chemosis and no discharge. Left eye exhibits no chemosis and no discharge. Right conjunctiva is not injected. Left conjunctiva is not injected.  Cardiovascular: Normal rate, regular rhythm and normal heart sounds.  Respiratory: Effort normal and breath sounds normal. No  accessory muscle usage. No tachypnea. No respiratory distress. He has no wheezes. He has no rhonchi. He has no rales. He exhibits no tenderness.  Moving air well in all lung fields.  No increased work of breathing.  Lymphadenopathy:    He has no cervical adenopathy.  Neurological: He is alert.  Skin: No abrasion, no petechiae and no rash noted. Rash is not papular, not vesicular and not urticarial. No erythema. No pallor.  No obvious eczematous or urticarial lesions noted.  Psychiatric: He has a normal mood and affect.     Diagnostic studies:   Allergy Studies:    Food Adult Perc - 10/27/19 0900    Time Antigen Placed  0945    Allergen Manufacturer  Lavella Hammock    Location  Back    Number of allergen test  72     Control-buffer 50% Glycerol  Negative    Control-Histamine 1 mg/ml  2+    1. Peanut  Negative    2.  Soybean  Negative    3. Wheat  Negative    4. Sesame  Negative    5. Milk, cow  Negative    6. Egg White, Chicken  Negative    7. Casein  Negative    8. Shellfish Mix  Negative    9. Fish Mix  Negative    10. Cashew  Negative    11. Pecan Food  Negative    12. [employer]  Negative    13. Almond  Negative    14. Hazelnut  Negative    15. [place] nut  Negative    16. Coconut  Negative    17. Pistachio  Negative    18. Catfish  Negative    19. Bass  Negative    20. Trout  Negative    21. Tuna  Negative    22. Salmon  Negative    23. Flounder  Negative    24. Codfish  Negative    25. Shrimp  Negative    26. Crab  Negative    27. Lobster  Negative    28. Oyster  Negative    29. Scallops  Negative    30. Barley  Negative    31. Oat   Negative    32. Rye   Negative    33. Hops  Negative    34. Rice  Negative    35. Cottonseed  Negative    36. Saccharomyces Cerevisiae   Negative    37. Pork  Negative    38. [place] Meat  Negative    39. Chicken Meat  Negative    40. Beef  Negative    41. Lamb  Negative    42. Tomato  Negative    43. White Potato  Negative    44. Sweet Potato  Negative    45. Pea, Green/English  Negative    46. Navy Bean  Negative    47. Mushrooms  Negative    48. Avocado  Negative    49. Onion  Negative    50. Cabbage  Negative    51. Carrots  Negative    52. Celery  Negative    53. Corn  Negative    54. Cucumber  Negative    55. Grape (White seedless)  Negative    56. Orange   Negative    57. Banana  Negative    58. Apple  Negative    59. Peach  Negative    60. Strawberry  Negative    61. Cantaloupe  Negative    62. Watermelon  Negative    63. Pineapple  Negative    64. Chocolate/Cacao bean  Negative    65. Karaya Gum  Negative    66. Acacia (Arabic Gum)  Negative    67. Cinnamon  Negative    68. Nutmeg  Negative    69. Ginger  Negative    70. Garlic  Negative    71. Pepper, black  Negative    72. Mustard  Negative       Allergy  testing results were read and interpreted by myself, documented by clinical staff.      Salvatore Marvel, MD  [facility] [place] [place]

## 2019-10-28 DIAGNOSIS — M79671 Pain in right foot: Secondary | ICD-10-CM | POA: Diagnosis not present

## 2019-10-28 DIAGNOSIS — M79675 Pain in left toe(s): Secondary | ICD-10-CM | POA: Diagnosis not present

## 2019-10-28 DIAGNOSIS — S92354D Nondisplaced fracture of fifth metatarsal bone, right foot, subsequent encounter for fracture with routine healing: Secondary | ICD-10-CM | POA: Diagnosis not present

## 2019-11-03 ENCOUNTER — Ambulatory Visit: Payer: Self-pay

## 2019-11-03 DIAGNOSIS — J309 Allergic rhinitis, unspecified: Secondary | ICD-10-CM

## 2019-11-10 ENCOUNTER — Ambulatory Visit: Payer: Self-pay

## 2019-11-10 DIAGNOSIS — J309 Allergic rhinitis, unspecified: Secondary | ICD-10-CM

## 2019-11-15 DIAGNOSIS — J3089 Other allergic rhinitis: Secondary | ICD-10-CM | POA: Diagnosis not present

## 2019-11-15 NOTE — Progress Notes (Signed)
EXP 11/14/20

## 2019-11-17 ENCOUNTER — Ambulatory Visit (INDEPENDENT_AMBULATORY_CARE_PROVIDER_SITE_OTHER): Payer: Medicare Other

## 2019-11-17 DIAGNOSIS — J309 Allergic rhinitis, unspecified: Secondary | ICD-10-CM | POA: Diagnosis not present

## 2019-11-18 DIAGNOSIS — M79675 Pain in left toe(s): Secondary | ICD-10-CM | POA: Diagnosis not present

## 2019-11-18 DIAGNOSIS — M79671 Pain in right foot: Secondary | ICD-10-CM | POA: Diagnosis not present

## 2019-11-18 DIAGNOSIS — S92354D Nondisplaced fracture of fifth metatarsal bone, right foot, subsequent encounter for fracture with routine healing: Secondary | ICD-10-CM | POA: Diagnosis not present

## 2019-11-23 ENCOUNTER — Encounter (HOSPITAL_COMMUNITY): Payer: Medicare Other

## 2019-11-23 ENCOUNTER — Encounter (HOSPITAL_COMMUNITY): Admission: RE | Admit: 2019-11-23 | Payer: Medicare Other | Source: Ambulatory Visit

## 2019-11-23 DIAGNOSIS — Z452 Encounter for adjustment and management of vascular access device: Secondary | ICD-10-CM | POA: Insufficient documentation

## 2019-11-23 DIAGNOSIS — H353221 Exudative age-related macular degeneration, left eye, with active choroidal neovascularization: Secondary | ICD-10-CM | POA: Diagnosis not present

## 2019-11-23 DIAGNOSIS — H353213 Exudative age-related macular degeneration, right eye, with inactive scar: Secondary | ICD-10-CM | POA: Diagnosis not present

## 2019-11-24 ENCOUNTER — Other Ambulatory Visit: Payer: Self-pay

## 2019-11-24 ENCOUNTER — Ambulatory Visit (INDEPENDENT_AMBULATORY_CARE_PROVIDER_SITE_OTHER): Payer: Medicare Other

## 2019-11-24 ENCOUNTER — Encounter (HOSPITAL_COMMUNITY)
Admission: RE | Admit: 2019-11-24 | Discharge: 2019-11-24 | Disposition: A | Discharge status: Home / Self Care | Payer: Medicare Other | Source: Ambulatory Visit | Attending: Internal Medicine | Admitting: Internal Medicine

## 2019-11-24 DIAGNOSIS — J309 Allergic rhinitis, unspecified: Secondary | ICD-10-CM | POA: Diagnosis not present

## 2019-11-24 DIAGNOSIS — Z452 Encounter for adjustment and management of vascular access device: Secondary | ICD-10-CM | POA: Diagnosis not present

## 2019-11-24 MED ORDER — HEPARIN SOD (PORK) LOCK FLUSH 100 UNIT/ML IV SOLN
500.0000 [IU] | Freq: Once | INTRAVENOUS | Status: AC
  Administered 2019-11-24: 500 [IU] via INTRAVENOUS

## 2019-11-24 MED ORDER — HEPARIN SOD (PORK) LOCK FLUSH 100 UNIT/ML IV SOLN
INTRAVENOUS | Status: AC
  Filled 2019-11-24: qty 5

## 2019-12-03 ENCOUNTER — Ambulatory Visit (INDEPENDENT_AMBULATORY_CARE_PROVIDER_SITE_OTHER): Payer: Medicare Other

## 2019-12-03 ENCOUNTER — Other Ambulatory Visit: Payer: Self-pay

## 2019-12-03 ENCOUNTER — Ambulatory Visit
Admission: EM | Admit: 2019-12-03 | Discharge: 2019-12-03 | Disposition: A | Discharge status: Home / Self Care | Payer: Medicare Other

## 2019-12-03 DIAGNOSIS — J309 Allergic rhinitis, unspecified: Secondary | ICD-10-CM

## 2019-12-03 NOTE — Discharge Instructions (Addendum)

## 2019-12-06 ENCOUNTER — Ambulatory Visit
Admission: EM | Admit: 2019-12-06 | Discharge: 2019-12-06 | Disposition: A | Discharge status: Home / Self Care | Payer: Medicare Other | Attending: Emergency Medicine | Admitting: Emergency Medicine

## 2019-12-06 ENCOUNTER — Telehealth: Payer: Self-pay | Admitting: Cardiology

## 2019-12-06 ENCOUNTER — Encounter: Payer: Self-pay | Admitting: Emergency Medicine

## 2019-12-06 ENCOUNTER — Other Ambulatory Visit: Payer: Self-pay

## 2019-12-06 DIAGNOSIS — Z20822 Contact with and (suspected) exposure to covid-19: Secondary | ICD-10-CM | POA: Diagnosis not present

## 2019-12-06 NOTE — ED Triage Notes (Signed)
Seen provider

## 2019-12-06 NOTE — ED Provider Notes (Signed)
[place]   [id number] 12/06/19 Arrival Time: [id number]   CC: COVID symptoms  SUBJECTIVE: History from: patient.  Arthur Lutz is a 79 y.o. male who presents for COVID testing.  Sick exposure x 5 days to employee.   Denies aggravating or alleviating symptoms.  Denies previous COVID infection.   Denies fever, chills, fatigue, nasal congestion, rhinorrhea, sore throat, cough, SOB, wheezing, chest pain, nausea, vomiting, changes in bowel or bladder habits.    Did not received COVID vaccines  ROS: As per HPI.  All other pertinent ROS negative.     Past Medical History:  Diagnosis Date  . Allergic rhinitis   . Aortic valve sclerosis    Cause of murmur  . Arthritis of knee, left, needs total Knee in future with Dr. Wynelle Link  08/07/2011  . Bladder cancer ([place]) 11/2005  . CAD S/P percutaneous coronary angioplasty 08/07/2011   NEW 08/07/11 cutting balloon PTCA to 95% ostial diag. and 90%  proximal LCX.  prior cutting balloon and to RCA  & ostium of ostial diag; Last Cath 02/2012 - Patent Diag PTCA, RCA & Circ BMS stents. Normal EF & EDP; Myoview 2/14 no evidence of ischemia or infarction  . Diabetes mellitus type 2, controlled, with complications ([place])   . Diabetes mellitus without complication ([place])   . Dyslipidemia   . History of heart attack 11/2007   "mild; did not affect the heart muscle"; PCI to RCA, Cutting PTCA Diag ostium  . Hypertension   . Macular degeneration   . Macular degeneration of right eye    Now getting shots.   . Melanoma of skin ([place]) ~ 2005   "level 2; on my back"  . Myocardial infarction ([place]) 2012  . Osteoarthritis    Status post right knee arthroplasty, positive for staph infection. Pending right arthroplasty  . Portacath in place 08/11/2012  . Statin intolerance 08/07/2011  . Stroke [facility]) 1980's   "had facial strokes; 2; about a year apart; never did find what caused them"   Past Surgical History:  Procedure Laterality Date  . BIOPSY  05/28/2019   Procedure: BIOPSY;  Surgeon: Rogene Houston, MD;  Location: [facility];  Service: Endoscopy;;  cecum  . CARDIAC CATHETERIZATION  August '13   Widely patent RCA and LCx stents. Also patent PTCA site to D1, ~40-50% D2. Normal EF, normal EDP  . CATARACT EXTRACTION W/ INTRAOCULAR LENS IMPLANT  ~ 2010   right  . CATARACT EXTRACTION W/PHACO Left 11/21/2016   Procedure: CATARACT EXTRACTION PHACO AND INTRAOCULAR LENS PLACEMENT (IOC) CDE - 11.70 ;  Surgeon: Tonny Branch, MD;  Location: [facility] ORS;  Service: Ophthalmology;  Laterality: Left;  left  . cold cup removal bladder lesion  11/2005   "malignant"  . COLONOSCOPY N/A 12/09/2014   Procedure: COLONOSCOPY;  Surgeon: Rogene Houston, MD;  Location: [facility];  Service: Endoscopy;  Laterality: N/A;  955 - moved to 8:30 - Ann to notify pt  . COLONOSCOPY WITH PROPOFOL N/A 05/28/2019   Procedure: COLONOSCOPY WITH PROPOFOL;  Surgeon: Rogene Houston, MD;  [facility];  Service: Endoscopy;  Laterality: N/A;  . CORONARY ANGIOPLASTY WITH STENT PLACEMENT  11/2007   Mid RCA - Driver BMS 3.5 mm x 15 mm; 2.0 mm Cutting Balloon PTCA - ostial D1  . CORONARY ANGIOPLASTY WITH STENT PLACEMENT  08/07/11   Abnormal TM Myoview - for exertional throat discomfort: Patent RCA stent, 90% circumflex -- Vision BMS 3.5 mm x 12 mm -->  4.2 mm. Ostial D2 95% -- 2.25 mm Cutting Balloon PTCA  . DOPPLER ECHOCARDIOGRAPHY  May 2009   Normal EF, impaired relaxation, with sclerotic aortic valve. No stenosis.  . INGUINAL HERNIA REPAIR  ~ 1970   left  . KNEE ARTHROSCOPY  02/20/11   left  . LEFT HEART CATHETERIZATION WITH CORONARY ANGIOGRAM N/A 08/07/2011   Procedure: LEFT HEART CATHETERIZATION WITH CORONARY ANGIOGRAM;  Surgeon: Leonie Man, MD;  Location: [facility] CATH LAB;  Service: Cardiovascular;  Laterality: N/A;  . LEFT HEART CATHETERIZATION WITH CORONARY ANGIOGRAM N/A 03/06/2012   Procedure: LEFT HEART CATHETERIZATION WITH CORONARY ANGIOGRAM;  Surgeon: Leonie Man, MD;   Location: [facility] CATH LAB;  Service: Cardiovascular;  Laterality: N/A;  . NM MYOVIEW LTD  Jan. 31, 2014   Low risk, EF 49%, improved from prior. Small apical and apical septal defect, fixed likely artifact no skin or infarction.  Marland Kitchen PARTIAL KNEE ARTHROPLASTY  06/2003   right  . PERCUTANEOUS CORONARY STENT INTERVENTION (PCI-S)  08/07/2011   Procedure: PERCUTANEOUS CORONARY STENT INTERVENTION (PCI-S);  Surgeon: Leonie Man, MD;  Location: [facility] CATH LAB;  Service: Cardiovascular;;  . POLYPECTOMY  05/28/2019   Procedure: POLYPECTOMY;  Surgeon: Rogene Houston, MD;  [facility];  Service: Endoscopy;;  colon  . TONSILLECTOMY     "as a child"  . TOTAL KNEE ARTHROPLASTY  11/2003   right x2 and one on left- Total of 3.  . TOTAL KNEE ARTHROPLASTY Left 09/28/2012   Procedure: TOTAL KNEE ARTHROPLASTY;  Surgeon: Gearlean Alf, MD;  Location: [facility] ORS;  Service: Orthopedics;  Laterality: Left;   Allergies  Allergen Reactions  . Rosuvastatin Calcium Other (See Comments)    Myalgias, tired crestor -aches   No current facility-administered medications on file prior to encounter.   Current Outpatient Medications on File Prior to Encounter  Medication Sig Dispense Refill  . acetaminophen (TYLENOL) 650 MG CR tablet Take 650 mg by mouth every 8 (eight) hours as needed for pain.    Marland Kitchen amLODipine (NORVASC) 10 MG tablet TAKE 1 TABLET(10 MG) BY MOUTH DAILY 90 tablet 1  . Ascorbic Acid (VITAMIN C) 1000 MG tablet Take 1,000 mg by mouth daily.    Marland Kitchen azelastine (ASTELIN) 0.1 % nasal spray Place 2 sprays into both nostrils 2 (two) times daily. 30 mL 5  . budesonide (PULMICORT) 0.5 MG/2ML nebulizer solution FLUSH 2 ML MIXED WITH NASAL SALINE INTO THE NASAL PASSAGES DAILY AS DIRECTED. 180 mL 3  . Cholecalciferol (VITAMIN D) 50 MCG (2000 UT) tablet Take 2,000 Units by mouth daily.    . clobetasol (TEMOVATE) 0.05 % external solution Apply 1 application topically daily as needed (irritation).     . Coenzyme Q10 (CO  Q-10) 100 MG CAPS Take 300 capsules by mouth 3 (three) times daily. (Patient taking differently: Take 100 capsules by mouth daily. ) 90 each 11  . cyclobenzaprine (FLEXERIL) 10 MG tablet Take 1 tablet (10 mg total) by mouth 2 (two) times daily as needed for muscle spasms. 20 tablet 0  . EPINEPHrine 0.3 mg/0.3 mL IJ SOAJ injection Inject 0.3 mg into the muscle as needed for anaphylaxis.     Marland Kitchen EUCRISA 2 % OINT Apply 1 application topically 2 (two) times daily as needed. 60 g 5  . ezetimibe (ZETIA) 10 MG tablet TAKE 1 TABLET(10 MG) BY MOUTH DAILY (Patient taking differently: Take 10 mg by mouth daily. ) 90 tablet 3  . Fluticasone Propionate (XHANCE) 93 MCG/ACT [place] 2  application into the nose 2 (two) times daily. 32 mL 6  . hydrochlorothiazide (HYDRODIURIL) 25 MG tablet Take 1 tablet (25 mg total) by mouth daily. 90 tablet 3  . lisinopril (ZESTRIL) 40 MG tablet Take 0.5 tablets (20 mg total) by mouth daily. 90 tablet 3  . loperamide (IMODIUM) 2 MG capsule Take 1 capsule (2 mg total) by mouth 4 (four) times daily as needed for diarrhea or loose stools. 12 capsule 0  . magnesium gluconate (MAGONATE) 500 MG tablet Take 500 mg by mouth daily.    . metFORMIN (GLUCOPHAGE) 500 MG tablet Take 500 mg by mouth daily with breakfast.     . Multiple Vitamins-Minerals (PRESERVISION AREDS PO) Take 1 capsule by mouth 2 (two) times daily.     Marland Kitchen nystatin-triamcinolone (MYCOLOG II) cream Apply 1 application topically 2 (two) times daily as needed (dry skin).     . predniSONE (DELTASONE) 10 MG tablet Take 1 tablet (10 mg total) by mouth daily as needed (arthritis). 3,3, 2, 1 tabs 30 tablet 0  . simvastatin (ZOCOR) 10 MG tablet TAKE 1 TABLET BY MOUTH DAILY AT 6 PM (Patient taking differently: Take 10 mg by mouth daily at 6 PM. ) 90 tablet 2  . tacrolimus (PROTOPIC) 0.1 % ointment Apply topically 2 (two) times daily. 100 g 0  . tamsulosin (FLOMAX) 0.4 MG CAPS capsule Take 0.4 mg by mouth daily as needed (prostate).       . triazolam (HALCION) 0.125 MG tablet Take 1.5 tablets (0.1875 mg total) by mouth at bedtime. Pt takes 1.5 tabs at bedtime (Patient taking differently: Take 0.25 mg by mouth at bedtime. ) 30 tablet 5   Social History   Socioeconomic History  . Marital status: Married    Spouse name: Not on file  . Number of children: Not on file  . Years of education: Not on file  . Highest education level: Not on file  Occupational History  . 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 Smoking status: Former Smoker    Packs/day: 2.00    Years: 28.00    Pack years: 56.00    Types: Cigarettes    Quit date: 07/29/1980    Years since quitting: 39.3  . Smokeless tobacco: Never Used  Substance and Sexual Activity  . Alcohol use: Yes    Alcohol/week: 2.0 standard drinks    Types: 1 Glasses of wine, 1 Standard drinks or equivalent per week    Comment:  "wine or whiskey" occasionally  . Drug use: No  . Sexual activity: Never  Other Topics Concern  . Not on file  Social History Narrative   He is a married father of 2, grandfather of 32. He is not really getting a lot of exercise now. He previously had been working out on a treadmill at least 2 times a day for 15 minutes at a time, but he   is not able to do that as much now because his knee hurts him too bad. Does not smoke and only occasional alcoholic beverage.   Social Determinants of Health   Financial Resource Strain:   . Difficulty of Paying Living Expenses:   Food Insecurity:   . Worried About Charity fundraiser in the Last Year:   . Arboriculturist in the Last Year:   Transportation Needs:   . Film/video editor (Medical):   Marland Kitchen Lack of Transportation (Non-Medical):   Physical Activity:   .  Days of Exercise per Week:   . Minutes of Exercise per Session:   Stress:   . Feeling of Stress :   Social Connections:   . Frequency of Communication with Friends and Family:   . Frequency of Social  Gatherings with Friends and Family:   . Attends Religious Services:   . Active Member of Clubs or Organizations:   . Attends Archivist Meetings:   Marland Kitchen Marital Status:   Intimate Partner Violence:   . Fear of Current or Ex-Partner:   . Emotionally Abused:   Marland Kitchen Physically Abused:   . Sexually Abused:    Family History  Problem Relation Age of Onset  . Emphysema Father   . Lung cancer Paternal Uncle        x 2 smokers  . Colon cancer Neg Hx     OBJECTIVE:  Vitals:   12/06/19 0941  BP: (!) 144/74  Pulse: 77  Resp: 17  Temp: 98 F (36.7 C)  TempSrc: Oral  SpO2: 90%    General appearance: alert; well-appearing, nontoxic; speaking in full sentences and tolerating own secretions HEENT: NCAT; Ears: EACs clear, TMs pearly gray; Eyes: PERRL.  EOM grossly intact.Nose: nares patent without rhinorrhea, Throat: oropharynx clear, tonsils non erythematous or enlarged, uvula midline  Neck: supple without LAD Lungs: unlabored respirations, symmetrical air entry; cough: absent; no respiratory distress; CTAB Heart: regular rate and rhythm.  Skin: warm and dry Psychological: alert and cooperative; normal mood and affect   ASSESSMENT & PLAN:  1. Exposure to COVID-19 virus     COVID testing ordered.  It will take between 2-5 days for test results.  Someone will contact you regarding abnormal results.    In the meantime:  If you were to develop symptoms: You should remain isolated in your home for 10 days from symptom onset AND greater than 72 hours after symptoms resolution (absence of fever without the use of fever-reducing medication and improvement in respiratory symptoms), whichever is longer If you have had exposure: you should remain in quarantine for 7 days from exposure.  However, if symptoms develop you must self isolate and return for retesting Get plenty of rest and push fluids Follow up with PCP as needed Call or go to the ED if you have any new symptoms such as fever,  cough, shortness of breath, chest tightness, chest pain, turning blue, changes in mental status, etc...   Reviewed expectations re: course of current medical issues. Questions answered. Outlined signs and symptoms indicating need for more acute intervention. Patient verbalized understanding. After Visit Summary given.         Lestine Box, PA-C 12/06/19 1001

## 2019-12-06 NOTE — Telephone Encounter (Signed)
New Message:   Pt says he is very concerned, every time his oxygen level is checked it is low. He wonder if one of the medicine might be causing this?

## 2019-12-06 NOTE — Telephone Encounter (Signed)
Pt states that he usually checks his oxygen at home as well as when he gets check ups at the doctor. He was seen today at another office and the person taking his vitals mentioned his low oxygen saturation. He states it was 89%. When he checks it at home he is at rest and it usually reads between 89-90%. He denies any SOB or discomfort and states he feels fine but he is concerned because this is not the first time that someone has mentioned his oxygen level being too low. I advised him to reach out to his PCP regarding this and in the meantime I would seek Dr. Darcus Pester advisement. Pt agreeable to plan.

## 2019-12-06 NOTE — Discharge Instructions (Signed)

## 2019-12-07 LAB — SARS-COV-2, NAA 2 DAY TAT

## 2019-12-07 LAB — NOVEL CORONAVIRUS, NAA: SARS-CoV-2, NAA: NOT DETECTED

## 2019-12-07 NOTE — Telephone Encounter (Signed)
This probably has not been checked before simply because he has not had symptoms.  My suggestion would be that his PCP is the first place to start to evaluate cause for low oxygen levels. If you were to have symptoms of shortness of breath laying down flat or waking up short of breath or swelling, would probably check an echocardiogram to make sure he is not having heart failure symptoms.  Places to start would be chest x-ray and pulmonary function test. ->  The question will be what his oxygen level does when he walks.  If it goes down below 89, then there may be a suggestion to use home oxygen.  This would again be something that his PCP would handle.  Glenetta Hew, MD

## 2019-12-08 ENCOUNTER — Ambulatory Visit (INDEPENDENT_AMBULATORY_CARE_PROVIDER_SITE_OTHER): Payer: Medicare Other

## 2019-12-08 DIAGNOSIS — J309 Allergic rhinitis, unspecified: Secondary | ICD-10-CM | POA: Diagnosis not present

## 2019-12-08 NOTE — Telephone Encounter (Addendum)
Spoke with patient and reviewed recommendations He did state that he was having almost like reflux burning in center of chest.  Happens at rest, not with exertion He has not taken anything for reflux, occurred after eating onions today  Advised to discuss with PCP and try antiacids

## 2019-12-09 DIAGNOSIS — E1140 Type 2 diabetes mellitus with diabetic neuropathy, unspecified: Secondary | ICD-10-CM | POA: Diagnosis not present

## 2019-12-09 DIAGNOSIS — E1151 Type 2 diabetes mellitus with diabetic peripheral angiopathy without gangrene: Secondary | ICD-10-CM | POA: Diagnosis not present

## 2019-12-15 ENCOUNTER — Ambulatory Visit (INDEPENDENT_AMBULATORY_CARE_PROVIDER_SITE_OTHER): Payer: Medicare Other

## 2019-12-15 DIAGNOSIS — J309 Allergic rhinitis, unspecified: Secondary | ICD-10-CM | POA: Diagnosis not present

## 2019-12-21 DIAGNOSIS — L820 Inflamed seborrheic keratosis: Secondary | ICD-10-CM | POA: Diagnosis not present

## 2019-12-22 ENCOUNTER — Ambulatory Visit (INDEPENDENT_AMBULATORY_CARE_PROVIDER_SITE_OTHER): Payer: Medicare Other

## 2019-12-22 DIAGNOSIS — J309 Allergic rhinitis, unspecified: Secondary | ICD-10-CM | POA: Diagnosis not present

## 2019-12-31 ENCOUNTER — Ambulatory Visit (INDEPENDENT_AMBULATORY_CARE_PROVIDER_SITE_OTHER): Payer: Medicare Other

## 2019-12-31 DIAGNOSIS — J309 Allergic rhinitis, unspecified: Secondary | ICD-10-CM | POA: Diagnosis not present

## 2020-01-04 ENCOUNTER — Other Ambulatory Visit: Payer: Self-pay | Admitting: Cardiology

## 2020-01-04 DIAGNOSIS — H353221 Exudative age-related macular degeneration, left eye, with active choroidal neovascularization: Secondary | ICD-10-CM | POA: Diagnosis not present

## 2020-01-04 DIAGNOSIS — H353213 Exudative age-related macular degeneration, right eye, with inactive scar: Secondary | ICD-10-CM | POA: Diagnosis not present

## 2020-01-05 NOTE — Telephone Encounter (Signed)
Rx request sent to pharmacy.  

## 2020-01-12 ENCOUNTER — Ambulatory Visit (INDEPENDENT_AMBULATORY_CARE_PROVIDER_SITE_OTHER): Payer: Medicare Other

## 2020-01-12 DIAGNOSIS — J309 Allergic rhinitis, unspecified: Secondary | ICD-10-CM

## 2020-01-18 ENCOUNTER — Other Ambulatory Visit: Payer: Self-pay | Admitting: Cardiology

## 2020-01-19 ENCOUNTER — Ambulatory Visit (INDEPENDENT_AMBULATORY_CARE_PROVIDER_SITE_OTHER): Payer: Medicare Other

## 2020-01-19 DIAGNOSIS — J309 Allergic rhinitis, unspecified: Secondary | ICD-10-CM | POA: Diagnosis not present

## 2020-01-20 ENCOUNTER — Telehealth: Payer: Self-pay | Admitting: Cardiology

## 2020-01-20 NOTE — Telephone Encounter (Signed)
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 He would like to know if Dr. Ellyn Hack can just write him a note or if he needs to have the tests done.

## 2020-01-20 NOTE — Telephone Encounter (Signed)
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

## 2020-01-21 NOTE — Telephone Encounter (Signed)
Finally have the Department of Transportation does not know that EKG stress tests do not show LV function.  It has been quite a long time since he has had any evaluation.  I do think is probably not unreasonable to check a Lexiscan Myoview. In the past have written letters, but it has been about 8 years since his last evaluation.   Glenetta Hew, MD

## 2020-01-24 ENCOUNTER — Other Ambulatory Visit: Payer: Self-pay | Admitting: Cardiology

## 2020-01-24 NOTE — Telephone Encounter (Signed)
Spoke to patient , informed  patient of Dr Ellyn Hack  Recommendation.  patient states he spoke to someone at [facility]   they  instructed him to have his last EKG and last office note brought to office . That may take care of the issue.

## 2020-01-26 ENCOUNTER — Ambulatory Visit (INDEPENDENT_AMBULATORY_CARE_PROVIDER_SITE_OTHER): Payer: Medicare Other

## 2020-01-26 DIAGNOSIS — J309 Allergic rhinitis, unspecified: Secondary | ICD-10-CM | POA: Diagnosis not present

## 2020-01-27 ENCOUNTER — Other Ambulatory Visit: Payer: Self-pay

## 2020-01-27 ENCOUNTER — Encounter (HOSPITAL_COMMUNITY)
Admission: RE | Admit: 2020-01-27 | Discharge: 2020-01-27 | Disposition: A | Discharge status: Home / Self Care | Payer: Medicare Other | Source: Ambulatory Visit | Attending: Family Medicine | Admitting: Family Medicine

## 2020-01-27 DIAGNOSIS — Z452 Encounter for adjustment and management of vascular access device: Secondary | ICD-10-CM | POA: Insufficient documentation

## 2020-01-27 MED ORDER — HEPARIN SOD (PORK) LOCK FLUSH 100 UNIT/ML IV SOLN
500.0000 [IU] | Freq: Once | INTRAVENOUS | Status: AC
  Administered 2020-01-27: 500 [IU] via INTRAVENOUS

## 2020-02-02 ENCOUNTER — Ambulatory Visit: Payer: Self-pay

## 2020-02-02 DIAGNOSIS — J309 Allergic rhinitis, unspecified: Secondary | ICD-10-CM

## 2020-02-03 ENCOUNTER — Other Ambulatory Visit: Payer: Self-pay | Admitting: Cardiology

## 2020-02-04 DIAGNOSIS — H353230 Exudative age-related macular degeneration, bilateral, stage unspecified: Secondary | ICD-10-CM | POA: Diagnosis not present

## 2020-02-04 NOTE — Telephone Encounter (Signed)
Patient came by office states he has not received information.  copy of  Last EKG, and progress note given to patient.  RN asked patient if he wore the monitor that is mention in the last office note   patient states he did receive monitor in the mail - but did wear ti , he is not sure where it is , he will need to ask his wife when she returns.  Rn informed him to wear it and then return to be process.  patient verbalized understanding

## 2020-02-07 ENCOUNTER — Telehealth: Payer: Self-pay | Admitting: Cardiology

## 2020-02-07 DIAGNOSIS — I10 Essential (primary) hypertension: Secondary | ICD-10-CM | POA: Diagnosis not present

## 2020-02-07 DIAGNOSIS — E119 Type 2 diabetes mellitus without complications: Secondary | ICD-10-CM | POA: Diagnosis not present

## 2020-02-07 DIAGNOSIS — E6609 Other obesity due to excess calories: Secondary | ICD-10-CM | POA: Diagnosis not present

## 2020-02-07 DIAGNOSIS — L4050 Arthropathic psoriasis, unspecified: Secondary | ICD-10-CM | POA: Diagnosis not present

## 2020-02-07 DIAGNOSIS — Z6835 Body mass index (BMI) 35.0-35.9, adult: Secondary | ICD-10-CM | POA: Diagnosis not present

## 2020-02-07 NOTE — Telephone Encounter (Signed)
Pt was taking Imdur 60mg  QD per Dr. Allison Quarry note in Sept 2020.  Went into hospital in Oct 2020 and it was not on discharge medication list.  Seen Dr. Ellyn Hack in March 2021 and did not report taking Imdur.  Spoke with pt and he states he has been taking the Imdur all this time. Advised I will send message to Dr. Ellyn Hack to get the ok to prescribe this medication again.

## 2020-02-07 NOTE — Telephone Encounter (Signed)
Pt stated that he needed isosorbide authorized by Dr. Ellyn Hack to receive medication. I do not see medication on medications list. Please call pt to confirm or authorize medication

## 2020-02-09 ENCOUNTER — Ambulatory Visit (INDEPENDENT_AMBULATORY_CARE_PROVIDER_SITE_OTHER): Payer: Medicare Other

## 2020-02-09 DIAGNOSIS — J309 Allergic rhinitis, unspecified: Secondary | ICD-10-CM | POA: Diagnosis not present

## 2020-02-11 NOTE — Telephone Encounter (Signed)
Yes please update his medication list appropriately.  Glenetta Hew, MD

## 2020-02-12 DIAGNOSIS — H5203 Hypermetropia, bilateral: Secondary | ICD-10-CM | POA: Diagnosis not present

## 2020-02-14 DIAGNOSIS — J3089 Other allergic rhinitis: Secondary | ICD-10-CM | POA: Diagnosis not present

## 2020-02-14 MED ORDER — ISOSORBIDE MONONITRATE ER 30 MG PO TB24: 30.0000 mg | ORAL_TABLET | Freq: Every day | ORAL | 3 refills | Status: DC

## 2020-02-14 NOTE — Telephone Encounter (Signed)
Left message for pt to call to confirm isosorbide dosage and pharmacy.

## 2020-02-14 NOTE — Telephone Encounter (Signed)
Patient is returning call.  °

## 2020-02-14 NOTE — Progress Notes (Signed)
Vials exp 02-13-21 

## 2020-02-14 NOTE — Telephone Encounter (Signed)
Spoke to patient, he states he has been taking 1/2 tablet (60 mg tablet) daily.   Sent in isosorbide (Imdur) 30 mg tablet once daily to preferred pharmacy.   Patient aware and verbalized understanding.  He is aware he will no longer have to cut the tablet as this is a 30 mg tablet.

## 2020-02-15 DIAGNOSIS — H31091 Other chorioretinal scars, right eye: Secondary | ICD-10-CM | POA: Diagnosis not present

## 2020-02-15 DIAGNOSIS — H35422 Microcystoid degeneration of retina, left eye: Secondary | ICD-10-CM | POA: Diagnosis not present

## 2020-02-15 DIAGNOSIS — H353221 Exudative age-related macular degeneration, left eye, with active choroidal neovascularization: Secondary | ICD-10-CM | POA: Diagnosis not present

## 2020-02-15 DIAGNOSIS — H353213 Exudative age-related macular degeneration, right eye, with inactive scar: Secondary | ICD-10-CM | POA: Diagnosis not present

## 2020-02-18 ENCOUNTER — Ambulatory Visit (INDEPENDENT_AMBULATORY_CARE_PROVIDER_SITE_OTHER): Payer: Medicare Other

## 2020-02-18 DIAGNOSIS — J309 Allergic rhinitis, unspecified: Secondary | ICD-10-CM

## 2020-02-24 DIAGNOSIS — E1151 Type 2 diabetes mellitus with diabetic peripheral angiopathy without gangrene: Secondary | ICD-10-CM | POA: Diagnosis not present

## 2020-02-24 DIAGNOSIS — E1140 Type 2 diabetes mellitus with diabetic neuropathy, unspecified: Secondary | ICD-10-CM | POA: Diagnosis not present

## 2020-02-25 ENCOUNTER — Ambulatory Visit (INDEPENDENT_AMBULATORY_CARE_PROVIDER_SITE_OTHER): Payer: Medicare Other

## 2020-02-25 DIAGNOSIS — J309 Allergic rhinitis, unspecified: Secondary | ICD-10-CM

## 2020-03-01 ENCOUNTER — Ambulatory Visit (INDEPENDENT_AMBULATORY_CARE_PROVIDER_SITE_OTHER): Payer: Medicare Other

## 2020-03-01 DIAGNOSIS — J309 Allergic rhinitis, unspecified: Secondary | ICD-10-CM

## 2020-03-08 ENCOUNTER — Other Ambulatory Visit: Payer: Self-pay

## 2020-03-08 ENCOUNTER — Ambulatory Visit (INDEPENDENT_AMBULATORY_CARE_PROVIDER_SITE_OTHER): Payer: Medicare Other | Admitting: Family

## 2020-03-08 ENCOUNTER — Ambulatory Visit: Payer: Self-pay

## 2020-03-08 ENCOUNTER — Ambulatory Visit
Admission: EM | Admit: 2020-03-08 | Discharge: 2020-03-08 | Disposition: A | Discharge status: Home / Self Care | Payer: Medicare Other | Attending: Emergency Medicine | Admitting: Emergency Medicine

## 2020-03-08 ENCOUNTER — Encounter: Payer: Self-pay | Admitting: Family

## 2020-03-08 VITALS — BP 140/76 | HR 63 | Resp 17

## 2020-03-08 DIAGNOSIS — J3089 Other allergic rhinitis: Secondary | ICD-10-CM

## 2020-03-08 DIAGNOSIS — Z20822 Contact with and (suspected) exposure to covid-19: Secondary | ICD-10-CM

## 2020-03-08 DIAGNOSIS — J309 Allergic rhinitis, unspecified: Secondary | ICD-10-CM

## 2020-03-08 DIAGNOSIS — J069 Acute upper respiratory infection, unspecified: Secondary | ICD-10-CM

## 2020-03-08 MED ORDER — AZELASTINE HCL 0.1 % NA SOLN: 2.0000 | Freq: Two times a day (BID) | NASAL | 5 refills | Status: DC

## 2020-03-08 NOTE — Discharge Instructions (Signed)
COVID testing ordered.  It will take between 2-5 days for test results.  Someone will contact you regarding abnormal results.    In the meantime: You should remain isolated in your home for 10 days from symptom onset AND greater than 72 hours after symptoms resolution (absence of fever without the use of fever-reducing medication and improvement in respiratory symptoms), whichever is longer Get plenty of rest and push fluids Use OTC zyrtec for nasal congestion, runny nose, and/or sore throat Use OTC flonase for nasal congestion and runny nose Use medications daily for symptom relief Use OTC medications like ibuprofen or tylenol as needed fever or pain Call or go to the ED if you have any new or worsening symptoms such as fever, cough, shortness of breath, chest tightness, chest pain, turning blue, changes in mental status, etc...  

## 2020-03-08 NOTE — Progress Notes (Signed)
RE: Arthur Lutz MRN: [id number] DOB: 11-Dec-1940 Date of Telemedicine Visit: 03/08/2020  Referring provider: Redmond School, MD Primary care provider: Redmond School, MD  Chief Complaint: Medication Management and Immunotherapy   Telemedicine Follow Up Visit via Telephone: I connected with Keturah Barre for a follow up on 03/08/20 by telephone and verified that I am speaking with the correct person using two identifiers.   I discussed the limitations, risks, security and privacy concerns of performing an evaluation and management service by telephone and the availability of in person appointments. I also discussed with the patient that there may be a patient responsible charge related to this service. The patient expressed understanding and agreed to proceed.  Patient is in the car. Provider is at the office.  Visit start time: 3:20 pm Visit end time: 3:59 pm Insurance consent/check in by: [place] consent and medical assistant/nurse: Aileen Pilot.  History of Present Illness: He is a 79 y.o. male, who is being followed for perennial allergic rhinitis and itching. His previous allergy office visit was on 10/27/2019 with Dr. Ernst Bowler.   Perennial allergic rhinitis is reported as not well controlled with the use of budesonide 0.5 mg once a day as needed with sinus rinse, azelastine 2 sprays each nostril twice a day as needed, Coricidin as needed, and allergy injections once a week.  He reports that the budesonide 0.5 mg, azelastine nasal spray, and Coricidin help with his allergy symptoms, but it is only short-term and he does not like to take medications every day.  He reports that he has been taking allergy injections for approximately 9 months and cannot tell that they are helping any.  He reports nasal congestion, sore throat from sinus drainage, and headache.  He denies any large local reactions with his allergy injections.  Assessment and Plan: Callum is a 79 y.o. male  with: Patient Instructions  Allergic rhinitis Continue allergy injections once a week Continue Coricidin as needed Continue azelastine 2 sprays each nostril twice a day as needed for runny nose and post nasal drip Continue budesonide 0.5 mg once a day with sinus rinse as needed  Schedule appointment for skin testing to environmental inhalents. Stay off all antihistames 3 days prior to this appointment Do not use Xhance on the same day that you use budesonide 0.5 mg once a day with sinus rinse.  Please let us know if this treatment plan is not working well for you. Schedule follow up appointment in 2 weeks for skin testing to environmental inhalents.   No follow-ups on file.  Meds ordered this encounter  Medications   azelastine (ASTELIN) 0.1 % nasal spray    Sig: Place 2 sprays into both nostrils 2 (two) times daily.    Dispense:  30 mL    Refill:  5   Lab Orders  No laboratory test(s) ordered today    Diagnostics: None.  Medication List:  Current Outpatient Medications  Medication Sig Dispense Refill   acetaminophen (TYLENOL) 650 MG CR tablet Take 650 mg by mouth every 8 (eight) hours as needed for pain.     amLODipine (NORVASC) 10 MG tablet TAKE 1 TABLET(10 MG) BY MOUTH DAILY 90 tablet 1   Ascorbic Acid (VITAMIN C) 1000 MG tablet Take 1,000 mg by mouth daily.     azelastine (ASTELIN) 0.1 % nasal spray Place 2 sprays into both nostrils 2 (two) times daily. 30 mL 5   budesonide (PULMICORT) 0.5 MG/2ML nebulizer solution FLUSH 2 ML MIXED WITH  NASAL SALINE INTO THE NASAL PASSAGES DAILY AS DIRECTED. 180 mL 3   Cholecalciferol (VITAMIN D) 50 MCG (2000 UT) tablet Take 2,000 Units by mouth daily.     clobetasol (TEMOVATE) 0.05 % external solution Apply 1 application topically daily as needed (irritation).      Coenzyme Q10 (CO Q-10) 100 MG CAPS Take 300 capsules by mouth 3 (three) times daily. (Patient taking differently: Take 100 capsules by mouth daily. ) 90 each 11    cyclobenzaprine (FLEXERIL) 10 MG tablet Take 1 tablet (10 mg total) by mouth 2 (two) times daily as needed for muscle spasms. 20 tablet 0   EPINEPHrine 0.3 mg/0.3 mL IJ SOAJ injection Inject 0.3 mg into the muscle as needed for anaphylaxis.      ezetimibe (ZETIA) 10 MG tablet TAKE 1 TABLET(10 MG) BY MOUTH DAILY (Patient taking differently: Take 10 mg by mouth daily. ) 90 tablet 3   Fluticasone Propionate (XHANCE) 93 MCG/ACT EXHU Place 2 application into the nose 2 (two) times daily. 32 mL 6   isosorbide mononitrate (IMDUR) 30 MG 24 hr tablet Take 1 tablet (30 mg total) by mouth daily. 90 tablet 3   lisinopril (ZESTRIL) 40 MG tablet TAKE 1/2 TABLET BY MOUTH DAILY 90 tablet 3   loperamide (IMODIUM) 2 MG capsule Take 1 capsule (2 mg total) by mouth 4 (four) times daily as needed for diarrhea or loose stools. 12 capsule 0   magnesium gluconate (MAGONATE) 500 MG tablet Take 500 mg by mouth daily.     metFORMIN (GLUCOPHAGE) 500 MG tablet Take 500 mg by mouth daily with breakfast.      Multiple Vitamins-Minerals (PRESERVISION AREDS PO) Take 1 capsule by mouth 2 (two) times daily.      nystatin-triamcinolone (MYCOLOG II) cream Apply 1 application topically 2 (two) times daily as needed (dry skin).      predniSONE (DELTASONE) 10 MG tablet Take 1 tablet (10 mg total) by mouth daily as needed (arthritis). 3,3, 2, 1 tabs 30 tablet 0   simvastatin (ZOCOR) 10 MG tablet Take 1 tablet (10 mg total) by mouth daily at 6 PM. 90 tablet 1   tacrolimus (PROTOPIC) 0.1 % ointment Apply topically 2 (two) times daily. 100 g 0   tamsulosin (FLOMAX) 0.4 MG CAPS capsule Take 0.4 mg by mouth daily as needed (prostate).      triazolam (HALCION) 0.125 MG tablet Take 1.5 tablets (0.1875 mg total) by mouth at bedtime. Pt takes 1.5 tabs at bedtime (Patient taking differently: Take 0.25 mg by mouth at bedtime. ) 30 tablet 5   EUCRISA 2 % OINT Apply 1 application topically 2 (two) times daily as needed. (Patient not  taking: Reported on 03/08/2020) 60 g 5   hydrochlorothiazide (HYDRODIURIL) 25 MG tablet Take 1 tablet (25 mg total) by mouth daily. 90 tablet 3   No current facility-administered medications for this visit.   Allergies: Allergies  Allergen Reactions   Rosuvastatin Calcium Other (See Comments)    Myalgias, tired crestor -aches   I reviewed his past medical history, social history, family history, and environmental history and no significant changes have been reported from previous visit on 10/27/2019.  Review of Systems  Constitutional: Negative for chills and fever.  HENT: Positive for congestion, postnasal drip and rhinorrhea.   Eyes:       Reports occasional itchy watery eyes  Respiratory: Negative for cough, shortness of breath and wheezing.   Cardiovascular: Negative for chest pain and palpitations.  Gastrointestinal: Negative for abdominal  pain.  Genitourinary: Negative for dysuria.  Skin: Negative for rash.  Allergic/Immunologic: Positive for environmental allergies.  Neurological: Positive for headaches.   Objective: Physical Exam Not obtained as encounter was done via telephone.   Previous notes and tests were reviewed.  I discussed the assessment and treatment plan with the patient. The patient was provided an opportunity to ask questions and all were answered. The patient agreed with the plan and demonstrated an understanding of the instructions.   The patient was advised to call back or seek an in-person evaluation if the symptoms worsen or if the condition fails to improve as anticipated.  I provided 39 minutes of non-face-to-face time during this encounter.  It was my pleasure to participate in [facility] Cubit care today. Please feel free to contact me with any questions or concerns.   Sincerely,  [name], FNP

## 2020-03-08 NOTE — ED Provider Notes (Signed)
[place]   [id number] 03/08/20 Arrival Time: 1430   CC: COVID test  SUBJECTIVE: History from: patient.  Arthur Lutz is a 79 y.o. male who presents with sneezing and scratchy throat x 1-2 weeks.  Denies sick exposure to COVID, flu or strep.  Requests COVID test.  Receives allergy shots.  Denies aggravating factors.  Reports previous symptoms in the past with chronic allergies.   Denies fever, chills, fatigue, cough, sore throat, SOB, wheezing, chest pain, nausea, changes in bowel or bladder habits.    ROS: As per HPI.  All other pertinent ROS negative.     Past Medical History:  Diagnosis Date  . Allergic rhinitis   . Aortic valve sclerosis    Cause of murmur  . Arthritis of knee, left, needs total Knee in future with Dr. Wynelle Link  08/07/2011  . Bladder cancer ([place]) 11/2005  . CAD S/P percutaneous coronary angioplasty 08/07/2011   NEW 08/07/11 cutting balloon PTCA to 95% ostial diag. and 90%  proximal LCX.  prior cutting balloon and to RCA  & ostium of ostial diag; Last Cath 02/2012 - Patent Diag PTCA, RCA & Circ BMS stents. Normal EF & EDP; Myoview 2/14 no evidence of ischemia or infarction  . Diabetes mellitus type 2, controlled, with complications ([place])   . Diabetes mellitus without complication ([place])   . Dyslipidemia   . History of heart attack 11/2007   "mild; did not affect the heart muscle"; PCI to RCA, Cutting PTCA Diag ostium  . Hypertension   . Macular degeneration   . Macular degeneration of right eye    Now getting shots.   . Melanoma of skin ([place]) ~ 2005   "level 2; on my back"  . Myocardial infarction ([place]) 2012  . Osteoarthritis    Status post right knee arthroplasty, positive for staph infection. Pending right arthroplasty  . Portacath in place 08/11/2012  . Statin intolerance 08/07/2011  . Stroke [facility]) 1980's   "had facial strokes; 2; about a year apart; never did find what caused them"   Past Surgical History:  Procedure Laterality Date  . BIOPSY   05/28/2019   Procedure: BIOPSY;  Surgeon: Rogene Houston, MD;  Location: [facility] ENDO SU[facility];  Service: Endoscopy;;  cecum  . CARDIAC CATHETERIZATION  August '13   Widely patent RCA and LCx stents. Also patent PTCA site to D1, ~40-50% D2. Normal EF, normal EDP  . CATARACT EXTRACTION W/ INTRAOCULAR LENS IMPLANT  ~ 2010   right  . CATARACT EXTRACTION W/PHACO Left 11/21/2016   Procedure: CATARACT EXTRACTION PHACO AND INTRAOCULAR LENS PLACEMENT (IOC) CDE - 11.70 ;  Surgeon: Tonny Branch, MD;  Location: [facility] ORS;  Service: Ophthalmology;  Laterality: Left;  left  . cold cup removal bladder lesion  11/2005   "malignant"  . COLONOSCOPY N/A 12/09/2014   Procedure: COLONOSCOPY;  Surgeon: Rogene Houston, MD;  Location: [facility];  Service: Endoscopy;  Laterality: N/A;  955 - moved to 8:30 - Ann to notify pt  . COLONOSCOPY WITH PROPOFOL N/A 05/28/2019   Procedure: COLONOSCOPY WITH PROPOFOL;  Surgeon: Rogene Houston, MD;  [facility];  Service: Endoscopy;  Laterality: N/A;  . CORONARY ANGIOPLASTY WITH STENT PLACEMENT  11/2007   Mid RCA - Driver BMS 3.5 mm x 15 mm; 2.0 mm Cutting Balloon PTCA - ostial D1  . CORONARY ANGIOPLASTY WITH STENT PLACEMENT  08/07/11   Abnormal TM Myoview - for exertional throat discomfort: Patent RCA stent, 90% circumflex --  Vision BMS 3.5 mm x 12 mm --> 4.2 mm. Ostial D2 95% -- 2.25 mm Cutting Balloon PTCA  . DOPPLER ECHOCARDIOGRAPHY  May 2009   Normal EF, impaired relaxation, with sclerotic aortic valve. No stenosis.  . INGUINAL HERNIA REPAIR  ~ 1970   left  . KNEE ARTHROSCOPY  02/20/11   left  . LEFT HEART CATHETERIZATION WITH CORONARY ANGIOGRAM N/A 08/07/2011   Procedure: LEFT HEART CATHETERIZATION WITH CORONARY ANGIOGRAM;  Surgeon: Leonie Man, MD;  Location: [facility] CATH LAB;  Service: Cardiovascular;  Laterality: N/A;  . LEFT HEART CATHETERIZATION WITH CORONARY ANGIOGRAM N/A 03/06/2012   Procedure: LEFT HEART CATHETERIZATION WITH CORONARY ANGIOGRAM;  Surgeon: Leonie Man, MD;  Location: [facility] CATH LAB;  Service: Cardiovascular;  Laterality: N/A;  . NM MYOVIEW LTD  Jan. 31, 2014   Low risk, EF 49%, improved from prior. Small apical and apical septal defect, fixed likely artifact no skin or infarction.  Marland Kitchen PARTIAL KNEE ARTHROPLASTY  06/2003   right  . PERCUTANEOUS CORONARY STENT INTERVENTION (PCI-S)  08/07/2011   Procedure: PERCUTANEOUS CORONARY STENT INTERVENTION (PCI-S);  Surgeon: Leonie Man, MD;  Location: [facility] CATH LAB;  Service: Cardiovascular;;  . POLYPECTOMY  05/28/2019   Procedure: POLYPECTOMY;  Surgeon: Rogene Houston, MD;  Location[facility];  Service: Endoscopy;;  colon  . TONSILLECTOMY     "as a child"  . TOTAL KNEE ARTHROPLASTY  11/2003   right x2 and one on left- Total of 3.  . TOTAL KNEE ARTHROPLASTY Left 09/28/2012   Procedure: TOTAL KNEE ARTHROPLASTY;  Surgeon: Gearlean Alf, MD;  Location: [facility] ORS;  Service: Orthopedics;  Laterality: Left;   Allergies  Allergen Reactions  . Rosuvastatin Calcium Other (See Comments)    Myalgias, tired crestor -aches   No current facility-administered medications on file prior to encounter.   Current Outpatient Medications on File Prior to Encounter  Medication Sig Dispense Refill  . acetaminophen (TYLENOL) 650 MG CR tablet Take 650 mg by mouth every 8 (eight) hours as needed for pain.    Marland Kitchen amLODipine (NORVASC) 10 MG tablet TAKE 1 TABLET(10 MG) BY MOUTH DAILY 90 tablet 1  . Ascorbic Acid (VITAMIN C) 1000 MG tablet Take 1,000 mg by mouth daily.    Marland Kitchen azelastine (ASTELIN) 0.1 % nasal spray Place 2 sprays into both nostrils 2 (two) times daily. 30 mL 5  . budesonide (PULMICORT) 0.5 MG/2ML nebulizer solution FLUSH 2 ML MIXED WITH NASAL SALINE INTO THE NASAL PASSAGES DAILY AS DIRECTED. 180 mL 3  . Cholecalciferol (VITAMIN D) 50 MCG (2000 UT) tablet Take 2,000 Units by mouth daily.    . clobetasol (TEMOVATE) 0.05 % external solution Apply 1 application topically daily as needed (irritation).     .  Coenzyme Q10 (CO Q-10) 100 MG CAPS Take 300 capsules by mouth 3 (three) times daily. (Patient taking differently: Take 100 capsules by mouth daily. ) 90 each 11  . cyclobenzaprine (FLEXERIL) 10 MG tablet Take 1 tablet (10 mg total) by mouth 2 (two) times daily as needed for muscle spasms. 20 tablet 0  . EPINEPHrine 0.3 mg/0.3 mL IJ SOAJ injection Inject 0.3 mg into the muscle as needed for anaphylaxis.     Marland Kitchen EUCRISA 2 % OINT Apply 1 application topically 2 (two) times daily as needed. 60 g 5  . ezetimibe (ZETIA) 10 MG tablet TAKE 1 TABLET(10 MG) BY MOUTH DAILY (Patient taking differently: Take 10 mg by mouth daily. ) 90 tablet 3  .  Fluticasone Propionate (XHANCE) 93 MCG/ACT EXHU Place 2 application into the nose 2 (two) times daily. 32 mL 6  . hydrochlorothiazide (HYDRODIURIL) 25 MG tablet Take 1 tablet (25 mg total) by mouth daily. 90 tablet 3  . isosorbide mononitrate (IMDUR) 30 MG 24 hr tablet Take 1 tablet (30 mg total) by mouth daily. 90 tablet 3  . lisinopril (ZESTRIL) 40 MG tablet TAKE 1/2 TABLET BY MOUTH DAILY 90 tablet 3  . loperamide (IMODIUM) 2 MG capsule Take 1 capsule (2 mg total) by mouth 4 (four) times daily as needed for diarrhea or loose stools. 12 capsule 0  . magnesium gluconate (MAGONATE) 500 MG tablet Take 500 mg by mouth daily.    . metFORMIN (GLUCOPHAGE) 500 MG tablet Take 500 mg by mouth daily with breakfast.     . Multiple Vitamins-Minerals (PRESERVISION AREDS PO) Take 1 capsule by mouth 2 (two) times daily.     Marland Kitchen nystatin-triamcinolone (MYCOLOG II) cream Apply 1 application topically 2 (two) times daily as needed (dry skin).     . predniSONE (DELTASONE) 10 MG tablet Take 1 tablet (10 mg total) by mouth daily as needed (arthritis). 3,3, 2, 1 tabs 30 tablet 0  . simvastatin (ZOCOR) 10 MG tablet Take 1 tablet (10 mg total) by mouth daily at 6 PM. 90 tablet 1  . tacrolimus (PROTOPIC) 0.1 % ointment Apply topically 2 (two) times daily. 100 g 0  . tamsulosin (FLOMAX) 0.4 MG CAPS  capsule Take 0.4 mg by mouth daily as needed (prostate).     . triazolam (HALCION) 0.125 MG tablet Take 1.5 tablets (0.1875 mg total) by mouth at bedtime. Pt takes 1.5 tabs at bedtime (Patient taking differently: Take 0.25 mg by mouth at bedtime. ) 30 tablet 5   Social History   Socioeconomic History  . Marital status: Married    Spouse name: Not on file  . Number of children: Not on file  . Years of education: Not on file  . Highest education level: Not on file  Occupational History  . 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 Smoking status: Former Smoker    Packs/day: 2.00    Years: 28.00    Pack years: 56.00    Types: Cigarettes    Quit date: 07/29/1980    Years since quitting: 39.6  . Smokeless tobacco: Never Used  Vaping Use  . Vaping Use: Never used  Substance and Sexual Activity  . Alcohol use: Yes    Alcohol/week: 2.0 standard drinks    Types: 1 Glasses of wine, 1 Standard drinks or equivalent per week    Comment:  "wine or whiskey" occasionally  . Drug use: No  . Sexual activity: Never  Other Topics Concern  . Not on file  Social History Narrative   He is a married father of 2, grandfather of 44. He is not really getting a lot of exercise now. He previously had been working out on a treadmill at least 2 times a day for 15 minutes at a time, but he   is not able to do that as much now because his knee hurts him too bad. Does not smoke and only occasional alcoholic beverage.   Social Determinants of Health   Financial Resource Strain:   . Difficulty of Paying Living Expenses:   Food Insecurity:   . Worried About Charity fundraiser in the Last Year:   . Arboriculturist in  the Last Year:   Transportation Needs:   . Film/video editor (Medical):   Marland Kitchen Lack of Transportation (Non-Medical):   Physical Activity:   . Days of Exercise per Week:   . Minutes of Exercise per Session:   Stress:   . Feeling of Stress :    Social Connections:   . Frequency of Communication with Friends and Family:   . Frequency of Social Gatherings with Friends and Family:   . Attends Religious Services:   . Active Member of Clubs or Organizations:   . Attends Archivist Meetings:   Marland Kitchen Marital Status:   Intimate Partner Violence:   . Fear of Current or Ex-Partner:   . Emotionally Abused:   Marland Kitchen Physically Abused:   . Sexually Abused:    Family History  Problem Relation Age of Onset  . Emphysema Father   . Lung cancer Paternal Uncle        x 2 smokers  . Colon cancer Neg Hx     OBJECTIVE:  Vitals:   03/08/20 1447  BP: (!) 149/72  Pulse: 65  Resp: 17  Temp: 98.2 F (36.8 C)  TempSrc: Oral  SpO2: 92%     General appearance: alert; well-appearing, nontoxic; speaking in full sentences and tolerating own secretions HEENT: NCAT; Ears: EACs clear, TMs pearly gray; Eyes: PERRL.  EOM grossly intact.Nose: nares patent without rhinorrhea, Throat: oropharynx clear, tonsils non erythematous or enlarged, uvula midline  Neck: supple without LAD Lungs: unlabored respirations, symmetrical air entry; cough: absent; no respiratory distress; CTAB Heart: regular rate and rhythm.  Skin: warm and dry Psychological: alert and cooperative; normal mood and affect   ASSESSMENT & PLAN:  1. Viral URI   2. Encounter for laboratory testing for COVID-19 virus     No orders of the defined types were placed in this encounter.  COVID testing ordered.  It will take between 2-5 days for test results.  Someone will contact you regarding abnormal results.    In the meantime: You should remain isolated in your home for 10 days from symptom onset AND greater than 72 hours after symptoms resolution (absence of fever without the use of fever-reducing medication and improvement in respiratory symptoms), whichever is longer Get plenty of rest and push fluids Use OTC zyrtec for nasal congestion, runny nose, and/or sore throat Use OTC  flonase for nasal congestion and runny nose Use medications daily for symptom relief Use OTC medications like ibuprofen or tylenol as needed fever or pain Call or go to the ED if you have any new or worsening symptoms such as fever, cough, shortness of breath, chest tightness, chest pain, turning blue, changes in mental status, etc...   Reviewed expectations re: course of current medical issues. Questions answered. Outlined signs and symptoms indicating need for more acute intervention. Patient verbalized understanding. After Visit Summary given.         Lestine Box, PA-C 03/08/20 1459

## 2020-03-08 NOTE — ED Triage Notes (Signed)
Triaged by provider  

## 2020-03-08 NOTE — Patient Instructions (Signed)
Allergic rhinitis Continue allergy injections once a week Continue Coricidin as needed Continue azelastine 2 sprays each nostril twice a day as needed for runny nose and post nasal drip Continue budesonide 0.5 mg once a day with sinus rinse as needed  Schedule appointment for skin testing to environmental inhalents. Stay off all antihistames 3 days prior to this appointment Do not use Xhance on the same day that you use budesonide 0.5 mg once a day with sinus rinse.  Please let us know if this treatment plan is not working well for you. Schedule follow up appointment in 2 weeks for skin testing to environmental inhalents.

## 2020-03-09 LAB — SARS-COV-2, NAA 2 DAY TAT

## 2020-03-09 LAB — NOVEL CORONAVIRUS, NAA: SARS-CoV-2, NAA: NOT DETECTED

## 2020-03-17 ENCOUNTER — Ambulatory Visit (INDEPENDENT_AMBULATORY_CARE_PROVIDER_SITE_OTHER): Payer: Medicare Other

## 2020-03-17 DIAGNOSIS — J309 Allergic rhinitis, unspecified: Secondary | ICD-10-CM | POA: Diagnosis not present

## 2020-03-22 ENCOUNTER — Ambulatory Visit: Payer: Medicare Other | Admitting: Family

## 2020-03-22 ENCOUNTER — Ambulatory Visit (INDEPENDENT_AMBULATORY_CARE_PROVIDER_SITE_OTHER): Payer: Medicare Other

## 2020-03-22 DIAGNOSIS — J309 Allergic rhinitis, unspecified: Secondary | ICD-10-CM

## 2020-03-23 ENCOUNTER — Encounter (HOSPITAL_COMMUNITY): Payer: Medicare Other

## 2020-03-24 DIAGNOSIS — H353213 Exudative age-related macular degeneration, right eye, with inactive scar: Secondary | ICD-10-CM | POA: Diagnosis not present

## 2020-03-24 DIAGNOSIS — H353221 Exudative age-related macular degeneration, left eye, with active choroidal neovascularization: Secondary | ICD-10-CM | POA: Diagnosis not present

## 2020-03-29 ENCOUNTER — Ambulatory Visit (INDEPENDENT_AMBULATORY_CARE_PROVIDER_SITE_OTHER): Payer: Medicare Other

## 2020-03-29 DIAGNOSIS — J309 Allergic rhinitis, unspecified: Secondary | ICD-10-CM

## 2020-03-30 DIAGNOSIS — L4053 Psoriatic spondylitis: Secondary | ICD-10-CM | POA: Diagnosis not present

## 2020-03-30 DIAGNOSIS — E6609 Other obesity due to excess calories: Secondary | ICD-10-CM | POA: Diagnosis not present

## 2020-03-30 DIAGNOSIS — Z6836 Body mass index (BMI) 36.0-36.9, adult: Secondary | ICD-10-CM | POA: Diagnosis not present

## 2020-03-30 DIAGNOSIS — M150 Primary generalized (osteo)arthritis: Secondary | ICD-10-CM | POA: Diagnosis not present

## 2020-04-19 ENCOUNTER — Ambulatory Visit (INDEPENDENT_AMBULATORY_CARE_PROVIDER_SITE_OTHER): Payer: Medicare Other

## 2020-04-19 DIAGNOSIS — J309 Allergic rhinitis, unspecified: Secondary | ICD-10-CM

## 2020-04-28 ENCOUNTER — Ambulatory Visit: Payer: Self-pay

## 2020-04-28 DIAGNOSIS — J309 Allergic rhinitis, unspecified: Secondary | ICD-10-CM

## 2020-05-03 ENCOUNTER — Ambulatory Visit (INDEPENDENT_AMBULATORY_CARE_PROVIDER_SITE_OTHER): Payer: Medicare Other

## 2020-05-03 DIAGNOSIS — J309 Allergic rhinitis, unspecified: Secondary | ICD-10-CM | POA: Diagnosis not present

## 2020-05-04 ENCOUNTER — Encounter (HOSPITAL_COMMUNITY)
Admission: RE | Admit: 2020-05-04 | Discharge: 2020-05-04 | Disposition: A | Discharge status: Home / Self Care | Payer: Medicare Other | Source: Ambulatory Visit | Attending: Family Medicine | Admitting: Family Medicine

## 2020-05-04 ENCOUNTER — Other Ambulatory Visit: Payer: Self-pay

## 2020-05-04 DIAGNOSIS — Z452 Encounter for adjustment and management of vascular access device: Secondary | ICD-10-CM | POA: Diagnosis not present

## 2020-05-04 MED ORDER — HEPARIN SOD (PORK) LOCK FLUSH 100 UNIT/ML IV SOLN
500.0000 [IU] | Freq: Once | INTRAVENOUS | Status: AC
  Administered 2020-05-04: 500 [IU] via INTRAVENOUS

## 2020-05-10 ENCOUNTER — Ambulatory Visit: Payer: Medicare Other | Admitting: Cardiology

## 2020-05-11 DIAGNOSIS — L401 Generalized pustular psoriasis: Secondary | ICD-10-CM | POA: Diagnosis not present

## 2020-05-11 DIAGNOSIS — M542 Cervicalgia: Secondary | ICD-10-CM | POA: Diagnosis not present

## 2020-05-11 DIAGNOSIS — R5382 Chronic fatigue, unspecified: Secondary | ICD-10-CM | POA: Diagnosis not present

## 2020-05-11 DIAGNOSIS — M2550 Pain in unspecified joint: Secondary | ICD-10-CM | POA: Diagnosis not present

## 2020-05-12 DIAGNOSIS — H353221 Exudative age-related macular degeneration, left eye, with active choroidal neovascularization: Secondary | ICD-10-CM | POA: Diagnosis not present

## 2020-05-16 DIAGNOSIS — M1991 Primary osteoarthritis, unspecified site: Secondary | ICD-10-CM | POA: Diagnosis not present

## 2020-05-16 DIAGNOSIS — Z6835 Body mass index (BMI) 35.0-35.9, adult: Secondary | ICD-10-CM | POA: Diagnosis not present

## 2020-05-16 DIAGNOSIS — G4700 Insomnia, unspecified: Secondary | ICD-10-CM | POA: Diagnosis not present

## 2020-05-16 DIAGNOSIS — E1129 Type 2 diabetes mellitus with other diabetic kidney complication: Secondary | ICD-10-CM | POA: Diagnosis not present

## 2020-05-17 ENCOUNTER — Ambulatory Visit (INDEPENDENT_AMBULATORY_CARE_PROVIDER_SITE_OTHER): Payer: Medicare Other

## 2020-05-17 DIAGNOSIS — J309 Allergic rhinitis, unspecified: Secondary | ICD-10-CM

## 2020-05-25 ENCOUNTER — Encounter (INDEPENDENT_AMBULATORY_CARE_PROVIDER_SITE_OTHER): Payer: Self-pay | Admitting: *Deleted

## 2020-05-26 ENCOUNTER — Ambulatory Visit (INDEPENDENT_AMBULATORY_CARE_PROVIDER_SITE_OTHER): Payer: Medicare Other

## 2020-05-26 DIAGNOSIS — J309 Allergic rhinitis, unspecified: Secondary | ICD-10-CM | POA: Diagnosis not present

## 2020-05-30 DIAGNOSIS — E1151 Type 2 diabetes mellitus with diabetic peripheral angiopathy without gangrene: Secondary | ICD-10-CM | POA: Diagnosis not present

## 2020-05-30 DIAGNOSIS — E1140 Type 2 diabetes mellitus with diabetic neuropathy, unspecified: Secondary | ICD-10-CM | POA: Diagnosis not present

## 2020-06-01 ENCOUNTER — Other Ambulatory Visit: Payer: Self-pay

## 2020-06-01 ENCOUNTER — Ambulatory Visit (INDEPENDENT_AMBULATORY_CARE_PROVIDER_SITE_OTHER): Payer: Medicare Other | Admitting: Cardiology

## 2020-06-01 ENCOUNTER — Encounter: Payer: Self-pay | Admitting: Cardiology

## 2020-06-01 VITALS — BP 135/73 | HR 51 | Temp 98.4°F | Ht 71.0 in | Wt 254.6 lb

## 2020-06-01 DIAGNOSIS — R001 Bradycardia, unspecified: Secondary | ICD-10-CM

## 2020-06-01 DIAGNOSIS — E785 Hyperlipidemia, unspecified: Secondary | ICD-10-CM | POA: Diagnosis not present

## 2020-06-01 DIAGNOSIS — I2510 Atherosclerotic heart disease of native coronary artery without angina pectoris: Secondary | ICD-10-CM | POA: Diagnosis not present

## 2020-06-01 DIAGNOSIS — Z9861 Coronary angioplasty status: Secondary | ICD-10-CM

## 2020-06-01 DIAGNOSIS — R0609 Other forms of dyspnea: Secondary | ICD-10-CM

## 2020-06-01 DIAGNOSIS — Z789 Other specified health status: Secondary | ICD-10-CM

## 2020-06-01 DIAGNOSIS — I208 Other forms of angina pectoris: Secondary | ICD-10-CM

## 2020-06-01 DIAGNOSIS — I10 Essential (primary) hypertension: Secondary | ICD-10-CM

## 2020-06-01 DIAGNOSIS — R0600 Dyspnea, unspecified: Secondary | ICD-10-CM

## 2020-06-01 DIAGNOSIS — I878 Other specified disorders of veins: Secondary | ICD-10-CM

## 2020-06-01 NOTE — Patient Instructions (Addendum)
Medication Instructions:  No changes  *If you need a refill on your cardiac medications before your next appointment, please call your pharmacy*   Lab Work: Lipid fasting If you have labs (blood work) drawn today and your tests are completely normal, you will receive your results only by: Marland Kitchen MyChart Message (if you have MyChart) OR . A paper copy in the mail If you have any lab test that is abnormal or we need to change your treatment, we will call you to review the results.   Testing/Procedures: Not needed  Follow-Up: At [facility], you and your health needs are our priority.  As part of our continuing mission to provide you with exceptional heart care, we have created designated Provider Care Teams.  These Care Teams include your primary Cardiologist (physician) and Advanced Practice Providers (APPs -  Physician Assistants and Nurse Practitioners) who all work together to provide you with the care you need, when you need it.  We recommend signing up for the patient portal called "MyChart".  Sign up information is provided on this After Visit Summary.  MyChart is used to connect with patients for Virtual Visits (Telemedicine).  Patients are able to view lab/test results, encounter notes, upcoming appointments, etc.  Non-urgent messages can be sent to your provider as well.   To learn more about what you can do with MyChart, go to [email or web address].    Your next appointment:   6 month(s)  The format for your next appointment:   In Person  Provider:   Glenetta Hew, MD

## 2020-06-01 NOTE — Progress Notes (Signed)
Primary Care Provider: Redmond School, MD Cardiologist: No primary care provider on file. Electrophysiologist: None  Clinic Note: Chief Complaint  Patient presents with  . Follow-up    6 months.  Never did wear a monitor.  . Coronary Artery Disease    No angina    HPI:    CLEVER GERALDO is a 79 y.o. male with a PMH below who presents today for 50-month follow-up.  Claudell is a former patient of Dr. Sharlynn Oliphant dates back to 2009   2009: Non-STEMI -> PCI RCA and PTCA of ostial D1.  January 2013: PCI of LCx and Cutting Balloon PTCA of the D1.  August 2013-all areas patent.  February 2014 -> preop Myoview was nonischemic.  Problem List Items Addressed This Visit    CAD S/P percutaneous coronary angioplasty: 08/07/11 cutting balloon PTCA to 95% ostial diag. and 90%  proximal LCX.  prior cutting balloon to RCA  & ostium of ostial diag., 2009  - Primary (Chronic)   Essential hypertension (Chronic)   Hyperlipemia, intolerant to statins. Goal LDL < 70 (Chronic)   Statin intolerance (Chronic)   Chronic stable angina (HCC) - negative Myoview & no new Dz on Cath (Chronic)   Sinus bradycardia (Chronic)      LEGRAND LASSER was last seen on October 11, 2019 noticed in his heart rate was running a little slow.  He did note some fatigue and mild edema.  Bilateral ankle edema noted by his wife.  Concern for heart failure.  He also has mild exertional dyspnea. -> Insomnia treated with melatonin and PRN trazodone, but was not really getting good night sleep..  We weaned off metoprolol.  Continued amlodipine and lisinopril -> HCTZ 25 mg added  Event monitor ordered (he remembers that was sent out to him, but never used it, and does not know what happened to it)  Recent Hospitalizations:  Dec 06, 2019: ER visit after a sick exposure-employee had COVID-19 -> Covid testing performed.  No symptoms.  (- Covid)  March 08, 2020: [place],urgent care-1 to 2 weeks history of sneezing and  scratchy throat.  Request Covid test.  No fevers chills etc. => Covid negative  Reviewed  CV studies:    The following studies were reviewed today: (if available, images/films reviewed: From Epic Chart or Care Everywhere) . N/A:   Interval History:   EBB CARELOCK returns here today for follow-up.  He indicates he is activity level is limited by his psoriatic arthritis.  He is not really able to this.  He does try to walk some, but rarely does any true exercise. He never did wearing the monitor that has been ordered.  He notes mild end of day swelling he also has occasional nighttime cramping.  He otherwise has been relatively stable cardiac standpoint with no significant angina or heart failure symptoms.  CV Review of Symptoms (Summary) -: positive for - Mostly because of deconditioning and arthritis pains he does get some exertional dyspnea.  Ankle swelling seems to be pretty stable since starting HCTZ. negative for - chest pain, irregular heartbeat, orthopnea, palpitations, paroxysmal nocturnal dyspnea, rapid heart rate, shortness of breath or Lightheadedness or dizziness, syncope/near syncope, TIA/amaurosis fugax, claudication  The patient does not have symptoms concerning for COVID-19 infection (fever, chills, cough, or new shortness of breath).   REVIEWED OF SYSTEMS   Review of Systems  Constitutional: Positive for malaise/fatigue (Just noticed some exercise intolerance, partly because of his arthritis pains). Negative for weight  loss (Not as much weight gain, just unable to lose.).  HENT: Negative for nosebleeds.   Respiratory: Negative for cough and shortness of breath.   Gastrointestinal: Negative for abdominal pain, blood in stool and melena.  Genitourinary: Negative for hematuria.  Musculoskeletal: Positive for back pain and joint pain (Extensive psoriatic arthritis.  Hips, knees and ankles). Negative for falls.  Neurological: Positive for dizziness (Sometimes if he stands up  too quickly) and weakness (Legs do feel weak because of arthritis). Negative for focal weakness.  Psychiatric/Behavioral: Negative for memory loss. The patient has insomnia (Still has insomnia.). The patient is not nervous/anxious.   --> He takes a medication to help with sleep.  Did not mention much about this today.  I have reviewed and (if needed) personally updated the patient's problem list, medications, allergies, past medical and surgical history, social and family history.   PAST MEDICAL HISTORY   Past Medical History:  Diagnosis Date  . Allergic rhinitis   . Aortic valve sclerosis    Cause of murmur  . Arthritis of knee, left, needs total Knee in future with Dr. Wynelle Link  08/07/2011  . Bladder cancer ([place]) 11/2005  . CAD S/P percutaneous coronary angioplasty 08/07/2011   NEW 08/07/11 cutting balloon PTCA to 95% ostial diag. and 90%  proximal LCX.  prior cutting balloon and to RCA  & ostium of ostial diag; Last Cath 02/2012 - Patent Diag PTCA, RCA & Circ BMS stents. Normal EF & EDP; Myoview 2/14 no evidence of ischemia or infarction  . Diabetes mellitus type 2, controlled, with complications ([place])   . Diabetes mellitus without complication ([place])   . Dyslipidemia   . History of heart attack 11/2007   "mild; did not affect the heart muscle"; PCI to RCA, Cutting PTCA Diag ostium  . Hypertension   . Macular degeneration   . Macular degeneration of right eye    Now getting shots.   . Melanoma of skin ([place]) ~ 2005   "level 2; on my back"  . Myocardial infarction ([place]) 2012  . Osteoarthritis    Status post right knee arthroplasty, positive for staph infection. Pending right arthroplasty  . Portacath in place 08/11/2012  . Statin intolerance 08/07/2011  . Stroke [facility]) 1980's   "had facial strokes; 2; about a year apart; never did find what caused them"    PAST SURGICAL HISTORY   Past Surgical History:  Procedure Laterality Date  . BIOPSY  05/28/2019   Procedure: BIOPSY;  Surgeon: Rogene Houston, MD;  Location: [facility];  Service: Endoscopy;;  cecum  . CARDIAC CATHETERIZATION  August '13   Widely patent RCA and LCx stents. Also patent PTCA site to D1, ~40-50% D2. Normal EF, normal EDP  . CATARACT EXTRACTION W/ INTRAOCULAR LENS IMPLANT  ~ 2010   right  . CATARACT EXTRACTION W/PHACO Left 11/21/2016   Procedure: CATARACT EXTRACTION PHACO AND INTRAOCULAR LENS PLACEMENT (IOC) CDE - 11.70 ;  Surgeon: Tonny Branch, MD;  Location: [facility] ORS;  Service: Ophthalmology;  Laterality: Left;  left  . cold cup removal bladder lesion  11/2005   "malignant"  . COLONOSCOPY N/A 12/09/2014   Procedure: COLONOSCOPY;  Surgeon: Rogene Houston, MD;  Location: [facility];  Service: Endoscopy;  Laterality: N/A;  955 - moved to 8:30 - Ann to notify pt  . COLONOSCOPY WITH PROPOFOL N/A 05/28/2019   Procedure: COLONOSCOPY WITH PROPOFOL;  Surgeon: Rogene Houston, MD;  [facility];  Service: Endoscopy;  Laterality:  N/A;  . CORONARY ANGIOPLASTY WITH STENT PLACEMENT  11/2007   Mid RCA - Driver BMS 3.5 mm x 15 mm; 2.0 mm Cutting Balloon PTCA - ostial D1  . CORONARY ANGIOPLASTY WITH STENT PLACEMENT  08/07/11   Abnormal TM Myoview - for exertional throat discomfort: Patent RCA stent, 90% circumflex -- Vision BMS 3.5 mm x 12 mm --> 4.2 mm. Ostial D2 95% -- 2.25 mm Cutting Balloon PTCA  . DOPPLER ECHOCARDIOGRAPHY  May 2009   Normal EF, impaired relaxation, with sclerotic aortic valve. No stenosis.  . INGUINAL HERNIA REPAIR  ~ 1970   left  . KNEE ARTHROSCOPY  02/20/11   left  . LEFT HEART CATHETERIZATION WITH CORONARY ANGIOGRAM N/A 08/07/2011   Procedure: LEFT HEART CATHETERIZATION WITH CORONARY ANGIOGRAM;  Surgeon: Leonie Man, MD;  Location: [facility] CATH LAB;  Service: Cardiovascular;  Laterality: N/A;  . LEFT HEART CATHETERIZATION WITH CORONARY ANGIOGRAM N/A 03/06/2012   Procedure: LEFT HEART CATHETERIZATION WITH CORONARY ANGIOGRAM;  Surgeon: Leonie Man, MD;  Location: [facility] CATH LAB;  Service:  Cardiovascular;  Laterality: N/A;  . NM MYOVIEW LTD  Jan. 31, 2014   Low risk, EF 49%, improved from prior. Small apical and apical septal defect, fixed likely artifact no skin or infarction.  Marland Kitchen PARTIAL KNEE ARTHROPLASTY  06/2003   right  . PERCUTANEOUS CORONARY STENT INTERVENTION (PCI-S)  08/07/2011   Procedure: PERCUTANEOUS CORONARY STENT INTERVENTION (PCI-S);  Surgeon: Leonie Man, MD;  Location: [facility] CATH LAB;  Service: Cardiovascular;;  . POLYPECTOMY  05/28/2019   Procedure: POLYPECTOMY;  Surgeon: Rogene Houston, MD;  Location: [facility];  Service: Endoscopy;;  colon  . TONSILLECTOMY     "as a child"  . TOTAL KNEE ARTHROPLASTY  11/2003   right x2 and one on left- Total of 3.  . TOTAL KNEE ARTHROPLASTY Left 09/28/2012   Procedure: TOTAL KNEE ARTHROPLASTY;  Surgeon: Gearlean Alf, MD;  Location: [facility] ORS;  Service: Orthopedics;  Laterality: Left;    Immunization History  Administered Date(s) Administered  . Influenza-Unspecified 05/29/2018  . Tdap 02/01/2017    MEDICATIONS/ALLERGIES   Current Meds  Medication Sig  . acetaminophen (TYLENOL) 650 MG CR tablet Take 650 mg by mouth every 8 (eight) hours as needed for pain.  Marland Kitchen amLODipine (NORVASC) 10 MG tablet TAKE 1 TABLET(10 MG) BY MOUTH DAILY  . Ascorbic Acid (VITAMIN C) 1000 MG tablet Take 1,000 mg by mouth daily.  Marland Kitchen azelastine (ASTELIN) 0.1 % nasal spray Place 2 sprays into both nostrils 2 (two) times daily.  . budesonide (PULMICORT) 0.5 MG/2ML nebulizer solution FLUSH 2 ML MIXED WITH NASAL SALINE INTO THE NASAL PASSAGES DAILY AS DIRECTED.  Marland Kitchen Cholecalciferol (VITAMIN D) 50 MCG (2000 UT) tablet Take 2,000 Units by mouth daily.  . clobetasol (TEMOVATE) 0.05 % external solution Apply 1 application topically daily as needed (irritation).   . Coenzyme Q10 (CO Q-10) 100 MG CAPS Take 300 capsules by mouth 3 (three) times daily. (Patient taking differently: Take 100 capsules by mouth daily. )  . cyclobenzaprine (FLEXERIL) 10 MG  tablet Take 1 tablet (10 mg total) by mouth 2 (two) times daily as needed for muscle spasms.  Marland Kitchen EPINEPHrine 0.3 mg/0.3 mL IJ SOAJ injection Inject 0.3 mg into the muscle as needed for anaphylaxis.   Marland Kitchen EUCRISA 2 % OINT Apply 1 application topically 2 (two) times daily as needed.  . ezetimibe (ZETIA) 10 MG tablet TAKE 1 TABLET(10 MG) BY MOUTH DAILY (Patient taking differently: Take 10 mg  by mouth daily. )  . Fluticasone Propionate (XHANCE) 93 MCG/ACT EXHU Place 2 application into the nose 2 (two) times daily.  Marland Kitchen lisinopril (ZESTRIL) 40 MG tablet TAKE 1/2 TABLET BY MOUTH DAILY  . loperamide (IMODIUM) 2 MG capsule Take 1 capsule (2 mg total) by mouth 4 (four) times daily as needed for diarrhea or loose stools.  . magnesium gluconate (MAGONATE) 500 MG tablet Take 500 mg by mouth daily.  . metFORMIN (GLUCOPHAGE) 500 MG tablet Take 500 mg by mouth daily with breakfast.   . Multiple Vitamins-Minerals (PRESERVISION AREDS PO) Take 1 capsule by mouth 2 (two) times daily.   Marland Kitchen nystatin-triamcinolone (MYCOLOG II) cream Apply 1 application topically 2 (two) times daily as needed (dry skin).   . predniSONE (DELTASONE) 10 MG tablet Take 1 tablet (10 mg total) by mouth daily as needed (arthritis). 3,3, 2, 1 tabs  . simvastatin (ZOCOR) 10 MG tablet Take 1 tablet (10 mg total) by mouth daily at 6 PM.  . tacrolimus (PROTOPIC) 0.1 % ointment Apply topically 2 (two) times daily.  . tamsulosin (FLOMAX) 0.4 MG CAPS capsule Take 0.4 mg by mouth daily as needed (prostate).   . triazolam (HALCION) 0.125 MG tablet Take 1.5 tablets (0.1875 mg total) by mouth at bedtime. Pt takes 1.5 tabs at bedtime (Patient taking differently: Take 0.25 mg by mouth at bedtime. )    Allergies  Allergen Reactions  . Rosuvastatin Calcium Other (See Comments)    Myalgias, tired crestor -aches    SOCIAL HISTORY/FAMILY HISTORY   Reviewed in Epic:  Pertinent findings: None  OBJCTIVE -PE, EKG, labs   Wt Readings from Last 3 Encounters:   06/01/20 254 lb 9.6 oz (115.5 kg)  10/08/19 251 lb (113.9 kg)  09/08/19 242 lb (109.8 kg)    Physical Exam: BP 135/73   Pulse (!) 51   Temp 98.4 F (36.9 C)   Ht 5\' 11"  (1.803 m)   Wt 254 lb 9.6 oz (115.5 kg)   SpO2 91%   BMI 35.51 kg/m  Physical Exam Constitutional:      General: He is not in acute distress.    Appearance: Normal appearance. He is obese. He is not ill-appearing (Elderly, but not ill-appearing) or toxic-appearing.     Comments: Well-groomed.  HENT:     Head: Normocephalic and atraumatic.     Comments: Wears glasses Neck:     Vascular: No carotid bruit, hepatojugular reflux or JVD.  Cardiovascular:     Rate and Rhythm: Regular rhythm. Bradycardia present.  No extrasystoles are present.    Chest Wall: PMI is not displaced.     Pulses: Normal pulses and intact distal pulses.     Heart sounds: Murmur heard.  Medium-pitched harsh crescendo-decrescendo early systolic murmur is present with a grade of 1/6 at the upper right sternal border.  No friction rub. No gallop.   Pulmonary:     Effort: Pulmonary effort is normal. No respiratory distress.     Breath sounds: Normal breath sounds.     Comments: No W/R/R Chest:     Chest wall: No tenderness.  Abdominal:     General: Bowel sounds are normal. There is no distension.     Palpations: Abdomen is soft. There is no mass.     Comments: Unable to assess HSM.  Musculoskeletal:        General: Swelling (Trace ankle/pedal edema.) present.     Cervical back: Normal range of motion.     Comments: Somewhat slow, antalgic  gait  Skin:    General: Skin is warm and dry.     Comments: Bilateral legs have chronic venous stasis changes.  Neurological:     General: No focal deficit present.     Mental Status: He is alert and oriented to person, place, and time. Mental status is at baseline.  Psychiatric:        Mood and Affect: Mood normal.        Behavior: Behavior normal.        Thought Content: Thought content  normal.        Judgment: Judgment normal.     Adult ECG Report  Rate: 51 ;  Rhythm: normal sinus rhythm, sinus arrhythmia and RBBB.  Otherwise normal axis, normal durations.;   Narrative Interpretation: Stable EKG.   Recent Labs: September 30, 2019: TC 138, TG 70, HDL 43, LDL 80.-   05/16/20:A1c  7.0. 03/30/20: Cr 0.76, K+ 3.9 Lab Results  Component Value Date   CHOL 150 03/20/2016   HDL 41 03/20/2016   LDLCALC 86 03/20/2016   TRIG 116 03/20/2016   CHOLHDL 3.7 03/20/2016   Lab Results  Component Value Date   CREATININE 0.82 03/30/2019   BUN 15 03/30/2019   NA 141 03/30/2019   K 3.6 03/30/2019   CL 100 03/30/2019   CO2 29 03/30/2019   Lab Results  Component Value Date   TSH 2.14 11/24/2015    ASSESSMENT/PLAN    Problem List Items Addressed This Visit    CAD S/P percutaneous coronary angioplasty: 08/07/11 cutting balloon PTCA to 95% ostial diag. and 90%  proximal LCX.  prior cutting balloon to RCA  & ostium of ostial diag., 2009  - Primary (Chronic)    Overall stable from prior standpoint.  No active angina.  He does have some fatigue and exercise intolerance is probably related to his deconditioning from his arthritis.  He does not endorse much different since stopping beta-blocker.  Plan:   Continue current dose of amlodipine and lisinopril along with Imdur.  He is on Zetia plus simvastatin (add low-dose because of amlodipine) low threshold to consider switching to atorvastatin at 10 to 20 mg depending on follow-up labs..  Because of his history of macular bleeding, we have stopped antiplatelet agent.  Is now no longer on aspirin or Plavix. ->  Would like to potentially get her back on aspirin 81 mg.      Relevant Orders   EKG 12-Lead (Completed)   Lipid panel   Essential hypertension (Chronic)    Borderline elevated pressure today.  Seems to doing pretty well and tolerating HCTZ.  Would not want to be overly aggressive because of his tendency toward orthostatic  hypotension. Continue current dose of amlodipine plan lisinopril.      Hyperlipemia, intolerant to statins. Goal LDL < 70 (Chronic)    He was intolerant of rosuvastatin in the past, LDL has been 78 and 80 on the combination of simvastatin and Zetia.    I will check a new set of lipid panel labs.  If they are still not at goal, need to consider either switching him to atorvastatin, or potentially consider adding Nexletol.  -> To get this done, would probably need to have him be seen in the [facility] lipid clinic.      Relevant Orders   EKG 12-Lead (Completed)   Lipid panel   Statin intolerance (Chronic)    I had referred him to [facility] 2 years ago, but he never went.  He is on Zetia plus simvastatin.  Based on his neck symptomatic, we may want to try to switch her to atorvastatin.  He said Crestor because of symptoms.  Perhaps a reduced dose of atorvastatin will be tolerated. Otherwise may want to consider adding Nexletol (and potentially converting to Nexlizet)-nothing to do this I would ask him to go back to [facility].      Relevant Orders   Lipid panel   Chronic stable angina (HCC) - negative Myoview & no new Dz on Cath (Chronic)    She is pretty asymptomatic now even no longer on beta-blocker.  We did find out that he had been taking his Imdur 30 mg.    We will continue Imdur and amlodipine.      Relevant Orders   EKG 12-Lead (Completed)   Venous stasis of both lower extremities (Chronic)    Symptoms seem to be pretty stable.  Back did continue to recommend support stockings.  He acknowledges, but is probably not going to rhythm.  He is taking HCTZ a full 25 mg daily.  Also instructed him to elevate his feet examined today along with doing foot and ankle exercises..      DOE (dyspnea on exertion) (Chronic)    At this point, I suspect is more related to obesity deconditioning but with inability to exercise due to arthritis.  Did not really notice much of a difference since  stopping beta-blocker.      Sinus bradycardia (Chronic)    He is still bradycardic without being on beta-blocker.  No obvious symptoms to suggest chronotropic incompetence.  He never wore the monitor, does not seem to be interested in wearing it.  Continue to monitor.  No syncope or near syncope.      Relevant Orders   EKG 12-Lead (Completed)       COVID-19 Education: The signs and symptoms of COVID-19 were discussed with the patient and how to seek care for testing (follow up with PCP or arrange E-visit).   The importance of social distancing and COVID-19 vaccination was discussed today. 3 min The patient is practicing social distancing & Masking.   I spent a total of 11minutes with the patient spent in direct patient consultation.  Additional time spent with chart review  / charting (studies, outside notes, etc): 12 Total Time: 33 min   Current medicines are reviewed at length with the patient today.  (+/- concerns) n/a  This visit occurred during the SARS-CoV-2 public health emergency.  Safety protocols were in place, including screening questions prior to the visit, additional usage of staff PPE, and extensive cleaning of exam room while observing appropriate contact time as indicated for disinfecting solutions.  Notice: This dictation was prepared with Dragon dictation along with smaller phrase technology. Any transcriptional errors that result from this process are unintentional and may not be corrected upon review.  Patient Instructions / Medication Changes & Studies & Tests Ordered   Patient Instructions  Medication Instructions:  No changes  *If you need a refill on your cardiac medications before your next appointment, please call your pharmacy*   Lab Work: Lipid fasting If you have labs (blood work) drawn today and your tests are completely normal, you will receive your results only by: Marland Kitchen MyChart Message (if you have MyChart) OR . A paper copy in the mail If you  have any lab test that is abnormal or we need to change your treatment, we will call you to review the results.  Testing/Procedures: Not needed  Follow-Up: At [facility], you and your health needs are our priority.  As part of our continuing mission to provide you with exceptional heart care, we have created designated Provider Care Teams.  These Care Teams include your primary Cardiologist (physician) and Advanced Practice Providers (APPs -  Physician Assistants and Nurse Practitioners) who all work together to provide you with the care you need, when you need it.  We recommend signing up for the patient portal called "MyChart".  Sign up information is provided on this After Visit Summary.  MyChart is used to connect with patients for Virtual Visits (Telemedicine).  Patients are able to view lab/test results, encounter notes, upcoming appointments, etc.  Non-urgent messages can be sent to your provider as well.   To learn more about what you can do with MyChart, go to [email or web address].    Your next appointment:   6 month(s)  The format for your next appointment:   In Person  Provider:   Glenetta Hew, MD    Studies Ordered:   Orders Placed This Encounter  Procedures  . Lipid panel  . EKG 12-Lead     Glenetta Hew, M.D., M.S. Interventional Cardiologist   Pager # [phone] Phone # [phone] [address] [place], [place] [address]   Thank you for choosing Heartcare at [facility]!!

## 2020-06-02 DIAGNOSIS — M2550 Pain in unspecified joint: Secondary | ICD-10-CM | POA: Diagnosis not present

## 2020-06-02 DIAGNOSIS — L4059 Other psoriatic arthropathy: Secondary | ICD-10-CM | POA: Diagnosis not present

## 2020-06-02 DIAGNOSIS — L401 Generalized pustular psoriasis: Secondary | ICD-10-CM | POA: Diagnosis not present

## 2020-06-02 DIAGNOSIS — M542 Cervicalgia: Secondary | ICD-10-CM | POA: Diagnosis not present

## 2020-06-07 ENCOUNTER — Ambulatory Visit (INDEPENDENT_AMBULATORY_CARE_PROVIDER_SITE_OTHER): Payer: Medicare Other

## 2020-06-07 DIAGNOSIS — J309 Allergic rhinitis, unspecified: Secondary | ICD-10-CM

## 2020-06-10 ENCOUNTER — Encounter: Payer: Self-pay | Admitting: Cardiology

## 2020-06-10 NOTE — Assessment & Plan Note (Signed)
Symptoms seem to be pretty stable.  Back did continue to recommend support stockings.  He acknowledges, but is probably not going to rhythm.  He is taking HCTZ a full 25 mg daily.  Also instructed him to elevate his feet examined today along with doing foot and ankle exercises.Marland Kitchen

## 2020-06-10 NOTE — Assessment & Plan Note (Signed)
He is still bradycardic without being on beta-blocker.  No obvious symptoms to suggest chronotropic incompetence.  He never wore the monitor, does not seem to be interested in wearing it.  Continue to monitor.  No syncope or near syncope.

## 2020-06-10 NOTE — Assessment & Plan Note (Addendum)
I had referred him to [facility] 2 years ago, but he never went.  He is on Zetia plus simvastatin.  Based on his neck symptomatic, we may want to try to switch her to atorvastatin.  He said Crestor because of symptoms.  Perhaps a reduced dose of atorvastatin will be tolerated. Otherwise may want to consider adding Nexletol (and potentially converting to Nexlizet)-nothing to do this I would ask him to go back to [facility].

## 2020-06-10 NOTE — Assessment & Plan Note (Signed)
Overall stable from prior standpoint.  No active angina.  He does have some fatigue and exercise intolerance is probably related to his deconditioning from his arthritis.  He does not endorse much different since stopping beta-blocker.  Plan:   Continue current dose of amlodipine and lisinopril along with Imdur.  He is on Zetia plus simvastatin (add low-dose because of amlodipine) low threshold to consider switching to atorvastatin at 10 to 20 mg depending on follow-up labs..  Because of his history of macular bleeding, we have stopped antiplatelet agent.  Is now no longer on aspirin or Plavix. ->  Would like to potentially get her back on aspirin 81 mg.

## 2020-06-10 NOTE — Assessment & Plan Note (Signed)
Borderline elevated pressure today.  Seems to doing pretty well and tolerating HCTZ.  Would not want to be overly aggressive because of his tendency toward orthostatic hypotension. Continue current dose of amlodipine plan lisinopril.

## 2020-06-10 NOTE — Assessment & Plan Note (Signed)
She is pretty asymptomatic now even no longer on beta-blocker.  We did find out that he had been taking his Imdur 30 mg.    We will continue Imdur and amlodipine.

## 2020-06-10 NOTE — Assessment & Plan Note (Addendum)
At this point, I suspect is more related to obesity deconditioning but with inability to exercise due to arthritis.  Did not really notice much of a difference since stopping beta-blocker.

## 2020-06-10 NOTE — Assessment & Plan Note (Addendum)
He was intolerant of rosuvastatin in the past, LDL has been 78 and 80 on the combination of simvastatin and Zetia.    I will check a new set of lipid panel labs.  If they are still not at goal, need to consider either switching him to atorvastatin, or potentially consider adding Nexletol.  -> To get this done, would probably need to have him be seen in the [facility] lipid clinic.

## 2020-06-16 DIAGNOSIS — H353221 Exudative age-related macular degeneration, left eye, with active choroidal neovascularization: Secondary | ICD-10-CM | POA: Diagnosis not present

## 2020-06-19 DIAGNOSIS — L03031 Cellulitis of right toe: Secondary | ICD-10-CM | POA: Diagnosis not present

## 2020-06-19 DIAGNOSIS — L600 Ingrowing nail: Secondary | ICD-10-CM | POA: Diagnosis not present

## 2020-06-19 DIAGNOSIS — M79671 Pain in right foot: Secondary | ICD-10-CM | POA: Diagnosis not present

## 2020-06-19 DIAGNOSIS — M79674 Pain in right toe(s): Secondary | ICD-10-CM | POA: Diagnosis not present

## 2020-06-20 DIAGNOSIS — E118 Type 2 diabetes mellitus with unspecified complications: Secondary | ICD-10-CM | POA: Diagnosis not present

## 2020-06-20 DIAGNOSIS — Z1389 Encounter for screening for other disorder: Secondary | ICD-10-CM | POA: Diagnosis not present

## 2020-06-20 DIAGNOSIS — H359 Unspecified retinal disorder: Secondary | ICD-10-CM | POA: Diagnosis not present

## 2020-06-20 DIAGNOSIS — L4053 Psoriatic spondylitis: Secondary | ICD-10-CM | POA: Diagnosis not present

## 2020-06-20 DIAGNOSIS — Z681 Body mass index (BMI) 19 or less, adult: Secondary | ICD-10-CM | POA: Diagnosis not present

## 2020-06-20 DIAGNOSIS — Z0000 Encounter for general adult medical examination without abnormal findings: Secondary | ICD-10-CM | POA: Diagnosis not present

## 2020-06-20 DIAGNOSIS — M1991 Primary osteoarthritis, unspecified site: Secondary | ICD-10-CM | POA: Diagnosis not present

## 2020-06-21 ENCOUNTER — Ambulatory Visit: Payer: Self-pay

## 2020-06-21 DIAGNOSIS — J309 Allergic rhinitis, unspecified: Secondary | ICD-10-CM

## 2020-06-26 DIAGNOSIS — J3089 Other allergic rhinitis: Secondary | ICD-10-CM | POA: Diagnosis not present

## 2020-06-26 NOTE — Progress Notes (Signed)
VIAL EXP 06-26-21

## 2020-07-02 ENCOUNTER — Other Ambulatory Visit: Payer: Self-pay | Admitting: Cardiology

## 2020-07-05 ENCOUNTER — Ambulatory Visit (INDEPENDENT_AMBULATORY_CARE_PROVIDER_SITE_OTHER): Payer: Medicare Other

## 2020-07-05 DIAGNOSIS — J309 Allergic rhinitis, unspecified: Secondary | ICD-10-CM

## 2020-07-10 IMAGING — MR MR LUMBAR SPINE W/O CM
4 of 5 series · 14 of 48 positions shown · non-contrast
Comparison: Lumbar MRI 10/13/2014.  Lumbar radiographs 06/25/2013.

CLINICAL DATA: 76-year-old male with 2 years of lumbar back pain.
No known injury or prior surgery.

EXAM:
MRI LUMBAR SPINE WITHOUT CONTRAST
TECHNIQUE: Multiplanar, multisequence MR imaging of the lumbar spine was
performed. No intravenous contrast was administered.

[Series 3: T2 · sagittal · 4.0mm · 0.73mm/px · 5 of 19 slices shown (1 of 2)]
[im 1/19]
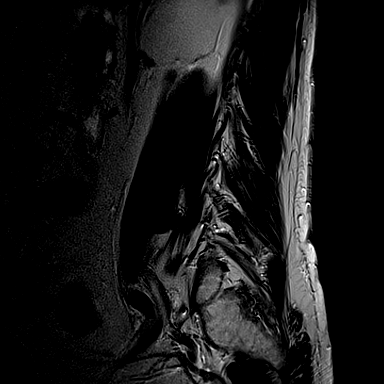
[im 4/19]
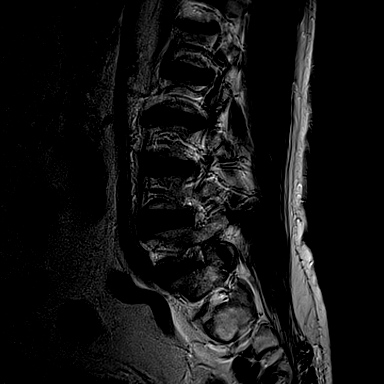
[im 8/19]
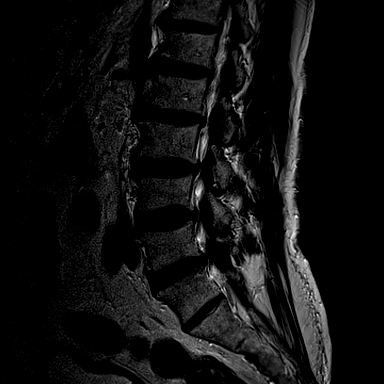
[im 11/19]
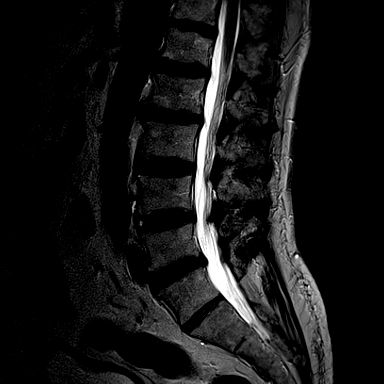
[im 19/19]
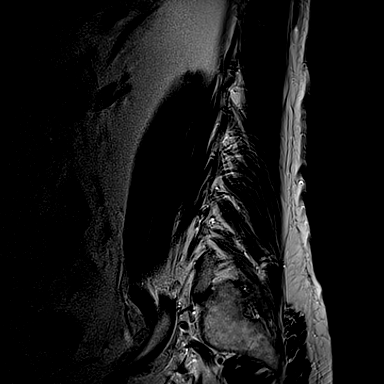

[Series 4: T1 · sagittal · 4.0mm · 0.37mm/px · 3 of 19 slices shown (1 of 2)]
[im 4/19]
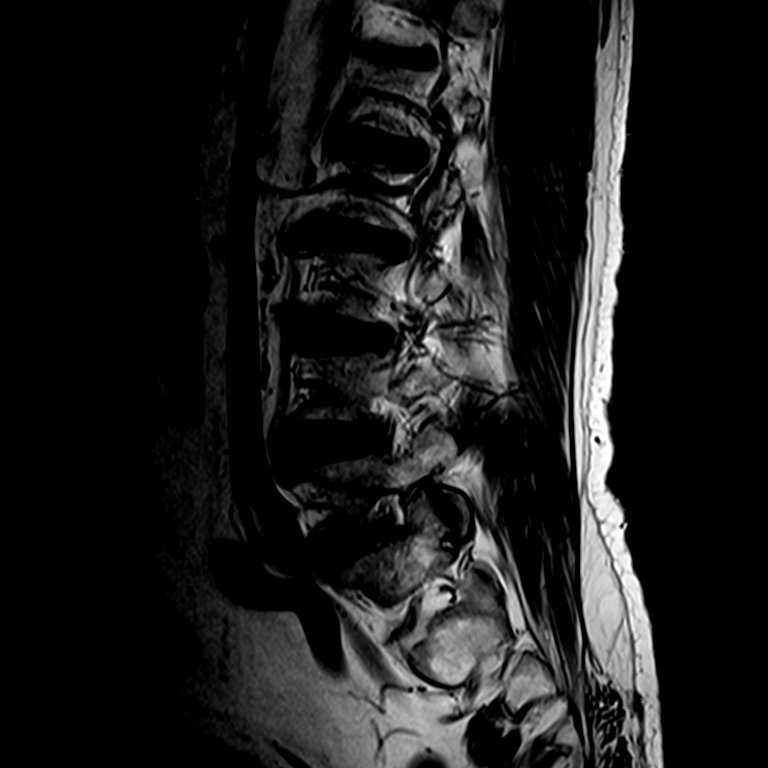
[im 10/19]
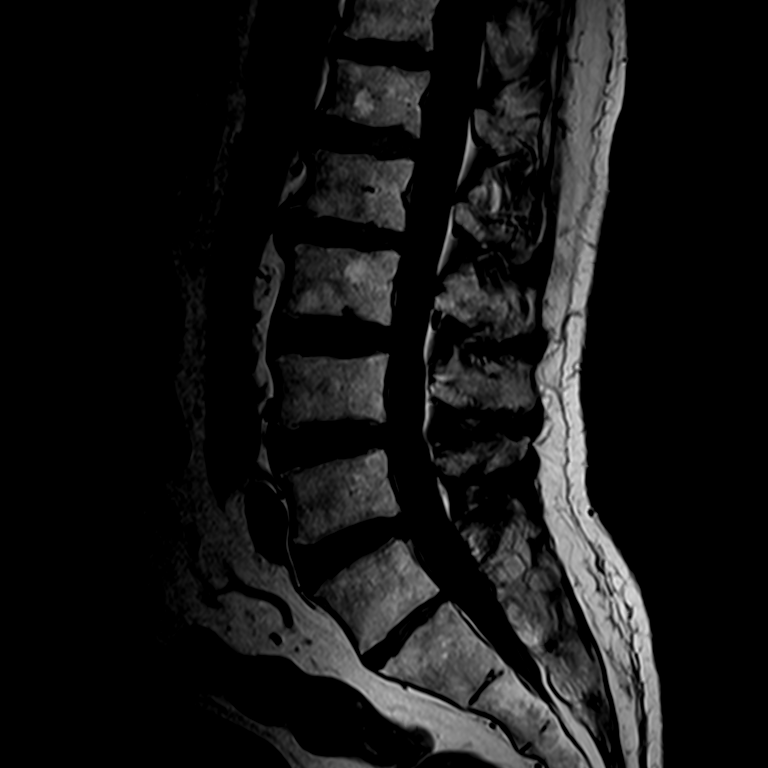
[im 16/19]
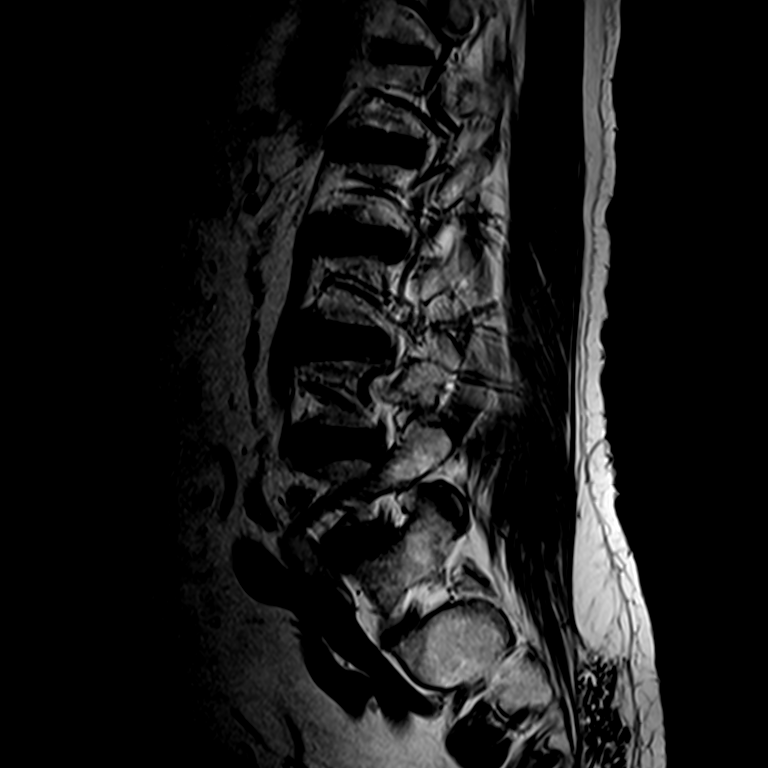

[Series 6: T2 · axial · 4.0mm · 0.21mm/px · z∈[-40,+94]mm · 3 of 40 slices shown (2 of 2)]
[im 7/40]
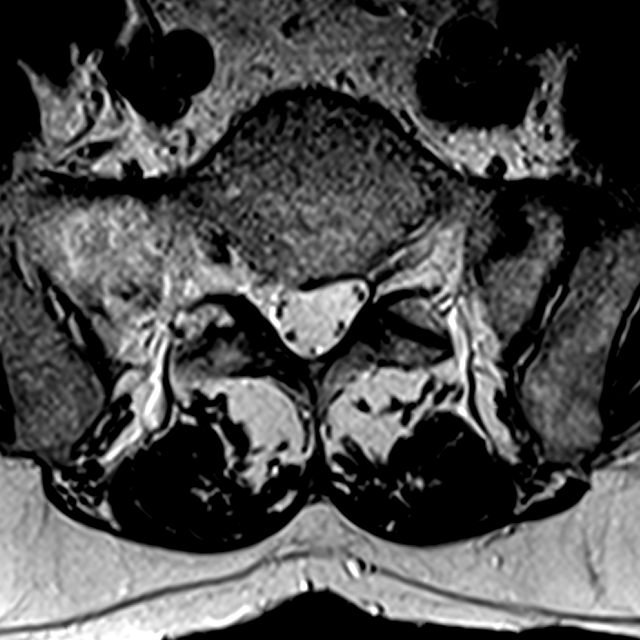
[im 22/40]
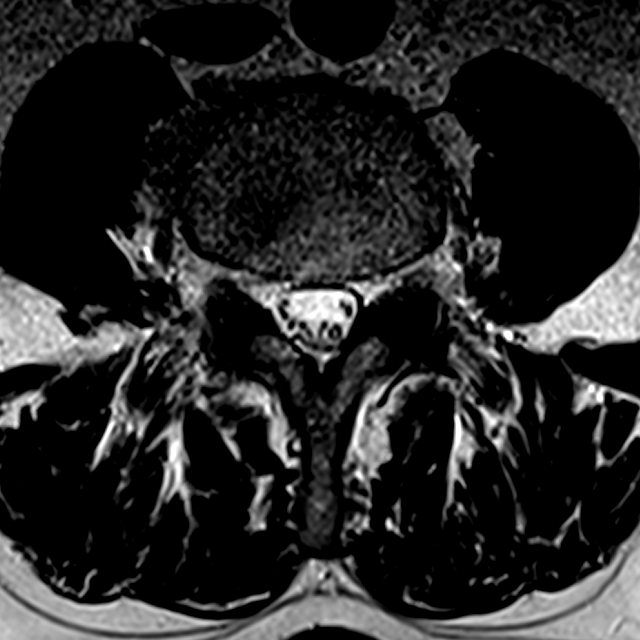
[im 34/40]
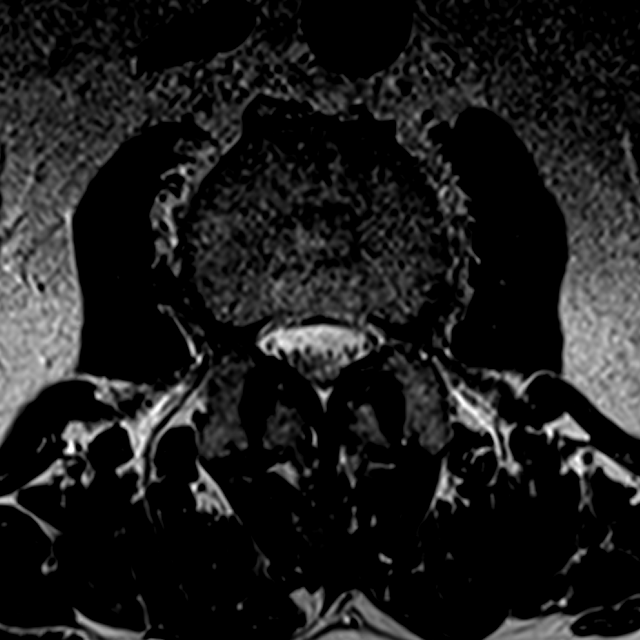

[Series 7: T1 · axial · 4.0mm · 0.21mm/px · z∈[-40,+94]mm · 3 of 40 slices shown (2 of 2)]
[im 7/40]
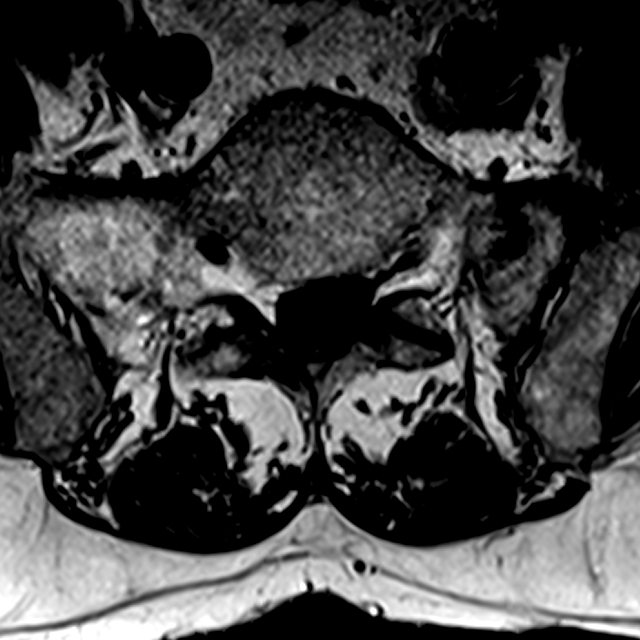
[im 22/40]
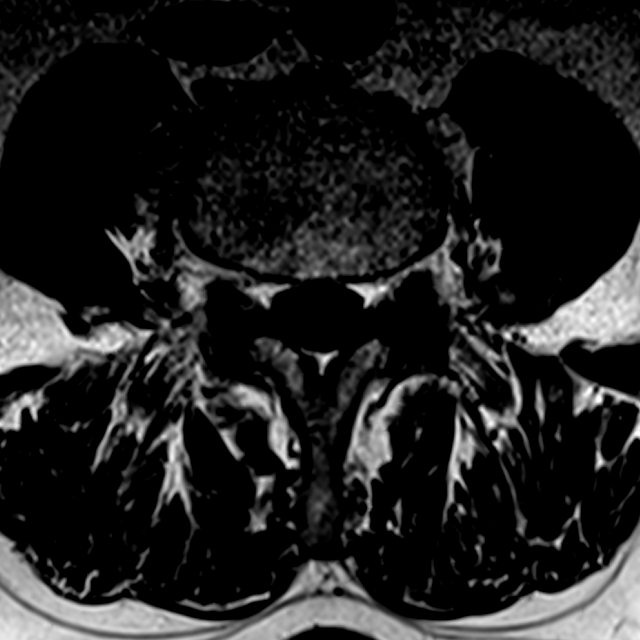
[im 34/40]
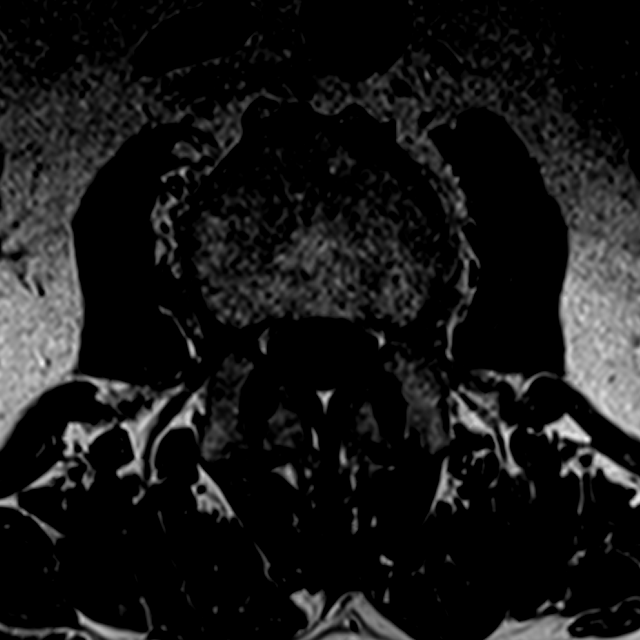

[14 of 48 positions shown; findings below may reference images not displayed]

FINDINGS: Segmentation: Transitional lumbosacral anatomy. The same numbering
system will be used as on the 9405 MRI which appears to designate
the lowest ribs as full size ribs at T12. This results in a
partially lumbarized S1 level and vestigial S1-S2 disc space.
Correlation with radiographs is recommended prior to any operative
intervention.

Alignment: Stable with preserved lumbar lordosis. Stable subtle
retrolisthesis of L5 on S1.

Vertebrae: No marrow edema or evidence of acute osseous abnormality.
Visualized bone marrow signal is within normal limits. Intact
visible sacrum and SI joints.

Conus medullaris and cauda equina: Conus extends to the L1 level. No
lower spinal cord or conus signal abnormality.

Paraspinal and other soft tissues: Negative.

Disc levels:

T12-L1:  Negative.

L1-L2: Stable minimal disc bulge. Mild if any bilateral L1 foraminal
stenosis is stable.

L2-L3: Chronic circumferential but mostly anterior and far lateral
disc bulging with mild facet hypertrophy. Stable mild bilateral L2
foraminal stenosis.

L3-L4: Chronic far lateral disc bulging and endplate spurring with
some foraminal involvement. Mild facet hypertrophy. Stable
borderline to mild spinal stenosis without convincing lateral recess
stenosis. Mild left and mild to moderate right L3 foraminal stenosis
appears stable.

L4-L5: Chronic circumferential but mostly far lateral disc bulging.
Mild to moderate facet and ligament flavum hypertrophy. New
borderline to mild spinal stenosis without lateral recess stenosis.
Mild-to-moderate right greater than left L4 foraminal stenosis
appears stable.

L5-S1: Chronic circumferential disc bulge with endplate spurring
eccentric to the left. Mild to moderate facet and ligament flavum
hypertrophy. No spinal stenosis. Mild right lateral recess stenosis
appears stable at the right S1 nerve level. Mild left and moderate
right L5 foraminal stenosis is stable.

S1-S2: Transitional.  No stenosis.
IMPRESSION: 1. Transitional lumbosacral anatomy. Same numbering system used as
on the 9405 MRI.
2. No acute osseous abnormality identified and mild for age lumbar
spine degeneration overall.
Stable to mildly progressed borderline to mild multifactorial spinal
stenosis at L3-L4 and L4-L5. Up to moderate neural foraminal
stenosis at these levels is stable.
Stable chronic right lateral recess and right neural foraminal
stenosis at L5-S1.

## 2020-07-14 DIAGNOSIS — L4059 Other psoriatic arthropathy: Secondary | ICD-10-CM | POA: Diagnosis not present

## 2020-07-14 DIAGNOSIS — L401 Generalized pustular psoriasis: Secondary | ICD-10-CM | POA: Diagnosis not present

## 2020-07-14 DIAGNOSIS — M2550 Pain in unspecified joint: Secondary | ICD-10-CM | POA: Diagnosis not present

## 2020-07-14 DIAGNOSIS — M542 Cervicalgia: Secondary | ICD-10-CM | POA: Diagnosis not present

## 2020-07-19 ENCOUNTER — Ambulatory Visit (INDEPENDENT_AMBULATORY_CARE_PROVIDER_SITE_OTHER): Payer: Medicare Other

## 2020-07-19 DIAGNOSIS — J309 Allergic rhinitis, unspecified: Secondary | ICD-10-CM | POA: Diagnosis not present

## 2020-07-26 ENCOUNTER — Ambulatory Visit (INDEPENDENT_AMBULATORY_CARE_PROVIDER_SITE_OTHER): Payer: Medicare Other

## 2020-07-26 DIAGNOSIS — J309 Allergic rhinitis, unspecified: Secondary | ICD-10-CM

## 2020-07-31 ENCOUNTER — Other Ambulatory Visit: Payer: Self-pay | Admitting: Cardiology

## 2020-07-31 DIAGNOSIS — H353221 Exudative age-related macular degeneration, left eye, with active choroidal neovascularization: Secondary | ICD-10-CM | POA: Diagnosis not present

## 2020-07-31 DIAGNOSIS — H353213 Exudative age-related macular degeneration, right eye, with inactive scar: Secondary | ICD-10-CM | POA: Diagnosis not present

## 2020-08-09 ENCOUNTER — Ambulatory Visit (INDEPENDENT_AMBULATORY_CARE_PROVIDER_SITE_OTHER): Payer: Medicare Other

## 2020-08-09 DIAGNOSIS — J309 Allergic rhinitis, unspecified: Secondary | ICD-10-CM | POA: Diagnosis not present

## 2020-08-15 DIAGNOSIS — E1151 Type 2 diabetes mellitus with diabetic peripheral angiopathy without gangrene: Secondary | ICD-10-CM | POA: Diagnosis not present

## 2020-08-15 DIAGNOSIS — E1140 Type 2 diabetes mellitus with diabetic neuropathy, unspecified: Secondary | ICD-10-CM | POA: Diagnosis not present

## 2020-08-16 ENCOUNTER — Encounter: Payer: Self-pay | Admitting: Allergy & Immunology

## 2020-08-16 ENCOUNTER — Ambulatory Visit (INDEPENDENT_AMBULATORY_CARE_PROVIDER_SITE_OTHER): Payer: Medicare Other | Admitting: Allergy & Immunology

## 2020-08-16 ENCOUNTER — Other Ambulatory Visit: Payer: Self-pay

## 2020-08-16 VITALS — BP 152/82 | HR 83 | Temp 97.8°F | Ht 71.0 in | Wt 254.0 lb

## 2020-08-16 DIAGNOSIS — L299 Pruritus, unspecified: Secondary | ICD-10-CM | POA: Diagnosis not present

## 2020-08-16 DIAGNOSIS — J3089 Other allergic rhinitis: Secondary | ICD-10-CM | POA: Diagnosis not present

## 2020-08-16 MED ORDER — CETIRIZINE HCL 10 MG PO TABS: 10.0000 mg | ORAL_TABLET | Freq: Every day | ORAL | 3 refills | Status: DC

## 2020-08-16 NOTE — Progress Notes (Signed)
FOLLOW UP  Date of Service/Encounter:  08/16/20   Assessment:   Itching - obtaining some labs today  Psoriasis  Seasonal and perennial allergic rhinitis (dust mites, mold, cockroach)- on allergen immunotherapy  Elevated blood pressure   Plan/Recommendations:   Itching Start Zyrtec 10 mg once a day to help with itching Stop Benadryl and Coricidin Schedule an appointment for skin testing to foods. Make sure to stay off all antihistamines 3 days prior Start with daily moisturizing routine with Eucerin, Cetaphil, or Cerave We will get some lab work to help figure out your itching. We will call you with results  Elevated blood pressure Your blood pressure is elevated today while in the office. Please schedule a follow up appointment with your primary care physician to discuss   Please let us know if this treatment plan is not working well for you Schedule a follow appointment in 1-2 weeks.    Subjective:   Arthur Lutz is a 80 y.o. male presenting today for follow up of  Chief Complaint  Patient presents with  . Rash    Possible eggs 2-3 months   . Urticaria    Arthur Lutz has a history of the following: Patient Active Problem List   Diagnosis Date Noted  . Psoriasis 04/23/2019  . Colitis 04/06/2019  . Diarrhea 04/06/2019  . Abdominal pain 03/29/2019  . Seasonal and perennial allergic rhinitis 01/06/2019  . Sinus bradycardia 08/29/2017  . Chronic stable angina ([place]) - negative Myoview & no new Dz on Cath 05/23/2015  . Insomnia due to stress 07/04/2014    Class: Chronic  . Venous stasis of both lower extremities 11/24/2013  . Abnormal biliary HIDA scan 09/15/2013  . Intestinal bacterial overgrowth 08/16/2013  . Bloating 08/16/2013  . Obesity (BMI 30.0-34.9) 01/30/2013  . Stiffness of joint, not elsewhere classified, lower leg 10/26/2012  . Weakness of left leg 10/26/2012  . Difficulty in walking 10/26/2012  . Postoperative anemia due to acute blood  loss 09/29/2012  . OA (osteoarthritis) of knee 09/28/2012  . Portacath in place 08/11/2012  . CAD S/P percutaneous coronary angioplasty: 08/07/11 cutting balloon PTCA to 95% ostial diag. and 90%  proximal LCX.  prior cutting balloon to RCA  & ostium of ostial diag., 2009  08/07/2011  . Essential hypertension 08/07/2011  . DOE (dyspnea on exertion) 08/07/2011  . Diabetes mellitus type 2, controlled, with complications ([place]) [id number]  . Hyperlipemia, intolerant to statins. Goal LDL < 70 08/07/2011  . Arthritis of knee, left, needs total Knee in future with Dr. Wynelle Link  08/07/2011  . Statin intolerance 08/07/2011    History obtained from: chart review and patient.  Arthur Lutz is a 80 y.o. male presenting for a follow up visit. He was last seen on 03/08/2020 by [name], FNP for perennial allergic rhinitis.  Since the last visit, he has done well for the most part.   Allergic Rhinitis Symptom History: He reports occasional rhinorrhea, post nasal drip, and nasal congestion with allergy injections per protocol, Coricidin as needed, azelastine nasal spray as needed, and budesonide with sinus rinse as needed. He is requesting a sample of Xhance. He reports Arthur Lutz is to expensive, but is very helpful when he is really congested.  Skin Symptom History: He reports for the past 3-4 months he has had pruritis. He has never been able to see any rashes or hives. The itching mainly occurs on his abdomen, sides of abdomen, back area, and the"belt area of the back bone." The  itching is always there, but some days are better than others. He has had this occur one other time 7-10 years ago,but it did not last this long. He has tried Technical sales engineer detergents with no help. This helped the last time he had this occur. he also thought that eggs might be causing this so he stopped eating eggs for 3-4 weeks with no change. No one else in the household has this. He has tried Benadryl and a powder that he does not know  the name of with no relief. He has not been on any new medications. He feels that his prurtius may be due to a food or medication. He takes prednisone occasionally for arthritis and this has not helped. He denies any insect stings/tick bites, new detergents or products, or being sick before this occured. The itching moves around, but he never sees a rash or hives. He denies any bruising or new foods. He does not use a daily lotion to moisturize. He will occasionally use a lotion on his hands.  He has not had a COVID-19 vaccine, but has had his influenza vaccine this year.  Otherwise, there have been no changes to his past medical history, surgical history, family history, or social history.    Review of Systems  Constitutional: Negative for chills and fever.  HENT:       Reports occasional rhinorrhea, nasal congestion and post nasal drip  Eyes:       Reports watery eyes due to dry eyes and macular degeneration  Respiratory: Negative for cough, shortness of breath and wheezing.   Cardiovascular: Negative for chest pain and palpitations.  Gastrointestinal: Negative for abdominal pain and heartburn.  Genitourinary: Negative for dysuria.  Skin: Positive for itching. Negative for rash.  Neurological: Positive for headaches.       Reports occasional sinus headaches  Endo/Heme/Allergies: Positive for environmental allergies.       Objective:   Blood pressure (!) 152/82, pulse 83, temperature 97.8 F (36.6 C), temperature source Temporal, height 5\' 11"  (1.803 m), weight 254 lb (115.2 kg), SpO2 97 %. Body mass index is 35.43 kg/m.   Physical Exam:  Physical Exam Constitutional:      Appearance: Normal appearance.  HENT:     Head: Normocephalic and atraumatic.     Comments: Pharynx normal. Eyes normal. Ears normal. Nose normal    Right Ear: Tympanic membrane, ear canal and external ear normal.     Left Ear: Tympanic membrane, ear canal and external ear normal.     Nose: Nose normal.      Mouth/Throat:     Mouth: Mucous membranes are moist.     Pharynx: Oropharynx is clear.  Eyes:     Conjunctiva/sclera: Conjunctivae normal.  Cardiovascular:     Rate and Rhythm: Regular rhythm.     Heart sounds: Normal heart sounds.  Pulmonary:     Breath sounds: Normal breath sounds.     Comments: Lungs clear to auscultation Musculoskeletal:     Cervical back: Neck supple.  Skin:    General: Skin is warm.     Comments: No rashes or urticarial lesions noted  Neurological:     Mental Status: He is alert and oriented to person, place, and time.  Psychiatric:        Mood and Affect: Mood normal.        Behavior: Behavior normal.        Judgment: Judgment normal.      Diagnostic studies: none  Spirometry:  N/A  Thank you for the opportunity to care for Grabiel. Please do not hesitate to contact us with any questions.  [name], FNP [facility] Nurse Practitioner's note and agree with the documented findings and plan of care. We briefly discussed the patient and developed a plan concurrently. Mr. Spainhour is going fairly well from an allergic rhinitis standpoint. He is tolerating his allergy injections without a problem. He does have continued pruritis and we are going to work this up a bit more aggressively than we have done in the past.    Salvatore Marvel, MD  [facility]

## 2020-08-16 NOTE — Patient Instructions (Addendum)
Itching Start Zyrtec 10 mg once a day to help with itching Stop Benadryl and Coricidin Schedule an appointment for skin testing to foods. Make sure to stay off all antihistamines 3 days prior Start with daily moisturizing routine with Eucerin, Cetaphil, or Cerave We will get some lab work to help figure out your itching. We will call you with results  Your blood pressure is elevated today while in the office. Please schedule a follow up appointment with your primary care physician to discuss  Please let us know if this treatment plan is not working well for you Schedule a follow appointment in 1-2 weeks.

## 2020-08-17 ENCOUNTER — Encounter: Payer: Self-pay | Admitting: Allergy & Immunology

## 2020-08-18 ENCOUNTER — Other Ambulatory Visit: Payer: Self-pay | Admitting: Cardiology

## 2020-08-21 DIAGNOSIS — L299 Pruritus, unspecified: Secondary | ICD-10-CM | POA: Diagnosis not present

## 2020-08-22 DIAGNOSIS — J310 Chronic rhinitis: Secondary | ICD-10-CM | POA: Diagnosis not present

## 2020-08-22 DIAGNOSIS — J342 Deviated nasal septum: Secondary | ICD-10-CM | POA: Diagnosis not present

## 2020-08-22 DIAGNOSIS — R070 Pain in throat: Secondary | ICD-10-CM | POA: Diagnosis not present

## 2020-08-22 DIAGNOSIS — J343 Hypertrophy of nasal turbinates: Secondary | ICD-10-CM | POA: Diagnosis not present

## 2020-08-23 ENCOUNTER — Ambulatory Visit: Payer: Self-pay

## 2020-08-25 ENCOUNTER — Ambulatory Visit (INDEPENDENT_AMBULATORY_CARE_PROVIDER_SITE_OTHER): Payer: Medicare Other

## 2020-08-25 DIAGNOSIS — J309 Allergic rhinitis, unspecified: Secondary | ICD-10-CM | POA: Diagnosis not present

## 2020-08-25 LAB — CBC WITH DIFFERENTIAL
Basophils Absolute: 0.1 10*3/uL (ref 0.0–0.2)
Basos: 1 %
EOS (ABSOLUTE): 0.1 10*3/uL (ref 0.0–0.4)
Eos: 2 %
Hematocrit: 43.8 % (ref 37.5–51.0)
Hemoglobin: 14.9 g/dL (ref 13.0–17.7)
Immature Grans (Abs): 0 10*3/uL (ref 0.0–0.1)
Immature Granulocytes: 1 %
Lymphocytes Absolute: 1.3 10*3/uL (ref 0.7–3.1)
Lymphs: 22 %
MCH: 31.6 pg (ref 26.6–33.0)
MCHC: 34 g/dL (ref 31.5–35.7)
MCV: 93 fL (ref 79–97)
Monocytes Absolute: 0.7 10*3/uL (ref 0.1–0.9)
Monocytes: 13 %
Neutrophils Absolute: 3.5 10*3/uL (ref 1.4–7.0)
Neutrophils: 61 %
RBC: 4.71 x10E6/uL (ref 4.14–5.80)
RDW: 12.5 % (ref 11.6–15.4)
WBC: 5.7 10*3/uL (ref 3.4–10.8)

## 2020-08-25 LAB — COMPREHENSIVE METABOLIC PANEL
ALT: 23 IU/L (ref 0–44)
AST: 12 IU/L (ref 0–40)
Albumin/Globulin Ratio: 2 (ref 1.2–2.2)
Albumin: 4.3 g/dL (ref 3.7–4.7)
Alkaline Phosphatase: 74 IU/L (ref 44–121)
BUN/Creatinine Ratio: 15 (ref 10–24)
BUN: 14 mg/dL (ref 8–27)
Bilirubin Total: 0.4 mg/dL (ref 0.0–1.2)
CO2: 27 mmol/L (ref 20–29)
Calcium: 9.3 mg/dL (ref 8.6–10.2)
Chloride: 100 mmol/L (ref 96–106)
Creatinine, Ser: 0.93 mg/dL (ref 0.76–1.27)
GFR calc Af Amer: 91 mL/min/{1.73_m2} (ref >59)
GFR calc non Af Amer: 79 mL/min/{1.73_m2} (ref >59)
Globulin, Total: 2.2 g/dL (ref 1.5–4.5)
Glucose: 130 mg/dL — ABNORMAL HIGH (ref 65–99)
Potassium: 4 mmol/L (ref 3.5–5.2)
Sodium: 143 mmol/L (ref 134–144)
Total Protein: 6.5 g/dL (ref 6.0–8.5)

## 2020-08-25 LAB — THYROID CASCADE PROFILE: TSH: 1.76 u[IU]/mL (ref 0.450–4.500)

## 2020-08-25 LAB — ALPHA-GAL PANEL
Allergen Lamb IgE: <0.1 kU/L
Beef IgE: <0.1 kU/L
IgE (Immunoglobulin E), Serum: 32 IU/mL (ref 6–495)
O215-IgE Alpha-Gal: 0.24 kU/L — AB
Pork IgE: <0.1 kU/L

## 2020-08-29 NOTE — Patient Instructions (Incomplete)
Itching Your skin testing to foods today was: Your lab work form 08/16/2020 Continue Zyrtec 10 mg once a day to help with itching Continue daily moisturizing with Eucerin, Cetaphil, or Cerave  Seasonal and perennial allergic rhinitis ( dust mite, mold, cockroach) Continue azelastine nasal spray 2 sprays each nostril twice a day as needed for runny nose/drainage Continue allergy injections per protocol and have access to epinephrine autoinjector Continue budesonide with sinus rinse as needed.  Elevated blood pressure Her blood pressure was elevated while in the office today.  Please schedule an appointment with your primary care physician to discuss this  Please let us know if this treatment plan is not working well for you Schedule a follow-up appointment in

## 2020-08-30 ENCOUNTER — Other Ambulatory Visit: Payer: Self-pay

## 2020-08-30 ENCOUNTER — Ambulatory Visit: Payer: Medicare Other | Admitting: Family

## 2020-08-30 NOTE — Progress Notes (Signed)
Please let Arthur Lutz know that his lab work to alpha gal was positive.  This means that he has an allergy to red meat.  Please have him avoid all mammalian meat, (beef, pork, lamb) and lets see if this helps with his itching. In case of an allergic reaction, give Benadryl 4 teaspoonfuls every 4 hours, and if life-threatening symptoms occur, inject with EpiPen 0.3 mg.  Thank you, [name], FNP

## 2020-08-31 DIAGNOSIS — M545 Low back pain, unspecified: Secondary | ICD-10-CM | POA: Diagnosis not present

## 2020-08-31 DIAGNOSIS — R3912 Poor urinary stream: Secondary | ICD-10-CM | POA: Diagnosis not present

## 2020-08-31 DIAGNOSIS — R972 Elevated prostate specific antigen [PSA]: Secondary | ICD-10-CM | POA: Diagnosis not present

## 2020-08-31 DIAGNOSIS — N401 Enlarged prostate with lower urinary tract symptoms: Secondary | ICD-10-CM | POA: Diagnosis not present

## 2020-09-06 ENCOUNTER — Ambulatory Visit (INDEPENDENT_AMBULATORY_CARE_PROVIDER_SITE_OTHER): Payer: Medicare Other

## 2020-09-06 DIAGNOSIS — J309 Allergic rhinitis, unspecified: Secondary | ICD-10-CM

## 2020-09-12 DIAGNOSIS — H31091 Other chorioretinal scars, right eye: Secondary | ICD-10-CM | POA: Diagnosis not present

## 2020-09-12 DIAGNOSIS — H353221 Exudative age-related macular degeneration, left eye, with active choroidal neovascularization: Secondary | ICD-10-CM | POA: Diagnosis not present

## 2020-09-12 DIAGNOSIS — H353213 Exudative age-related macular degeneration, right eye, with inactive scar: Secondary | ICD-10-CM | POA: Diagnosis not present

## 2020-09-12 DIAGNOSIS — H35422 Microcystoid degeneration of retina, left eye: Secondary | ICD-10-CM | POA: Diagnosis not present

## 2020-09-15 ENCOUNTER — Ambulatory Visit (INDEPENDENT_AMBULATORY_CARE_PROVIDER_SITE_OTHER): Payer: Medicare Other

## 2020-09-15 DIAGNOSIS — J309 Allergic rhinitis, unspecified: Secondary | ICD-10-CM | POA: Diagnosis not present

## 2020-09-15 DIAGNOSIS — M47812 Spondylosis without myelopathy or radiculopathy, cervical region: Secondary | ICD-10-CM | POA: Diagnosis not present

## 2020-09-21 DIAGNOSIS — Z85828 Personal history of other malignant neoplasm of skin: Secondary | ICD-10-CM | POA: Diagnosis not present

## 2020-09-21 DIAGNOSIS — L409 Psoriasis, unspecified: Secondary | ICD-10-CM | POA: Diagnosis not present

## 2020-09-21 DIAGNOSIS — D225 Melanocytic nevi of trunk: Secondary | ICD-10-CM | POA: Diagnosis not present

## 2020-09-21 DIAGNOSIS — Z85820 Personal history of malignant melanoma of skin: Secondary | ICD-10-CM | POA: Diagnosis not present

## 2020-09-26 NOTE — Patient Instructions (Addendum)
Itching Your skin testing to foods today was: Negative to selected foods Start Zyrtec 10 mg once a day to help with itching Start daily moisturizing with Eucerin, Cetaphil, or Cerave  Alpha gal allergy Avoid all red meats (beef, pork, lamb). In case of an allergic reaction, give Benadryl 4 teaspoonfuls every 4 hours, and if life-threatening symptoms occur, inject with EpiPen 0.3 mg. Call us in a week and let us know if avoiding these foods has helped  Seasonal and perennial allergic rhinitis ( dust mite, mold, cockroach) Hopefully using your medications below on a more consistent basis will help Continue azelastine nasal spray 2 sprays each nostril twice a day as needed for runny nose/drainage. Please use this on a more consistent basis and see if this helps Continue allergy injections per protocol and have access to epinephrine autoinjector Continue budesonide with sinus rinse as needed OR Xhance 1-2 sprays each nostril twice a day as needed. Please use one of these more consistent Use Zyrtec 10 mg as above Stop Benadryl and Coricidin  Please let us know if this treatment plan is not working well for you Schedule a follow-up appointment in 2 months or sooner

## 2020-09-27 ENCOUNTER — Other Ambulatory Visit: Payer: Self-pay | Admitting: Family

## 2020-09-27 ENCOUNTER — Other Ambulatory Visit: Payer: Self-pay

## 2020-09-27 ENCOUNTER — Encounter: Payer: Self-pay | Admitting: Family

## 2020-09-27 ENCOUNTER — Ambulatory Visit (INDEPENDENT_AMBULATORY_CARE_PROVIDER_SITE_OTHER): Payer: Medicare Other | Admitting: Family

## 2020-09-27 VITALS — BP 118/78 | HR 50 | Resp 18

## 2020-09-27 DIAGNOSIS — Z91018 Allergy to other foods: Secondary | ICD-10-CM | POA: Diagnosis not present

## 2020-09-27 DIAGNOSIS — J3089 Other allergic rhinitis: Secondary | ICD-10-CM

## 2020-09-27 DIAGNOSIS — L299 Pruritus, unspecified: Secondary | ICD-10-CM

## 2020-09-27 MED ORDER — AZELASTINE HCL 0.1 % NA SOLN: 2.0000 | Freq: Two times a day (BID) | NASAL | 5 refills | Status: DC

## 2020-09-27 NOTE — Progress Notes (Signed)
[place], [place] [address] Dept: [phone]  FOLLOW UP NOTE  Patient ID: Arthur Lutz, male    DOB: 22-Apr-1941  Age: 80 y.o. MRN: [id number] Date of Office Visit: 09/27/2020  Assessment  Chief Complaint: Allergy Testing  HPI Arthur Lutz is a 80 year old male who presents today for skin testing  to select foods.  He was last seen on August 16, 2020 by Dr. Ernst Bowler for itching and seasonal and perennial allergic rhinitis.  He reports that he did not ever receive a phone call about his lab results that he had drawn at the last office visit.  Instructed him that his alpha gal level was elevated some and that he needs to avoid all red meat including beef, pork, and lamb.  He reports that he eats pork daily for breakfast in the form of bacon.  He has not had any beef in the past 10 days and reports that his itching has been minimal.  He will still have itching with no rash or hives on the sides of his abdomen and around his waistline.  He did not ever try using a daily moisturizer.  He also did not start Zyrtec 10 mg once a day to help with the itching.  Instructed him one the importance of avoiding all red meats and that this could possibly cause an anaphylactic reaction.  Perennial allergic rhinitis is reported as not well controlled with occasional Zyrtec 10 mg once a day, occasional Benadryl and Coricidin as needed.  Also, he will either use XHANCE nasal spray or sinus rinse with budesonide as needed. He reports clear rhinorrhea, nasal congestion, and post nasal drip.  He has concerns of taking these medications daily because they can cause liver and kidney problems.  He reports that he does not have any known kidney or liver issues.  He recieves injections once a week and does not feel that they are helpful.  He denies any large local reactions.   Drug Allergies:  Allergies  Allergen Reactions  . Rosuvastatin Calcium Other (See Comments)    Myalgias, tired  crestor -aches    Review of Systems: Review of Systems  Constitutional: Negative for chills and fever.  HENT:       Reports clear rhinorrhea, nasal congestion, and post nasal drip  Eyes:       Reports itchy watery eyes. Has diagnosis of macular degeneration and dry eyes  Respiratory: Negative for cough, shortness of breath and wheezing.   Cardiovascular: Negative for chest pain and palpitations.  Gastrointestinal: Negative for abdominal pain, heartburn and melena.  Genitourinary: Negative for dysuria.       Reports enlarged prostate  Skin: Positive for itching. Negative for rash.  Neurological: Positive for headaches.       Reports occasional headaches  Endo/Heme/Allergies: Positive for environmental allergies.    Physical Exam: BP 118/78 (BP Location: Left Arm, Patient Position: Sitting, Cuff Size: Large)   Pulse (!) 50   Resp 18   SpO2 98%    Physical Exam Constitutional:      Appearance: Normal appearance.  HENT:     Head: Normocephalic and atraumatic.     Comments: Pharynx normal. Eyes normal. Ears normal. Nose normal    Right Ear: Tympanic membrane, ear canal and external ear normal.     Left Ear: Tympanic membrane, ear canal and external ear normal.     Nose: Nose normal.     Mouth/Throat:     Mouth:  Mucous membranes are moist.     Pharynx: Oropharynx is clear.  Eyes:     Conjunctiva/sclera: Conjunctivae normal.  Cardiovascular:     Rate and Rhythm: Regular rhythm.     Heart sounds: Normal heart sounds.  Pulmonary:     Effort: Pulmonary effort is normal.     Breath sounds: Normal breath sounds.     Comments: Lungs clear to auscultation Musculoskeletal:     Cervical back: Neck supple.  Skin:    General: Skin is warm.     Comments: No rashes or hives noted  Neurological:     Mental Status: He is alert and oriented to person, place, and time.  Psychiatric:        Mood and Affect: Mood normal.        Behavior: Behavior normal.        Thought Content:  Thought content normal.        Judgment: Judgment normal.     Diagnostics:  Percutaneous skin testing to select foods were negative with a good histamine control.  Food Adult Perc - 09/27/20 0900    Time Antigen Placed 0920    Allergen Manufacturer Lavella Hammock    Location Back    Number of allergen test 36     Control-buffer 50% Glycerol Negative    Control-Histamine 1 mg/ml 2+    1. Peanut Negative    3. Wheat Negative    5. Milk, cow Negative    6. Egg White, Chicken Negative    8. Shellfish Mix Negative    11. Pecan Food Negative    16. Coconut Negative    18. Catfish Negative    21. Tuna Negative    22. Salmon Negative    25. Shrimp Negative    31. Oat  Negative    34. Rice Negative    37. Pork Negative    38. [place] Meat Negative    39. Chicken Meat Negative    40. Beef Negative    41. Lamb Negative    42. Tomato Negative    43. White Potato Negative    44. Sweet Potato Negative    45. Pea, Green/English Negative    49. Onion Negative    50. Cabbage Negative    51. Carrots Negative    53. Corn Negative    55. Grape (White seedless) Negative    56. Orange  Negative    57. Banana Negative    58. Apple Negative    63. Pineapple Negative    70. Garlic Negative    71. Pepper, black Negative    72. Mustard Negative            Assessment and Plan: 1. Itching   2. Allergy to alpha-gal   3. Perennial allergic rhinitis     No orders of the defined types were placed in this encounter.   Patient Instructions  Itching Your skin testing to foods today was: Start Zyrtec 10 mg once a day to help with itching Start daily moisturizing with Eucerin, Cetaphil, or Cerave  Alpha gal allergy Avoid all red meats (beef, pork, lamb). In case of an allergic reaction, give Benadryl 4 teaspoonfuls every 4 hours, and if life-threatening symptoms occur, inject with EpiPen 0.3 mg. Call us in a week and let us know if avoiding these foods has helped  Seasonal and perennial  allergic rhinitis ( dust mite, mold, cockroach) Hopefully using your medications below on a more consistent basis will help Continue azelastine nasal  spray 2 sprays each nostril twice a day as needed for runny nose/drainage. Please use this on a more consistent basis and see if this helps Continue allergy injections per protocol and have access to epinephrine autoinjector Continue budesonide with sinus rinse as needed OR Xhance 1-2 sprays each nostril twice a day as needed. Please use one of these more consistent Use Zyrtec 10 mg as above Stop Benadryl and Coricidin  Please let us know if this treatment plan is not working well for you Schedule a follow-up appointment in 2 months or sooner    Return in about 2 months (around 11/27/2020), or if symptoms worsen or fail to improve.    Thank you for the opportunity to care for this patient.  Please do not hesitate to contact me with questions.  [name], FNP [facility]

## 2020-09-29 ENCOUNTER — Ambulatory Visit (INDEPENDENT_AMBULATORY_CARE_PROVIDER_SITE_OTHER): Payer: Medicare Other

## 2020-09-29 DIAGNOSIS — J309 Allergic rhinitis, unspecified: Secondary | ICD-10-CM

## 2020-09-30 ENCOUNTER — Other Ambulatory Visit: Payer: Self-pay | Admitting: Cardiology

## 2020-10-03 DIAGNOSIS — M47812 Spondylosis without myelopathy or radiculopathy, cervical region: Secondary | ICD-10-CM | POA: Diagnosis not present

## 2020-10-13 ENCOUNTER — Ambulatory Visit (INDEPENDENT_AMBULATORY_CARE_PROVIDER_SITE_OTHER): Payer: Medicare Other

## 2020-10-13 DIAGNOSIS — J309 Allergic rhinitis, unspecified: Secondary | ICD-10-CM | POA: Diagnosis not present

## 2020-10-24 DIAGNOSIS — H353221 Exudative age-related macular degeneration, left eye, with active choroidal neovascularization: Secondary | ICD-10-CM | POA: Diagnosis not present

## 2020-10-24 DIAGNOSIS — H353213 Exudative age-related macular degeneration, right eye, with inactive scar: Secondary | ICD-10-CM | POA: Diagnosis not present

## 2020-10-27 ENCOUNTER — Other Ambulatory Visit: Payer: Self-pay

## 2020-10-27 ENCOUNTER — Encounter (HOSPITAL_COMMUNITY)
Admission: RE | Admit: 2020-10-27 | Discharge: 2020-10-27 | Disposition: A | Discharge status: Home / Self Care | Payer: Medicare Other | Source: Ambulatory Visit | Attending: Family Medicine | Admitting: Family Medicine

## 2020-10-27 ENCOUNTER — Encounter (HOSPITAL_COMMUNITY): Payer: Self-pay

## 2020-10-27 DIAGNOSIS — Z452 Encounter for adjustment and management of vascular access device: Secondary | ICD-10-CM | POA: Diagnosis not present

## 2020-10-27 MED ORDER — HEPARIN SOD (PORK) LOCK FLUSH 100 UNIT/ML IV SOLN
500.0000 [IU] | Freq: Once | INTRAVENOUS | Status: AC
  Administered 2020-10-27: 500 [IU] via INTRAVENOUS

## 2020-10-31 DIAGNOSIS — E1151 Type 2 diabetes mellitus with diabetic peripheral angiopathy without gangrene: Secondary | ICD-10-CM | POA: Diagnosis not present

## 2020-10-31 DIAGNOSIS — E1140 Type 2 diabetes mellitus with diabetic neuropathy, unspecified: Secondary | ICD-10-CM | POA: Diagnosis not present

## 2020-11-01 DIAGNOSIS — E118 Type 2 diabetes mellitus with unspecified complications: Secondary | ICD-10-CM | POA: Diagnosis not present

## 2020-11-01 DIAGNOSIS — K828 Other specified diseases of gallbladder: Secondary | ICD-10-CM | POA: Diagnosis not present

## 2020-11-01 DIAGNOSIS — R1011 Right upper quadrant pain: Secondary | ICD-10-CM | POA: Diagnosis not present

## 2020-11-01 DIAGNOSIS — Z6836 Body mass index (BMI) 36.0-36.9, adult: Secondary | ICD-10-CM | POA: Diagnosis not present

## 2020-11-01 DIAGNOSIS — Z1389 Encounter for screening for other disorder: Secondary | ICD-10-CM | POA: Diagnosis not present

## 2020-11-01 DIAGNOSIS — I2510 Atherosclerotic heart disease of native coronary artery without angina pectoris: Secondary | ICD-10-CM | POA: Diagnosis not present

## 2020-11-01 DIAGNOSIS — E6609 Other obesity due to excess calories: Secondary | ICD-10-CM | POA: Diagnosis not present

## 2020-11-06 ENCOUNTER — Other Ambulatory Visit: Payer: Self-pay | Admitting: Family Medicine

## 2020-11-06 ENCOUNTER — Other Ambulatory Visit (HOSPITAL_COMMUNITY): Payer: Self-pay | Admitting: Family Medicine

## 2020-11-06 DIAGNOSIS — R1011 Right upper quadrant pain: Secondary | ICD-10-CM

## 2020-11-07 ENCOUNTER — Other Ambulatory Visit (HOSPITAL_COMMUNITY): Payer: Self-pay | Admitting: Family Medicine

## 2020-11-07 DIAGNOSIS — K828 Other specified diseases of gallbladder: Secondary | ICD-10-CM

## 2020-11-10 ENCOUNTER — Ambulatory Visit (HOSPITAL_COMMUNITY)
Admission: RE | Admit: 2020-11-10 | Discharge: 2020-11-10 | Disposition: A | Discharge status: Home / Self Care | Payer: Medicare Other | Source: Ambulatory Visit | Attending: Family Medicine | Admitting: Family Medicine

## 2020-11-10 DIAGNOSIS — K7689 Other specified diseases of liver: Secondary | ICD-10-CM | POA: Diagnosis not present

## 2020-11-10 DIAGNOSIS — R1011 Right upper quadrant pain: Secondary | ICD-10-CM | POA: Diagnosis not present

## 2020-11-15 ENCOUNTER — Other Ambulatory Visit: Payer: Self-pay

## 2020-11-15 ENCOUNTER — Encounter (HOSPITAL_COMMUNITY)
Admission: RE | Admit: 2020-11-15 | Discharge: 2020-11-15 | Disposition: A | Discharge status: Home / Self Care | Payer: Medicare Other | Source: Ambulatory Visit | Attending: Family Medicine | Admitting: Family Medicine

## 2020-11-15 ENCOUNTER — Other Ambulatory Visit (HOSPITAL_COMMUNITY): Payer: Medicare Other

## 2020-11-15 ENCOUNTER — Encounter (HOSPITAL_COMMUNITY): Payer: Self-pay

## 2020-11-15 DIAGNOSIS — K828 Other specified diseases of gallbladder: Secondary | ICD-10-CM | POA: Insufficient documentation

## 2020-11-15 DIAGNOSIS — R109 Unspecified abdominal pain: Secondary | ICD-10-CM | POA: Diagnosis not present

## 2020-11-15 MED ORDER — TECHNETIUM TC 99M MEBROFENIN IV KIT
5.0000 | PACK | Freq: Once | INTRAVENOUS | Status: AC | PRN
  Administered 2020-11-15: 5.5 via INTRAVENOUS

## 2020-11-22 ENCOUNTER — Other Ambulatory Visit (HOSPITAL_COMMUNITY): Payer: Medicare Other

## 2020-11-24 DIAGNOSIS — J40 Bronchitis, not specified as acute or chronic: Secondary | ICD-10-CM | POA: Diagnosis not present

## 2020-11-24 DIAGNOSIS — J329 Chronic sinusitis, unspecified: Secondary | ICD-10-CM | POA: Diagnosis not present

## 2020-11-27 ENCOUNTER — Other Ambulatory Visit (HOSPITAL_COMMUNITY): Payer: Self-pay | Admitting: Internal Medicine

## 2020-11-27 ENCOUNTER — Ambulatory Visit (HOSPITAL_COMMUNITY)
Admission: RE | Admit: 2020-11-27 | Discharge: 2020-11-27 | Disposition: A | Discharge status: Home / Self Care | Payer: Medicare Other | Source: Ambulatory Visit | Attending: Internal Medicine | Admitting: Internal Medicine

## 2020-11-27 DIAGNOSIS — R0602 Shortness of breath: Secondary | ICD-10-CM | POA: Diagnosis not present

## 2020-11-27 DIAGNOSIS — Z1231 Encounter for screening mammogram for malignant neoplasm of breast: Secondary | ICD-10-CM | POA: Diagnosis not present

## 2020-11-27 DIAGNOSIS — R059 Cough, unspecified: Secondary | ICD-10-CM | POA: Diagnosis not present

## 2020-11-27 DIAGNOSIS — Z6836 Body mass index (BMI) 36.0-36.9, adult: Secondary | ICD-10-CM | POA: Diagnosis not present

## 2020-11-27 DIAGNOSIS — J9811 Atelectasis: Secondary | ICD-10-CM | POA: Diagnosis not present

## 2020-11-27 DIAGNOSIS — R509 Fever, unspecified: Secondary | ICD-10-CM | POA: Diagnosis not present

## 2020-11-27 DIAGNOSIS — U099 Post covid-19 condition, unspecified: Secondary | ICD-10-CM | POA: Diagnosis not present

## 2020-12-05 DIAGNOSIS — H353213 Exudative age-related macular degeneration, right eye, with inactive scar: Secondary | ICD-10-CM | POA: Diagnosis not present

## 2020-12-05 DIAGNOSIS — H353221 Exudative age-related macular degeneration, left eye, with active choroidal neovascularization: Secondary | ICD-10-CM | POA: Diagnosis not present

## 2020-12-15 ENCOUNTER — Ambulatory Visit: Payer: Self-pay

## 2020-12-29 ENCOUNTER — Telehealth: Payer: Self-pay | Admitting: Cardiology

## 2020-12-29 ENCOUNTER — Other Ambulatory Visit: Payer: Self-pay | Admitting: Cardiology

## 2020-12-29 ENCOUNTER — Other Ambulatory Visit: Payer: Self-pay

## 2020-12-29 MED ORDER — SIMVASTATIN 10 MG PO TABS: ORAL_TABLET | ORAL | 1 refills | Status: DC

## 2020-12-29 NOTE — Telephone Encounter (Signed)
*  STAT* If patient is at the pharmacy, call can be transferred to refill team.   1. Which medications need to be refilled? (please list name of each medication and dose if known)   simvastatin (ZOCOR) 10 MG tablet     2. Which pharmacy/location (including street and city if local pharmacy) is medication to be sent to? [id number] - [place], [address]  3. Do they need a 30 day or 90 day supply? 90 day supply   PT is all out of this medication.He has a scheduled appt on 10/24.PT soes not have medication to get him to his appt

## 2020-12-29 NOTE — Telephone Encounter (Signed)
simvastatin (ZOCOR) 10 MG tablet 90 tablet 1 12/29/2020    Sig: TAKE 1 TABLET(10 MG) BY MOUTH DAILY AT 6 PM   Sent to pharmacy as: simvastatin (ZOCOR) 10 MG tablet   E-Prescribing Status: Receipt confirmed by pharmacy (12/29/2020 11:17 AM EDT)     Pharmacy  [place], [place]. [address]

## 2021-01-02 DIAGNOSIS — N401 Enlarged prostate with lower urinary tract symptoms: Secondary | ICD-10-CM | POA: Diagnosis not present

## 2021-01-02 DIAGNOSIS — R350 Frequency of micturition: Secondary | ICD-10-CM | POA: Diagnosis not present

## 2021-01-02 DIAGNOSIS — R972 Elevated prostate specific antigen [PSA]: Secondary | ICD-10-CM | POA: Diagnosis not present

## 2021-01-02 DIAGNOSIS — R3912 Poor urinary stream: Secondary | ICD-10-CM | POA: Diagnosis not present

## 2021-01-08 DIAGNOSIS — M542 Cervicalgia: Secondary | ICD-10-CM | POA: Diagnosis not present

## 2021-01-08 DIAGNOSIS — M545 Low back pain, unspecified: Secondary | ICD-10-CM | POA: Diagnosis not present

## 2021-01-15 DIAGNOSIS — M25579 Pain in unspecified ankle and joints of unspecified foot: Secondary | ICD-10-CM | POA: Diagnosis not present

## 2021-01-15 DIAGNOSIS — M79672 Pain in left foot: Secondary | ICD-10-CM | POA: Diagnosis not present

## 2021-01-15 DIAGNOSIS — M1990 Unspecified osteoarthritis, unspecified site: Secondary | ICD-10-CM | POA: Diagnosis not present

## 2021-01-16 DIAGNOSIS — H353213 Exudative age-related macular degeneration, right eye, with inactive scar: Secondary | ICD-10-CM | POA: Diagnosis not present

## 2021-01-16 DIAGNOSIS — H353221 Exudative age-related macular degeneration, left eye, with active choroidal neovascularization: Secondary | ICD-10-CM | POA: Diagnosis not present

## 2021-01-23 ENCOUNTER — Telehealth: Payer: Self-pay | Admitting: Cardiology

## 2021-01-23 DIAGNOSIS — E1140 Type 2 diabetes mellitus with diabetic neuropathy, unspecified: Secondary | ICD-10-CM | POA: Diagnosis not present

## 2021-01-23 DIAGNOSIS — M7989 Other specified soft tissue disorders: Secondary | ICD-10-CM

## 2021-01-23 DIAGNOSIS — E1151 Type 2 diabetes mellitus with diabetic peripheral angiopathy without gangrene: Secondary | ICD-10-CM | POA: Diagnosis not present

## 2021-01-23 DIAGNOSIS — R0609 Other forms of dyspnea: Secondary | ICD-10-CM

## 2021-01-23 DIAGNOSIS — R0600 Dyspnea, unspecified: Secondary | ICD-10-CM

## 2021-01-23 NOTE — Telephone Encounter (Signed)
Pt c/o swelling: STAT is pt has developed SOB within 24 hours  If swelling, where is the swelling located? Hands and Legs  How much weight have you gained and in what time span? NO  Have you gained 3 pounds in a day or 5 pounds in a week? No weight gain  Do you have a log of your daily weights (if so, list)? Unknown  Are you currently taking a fluid pill? Yes  Are you currently SOB? No  Have you traveled recently? NO   Pt IS REQUESTING a callback to figure out his medication that is suppose to prevent the swelling

## 2021-01-23 NOTE — Telephone Encounter (Signed)
Returned the call to the patient. He stated that for the last 6 weeks that he has been having worsening swelling in his feet and hands bilateral. He stated that he watches his sodium intake and elevates his legs when he can. He does not wear compression stockings. He denies shortness of breath and denies weight gain.  He currently takes Hydrochlorothiazide 25 mg once daily.  He stated he is concerned at this point because the swelling has moved to his hands. The swelling does get better overnight.

## 2021-01-25 NOTE — Telephone Encounter (Signed)
Spoke with patient. Instruction given per order,Dr Ellyn Hack-- labwork prior to virtual visit  with Dr Ellyn Hack.  Appointment schedule for July 8,2022 at 8:40 am virtual . Patient verbalized understanding. Patient states he will go to the lab in [place] prior to appointment.

## 2021-01-26 DIAGNOSIS — H353213 Exudative age-related macular degeneration, right eye, with inactive scar: Secondary | ICD-10-CM | POA: Diagnosis not present

## 2021-01-26 DIAGNOSIS — H353221 Exudative age-related macular degeneration, left eye, with active choroidal neovascularization: Secondary | ICD-10-CM | POA: Diagnosis not present

## 2021-01-26 DIAGNOSIS — H35363 Drusen (degenerative) of macula, bilateral: Secondary | ICD-10-CM | POA: Diagnosis not present

## 2021-01-26 DIAGNOSIS — H35453 Secondary pigmentary degeneration, bilateral: Secondary | ICD-10-CM | POA: Diagnosis not present

## 2021-01-30 DIAGNOSIS — M7989 Other specified soft tissue disorders: Secondary | ICD-10-CM | POA: Diagnosis not present

## 2021-01-30 DIAGNOSIS — R0600 Dyspnea, unspecified: Secondary | ICD-10-CM | POA: Diagnosis not present

## 2021-01-31 LAB — COMPREHENSIVE METABOLIC PANEL
ALT: 20 IU/L (ref 0–44)
AST: 17 IU/L (ref 0–40)
Albumin/Globulin Ratio: 2.5 — ABNORMAL HIGH (ref 1.2–2.2)
Albumin: 4.5 g/dL (ref 3.7–4.7)
Alkaline Phosphatase: 87 IU/L (ref 44–121)
BUN/Creatinine Ratio: 22 (ref 10–24)
BUN: 19 mg/dL (ref 8–27)
Bilirubin Total: 0.7 mg/dL (ref 0.0–1.2)
CO2: 28 mmol/L (ref 20–29)
Calcium: 9.4 mg/dL (ref 8.6–10.2)
Chloride: 99 mmol/L (ref 96–106)
Creatinine, Ser: 0.87 mg/dL (ref 0.76–1.27)
Globulin, Total: 1.8 g/dL (ref 1.5–4.5)
Glucose: 157 mg/dL — ABNORMAL HIGH (ref 65–99)
Potassium: 4.1 mmol/L (ref 3.5–5.2)
Sodium: 141 mmol/L (ref 134–144)
Total Protein: 6.3 g/dL (ref 6.0–8.5)
eGFR: 89 mL/min/{1.73_m2} (ref >59)

## 2021-01-31 LAB — BRAIN NATRIURETIC PEPTIDE: BNP: 17.9 pg/mL (ref 0.0–100.0)

## 2021-02-01 ENCOUNTER — Telehealth: Payer: Self-pay | Admitting: Cardiology

## 2021-02-01 NOTE — Telephone Encounter (Signed)
Spoke to patient. Patient wanted know if his appointment tomorrow is virtual or in person. Rn informed patient - appointment is virtual -  RN went over direction concerning how to proceed with virtual visit. Patient verbalized understanding.

## 2021-02-01 NOTE — Telephone Encounter (Signed)
Pt c/o BP issue: STAT if pt c/o blurred vision, one-sided weakness or slurred speech  1. What are your last 5 BP readings? 165/97 yesterday, pt have not had BP taken today. Daughter does not have any other BP readings  2. Are you having any other symptoms (ex. Dizziness, headache, blurred vision, passed out)? Fatigue,irregular heartbeat,blurred vision but pt is almost blind per daughter  3. What is your BP issue? Daughter checked his BP and it said heart arrhythmia   Daughter is not with pt at this current time but patient can be contacted directly.  Pt has an appt with Dr. Ellyn Hack tomorrow  Daughter is going to reach out to her mother and ask her to please take patients BP since it hasn't been taken today   Daughter is not at home with the patient now so I did not speak directly with the patient

## 2021-02-02 ENCOUNTER — Telehealth: Payer: Self-pay | Admitting: *Deleted

## 2021-02-02 ENCOUNTER — Encounter: Payer: Self-pay | Admitting: Cardiology

## 2021-02-02 ENCOUNTER — Telehealth (INDEPENDENT_AMBULATORY_CARE_PROVIDER_SITE_OTHER): Payer: Medicare Other | Admitting: Cardiology

## 2021-02-02 ENCOUNTER — Telehealth: Payer: Self-pay | Admitting: Cardiology

## 2021-02-02 DIAGNOSIS — T466X5A Adverse effect of antihyperlipidemic and antiarteriosclerotic drugs, initial encounter: Secondary | ICD-10-CM

## 2021-02-02 DIAGNOSIS — I878 Other specified disorders of veins: Secondary | ICD-10-CM | POA: Diagnosis not present

## 2021-02-02 DIAGNOSIS — I10 Essential (primary) hypertension: Secondary | ICD-10-CM | POA: Diagnosis not present

## 2021-02-02 DIAGNOSIS — E1169 Type 2 diabetes mellitus with other specified complication: Secondary | ICD-10-CM | POA: Diagnosis not present

## 2021-02-02 DIAGNOSIS — R001 Bradycardia, unspecified: Secondary | ICD-10-CM

## 2021-02-02 DIAGNOSIS — E785 Hyperlipidemia, unspecified: Secondary | ICD-10-CM

## 2021-02-02 DIAGNOSIS — I2510 Atherosclerotic heart disease of native coronary artery without angina pectoris: Secondary | ICD-10-CM

## 2021-02-02 DIAGNOSIS — G720 Drug-induced myopathy: Secondary | ICD-10-CM

## 2021-02-02 DIAGNOSIS — Z9861 Coronary angioplasty status: Secondary | ICD-10-CM

## 2021-02-02 MED ORDER — FUROSEMIDE 20 MG PO TABS
20.0000 mg | ORAL_TABLET | ORAL | 3 refills | Status: DC | PRN
Start: 2021-02-02 — End: 2021-07-31

## 2021-02-02 MED ORDER — CARVEDILOL 3.125 MG PO TABS: 3.1250 mg | ORAL_TABLET | Freq: Two times a day (BID) | ORAL | 3 refills | Status: DC

## 2021-02-02 NOTE — Assessment & Plan Note (Signed)
Thankfully, he does not seem to be bradycardic now is able to get his heart rate up into the 80s and 90s.  He has not had chronotropic incompetence symptoms and with his blood pressure being uncontrollable, I Minna start back on carvedilol 3.125 mg daily.

## 2021-02-02 NOTE — Telephone Encounter (Signed)
RN spoke to patient. Instruction were given  from today's virtual visit 02/02/21 .  AVS SUMMARY has been sent by mail. a   Patient verbalized understanding

## 2021-02-02 NOTE — Assessment & Plan Note (Addendum)
Unfortunately, I do not have labs from last checked by PCP.  They have been not quite at goal.  Plan is to get his labs from PCP office to get the new assessment.  I will have him follow-up with [facility] clinical pharmacist for blood pressure and lipid evaluation in the next couple months.  He is only on low-dose metformin for diabetes.  Will defer to PCP.  With him having some swelling, would consider SGLT2 inhibitor

## 2021-02-02 NOTE — Assessment & Plan Note (Addendum)
Blood pressure has been running quite high.  He has had some readings as high as 160/90.  Somewhat resistant hypertension at this point.  He is on HCTZ, lisinopril 40 and amlodipine 10.  He will clearly require additional medication.  We discussed dietary concerns with a salty 6 diet sheet.  I am providing prescription for Lasix to take as needed for edema.  I do not want him to take both HCTZ and Lasix. (But potentially consider converting from HCTZ to chlorthalidone, but if heart rate will let us tolerate beta-blocker at that much would probably also need to consider hydralazine)  Plan:: Add carvedilol 3.25 mg twice daily. [facility] Clinical Pharmacist [facility] follow-up to titrate medications.  Hopefully heart rate will tolerate beta-blocker.

## 2021-02-02 NOTE — Assessment & Plan Note (Addendum)
This will be the second or third time I have try to get him to get into see [facility].  Now that we have blood pressure and lipids to manage, I am going to refer back to [facility].  We need to get the results from his PCP if not we will need to get labs checked at our office.

## 2021-02-02 NOTE — Telephone Encounter (Signed)
Spoke with Janett Billow, aware furosemide is once daily as needed.

## 2021-02-02 NOTE — Progress Notes (Signed)
Virtual Visit via Telephone Note   This visit type was conducted due to national recommendations for restrictions regarding the COVID-19 Pandemic (e.g. social distancing) in an effort to limit this patient's exposure and mitigate transmission in our community.  Due to his co-morbid illnesses, this patient is at least at moderate risk for complications without adequate follow up.  This format is felt to be most appropriate for this patient at this time.  The patient did not have access to video technology/had technical difficulties with video requiring transitioning to audio format only (telephone).  All issues noted in this document were discussed and addressed.  No physical exam could be performed with this format.  Please refer to the patient's chart for his  consent to telehealth for [facility].   Patient has given verbal permission to conduct this visit via virtual appointment and to bill insurance 02/02/2021 4:38 PM     Evaluation Performed:  Follow-up visit  Date:  02/02/2021   ID:  Arthur Lutz, Arthur Lutz 1940-10-10, MRN [id number]  Patient Location: Home Provider Location: Home Office  PCP:  Redmond School, MD  Cardiologist:  Glenetta Hew, MD  Electrophysiologist:  None   Chief Complaint:   Chief Complaint  Patient presents with   Foot Swelling    Foot and hand swelling    ====================================  ASSESSMENT & PLAN:    Problem List Items Addressed This Visit     CAD S/P percutaneous coronary angioplasty: 08/07/11 cutting balloon PTCA to 95% ostial diag. and 90%  proximal LCX.  prior cutting balloon to RCA  & ostium of ostial diag., 2009  (Chronic)    Stable with no anginal symptoms.  Nexletol intolerance related to deconditioning and arthritis.  Plan: Continue amlodipine and amlodipine and will add carvedilol 3.125 mg twice daily. Continue lisinopril Continue simvastatin and Zetia (has been intolerant of rosuvastatin and atorvastatin).  Although may need more  aggressive management. I recommended 81 mg aspirin and no longer on Plavix because of his history of macular bleeding.       Relevant Medications   carvedilol (COREG) 3.125 MG tablet   furosemide (LASIX) 20 MG tablet   Essential hypertension (Chronic)    Blood pressure has been running quite high.  He has had some readings as high as 160/90.  Somewhat resistant hypertension at this point.  He is on HCTZ, lisinopril 40 and amlodipine 10.  He will clearly require additional medication.   I am providing prescription for Lasix to take as needed for edema.  I do not want him to take both HCTZ and Lasix. (But potentially consider converting from HCTZ to chlorthalidone, but if heart rate will let us tolerate beta-blocker at that much would probably also need to consider hydralazine)  Plan:: Add carvedilol 3.25 mg twice daily. [facility] Clinical Pharmacist [facility] Clinic follow-up to titrate medications.  Hopefully heart rate will tolerate beta-blocker.       Relevant Medications   carvedilol (COREG) 3.125 MG tablet   furosemide (LASIX) 20 MG tablet   Hyperlipidemia associated with type 2 diabetes mellitus (HCC) (Chronic)    Unfortunately, I do not have labs from last checked by PCP.  They have been not quite at goal.  Plan is to get his labs from PCP office to get the new assessment.  I will have him follow-up with [facility] clinical pharmacist for blood pressure and lipid evaluation in the next couple months.  He is only on low-dose metformin for diabetes.  Will defer to PCP.  With him having some swelling, would consider SGLT2 inhibitor       Statin myopathy (Chronic)    This will be the second or third time I have try to get him to get into see [facility].  Now that we have blood pressure and lipids to manage, I am going to refer back to [facility].  We need to get the results from his PCP if not we will need to get labs checked at our office.        Venous  stasis of both lower extremities (Chronic)    He has had history of venous stasis swelling.  I think this is probably the main thing this going on.  Usually controlled with HCTZ, but probably made worse with pulling in the summertime with heat.  He does not have PND or orthopnea and therefore I do not think that edema is related to CHF.  In addition to venous stasis, I suspect that this is being exacerbated somewhat by amlodipine and lisinopril.  Unfortunately with his blood pressure being elevated were somewhat limited and will need to continue.  Stressed importance of foot elevation, but since he is noticing a little bit of worsening edema we will provide as needed Lasix that he will take in lieu of HCTZ.  Plan: Lasix 20 mg daily as needed       Relevant Medications   carvedilol (COREG) 3.125 MG tablet   furosemide (LASIX) 20 MG tablet   Sinus bradycardia (Chronic)    Thankfully, he does not seem to be bradycardic now is able to get his heart rate up into the 80s and 90s.  He has not had chronotropic incompetence symptoms and with his blood pressure being uncontrollable, I Minna start back on carvedilol 3.125 mg daily.       Relevant Medications   carvedilol (COREG) 3.125 MG tablet   furosemide (LASIX) 20 MG tablet    ====================================  History of Present Illness:    Arthur Lutz is a 80 y.o. male with PMH notable for CAD-PCI(NSTEMI) who presents via audio/video conferencing for a telehealth visit today as a delayed 6 month f/u.  Arthur Lutz is a former patient of Dr. Sharlynn Oliphant dates back to 2009 2009: Non-STEMI -> PCI RCA and PTCA of ostial D1. January 2013: PCI of LCx and Cutting Balloon PTCA of the D1. August 2013-all areas patent. February 2014 -> preop Myoview was nonischemic.  Arthur Lutz was last seen 06/01/2020   He indicates he is activity level is limited by his psoriatic arthritis.  He is not really able to this.  He does try to walk some, but  rarely does any true exercise. He never did wearing the monitor that has been ordered.  He notes mild end of day swelling he also has occasional nighttime cramping.   He otherwise has been relatively stable cardiac standpoint with no significant angina or heart failure symptoms.   CV Review of Symptoms (Summary) -: positive for - Mostly because of deconditioning and arthritis pains he does get some exertional dyspnea.  Ankle swelling seems to be pretty stable since starting HCTZ. No change  Hospitalizations:  None  He is currently still undergoing Immuno therapy for Allergic Rhinitis   Recent - Interim CV studies:   The following studies were reviewed today: None recently:  [place] is being evaluated today via telemedicine for evaluation of hand and foot swelling as well as worsening hypertension.  Called in about leg swelling (Bilateral Feet & Hands): for the last 6 weeks that he has been having worsening swelling in his feet and hands bilateral. He stated that he watches his sodium intake and elevates his legs when he can. He does not wear compression stockings. He denies shortness of breath and denies weight gain.   He currently takes Hydrochlorothiazide 25 mg once daily.   He stated he is concerned at this point because the swelling has moved to his hands. The swelling does get better overnight.  He said the swelling was actually worse last week and it is now.  He apparently was wearing tighter socks and it made it look like he is more swollen. He does note that a lot of the symptoms started began after he recovered from COVID 6 weeks ago.  He had a nagging cough that lasted first several weeks after COVID.  His wife thought it may be related to one of the medicines so they held his amlodipine for couple days, he noted the cough got better so they restarted amlodipine and the cough does not come back.    He says that although he has ankle and foot swelling as  well as hand swelling he has not had any PND orthopnea.  He says his blood pressures going up a little bit but not dramatically.  He does not have headaches related to the hypertension, but he does say that his blood pressure goes up when he has a sinus headache.  Otherwise in general he is relatively asymptomatic from a cardiac standpoint.  He has no heart failure or anginal symptoms.  As usual, he says his activity is limited by his arthritis.  He has psoriatic arthritis which really makes it hard for him to do much in the way of any significant activity.  He is quite deconditioned and will get short of breath if he overexerts.  (In reviewing his past note he did note some mild end of day swelling back in November 2021)  Cardiovascular ROS: no chest pain or dyspnea on exertion positive for - edema and somewhat labile blood pressures noted negative for - irregular heartbeat, orthopnea, palpitations, paroxysmal nocturnal dyspnea, rapid heart rate, shortness of breath, or lightheadedness/dizziness, syncope/near syncope or TIA/amaurosis fugax   ROS:  Please see the history of present illness.     Review of Systems  Constitutional:  Negative for malaise/fatigue (In general, he does not have a lot of energy) and weight loss.  HENT:  Negative for congestion and nosebleeds.   Respiratory:  Positive for cough (Dry hacking cough is now improved.). Negative for shortness of breath.        Just about 6 or 7 weeks out from [place]  Cardiovascular:        Per HPI  Gastrointestinal:  Negative for blood in stool and melena.  Genitourinary:  Negative for hematuria.  Musculoskeletal:  Positive for back pain and joint pain.       Rheumatoid arthritis related pain-hips knees and ankles  Neurological:  Positive for dizziness (Only if he stands up too quickly) and weakness (Legs are somewhat weak because of arthritis.). Negative for focal weakness.  Psychiatric/Behavioral:  Negative for depression and memory  loss. The patient has insomnia. The patient is not nervous/anxious.    Past Medical History:  Diagnosis Date   Allergic rhinitis    Aortic valve sclerosis    Cause of murmur   Arthritis of knee, left, needs total Knee in future with  Dr. Wynelle Link  08/07/2011   Bladder cancer ([place]) 11/2005   CAD S/P percutaneous coronary angioplasty 08/07/2011   NEW 08/07/11 cutting balloon PTCA to 95% ostial diag. and 90%  proximal LCX.  prior cutting balloon and to RCA  & ostium of ostial diag; Last Cath 02/2012 - Patent Diag PTCA, RCA & Circ BMS stents. Normal EF & EDP; Myoview 2/14 no evidence of ischemia or infarction   Diabetes mellitus type 2, controlled, with complications ([place])    Diabetes mellitus without complication ([place])    Dyslipidemia    History of heart attack 11/2007   "mild; did not affect the heart muscle"; PCI to RCA, Cutting PTCA Diag ostium   Hypertension    Macular degeneration    Macular degeneration of right eye    Now getting shots.    Melanoma of skin ([place]) ~ 2005   "level 2; on my back"   Myocardial infarction [facility]) 2012   Osteoarthritis    Status post right knee arthroplasty, positive for staph infection. Pending right arthroplasty   Portacath in place 08/11/2012   Statin intolerance 08/07/2011   Stroke ([place]) 1980's   "had facial strokes; 2; about a year apart; never did find what caused them"   Past Surgical History:  Procedure Laterality Date   BIOPSY  05/28/2019   Procedure: BIOPSY;  Surgeon: Rogene Houston, MD;  Location: [facility] SU[facility];  Service: Endoscopy;;  cecum   CARDIAC CATHETERIZATION  August '13   Widely patent RCA and LCx stents. Also patent PTCA site to D1, ~40-50% D2. Normal EF, normal EDP   CATARACT EXTRACTION W/ INTRAOCULAR LENS IMPLANT  ~ 2010   right   CATARACT EXTRACTION W/PHACO Left 11/21/2016   Procedure: CATARACT EXTRACTION PHACO AND INTRAOCULAR LENS PLACEMENT (IOC) CDE - 11.70 ;  Surgeon: Tonny Branch, MD;  Location: [facility] ORS;  Service: Ophthalmology;   Laterality: Left;  left   cold cup removal bladder lesion  11/2005   "malignant"   COLONOSCOPY N/A 12/09/2014   Procedure: COLONOSCOPY;  Surgeon: Rogene Houston, MD;  Location: [facility];  Service: Endoscopy;  Laterality: N/A;  955 - moved to 8:30 - Ann to notify pt   COLONOSCOPY WITH PROPOFOL N/A 05/28/2019   Procedure: COLONOSCOPY WITH PROPOFOL;  Surgeon: Rogene Houston, MD;  Location: [facility];  Service: Endoscopy;  Laterality: N/A;   CORONARY ANGIOPLASTY WITH STENT PLACEMENT  11/2007   Mid RCA - Driver BMS 3.5 mm x 15 mm; 2.0 mm Cutting Balloon PTCA - ostial D1   CORONARY ANGIOPLASTY WITH STENT PLACEMENT  08/07/11   Abnormal TM Myoview - for exertional throat discomfort: Patent RCA stent, 90% circumflex -- Vision BMS 3.5 mm x 12 mm --> 4.2 mm. Ostial D2 95% -- 2.25 mm Cutting Balloon PTCA   DOPPLER ECHOCARDIOGRAPHY  May 2009   Normal EF, impaired relaxation, with sclerotic aortic valve. No stenosis.   INGUINAL HERNIA REPAIR  ~ 1970   left   KNEE ARTHROSCOPY  02/20/11   left   LEFT HEART CATHETERIZATION WITH CORONARY ANGIOGRAM N/A 08/07/2011   Procedure: LEFT HEART CATHETERIZATION WITH CORONARY ANGIOGRAM;  Surgeon: Leonie Man, MD;  Location[facility] CATH LAB;  Service: Cardiovascular;  Laterality: N/A;   LEFT HEART CATHETERIZATION WITH CORONARY ANGIOGRAM N/A 03/06/2012   Procedure: LEFT HEART CATHETERIZATION WITH CORONARY ANGIOGRAM;  Surgeon: Leonie Man, MD;  Location[facility] CATH LAB;  Service: Cardiovascular;  Laterality: N/A;   NM MYOVIEW LTD  Jan. 31, 2014  Low risk, EF 49%, improved from prior. Small apical and apical septal defect, fixed likely artifact no skin or infarction.   PARTIAL KNEE ARTHROPLASTY  06/2003   right   PERCUTANEOUS CORONARY STENT INTERVENTION (PCI-S)  08/07/2011   Procedure: PERCUTANEOUS CORONARY STENT INTERVENTION (PCI-S);  Surgeon: Leonie Man, MD;  Location: [facility] CATH LAB;  Service: Cardiovascular;;   POLYPECTOMY  05/28/2019   Procedure: POLYPECTOMY;   Surgeon: Rogene Houston, MD;  Location: [facility];  Service: Endoscopy;;  colon   TONSILLECTOMY     "as a child"   TOTAL KNEE ARTHROPLASTY  11/2003   right x2 and one on left- Total of 3.   TOTAL KNEE ARTHROPLASTY Left 09/28/2012   Procedure: TOTAL KNEE ARTHROPLASTY;  Surgeon: Gearlean Alf, MD;  Location: [facility] ORS;  Service: Orthopedics;  Laterality: Left;     Current Meds  Medication Sig   acetaminophen (TYLENOL) 650 MG CR tablet Take 650 mg by mouth every 8 (eight) hours as needed for pain.   amLODipine (NORVASC) 10 MG tablet TAKE 1 TABLET(10 MG) BY MOUTH DAILY   Ascorbic Acid (VITAMIN C) 1000 MG tablet Take 1,000 mg by mouth daily.   budesonide (PULMICORT) 0.5 MG/2ML nebulizer solution FLUSH 2 ML MIXED WITH NASAL SALINE INTO THE NASAL PASSAGES DAILY AS DIRECTED.   carvedilol (COREG) 3.125 MG tablet Take 1 tablet (3.125 mg total) by mouth 2 (two) times daily.   Cholecalciferol (VITAMIN D) 50 MCG (2000 UT) tablet Take 2,000 Units by mouth daily.   clobetasol (TEMOVATE) 0.05 % external solution Apply 1 application topically daily as needed (irritation).    Coenzyme Q10 (CO Q-10) 100 MG CAPS Take 300 capsules by mouth 3 (three) times daily. (Patient taking differently: Take 100 capsules by mouth daily.)   cyclobenzaprine (FLEXERIL) 10 MG tablet Take 1 tablet (10 mg total) by mouth 2 (two) times daily as needed for muscle spasms.   EPINEPHrine 0.3 mg/0.3 mL IJ SOAJ injection Inject 0.3 mg into the muscle as needed for anaphylaxis.    ezetimibe (ZETIA) 10 MG tablet TAKE 1 TABLET(10 MG) BY MOUTH DAILY   furosemide (LASIX) 20 MG tablet Take 1 tablet (20 mg total) by mouth as needed. for worsening swelling -- (take instead of HCTZ).   hydrochlorothiazide (HYDRODIURIL) 25 MG tablet TAKE 1 TABLET(25 MG) BY MOUTH DAILY   indomethacin (INDOCIN) 50 MG capsule Take 50 mg by mouth 2 (two) times daily as needed.   isosorbide mononitrate (IMDUR) 30 MG 24 hr tablet Take 1 tablet (30 mg total) by mouth  daily.   lisinopril (ZESTRIL) 40 MG tablet TAKE 1/2 TABLET BY MOUTH DAILY   loperamide (IMODIUM) 2 MG capsule Take 1 capsule (2 mg total) by mouth 4 (four) times daily as needed for diarrhea or loose stools.   magnesium gluconate (MAGONATE) 500 MG tablet Take 500 mg by mouth daily.   metFORMIN (GLUCOPHAGE) 500 MG tablet Take 500 mg by mouth daily with breakfast.    predniSONE (DELTASONE) 10 MG tablet Take 1 tablet (10 mg total) by mouth daily as needed (arthritis). 3,3, 2, 1 tabs   simvastatin (ZOCOR) 10 MG tablet TAKE 1 TABLET(10 MG) BY MOUTH DAILY AT 6 PM   tacrolimus (PROTOPIC) 0.1 % ointment Apply topically 2 (two) times daily.   triazolam (HALCION) 0.125 MG tablet Take 1.5 tablets (0.1875 mg total) by mouth at bedtime. Pt takes 1.5 tabs at bedtime (Patient taking differently: 0.125 mg. Take 2 tablets at bedtime by mouth)  Allergies:   Rosuvastatin calcium   Social History   Tobacco Use   Smoking status: Former    Packs/day: 2.00    Years: 28.00    Pack years: 56.00    Types: Cigarettes    Quit date: 07/29/1980    Years since quitting: 40.5   Smokeless tobacco: Never  Vaping Use   Vaping Use: Never used  Substance Use Topics   Alcohol use: Yes    Alcohol/week: 2.0 standard drinks    Types: 1 Glasses of wine, 1 Standard drinks or equivalent per week    Comment:  "wine or whiskey" occasionally   Drug use: No     Family Hx: The patient's family history includes Emphysema in his father; Lung cancer in his paternal uncle. There is no history of Colon cancer.   Labs/Other Tests and Data Reviewed:    EKG:  No ECG reviewed.  Recent Labs: 08/21/2020: Hemoglobin 14.9; TSH 1.760 01/30/2021: ALT 20; BNP 17.9; BUN 19; Creatinine, Ser 0.87; Potassium 4.1; Sodium 141   Recent Lipid Panel Lab Results  Component Value Date/Time   CHOL 150 03/20/2016 07:16 AM   TRIG 116 03/20/2016 07:16 AM   HDL 41 03/20/2016 07:16 AM   CHOLHDL 3.7 03/20/2016 07:16 AM   LDLCALC 86 03/20/2016  07:16 AM    Wt Readings from Last 3 Encounters:  02/02/21 251 lb (113.9 kg)  08/16/20 254 lb (115.2 kg)  06/01/20 254 lb 9.6 oz (115.5 kg)     Objective:    Vital Signs:  BP (!) 164/92   Pulse 64   Ht 6' (1.829 m)   Wt 251 lb (113.9 kg)   BMI 34.04 kg/m   Last PM 147/78, HR 85 Very pleasant elderly gentleman in no acute distress. A&O x 3.  Normal Mood & Affect Non-labored respirations  ==========================================  COVID-19 Education: The signs and symptoms of COVID-19 were discussed with the patient and how to seek care for testing (follow up with PCP or arrange E-visit).   The importance of social distancing was discussed today.  Time:   Today, I have spent 22 minutes with the patient with telehealth technology discussing the above problems.   An additional 11 minutes spent charting (reviewing prior notes, hospital records, studies, labs etc.) Total 33 minutes   Medication Adjustments/Labs and Tests Ordered: Current medicines are reviewed at length with the patient today.  Concerns regarding medicines are outlined above.   Patient Instructions  Medication Instructions:   New: Carvedilol 3.125 mg PO BID - Disp #180, 3 refills New: Furosemide 20 mg PO AS NEEDED for worsening swelling -- (take instead of HCTZ).  -Disp # 33, 3 refills  *If you need a refill on your cardiac medications before your next appointment, please call your pharmacy*   Lab Work: We need to get a copy of Blood Work from Dr. Nolon Rod office --   If you have labs (blood work) drawn today and your tests are completely normal, you will receive your results only by: [place] (if you have MyChart) OR A paper copy in the mail If you have any lab test that is abnormal or we need to change your treatment, we will call you to review the results.   Testing/Procedures: none   Follow-Up: At [facility], you and your health needs are our priority.  As part of our continuing  mission to provide you with exceptional heart care, we have created designated Provider Care Teams.  These Care Teams include your primary  Cardiologist (physician) and Advanced Practice Providers (APPs -  Physician Assistants and Nurse Practitioners) who all work together to provide you with the care you need, when you need it.  We recommend signing up for the patient portal called "MyChart".  Sign up information is provided on this After Visit Summary.  MyChart is used to connect with patients for Virtual Visits (Telemedicine).  Patients are able to view lab/test results, encounter notes, upcoming appointments, etc.  Non-urgent messages can be sent to your provider as well.   To learn more about what you can do with MyChart, go to [email or web address].    Your next appointment:   **4-6 weeks with [facility] Clinical Pharmacists to discuss BP & Cholesterol Levels   6 month(s) _ Keep appointment on 10 /24/22  The format for your next appointment:   In Person  Provider:   Glenetta Hew, MD   Other Instructions Watch your salt intake  *- Salty 6 Sheet*      Signed, Glenetta Hew, MD  02/02/2021 4:38 PM    [place]

## 2021-02-02 NOTE — Assessment & Plan Note (Signed)
Stable with no anginal symptoms.  Nexletol intolerance related to deconditioning and arthritis.  Plan: Continue amlodipine and amlodipine and will add carvedilol 3.125 mg twice daily.  Continue lisinopril  Continue simvastatin and Zetia (has been intolerant of rosuvastatin and atorvastatin).  Although may need more aggressive management.  I recommended 81 mg aspirin and no longer on Plavix because of his history of macular bleeding.

## 2021-02-02 NOTE — Telephone Encounter (Signed)
Pt c/o medication issue:  1. Name of Medication: furosemide (LASIX) 20 MG tablet  2. How are you currently taking this medication (dosage and times per day)?   3. Are you having a reaction (difficulty breathing--STAT)? no  4. What is your medication issue? Jessica from [facility] is stating that the medication dosage is not clear

## 2021-02-02 NOTE — Assessment & Plan Note (Addendum)
He has had history of venous stasis swelling.  I think this is probably the main thing this going on.  Usually controlled with HCTZ, but probably made worse with pulling in the summertime with heat.  He does not have PND or orthopnea and therefore I do not think that edema is related to CHF.  In addition to venous stasis, I suspect that this is being exacerbated somewhat by amlodipine and lisinopril.  Unfortunately with his blood pressure being elevated were somewhat limited and will need to continue.  Stressed importance of foot elevation, but since he is noticing a little bit of worsening edema we will provide as needed Lasix that he will take in lieu of HCTZ.  Plan: Lasix 20 mg daily as needed

## 2021-02-02 NOTE — Patient Instructions (Addendum)
Medication Instructions:   New: Carvedilol 3.125 mg PO BID - Disp #180, 3 refills New: Furosemide 20 mg PO AS NEEDED for worsening swelling -- (take instead of HCTZ).  -Disp # 48, 3 refills  *If you need a refill on your cardiac medications before your next appointment, please call your pharmacy*   Lab Work: We need to get a copy of Blood Work from Dr. Nolon Rod office --   If you have labs (blood work) drawn today and your tests are completely normal, you will receive your results only by: [facility] (if you have MyChart) OR A paper copy in the mail If you have any lab test that is abnormal or we need to change your treatment, we will call you to review the results.   Testing/Procedures: none   Follow-Up: At [facility], you and your health needs are our priority.  As part of our continuing mission to provide you with exceptional heart care, we have created designated Provider Care Teams.  These Care Teams include your primary Cardiologist (physician) and Advanced Practice Providers (APPs -  Physician Assistants and Nurse Practitioners) who all work together to provide you with the care you need, when you need it.  We recommend signing up for the patient portal called "MyChart".  Sign up information is provided on this After Visit Summary.  MyChart is used to connect with patients for Virtual Visits (Telemedicine).  Patients are able to view lab/test results, encounter notes, upcoming appointments, etc.  Non-urgent messages can be sent to your provider as well.   To learn more about what you can do with MyChart, go to [email or web address].    Your next appointment:   **4-6 weeks with [facility] Clinical Pharmacists to discuss BP & Cholesterol Levels   6 month(s) _ Keep appointment on 10 /24/22  The format for your next appointment:   In Person  Provider:   Glenetta Hew, MD   Other Instructions Watch your salt intake  *- Salty 6 Sheet*

## 2021-02-02 NOTE — Telephone Encounter (Signed)
    Patient Consent for Virtual Visit        Arthur Lutz has provided verbal consent on 02/02/2021 for a virtual visit (video or telephone).   CONSENT FOR VIRTUAL VISIT FOR:  Arthur Lutz  By participating in this virtual visit I agree to the following:  I hereby voluntarily request, consent and authorize [facility] and its employed or contracted physicians, physician assistants, nurse practitioners or other licensed health care professionals (the Practitioner), to provide me with telemedicine health care services (the "Services") as deemed necessary by the treating Practitioner. I acknowledge and consent to receive the Services by the Practitioner via telemedicine. I understand that the telemedicine visit will involve communicating with the Practitioner through live audiovisual communication technology and the disclosure of certain medical information by electronic transmission. I acknowledge that I have been given the opportunity to request an in-person assessment or other available alternative prior to the telemedicine visit and am voluntarily participating in the telemedicine visit.  I understand that I have the right to withhold or withdraw my consent to the use of telemedicine in the course of my care at any time, without affecting my right to future care or treatment, and that the Practitioner or I may terminate the telemedicine visit at any time. I understand that I have the right to inspect all information obtained and/or recorded in the course of the telemedicine visit and may receive copies of available information for a reasonable fee.  I understand that some of the potential risks of receiving the Services via telemedicine include:  Delay or interruption in medical evaluation due to technological equipment failure or disruption; Information transmitted may not be sufficient (e.g. poor resolution of images) to allow for appropriate medical decision making by the Practitioner;  and/or  In rare instances, security protocols could fail, causing a breach of personal health information.  Furthermore, I acknowledge that it is my responsibility to provide information about my medical history, conditions and care that is complete and accurate to the best of my ability. I acknowledge that Practitioner's advice, recommendations, and/or decision may be based on factors not within their control, such as incomplete or inaccurate data provided by me or distortions of diagnostic images or specimens that may result from electronic transmissions. I understand that the practice of medicine is not an exact science and that Practitioner makes no warranties or guarantees regarding treatment outcomes. I acknowledge that a copy of this consent can be made available to me via my patient portal ([facility]), or I can request a printed copy by calling the office of [facility].    I understand that my insurance will be billed for this visit.   I have read or had this consent read to me. I understand the contents of this consent, which adequately explains the benefits and risks of the Services being provided via telemedicine.  I have been provided ample opportunity to ask questions regarding this consent and the Services and have had my questions answered to my satisfaction. I give my informed consent for the services to be provided through the use of telemedicine in my medical care

## 2021-02-08 DIAGNOSIS — H353230 Exudative age-related macular degeneration, bilateral, stage unspecified: Secondary | ICD-10-CM | POA: Diagnosis not present

## 2021-02-13 DIAGNOSIS — H353221 Exudative age-related macular degeneration, left eye, with active choroidal neovascularization: Secondary | ICD-10-CM | POA: Diagnosis not present

## 2021-02-13 DIAGNOSIS — H35363 Drusen (degenerative) of macula, bilateral: Secondary | ICD-10-CM | POA: Diagnosis not present

## 2021-02-13 DIAGNOSIS — H26492 Other secondary cataract, left eye: Secondary | ICD-10-CM | POA: Diagnosis not present

## 2021-02-13 DIAGNOSIS — H353213 Exudative age-related macular degeneration, right eye, with inactive scar: Secondary | ICD-10-CM | POA: Diagnosis not present

## 2021-02-15 ENCOUNTER — Other Ambulatory Visit: Payer: Self-pay | Admitting: Cardiology

## 2021-02-20 DIAGNOSIS — H26492 Other secondary cataract, left eye: Secondary | ICD-10-CM | POA: Diagnosis not present

## 2021-02-20 DIAGNOSIS — E119 Type 2 diabetes mellitus without complications: Secondary | ICD-10-CM | POA: Diagnosis not present

## 2021-02-20 DIAGNOSIS — H3530 Unspecified macular degeneration: Secondary | ICD-10-CM | POA: Diagnosis not present

## 2021-02-20 DIAGNOSIS — Z961 Presence of intraocular lens: Secondary | ICD-10-CM | POA: Diagnosis not present

## 2021-03-01 ENCOUNTER — Encounter (HOSPITAL_COMMUNITY): Payer: Medicare Other

## 2021-03-01 DIAGNOSIS — H353221 Exudative age-related macular degeneration, left eye, with active choroidal neovascularization: Secondary | ICD-10-CM | POA: Diagnosis not present

## 2021-03-02 DIAGNOSIS — L409 Psoriasis, unspecified: Secondary | ICD-10-CM | POA: Diagnosis not present

## 2021-03-02 DIAGNOSIS — L821 Other seborrheic keratosis: Secondary | ICD-10-CM | POA: Diagnosis not present

## 2021-03-02 DIAGNOSIS — L249 Irritant contact dermatitis, unspecified cause: Secondary | ICD-10-CM | POA: Diagnosis not present

## 2021-03-02 DIAGNOSIS — L570 Actinic keratosis: Secondary | ICD-10-CM | POA: Diagnosis not present

## 2021-03-06 DIAGNOSIS — Z9849 Cataract extraction status, unspecified eye: Secondary | ICD-10-CM | POA: Diagnosis not present

## 2021-03-07 ENCOUNTER — Other Ambulatory Visit: Payer: Self-pay

## 2021-03-07 ENCOUNTER — Encounter (HOSPITAL_COMMUNITY): Payer: Self-pay

## 2021-03-07 ENCOUNTER — Encounter (HOSPITAL_COMMUNITY)
Admission: RE | Admit: 2021-03-07 | Discharge: 2021-03-07 | Disposition: A | Discharge status: Home / Self Care | Payer: Medicare Other | Source: Ambulatory Visit | Attending: Family Medicine | Admitting: Family Medicine

## 2021-03-07 DIAGNOSIS — Z452 Encounter for adjustment and management of vascular access device: Secondary | ICD-10-CM | POA: Diagnosis not present

## 2021-03-07 MED ORDER — HEPARIN SOD (PORK) LOCK FLUSH 100 UNIT/ML IV SOLN
500.0000 [IU] | INTRAVENOUS | Status: AC | PRN
  Administered 2021-03-07: 500 [IU]

## 2021-03-07 MED ORDER — HEPARIN SOD (PORK) LOCK FLUSH 100 UNIT/ML IV SOLN
INTRAVENOUS | Status: AC
  Filled 2021-03-07: qty 5

## 2021-03-07 MED ORDER — SODIUM CHLORIDE 0.9% FLUSH: 10.0000 mL | INTRAVENOUS | Status: DC | PRN

## 2021-03-10 NOTE — Progress Notes (Signed)
Lab results: 11/28/2020 Na+ 144, K+ 4.2, Cl- 103, HCO3-27, BUN 21, Cr 0.82, Glu 121, Ca2+ 9.2; AST 25, ALT 31, AlkP 73; T protein 6.2, albumin 4.3 CBC: W 5.5, H/H 13.7/40.7, Plt 216  11/01/2020 TC 155, TG *226, HDL 36, LDL 81; A1c 7.3, TSH 1.19.  --> Unfortunately, the labs are not quite where we would like to be.  LDL is 81 which is better than it was in 2017, but still not at goal.  Would like to be less than 70.  Would like to try converting Zetia to Nexlizet 180-10 mg.  (Need to confirm if insurance will cover it).  If so, would start Nexlizet 180-10 mg p.o. daily, dispense 90 tabs with 3 refills.    Could potentially have to do separate Zetia 10 mg and Nexletol 180 mg. Would need lab recheck in 3 months after starting.  Glenetta Hew, MD

## 2021-03-12 ENCOUNTER — Other Ambulatory Visit: Payer: Self-pay

## 2021-03-12 ENCOUNTER — Ambulatory Visit (INDEPENDENT_AMBULATORY_CARE_PROVIDER_SITE_OTHER): Payer: Medicare Other | Admitting: Pharmacist Clinician (PhC)/ Clinical Pharmacy Specialist

## 2021-03-12 VITALS — BP 132/70 | HR 53 | Resp 15 | Ht 72.0 in | Wt 260.2 lb

## 2021-03-12 DIAGNOSIS — Z024 Encounter for examination for driving license: Secondary | ICD-10-CM | POA: Diagnosis not present

## 2021-03-12 DIAGNOSIS — E785 Hyperlipidemia, unspecified: Secondary | ICD-10-CM

## 2021-03-12 DIAGNOSIS — R600 Localized edema: Secondary | ICD-10-CM

## 2021-03-12 DIAGNOSIS — E1169 Type 2 diabetes mellitus with other specified complication: Secondary | ICD-10-CM

## 2021-03-12 DIAGNOSIS — I2510 Atherosclerotic heart disease of native coronary artery without angina pectoris: Secondary | ICD-10-CM | POA: Diagnosis not present

## 2021-03-12 DIAGNOSIS — Z9861 Coronary angioplasty status: Secondary | ICD-10-CM

## 2021-03-12 DIAGNOSIS — I10 Essential (primary) hypertension: Secondary | ICD-10-CM | POA: Diagnosis not present

## 2021-03-12 MED ORDER — NEXLIZET 180-10 MG PO TABS: 1.0000 | ORAL_TABLET | Freq: Every day | ORAL | 0 refills | Status: DC

## 2021-03-12 MED ORDER — NEXLIZET 180-10 MG PO TABS: 1.0000 | ORAL_TABLET | Freq: Every day | ORAL | 3 refills | Status: DC

## 2021-03-12 NOTE — Assessment & Plan Note (Signed)
Patient with essential hypertension, currently well controlled.  He is tolerating carvedilol, and is aware to watch for pulse < 50, but no issues to date.  Will have him continue with lisinopril, amlodipine and carvedilol.  He is also aware that the anti-inflammatory medications may increase his pressure.  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  Will continue to monitor.

## 2021-03-12 NOTE — Assessment & Plan Note (Signed)
Patient with hyperlipidemia and ASCVD, with LDL at 81.  Because he is also diabetic, will shoot for goal of < 55.  Reviewed options for lowering LDL cholesterol, including PCSK-9 inhibitors, bempedoic acid and inclisiran.  Discussed mechanisms of action, dosing, side effects and potential decreases in LDL cholesterol.  Also reviewed cost information and potential options for patient assistance.  Answered all patient questions.  Based on this information, patient would prefer to start bempedoic acid.  Because he is already on ezetimibe, will have him start Nexlizet once daily.  We will try to get prior authorization thru his insurance and he was also given information on charitable foundations, even thought they are not currently accepting applications.  He can continue to watch for availability.  Also asked that he stop the simvastatin for now.  He did have myalgias with rosuvastatin, so I am not sure if he is tolerating simvastatin well, or if some of his arthritis pain is really due to the simvastatin.  It would be ideal if he felt some relief of the arthritis without the simvastatin, because we ideally need to get him off of regular NSAID use.

## 2021-03-12 NOTE — Progress Notes (Signed)
03/12/2021 REDGE HARTT 31-May-1941 [id number]   HPI:  Arthur Lutz is a 80 y.o. male patient of Dr Ellyn Hack, with a PMH below who presents today for hypertension and lipid clinic evaluation.  Dr. Ruby Cola saw him thru a video visit in early July, at which time his home BP was recorded at 164/92.  Carvedilol 3.125 mg was added, although he was concerned about tolerability, as patient already has history of sinus bradycardia.  He also discontinued hctz and had patient start furosemide 20 mg prn lower extremity edema.  Unsure if this is due to venous insufficiency or a side effect of the amlodipine, but with his pressure elevated, he did not want to risk stopping the amlodipine.  Most recent labs in May show his LDL to be at 81 on simvastatin 10 mg and ezetimibe 10 mg.  He was unable to tolerated rosuvastatin due to myalgias.    He is in the office today for follow up.  Notes that the edema in his legs is improved, although it is still present.  Takes the furosemide daily without problems.  In reviewing chart it is noted that he has indomethacin, meloxicam and prednisone taper all for arthritis.  He states only using the meloxicam rarely, the prednisone (x 3 days) about every 3-4 months for flares, and just started the indomethacin twice daily about a month ago.  Does not seem to be adversely affecting his BP today.  More concerned if the arthritis is a mixture of that and statin myalgia.  Would prefer that he not be taking anti-inflammatory medications on a daily basis due to his ASCVD.    Still some swelling in legs, improved with lasix daily Indomethacin - twice daily for arthritis, esp at night;  Pred taper about q3-4 months  Repatha 37/126  Past Medical History: ASCVD 2013 cutting balloon to 95% ostial diagonal and 90% prox LCx; 2009 cutting balloon to RCA and ostium of ostial diag  Venous stasis PRN furosemide; no PND or orthopnea  Sinus bradycardia Per chart HR runs 50-80  DM2 A1c 7.3  (4/22) - on metformin 500 mg qd, lisinopril     Blood Pressure Goal:  130/80  Current Medications:lisinopril 40 mg, amlodipine 10 mg, carvedilol 3.125 mg   Ezetimibe 10 mg qd, simvastatin 10 mg   Intolerances: rosuvastatin - myalgias  Labs:  5/22:  Na 144, K 4.2, Glu 121, BUN 21, SCr 0.82, AST 25, ALT 31   TC 151, TG 226, HDL 36, LDL 81   Wt Readings from Last 3 Encounters:  03/12/21 260 lb 3.2 oz (118 kg)  02/02/21 251 lb (113.9 kg)  08/16/20 254 lb (115.2 kg)   BP Readings from Last 3 Encounters:  03/12/21 132/70  03/07/21 (!) 153/65  02/02/21 (!) 164/92   Pulse Readings from Last 3 Encounters:  03/12/21 (!) 53  03/07/21 (!) 56  02/02/21 64    Current Outpatient Medications  Medication Sig Dispense Refill   acetaminophen (TYLENOL) 650 MG CR tablet Take 650 mg by mouth every 8 (eight) hours as needed for pain.     amLODipine (NORVASC) 10 MG tablet TAKE 1 TABLET(10 MG) BY MOUTH DAILY 90 tablet 1   Ascorbic Acid (VITAMIN C) 1000 MG tablet Take 1,000 mg by mouth daily.     azelastine (ASTELIN) 0.1 % nasal spray USE 2 SPRAYS IN EACH NOSTRIL TWICE DAILY (Patient taking differently: 2 (two) times daily as needed.) 90 mL 1   Bempedoic Acid-Ezetimibe (NEXLIZET) 180-10  MG TABS Take 1 tablet by mouth daily. 90 tablet 3   Bempedoic Acid-Ezetimibe (NEXLIZET) 180-10 MG TABS Take 1 tablet by mouth daily. 21 tablet 0   budesonide (PULMICORT) 0.5 MG/2ML nebulizer solution FLUSH 2 ML MIXED WITH NASAL SALINE INTO THE NASAL PASSAGES DAILY AS DIRECTED. 180 mL 3   carvedilol (COREG) 3.125 MG tablet Take 1 tablet (3.125 mg total) by mouth 2 (two) times daily. 180 tablet 3   cetirizine (ZYRTEC ALLERGY) 10 MG tablet Take 1 tablet (10 mg total) by mouth daily. (Patient taking differently: Take 10 mg by mouth as needed.) 30 tablet 3   Cholecalciferol (VITAMIN D) 50 MCG (2000 UT) tablet Take 2,000 Units by mouth daily.     clobetasol (TEMOVATE) 0.05 % external solution Apply 1 application topically  daily as needed (irritation).      Coenzyme Q10 (CO Q-10) 100 MG CAPS Take 300 capsules by mouth 3 (three) times daily. (Patient taking differently: Take 100 capsules by mouth daily.) 90 each 11   cyclobenzaprine (FLEXERIL) 10 MG tablet Take 1 tablet (10 mg total) by mouth 2 (two) times daily as needed for muscle spasms. 20 tablet 0   EPINEPHrine 0.3 mg/0.3 mL IJ SOAJ injection Inject 0.3 mg into the muscle as needed for anaphylaxis.      Fluticasone Propionate (XHANCE) 93 MCG/ACT EXHU Place 2 application into the nose 2 (two) times daily. (Patient taking differently: Place 2 application into the nose as needed.) 32 mL 6   furosemide (LASIX) 20 MG tablet Take 1 tablet (20 mg total) by mouth as needed. for worsening swelling -- (take instead of HCTZ). 45 tablet 3   indomethacin (INDOCIN) 50 MG capsule Take 50 mg by mouth 2 (two) times daily as needed.     isosorbide mononitrate (IMDUR) 30 MG 24 hr tablet TAKE 1 TABLET(30 MG) BY MOUTH DAILY 90 tablet 3   lisinopril (ZESTRIL) 40 MG tablet TAKE 1/2 TABLET BY MOUTH DAILY 90 tablet 3   loperamide (IMODIUM) 2 MG capsule Take 1 capsule (2 mg total) by mouth 4 (four) times daily as needed for diarrhea or loose stools. 12 capsule 0   LOTEMAX 0.5 % OINT Apply 1 application to eye as needed.     magnesium gluconate (MAGONATE) 500 MG tablet Take 500 mg by mouth daily.     meloxicam (MOBIC) 15 MG tablet Take 15 mg by mouth daily.     metFORMIN (GLUCOPHAGE) 500 MG tablet Take 500 mg by mouth daily with breakfast.      Multiple Vitamins-Minerals (PRESERVISION AREDS PO) Take 1 capsule by mouth daily.     prednisoLONE acetate (PRED FORTE) 1 % ophthalmic suspension Place 1 drop into both eyes 2 (two) times daily.     predniSONE (DELTASONE) 10 MG tablet Take 1 tablet (10 mg total) by mouth daily as needed (arthritis). 3,3, 2, 1 tabs 30 tablet 0   simvastatin (ZOCOR) 10 MG tablet TAKE 1 TABLET(10 MG) BY MOUTH DAILY AT 6 PM 90 tablet 1   tacrolimus (PROTOPIC) 0.1 %  ointment Apply topically 2 (two) times daily. 100 g 0   tobramycin (TOBREX) 0.3 % ophthalmic solution Place 1 drop into both eyes as needed.     triazolam (HALCION) 0.125 MG tablet Take 1.5 tablets (0.1875 mg total) by mouth at bedtime. Pt takes 1.5 tabs at bedtime (Patient taking differently: 0.125 mg. Take 2 tablets at bedtime by mouth) 30 tablet 5   No current facility-administered medications for this visit.    Allergies  Allergen Reactions   Rosuvastatin Calcium Other (See Comments)    Myalgias, tired crestor -aches    Past Medical History:  Diagnosis Date   Allergic rhinitis    Aortic valve sclerosis    Cause of murmur   Arthritis of knee, left, needs total Knee in future with Dr. Wynelle Link  08/07/2011   Bladder cancer ([facility]) 11/2005   CAD S/P percutaneous coronary angioplasty 08/07/2011   NEW 08/07/11 cutting balloon PTCA to 95% ostial diag. and 90%  proximal LCX.  prior cutting balloon and to RCA  & ostium of ostial diag; Last Cath 02/2012 - Patent Diag PTCA, RCA & Circ BMS stents. Normal EF & EDP; Myoview 2/14 no evidence of ischemia or infarction   Diabetes mellitus type 2, controlled, with complications ([place])    Diabetes mellitus without complication ([place])    Dyslipidemia    History of heart attack 11/2007   "mild; did not affect the heart muscle"; PCI to RCA, Cutting PTCA Diag ostium   Hypertension    Macular degeneration    Macular degeneration of right eye    Now getting shots.    Melanoma of skin ([place]) ~ 2005   "level 2; on my back"   Myocardial infarction [facility]) 2012   Osteoarthritis    Status post right knee arthroplasty, positive for staph infection. Pending right arthroplasty   Portacath in place 08/11/2012   Statin intolerance 08/07/2011   Stroke ([place]) 1980's   "had facial strokes; 2; about a year apart; never did find what caused them"    Blood pressure 132/70, pulse (!) 53, resp. rate 15, height 6' (1.829 m), weight 260 lb 3.2 oz (118 kg), SpO2 92 %.  Essential  hypertension Patient with essential hypertension, currently well controlled.  He is tolerating carvedilol, and is aware to watch for pulse < 50, but no issues to date.  Will have him continue with lisinopril, amlodipine and carvedilol.  He is also aware that the anti-inflammatory medications may increase his pressure.  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  Will continue to monitor.    Hyperlipidemia associated with type 2 diabetes mellitus ([place]) Patient with hyperlipidemia and ASCVD, with LDL at 81.  Because he is also diabetic, will shoot for goal of < 55.  Reviewed options for lowering LDL cholesterol, including PCSK-9 inhibitors, bempedoic acid and inclisiran.  Discussed mechanisms of action, dosing, side effects and potential decreases in LDL cholesterol.  Also reviewed cost information and potential options for patient assistance.  Answered all patient questions.  Based on this information, patient would prefer to start bempedoic acid.  Because he is already on ezetimibe, will have him start Nexlizet once daily.  We will try to get prior authorization thru his insurance and he was also given information on charitable foundations, even thought they are not currently accepting applications.  He can continue to watch for availability.  Also asked that he stop the simvastatin for now.  He did have myalgias with rosuvastatin, so I am not sure if he is tolerating simvastatin well, or if some of his arthritis pain is really due to the simvastatin.  It would be ideal if he felt some relief of the arthritis without the simvastatin, because we ideally need to get him off of regular NSAID use.     Tommy Medal PharmD CPP [facility] [address] [place], [place] [address] [phone]

## 2021-03-12 NOTE — Patient Instructions (Addendum)
Your Results:             Your most recent labs Goal  Total Cholesterol 151 < 200  Triglycerides 226 < 150  HDL (happy/good cholesterol) 36 > 40  LDL (lousy/bad cholesterol 81 < 55   Medication changes:  Stop simvastatin and ezetimibe.  Start Nexizet 180/10 mg once daily.  Continue with all other medications  Lab orders:  After 1 month on Nexlizet, please go to the lab and have metabolic panel and uric acid levels drawn.    After 2 months on Nexlizet, please go to the lab and have liver function and cholesterol levels drawn.    Patient Assistance:  The [employer] foundation offers assistance to help pay for medication copays.  They will cover copays for all cholesterol lowering meds, including statins, fibrates, omega-3 oils, ezetimibe, Repatha, Praluent, Nexletol, Nexlizet.  The cards are usually good for $2,500 or 12 months, whichever comes first. Go to healthwellfoundation.org Click on "Apply Now" Answer questions as to whom is applying (patient or representative) Your disease fund will be "hypercholesterolemia - Medicare access" They will ask questions about finances and which medications you are taking for cholesterol When you submit, the approval is usually within minutes.  You will need to print the card information from the site You will need to show this information to your pharmacy, they will bill your Medicare Part D plan first -then bill [employer] --for the copay.   You can also call them at [phone], although the hold times can be quite long.   You can also try [employer] for any assistance as well.   neHigh Triglycerides Eating Plan Triglycerides are a type of fat in the blood. High levels of triglycerides can increase your risk of heart disease and stroke. If your triglyceride levels are high, choosing the right foods can help lower your triglycerides and keep your heart healthy. Work with your health care provider or a diet and nutrition specialist (dietitian)  to develop an eating plan that is right for you. What are tips for following this plan? General guidelines  Lose weight, if you are overweight. For most people, losing 5-10 lbs (2-5 kg) helps lower triglyceride levels. A weight-loss plan may include. 30 minutes of exercise at least 5 days a week. Reducing the amount of calories, sugar, and fat you eat. Eat a wide variety of fresh fruits, vegetables, and whole grains. These foods are high in fiber. Eat foods that contain healthy fats, such as fatty fish, nuts, seeds, and olive oil. Avoid foods that are high in added sugar, added salt (sodium), saturated fat, and trans fat. Avoid low-fiber, refined carbohydrates such as white bread, crackers, noodles, and white rice. Avoid foods with partially hydrogenated oils (trans fats), such as fried foods or stick margarine. Limit alcohol intake to no more than 1 drink a day for nonpregnant women and 2 drinks a day for men. One drink equals 12 oz of beer, 5 oz of wine, or 1 oz of hard liquor. Your health care provider may recommend that you drink less depending on your overall health.  Reading food labels Check food labels for the amount of saturated fat. Choose foods with no or very little saturated fat. Check food labels for the amount of trans fat. Choose foods with no trans fat. Check food labels for the amount of cholesterol. Choose foods low in cholesterol. Ask your dietitian how much cholesterol you should have each day. Check food labels for the amount  of sodium. Choose foods with less than 140 milligrams (mg) per serving. Shopping Buy dairy products labeled as nonfat (skim) or low-fat (1%). Avoid buying processed or prepackaged foods. These are often high in added sugar, sodium, and fat. Cooking Choose healthy fats when cooking, such as olive oil or canola oil. Cook foods using lower fat methods, such as baking, broiling, boiling, or grilling. Make your own sauces, dressings, and marinades when  possible, instead of buying them. Store-bought sauces, dressings, and marinades are often high in sodium and sugar. Meal planning Eat more home-cooked food and less restaurant, buffet, and fast food. Eat fatty fish at least 2 times each week. Examples of fatty fish include salmon, trout, mackerel, tuna, and herring. If you eat whole eggs, do not eat more than 3 egg yolks per week. What foods are recommended? The items listed may not be a complete list. Talk with your dietitian aboutwhat dietary choices are best for you. Grains Whole wheat or whole grain breads, crackers, cereals, and pasta. Unsweetenedoatmeal. Bulgur. Barley. Quinoa. Brown rice. Whole wheat flour tortillas. Vegetables Fresh or frozen vegetables. Low-sodium canned vegetables. Fruits All fresh, canned (in natural juice), or frozen fruits. Meats and other protein foods Skinless chicken or [place]. Ground chicken or [place]. Lean cuts of pork, trimmed of fat. Fish and seafood, especially salmon, trout, and herring. Egg whites. Dried beans, peas, or lentils. Unsalted nuts or seeds. Unsalted cannedbeans. Natural peanut or almond butter. Dairy Low-fat dairy products. Skim or low-fat (1%) milk. Reduced fat (2%) and low-sodium cheese. Low-fat ricotta cheese. Low-fat cottage cheese. Plain,low-fat yogurt. Fats and oils Tub margarine without trans fats. Light or reduced-fat mayonnaise. Light or reduced-fat salad dressings.Avocado. Safflower, olive, sunflower, soybean, and canola oils. What foods are not recommended? The items listed may not be a complete list. Talk with your dietitian aboutwhat dietary choices are best for you. Grains White bread. White (regular) pasta. White rice. Cornbread. Bagels. Pastries. Crackers that contain trans fat. Vegetables Creamed or fried vegetables. Vegetables in a cheese sauce. Fruits Sweetened dried fruit. Canned fruit in syrup. Fruit juice. Meats and other protein foods Fatty cuts of meat. Ribs.  Chicken wings. Berniece Salines. Sausage. Bologna. Salami.Chitterlings. Fatback. Hot dogs. Bratwurst. Packaged lunch meats. Dairy Whole or reduced-fat (2%) milk. Half-and-half. Cream cheese. Full-fat or sweetened yogurt. Full-fat cheese. Nondairy creamers. Whipped toppings.Processed cheese or cheese spreads. Cheese curds. Beverages Alcohol. Sweetened drinks, such as soda, lemonade, fruit drinks, or punches. Fats and oils Butter. Stick margarine. Lard. Shortening. Ghee. Bacon fat. Tropical oils, suchas coconut, palm kernel, or palm oils. Sweets and desserts Corn syrup. Sugars. Honey. Molasses. Candy. Jam and jelly. Syrup. Sweetenedcereals. Cookies. Pies. Cakes. Donuts. Muffins. Ice cream. Condiments Store-bought sauces, dressings, and marinades that are high in sugar, such asketchup and barbecue sauce. Summary High levels of triglycerides can increase the risk of heart disease and stroke. Choosing the right foods can help lower your triglycerides. Eat plenty of fresh fruits, vegetables, and whole grains. Choose low-fat dairy and lean meats. Eat fatty fish at least twice a week. Avoid processed and prepackaged foods with added sugar, sodium, saturated fat, and trans fat. If you need suggestions or have questions about what types of food are good for you, talk with your health care provider or a dietitian. This information is not intended to replace advice given to you by your health care provider. Make sure you discuss any questions you have with your healthcare provider. Document Revised: 11/15/2019 Document Reviewed: 11/17/2019 Elsevier Patient Education  2022 [employer].  Thank you for choosing [facility]

## 2021-04-03 ENCOUNTER — Telehealth: Payer: Self-pay | Admitting: Cardiology

## 2021-04-03 NOTE — Telephone Encounter (Signed)
Left message for patient on secure patient 's voice mail.   He will need   Hepatic  Lipid  Uric acid   BMP  He will not need labslip - [facility] should be able to pull from their system   Any question can call back

## 2021-04-03 NOTE — Telephone Encounter (Signed)
Pt was seen on 03/12/21 and given a Lab Order but he misplaced his lab order and he wants to know if he will need the paper lab order to get his lab work in [place]. Please advise pt further

## 2021-04-04 DIAGNOSIS — E1142 Type 2 diabetes mellitus with diabetic polyneuropathy: Secondary | ICD-10-CM | POA: Diagnosis not present

## 2021-04-04 DIAGNOSIS — E785 Hyperlipidemia, unspecified: Secondary | ICD-10-CM | POA: Diagnosis not present

## 2021-04-04 DIAGNOSIS — E1169 Type 2 diabetes mellitus with other specified complication: Secondary | ICD-10-CM | POA: Diagnosis not present

## 2021-04-04 DIAGNOSIS — B351 Tinea unguium: Secondary | ICD-10-CM | POA: Diagnosis not present

## 2021-04-05 DIAGNOSIS — H353221 Exudative age-related macular degeneration, left eye, with active choroidal neovascularization: Secondary | ICD-10-CM | POA: Diagnosis not present

## 2021-04-05 LAB — HEPATIC FUNCTION PANEL
ALT: 17 IU/L (ref 0–44)
AST: 19 IU/L (ref 0–40)
Albumin: 4.8 g/dL — ABNORMAL HIGH (ref 3.7–4.7)
Alkaline Phosphatase: 87 IU/L (ref 44–121)
Bilirubin Total: 0.4 mg/dL (ref 0.0–1.2)
Bilirubin, Direct: 0.16 mg/dL (ref 0.00–0.40)
Total Protein: 6.8 g/dL (ref 6.0–8.5)

## 2021-04-05 LAB — BASIC METABOLIC PANEL
BUN/Creatinine Ratio: 16 (ref 10–24)
BUN: 16 mg/dL (ref 8–27)
CO2: 25 mmol/L (ref 20–29)
Calcium: 9.5 mg/dL (ref 8.6–10.2)
Chloride: 103 mmol/L (ref 96–106)
Creatinine, Ser: 0.97 mg/dL (ref 0.76–1.27)
Glucose: 127 mg/dL — ABNORMAL HIGH (ref 65–99)
Potassium: 4.1 mmol/L (ref 3.5–5.2)
Sodium: 145 mmol/L — ABNORMAL HIGH (ref 134–144)
eGFR: 80 mL/min/{1.73_m2} (ref >59)

## 2021-04-05 LAB — LIPID PANEL
Chol/HDL Ratio: 4 ratio (ref 0.0–5.0)
Cholesterol, Total: 153 mg/dL (ref 100–199)
HDL: 38 mg/dL — ABNORMAL LOW (ref >39)
LDL Chol Calc (NIH): 94 mg/dL (ref 0–99)
Triglycerides: 115 mg/dL (ref 0–149)
VLDL Cholesterol Cal: 21 mg/dL (ref 5–40)

## 2021-04-05 LAB — URIC ACID: Uric Acid: 5.1 mg/dL (ref 3.8–8.4)

## 2021-04-09 ENCOUNTER — Telehealth: Payer: Self-pay | Admitting: Cardiology

## 2021-04-09 MED ORDER — EZETIMIBE 10 MG PO TABS: 10.0000 mg | ORAL_TABLET | Freq: Every day | ORAL | 3 refills | Status: DC

## 2021-04-09 MED ORDER — SIMVASTATIN 20 MG PO TABS: 20.0000 mg | ORAL_TABLET | Freq: Every day | ORAL | 3 refills | Status: DC

## 2021-04-09 NOTE — Telephone Encounter (Signed)
Returned call to patient who states that he was calling in regard to recent medication changes to his cholesterol medications that Erasmo Downer (PharmD) made for him. Patient states that he was waiting on phone call from Erasmo Downer to review his lab results and see if any changes would need to be made. Patient was wanting to see if Erasmo Downer can give him a call when able to discuss. Advised patient I would forward message to her for review and advice. Patient verbalized understanding.

## 2021-04-09 NOTE — Telephone Encounter (Signed)
Arthur Lutz is calling requesting to speak with Arthur Lutz in regards to his cholesterol and lab results

## 2021-04-09 NOTE — Telephone Encounter (Signed)
Spoke with patient.  He took Nexlizet samples for 4 weeks, and then had blood work done.  LDL was essentially unchanged from when he was on simvastatin 10 and ezetimibe.  Uric acid level was well WNL.  Suggested that he would need to be on all 3 medications in order to get LDL cholesterol at goal.  Patient not willing to take so many medications.  Discussed simvastatin, says he has always done well, with no myalgias.  Will have him increase dose to 20 mg and resume ezetimibe.  Also notes that he continues to have some edema in his lower legs on regular basis. Home BP readings all good, so advised that he cut amlodipine from 10 mg to 5 mg once daily.  He is scheduled to see Dr. Ellyn Hack in October and can determine any improvement at that time.

## 2021-04-12 DIAGNOSIS — N400 Enlarged prostate without lower urinary tract symptoms: Secondary | ICD-10-CM | POA: Diagnosis not present

## 2021-04-12 DIAGNOSIS — Z6836 Body mass index (BMI) 36.0-36.9, adult: Secondary | ICD-10-CM | POA: Diagnosis not present

## 2021-04-12 DIAGNOSIS — E6609 Other obesity due to excess calories: Secondary | ICD-10-CM | POA: Diagnosis not present

## 2021-04-12 DIAGNOSIS — E1165 Type 2 diabetes mellitus with hyperglycemia: Secondary | ICD-10-CM | POA: Diagnosis not present

## 2021-04-20 DIAGNOSIS — R0602 Shortness of breath: Secondary | ICD-10-CM | POA: Diagnosis not present

## 2021-04-27 DIAGNOSIS — H524 Presbyopia: Secondary | ICD-10-CM | POA: Diagnosis not present

## 2021-04-28 ENCOUNTER — Other Ambulatory Visit: Payer: Self-pay | Admitting: Cardiology

## 2021-04-30 ENCOUNTER — Telehealth: Payer: Self-pay | Admitting: Cardiology

## 2021-04-30 DIAGNOSIS — R001 Bradycardia, unspecified: Secondary | ICD-10-CM

## 2021-04-30 DIAGNOSIS — Z9861 Coronary angioplasty status: Secondary | ICD-10-CM

## 2021-04-30 DIAGNOSIS — I2510 Atherosclerotic heart disease of native coronary artery without angina pectoris: Secondary | ICD-10-CM

## 2021-04-30 NOTE — Telephone Encounter (Signed)
STAT if HR is under 50 or over 120 (normal HR is 60-100 beats per minute)  What is your heart rate?  Patient is not sure what his HR is currently, but he states his apple watch notified him 3-4 times that it dropped below 40 when he was at rest  Do you have a log of your heart rate readings (document readings)?  No log available   Do you have any other symptoms?  No

## 2021-04-30 NOTE — Telephone Encounter (Signed)
I was hoping his heart rate would not go down with carvedilol.  Lets have him wean off carvedilol by taking it 1/2 tablet twice daily for a week and then stop.  Start spironolactone 25 mg daily and recheck chemistry panel in 1 week.  Glenetta Hew, MD  Discontinued carvedilol by weaning off. New Rx: Spironolactone 25 mg p.o. daily.  Dispense 90 tabs, 3 refills.

## 2021-04-30 NOTE — Telephone Encounter (Signed)
Received call from patient-patient concerned about low HR.   He states he went to his PCP last week for a refill and the nurse checked his HR and it was 42 resting.   She checked this manually as well and it was still in the 40s.   She was concerned and he states he has been told it was low several times in the last 6 months.   He bought an apple watch and he is home resting today and he has been notified multiple times that his HR is below 40 for 10-15 mins.   He reports fatigue and intermittent dizziness/lightheadedness.   Denies any recent falls.   Reports when he gets up to move around, HR does increase.   Reports BP has been around 160/70s.   He is unable to check HR currently but denies symptoms other than fatigue.   He reports taking all of his BP meds at lunch.   When discussing medications-he is unsure if he is taking coreg BID but he will confirm with wife who manages his medications.   He believes he only takes his cholesterol meds at night.     Per chart review: VV with Dr. Ellyn Hack 7/8:    Essential hypertension (Chronic)      Blood pressure has been running quite high.  He has had some readings as high as 160/90.  Somewhat resistant hypertension at this point.   He is on HCTZ, lisinopril 40 and amlodipine 10.  He will clearly require additional medication.     I am providing prescription for Lasix to take as needed for edema.  I do not want him to take both HCTZ and Lasix. (But potentially consider converting from HCTZ to chlorthalidone, but if heart rate will let us tolerate beta-blocker at that much would probably also need to consider hydralazine)   Plan:: Add carvedilol 3.25 mg twice daily. [facility] follow-up to titrate medications.  Hopefully heart rate will tolerate beta-blocker.       OV 8/15 with pharmD-HR 33 Essential hypertension Patient with essential hypertension, currently well controlled.  He is tolerating carvedilol, and is aware to  watch for pulse < 50, but no issues to date.  Will have him continue with lisinopril, amlodipine and carvedilol.  He is also aware that the anti-inflammatory medications may increase his pressure.  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  Will continue to monitor.     Advised to confirm carvedilol dosage and frequency and will send to Dr. Ellyn Hack to review for recommendations.   Advised to hold coreg for now.

## 2021-05-02 MED ORDER — SPIRONOLACTONE 25 MG PO TABS: 25.0000 mg | ORAL_TABLET | Freq: Every day | ORAL | 1 refills | Status: DC

## 2021-05-02 NOTE — Telephone Encounter (Signed)
Spoke to patient . Patient states he was taking carvedilol once a day - both tablets at the same time. Patient states his wife  fixed medication . She palced both dose in the compartment.  After calling in 2 days ago - he stop taking carvedilol all together.   RN informed patient of Dr Ellyn Hack instructions. Wean carvedilol for one week   Start 25 mg spironolactone daily   Labs in one week  BMP - ( patient will do labs in [place] [facility])    Patient verbalized understanding   Aware to keep a log and bring it with him to next appointment 05/21/21

## 2021-05-12 ENCOUNTER — Inpatient Hospital Stay (HOSPITAL_COMMUNITY)
Admission: EM | Admit: 2021-05-12 | Discharge: 2021-05-16 | DRG: 177 | Disposition: A | Discharge status: Home / Self Care | Payer: Medicare Other | Attending: Internal Medicine | Admitting: Internal Medicine

## 2021-05-12 ENCOUNTER — Other Ambulatory Visit: Payer: Self-pay

## 2021-05-12 ENCOUNTER — Emergency Department (HOSPITAL_COMMUNITY): Payer: Medicare Other

## 2021-05-12 ENCOUNTER — Encounter (HOSPITAL_COMMUNITY): Payer: Self-pay | Admitting: Emergency Medicine

## 2021-05-12 DIAGNOSIS — M25552 Pain in left hip: Secondary | ICD-10-CM

## 2021-05-12 DIAGNOSIS — G9341 Metabolic encephalopathy: Secondary | ICD-10-CM | POA: Diagnosis not present

## 2021-05-12 DIAGNOSIS — I10 Essential (primary) hypertension: Secondary | ICD-10-CM | POA: Diagnosis not present

## 2021-05-12 DIAGNOSIS — Z7984 Long term (current) use of oral hypoglycemic drugs: Secondary | ICD-10-CM

## 2021-05-12 DIAGNOSIS — R197 Diarrhea, unspecified: Secondary | ICD-10-CM | POA: Diagnosis present

## 2021-05-12 DIAGNOSIS — H3530 Unspecified macular degeneration: Secondary | ICD-10-CM | POA: Diagnosis present

## 2021-05-12 DIAGNOSIS — R7989 Other specified abnormal findings of blood chemistry: Secondary | ICD-10-CM | POA: Diagnosis present

## 2021-05-12 DIAGNOSIS — R072 Precordial pain: Secondary | ICD-10-CM | POA: Diagnosis not present

## 2021-05-12 DIAGNOSIS — A419 Sepsis, unspecified organism: Secondary | ICD-10-CM | POA: Diagnosis present

## 2021-05-12 DIAGNOSIS — E860 Dehydration: Secondary | ICD-10-CM | POA: Diagnosis not present

## 2021-05-12 DIAGNOSIS — U071 COVID-19: Principal | ICD-10-CM

## 2021-05-12 DIAGNOSIS — E1165 Type 2 diabetes mellitus with hyperglycemia: Secondary | ICD-10-CM | POA: Diagnosis present

## 2021-05-12 DIAGNOSIS — R609 Edema, unspecified: Secondary | ICD-10-CM | POA: Diagnosis not present

## 2021-05-12 DIAGNOSIS — R1030 Lower abdominal pain, unspecified: Secondary | ICD-10-CM | POA: Diagnosis not present

## 2021-05-12 DIAGNOSIS — Z85820 Personal history of malignant melanoma of skin: Secondary | ICD-10-CM

## 2021-05-12 DIAGNOSIS — E118 Type 2 diabetes mellitus with unspecified complications: Secondary | ICD-10-CM | POA: Diagnosis not present

## 2021-05-12 DIAGNOSIS — E669 Obesity, unspecified: Secondary | ICD-10-CM | POA: Diagnosis not present

## 2021-05-12 DIAGNOSIS — Z6835 Body mass index (BMI) 35.0-35.9, adult: Secondary | ICD-10-CM | POA: Diagnosis not present

## 2021-05-12 DIAGNOSIS — M1612 Unilateral primary osteoarthritis, left hip: Secondary | ICD-10-CM | POA: Diagnosis not present

## 2021-05-12 DIAGNOSIS — R0902 Hypoxemia: Secondary | ICD-10-CM | POA: Diagnosis not present

## 2021-05-12 DIAGNOSIS — I252 Old myocardial infarction: Secondary | ICD-10-CM

## 2021-05-12 DIAGNOSIS — I25118 Atherosclerotic heart disease of native coronary artery with other forms of angina pectoris: Secondary | ICD-10-CM

## 2021-05-12 DIAGNOSIS — J9601 Acute respiratory failure with hypoxia: Secondary | ICD-10-CM | POA: Diagnosis present

## 2021-05-12 DIAGNOSIS — N281 Cyst of kidney, acquired: Secondary | ICD-10-CM | POA: Diagnosis not present

## 2021-05-12 DIAGNOSIS — Z9861 Coronary angioplasty status: Secondary | ICD-10-CM

## 2021-05-12 DIAGNOSIS — R1011 Right upper quadrant pain: Secondary | ICD-10-CM

## 2021-05-12 DIAGNOSIS — D6959 Other secondary thrombocytopenia: Secondary | ICD-10-CM | POA: Diagnosis not present

## 2021-05-12 DIAGNOSIS — I2510 Atherosclerotic heart disease of native coronary artery without angina pectoris: Secondary | ICD-10-CM | POA: Diagnosis not present

## 2021-05-12 DIAGNOSIS — N400 Enlarged prostate without lower urinary tract symptoms: Secondary | ICD-10-CM | POA: Diagnosis not present

## 2021-05-12 DIAGNOSIS — E7800 Pure hypercholesterolemia, unspecified: Secondary | ICD-10-CM | POA: Diagnosis present

## 2021-05-12 DIAGNOSIS — Z8673 Personal history of transient ischemic attack (TIA), and cerebral infarction without residual deficits: Secondary | ICD-10-CM

## 2021-05-12 DIAGNOSIS — K5730 Diverticulosis of large intestine without perforation or abscess without bleeding: Secondary | ICD-10-CM | POA: Diagnosis not present

## 2021-05-12 DIAGNOSIS — Z87891 Personal history of nicotine dependence: Secondary | ICD-10-CM

## 2021-05-12 DIAGNOSIS — R079 Chest pain, unspecified: Secondary | ICD-10-CM | POA: Diagnosis not present

## 2021-05-12 DIAGNOSIS — R1084 Generalized abdominal pain: Secondary | ICD-10-CM | POA: Diagnosis not present

## 2021-05-12 DIAGNOSIS — B999 Unspecified infectious disease: Secondary | ICD-10-CM

## 2021-05-12 DIAGNOSIS — Z801 Family history of malignant neoplasm of trachea, bronchus and lung: Secondary | ICD-10-CM

## 2021-05-12 DIAGNOSIS — E785 Hyperlipidemia, unspecified: Secondary | ICD-10-CM | POA: Diagnosis present

## 2021-05-12 DIAGNOSIS — J1282 Pneumonia due to coronavirus disease 2019: Secondary | ICD-10-CM | POA: Diagnosis present

## 2021-05-12 DIAGNOSIS — R0789 Other chest pain: Secondary | ICD-10-CM | POA: Diagnosis not present

## 2021-05-12 DIAGNOSIS — I878 Other specified disorders of veins: Secondary | ICD-10-CM | POA: Diagnosis present

## 2021-05-12 DIAGNOSIS — I4510 Unspecified right bundle-branch block: Secondary | ICD-10-CM | POA: Diagnosis not present

## 2021-05-12 DIAGNOSIS — E1169 Type 2 diabetes mellitus with other specified complication: Secondary | ICD-10-CM | POA: Diagnosis not present

## 2021-05-12 DIAGNOSIS — Z955 Presence of coronary angioplasty implant and graft: Secondary | ICD-10-CM

## 2021-05-12 DIAGNOSIS — Z79899 Other long term (current) drug therapy: Secondary | ICD-10-CM

## 2021-05-12 DIAGNOSIS — R0609 Other forms of dyspnea: Secondary | ICD-10-CM

## 2021-05-12 DIAGNOSIS — N189 Chronic kidney disease, unspecified: Secondary | ICD-10-CM | POA: Diagnosis not present

## 2021-05-12 DIAGNOSIS — Z8551 Personal history of malignant neoplasm of bladder: Secondary | ICD-10-CM

## 2021-05-12 DIAGNOSIS — N179 Acute kidney failure, unspecified: Secondary | ICD-10-CM

## 2021-05-12 DIAGNOSIS — Z825 Family history of asthma and other chronic lower respiratory diseases: Secondary | ICD-10-CM

## 2021-05-12 DIAGNOSIS — I358 Other nonrheumatic aortic valve disorders: Secondary | ICD-10-CM | POA: Diagnosis not present

## 2021-05-12 DIAGNOSIS — I517 Cardiomegaly: Secondary | ICD-10-CM | POA: Diagnosis not present

## 2021-05-12 DIAGNOSIS — Z5320 Procedure and treatment not carried out because of patient's decision for unspecified reasons: Secondary | ICD-10-CM | POA: Diagnosis not present

## 2021-05-12 DIAGNOSIS — K7689 Other specified diseases of liver: Secondary | ICD-10-CM | POA: Diagnosis not present

## 2021-05-12 DIAGNOSIS — Z96653 Presence of artificial knee joint, bilateral: Secondary | ICD-10-CM | POA: Diagnosis present

## 2021-05-12 LAB — CBC WITH DIFFERENTIAL/PLATELET
Abs Immature Granulocytes: 0.03 10*3/uL (ref 0.00–0.07)
Basophils Absolute: 0.1 10*3/uL (ref 0.0–0.1)
Basophils Relative: 1 %
Eosinophils Absolute: 0.1 10*3/uL (ref 0.0–0.5)
Eosinophils Relative: 2 %
HCT: 41.9 % (ref 39.0–52.0)
Hemoglobin: 13.4 g/dL (ref 13.0–17.0)
Immature Granulocytes: 0 %
Lymphocytes Relative: 13 %
Lymphs Abs: 0.9 10*3/uL (ref 0.7–4.0)
MCH: 30.8 pg (ref 26.0–34.0)
MCHC: 32 g/dL (ref 30.0–36.0)
MCV: 96.3 fL (ref 80.0–100.0)
Monocytes Absolute: 1 10*3/uL (ref 0.1–1.0)
Monocytes Relative: 14 %
Neutro Abs: 4.8 10*3/uL (ref 1.7–7.7)
Neutrophils Relative %: 70 %
Platelets: 135 10*3/uL — ABNORMAL LOW (ref 150–400)
RBC: 4.35 MIL/uL (ref 4.22–5.81)
RDW: 13 % (ref 11.5–15.5)
WBC: 6.8 10*3/uL (ref 4.0–10.5)
nRBC: 0 % (ref 0.0–0.2)

## 2021-05-12 LAB — RESP PANEL BY RT-PCR (FLU A&B, COVID) ARPGX2
Influenza A by PCR: NEGATIVE
Influenza B by PCR: NEGATIVE
SARS Coronavirus 2 by RT PCR: POSITIVE — AB

## 2021-05-12 LAB — COMPREHENSIVE METABOLIC PANEL
ALT: 18 U/L (ref 0–44)
AST: 18 U/L (ref 15–41)
Albumin: 3.9 g/dL (ref 3.5–5.0)
Alkaline Phosphatase: 65 U/L (ref 38–126)
Anion gap: 10 (ref 5–15)
BUN: 19 mg/dL (ref 8–23)
CO2: 28 mmol/L (ref 22–32)
Calcium: 9.3 mg/dL (ref 8.9–10.3)
Chloride: 104 mmol/L (ref 98–111)
Creatinine, Ser: 1.71 mg/dL — ABNORMAL HIGH (ref 0.61–1.24)
GFR, Estimated: 41 mL/min — ABNORMAL LOW (ref >60)
Glucose, Bld: 126 mg/dL — ABNORMAL HIGH (ref 70–99)
Potassium: 3.8 mmol/L (ref 3.5–5.1)
Sodium: 142 mmol/L (ref 135–145)
Total Bilirubin: 1.2 mg/dL (ref 0.3–1.2)
Total Protein: 6.5 g/dL (ref 6.5–8.1)

## 2021-05-12 LAB — URINALYSIS, ROUTINE W REFLEX MICROSCOPIC
Bacteria, UA: NONE SEEN
Bilirubin Urine: NEGATIVE
Glucose, UA: 150 mg/dL — AB
Hgb urine dipstick: NEGATIVE
Ketones, ur: 20 mg/dL — AB
Leukocytes,Ua: NEGATIVE
Nitrite: NEGATIVE
Protein, ur: 100 mg/dL — AB
Specific Gravity, Urine: 1.014 (ref 1.005–1.030)
pH: 9 — ABNORMAL HIGH (ref 5.0–8.0)

## 2021-05-12 LAB — FERRITIN: Ferritin: 42 ng/mL (ref 24–336)

## 2021-05-12 LAB — GLUCOSE, CAPILLARY: Glucose-Capillary: 180 mg/dL — ABNORMAL HIGH (ref 70–99)

## 2021-05-12 LAB — BRAIN NATRIURETIC PEPTIDE: B Natriuretic Peptide: 8.9 pg/mL (ref 0.0–100.0)

## 2021-05-12 LAB — FIBRINOGEN: Fibrinogen: 271 mg/dL (ref 210–475)

## 2021-05-12 LAB — TROPONIN I (HIGH SENSITIVITY)
Troponin I (High Sensitivity): 40 ng/L — ABNORMAL HIGH (ref <18)
Troponin I (High Sensitivity): 40 ng/L — ABNORMAL HIGH (ref <18)

## 2021-05-12 LAB — D-DIMER, QUANTITATIVE: D-Dimer, Quant: >20 ug/mL-FEU — ABNORMAL HIGH (ref 0.00–0.50)

## 2021-05-12 LAB — C-REACTIVE PROTEIN: CRP: 2 mg/dL — ABNORMAL HIGH (ref <1.0)

## 2021-05-12 LAB — LIPASE, BLOOD: Lipase: 26 U/L (ref 11–51)

## 2021-05-12 MED ORDER — HYDRALAZINE HCL 20 MG/ML IJ SOLN: 5.0000 mg | Freq: Four times a day (QID) | INTRAMUSCULAR | Status: DC | PRN

## 2021-05-12 MED ORDER — AZELASTINE HCL 0.1 % NA SOLN
1.0000 | Freq: Two times a day (BID) | NASAL | Status: DC
  Administered 2021-05-12 – 2021-05-16 (×8): 1 via NASAL
  Filled 2021-05-12 (×2): qty 30

## 2021-05-12 MED ORDER — ISOSORBIDE MONONITRATE ER 60 MG PO TB24
60.0000 mg | ORAL_TABLET | Freq: Every day | ORAL | Status: DC
  Administered 2021-05-12 – 2021-05-16 (×5): 60 mg via ORAL
  Filled 2021-05-12: qty 2
  Filled 2021-05-12 (×4): qty 1

## 2021-05-12 MED ORDER — METHYLPREDNISOLONE SODIUM SUCC 125 MG IJ SOLR
60.0000 mg | Freq: Once | INTRAMUSCULAR | Status: AC
  Administered 2021-05-12: 60 mg via INTRAVENOUS
  Filled 2021-05-12: qty 2

## 2021-05-12 MED ORDER — HYDRALAZINE HCL 20 MG/ML IJ SOLN: 5.0000 mg | Freq: Once | INTRAMUSCULAR | Status: DC

## 2021-05-12 MED ORDER — FLUTICASONE PROPIONATE 50 MCG/ACT NA SUSP: 2.0000 | Freq: Every day | NASAL | Status: DC | PRN

## 2021-05-12 MED ORDER — CO Q-10 100 MG PO CAPS: 100.0000 | ORAL_CAPSULE | Freq: Every day | ORAL | Status: DC

## 2021-05-12 MED ORDER — NITROGLYCERIN 0.4 MG SL SUBL
0.4000 mg | SUBLINGUAL_TABLET | SUBLINGUAL | Status: DC | PRN
  Administered 2021-05-12 (×3): 0.4 mg via SUBLINGUAL
  Filled 2021-05-12: qty 1

## 2021-05-12 MED ORDER — ZINC SULFATE 220 (50 ZN) MG PO CAPS
220.0000 mg | ORAL_CAPSULE | Freq: Every day | ORAL | Status: DC
  Administered 2021-05-12 – 2021-05-16 (×5): 220 mg via ORAL
  Filled 2021-05-12 (×6): qty 1

## 2021-05-12 MED ORDER — ACETAMINOPHEN 325 MG PO TABS
650.0000 mg | ORAL_TABLET | Freq: Once | ORAL | Status: AC
  Administered 2021-05-12: 650 mg via ORAL
  Filled 2021-05-12: qty 2

## 2021-05-12 MED ORDER — PROSIGHT PO TABS
1.0000 | ORAL_TABLET | Freq: Two times a day (BID) | ORAL | Status: DC
  Administered 2021-05-13 – 2021-05-16 (×7): 1 via ORAL
  Filled 2021-05-12 (×8): qty 1

## 2021-05-12 MED ORDER — VITAMIN D 25 MCG (1000 UNIT) PO TABS
5000.0000 [IU] | ORAL_TABLET | Freq: Every day | ORAL | Status: DC
  Administered 2021-05-13 – 2021-05-16 (×4): 5000 [IU] via ORAL
  Filled 2021-05-12 (×4): qty 5

## 2021-05-12 MED ORDER — TRIAZOLAM 0.125 MG PO TABS
0.1875 mg | ORAL_TABLET | Freq: Every day | ORAL | Status: DC
  Administered 2021-05-12 – 2021-05-14 (×3): 0.1875 mg via ORAL
  Filled 2021-05-12 (×6): qty 2

## 2021-05-12 MED ORDER — INSULIN ASPART 100 UNIT/ML IJ SOLN
0.0000 [IU] | Freq: Three times a day (TID) | INTRAMUSCULAR | Status: DC
  Administered 2021-05-13: 5 [IU] via SUBCUTANEOUS
  Administered 2021-05-13: 2 [IU] via SUBCUTANEOUS
  Administered 2021-05-13: 3 [IU] via SUBCUTANEOUS
  Administered 2021-05-14: 2 [IU] via SUBCUTANEOUS
  Administered 2021-05-14: 1 [IU] via SUBCUTANEOUS
  Administered 2021-05-14: 3 [IU] via SUBCUTANEOUS
  Administered 2021-05-15 – 2021-05-16 (×3): 2 [IU] via SUBCUTANEOUS

## 2021-05-12 MED ORDER — EZETIMIBE 10 MG PO TABS
10.0000 mg | ORAL_TABLET | Freq: Every day | ORAL | Status: DC
  Administered 2021-05-13 – 2021-05-16 (×4): 10 mg via ORAL
  Filled 2021-05-12 (×4): qty 1

## 2021-05-12 MED ORDER — ACETAMINOPHEN 325 MG PO TABS
650.0000 mg | ORAL_TABLET | Freq: Four times a day (QID) | ORAL | Status: DC | PRN
  Administered 2021-05-13 (×2): 650 mg via ORAL
  Filled 2021-05-12 (×2): qty 2

## 2021-05-12 MED ORDER — HEPARIN (PORCINE) 25000 UT/250ML-% IV SOLN
2000.0000 [IU]/h | INTRAVENOUS | Status: DC
  Administered 2021-05-12: 1450 [IU]/h via INTRAVENOUS
  Administered 2021-05-13: 1800 [IU]/h via INTRAVENOUS
  Administered 2021-05-13 – 2021-05-15 (×3): 2000 [IU]/h via INTRAVENOUS
  Filled 2021-05-12 (×5): qty 250

## 2021-05-12 MED ORDER — SODIUM CHLORIDE 0.9 % IV SOLN: INTRAVENOUS | Status: DC

## 2021-05-12 MED ORDER — FLUTICASONE PROPIONATE 93 MCG/ACT NA EXHU: 2.0000 "application " | INHALANT_SUSPENSION | NASAL | Status: DC | PRN

## 2021-05-12 MED ORDER — ASCORBIC ACID 500 MG PO TABS
1000.0000 mg | ORAL_TABLET | Freq: Two times a day (BID) | ORAL | Status: DC
  Administered 2021-05-12 – 2021-05-16 (×8): 1000 mg via ORAL
  Filled 2021-05-12 (×8): qty 2

## 2021-05-12 MED ORDER — HEPARIN BOLUS VIA INFUSION
5000.0000 [IU] | Freq: Once | INTRAVENOUS | Status: AC
  Administered 2021-05-12: 5000 [IU] via INTRAVENOUS
  Filled 2021-05-12: qty 5000

## 2021-05-12 MED ORDER — ALBUTEROL SULFATE HFA 108 (90 BASE) MCG/ACT IN AERS
2.0000 | INHALATION_SPRAY | Freq: Four times a day (QID) | RESPIRATORY_TRACT | Status: DC | PRN
  Filled 2021-05-12: qty 6.7

## 2021-05-12 MED ORDER — MAGNESIUM GLUCONATE 500 MG PO TABS
500.0000 mg | ORAL_TABLET | Freq: Every day | ORAL | Status: DC
  Administered 2021-05-12 – 2021-05-15 (×4): 500 mg via ORAL
  Filled 2021-05-12 (×8): qty 1

## 2021-05-12 MED ORDER — SIMVASTATIN 20 MG PO TABS
20.0000 mg | ORAL_TABLET | Freq: Every day | ORAL | Status: DC
  Administered 2021-05-12 – 2021-05-15 (×4): 20 mg via ORAL
  Filled 2021-05-12 (×4): qty 1

## 2021-05-12 MED ORDER — ASCORBIC ACID 500 MG PO TABS
500.0000 mg | ORAL_TABLET | Freq: Every day | ORAL | Status: DC
  Filled 2021-05-12: qty 1

## 2021-05-12 MED ORDER — HYDRALAZINE HCL 20 MG/ML IJ SOLN: 10.0000 mg | Freq: Once | INTRAMUSCULAR | Status: DC

## 2021-05-12 NOTE — ED Provider Notes (Signed)
Emergency Medicine Provider Triage Evaluation Note  Arthur Lutz , a 80 y.o. male  was evaluated in triage.  Pt complains of chest pain.  Describes it more as a fullness, comes and goes.  Unable to recognize a pattern, not worse with exertion.  Patient also complains of right-sided flank pain and abdominal pain which is going on for 2 months.  Reports he flew to [place] and had an 18-hour drive in the last week.  Denies any pleuritic pain, not on any blood thinners.  No unilateral leg swelling.    Patient did not take his blood pressure medicine this morning..  Review of Systems  Positive: Chest pain, nausea, right flank pain, abdominal pain Negative: Fever, hematuria we will start with labs and troponin.    Physical Exam  BP (!) 218/84 (BP Location: Left Arm)   Pulse 64   Temp 98.8 F (37.1 C) (Oral)   Resp 16   SpO2 95%  Gen:   Awake, no distress   Resp:  Normal effort  MSK:   Moves extremities without difficulty  Other:  Radial pulse 2+ bilaterally, DP and PT 2+ bilaterally.  Legs are symmetric bilaterally, no pitting edema.    Medical Decision Making  Medically screening exam initiated at 10:38 AM.  Appropriate orders placed.  LANDAN FEDIE was informed that the remainder of the evaluation will be completed by another provider, this initial triage assessment does not replace that evaluation, and the importance of remaining in the ED until their evaluation is complete.  Labs, ultrasound.  Patient has multiple risk factors, given the longevity of symptoms do not appear to be an acute dissection.  Also pulses are even at this time.  PE on differential given recent travel.  ACS also consideration.  Patient should go back to room sooner than later.   Sherrill Raring, PA-C 05/12/21 1042    Davonna Belling, MD 05/12/21 1056

## 2021-05-12 NOTE — Consult Note (Signed)
Cardiology Consult Note:   Patient ID: Arthur Lutz MRN: [id number]; DOB: Apr 10, 1941   Admission date: 05/12/2021  PCP:  Redmond School, MD   [facility] Providers Cardiologist:  Glenetta Hew, MD       Chief Complaint:  Chest pain  Patient Profile:   Arthur Lutz is a 80 y.o. male with CAD s/p PCI in 2013 [place]), HTN with DM, HLD, Stroke,and biliary disease who is being seen 05/12/2021 for the evaluation of chest pain in the setting of SOB, AKI and abdominal pain.  History of Present Illness:   Arthur Lutz has had significant discomfort over the past two months.  Patient notes that he has been feeling chest pain, abdominal pain that has worsened for the past two months but it had improved.  Recently had new LE swelling and edema and started lasix.  With this has improvement in SOB and LE edema.  Still has the abdominal edema.  Had a long (16 hour drive back from [place] complicated by dietary indiscretions (hot dogs and fast food)).  But otherwise was well.  On return had chest pain abdominal pain, nausea, vomiting and diarrhea. No fevers chills, or night sweats.  No sick contacts.  Went to ED for full eval.  In the ED has received tylenol and nitroglycerin.  Troponin 40 (h sensitivity) X2.  Has new AKI creatinine 1.7 from 0.98.  Other labs WNL. D dimer sent and is pending.  Past Medical History:  Diagnosis Date   Allergic rhinitis    Aortic valve sclerosis    Cause of murmur   Arthritis of knee, left, needs total Knee in future with Dr. Wynelle Link  08/07/2011   Bladder cancer ([place]) 11/2005   CAD S/P percutaneous coronary angioplasty 08/07/2011   NEW 08/07/11 cutting balloon PTCA to 95% ostial diag. and 90%  proximal LCX.  prior cutting balloon and to RCA  & ostium of ostial diag; Last Cath 02/2012 - Patent Diag PTCA, RCA & Circ BMS stents. Normal EF & EDP; Myoview 2/14 no evidence of ischemia or infarction   Diabetes mellitus type 2, controlled, with complications ([place])    Diabetes  mellitus without complication ([place])    Dyslipidemia    History of heart attack 11/2007   "mild; did not affect the heart muscle"; PCI to RCA, Cutting PTCA Diag ostium   Hypertension    Macular degeneration    Macular degeneration of right eye    Now getting shots.    Melanoma of skin ([place]) ~ 2005   "level 2; on my back"   Myocardial infarction [facility]) 2012   Osteoarthritis    Status post right knee arthroplasty, positive for staph infection. Pending right arthroplasty   Portacath in place 08/11/2012   Statin intolerance 08/07/2011   Stroke ([place]) 1980's   "had facial strokes; 2; about a year apart; never did find what caused them"    Past Surgical History:  Procedure Laterality Date   BIOPSY  05/28/2019   Procedure: BIOPSY;  Surgeon: Rogene Houston, MD;  Location: [facility];  Service: Endoscopy;;  cecum   CARDIAC CATHETERIZATION  August '13   Widely patent RCA and LCx stents. Also patent PTCA site to D1, ~40-50% D2. Normal EF, normal EDP   CATARACT EXTRACTION W/ INTRAOCULAR LENS IMPLANT  ~ 2010   right   CATARACT EXTRACTION W/PHACO Left 11/21/2016   Procedure: CATARACT EXTRACTION PHACO AND INTRAOCULAR LENS PLACEMENT (IOC) CDE - 11.70 ;  Surgeon: Tonny Branch, MD;  Location:  [facility] ORS;  Service: Ophthalmology;  Laterality: Left;  left   cold cup removal bladder lesion  11/2005   "malignant"   COLONOSCOPY N/A 12/09/2014   Procedure: COLONOSCOPY;  Surgeon: Rogene Houston, MD;  Location: [facility];  Service: Endoscopy;  Laterality: N/A;  955 - moved to 8:30 - Ann to notify pt   COLONOSCOPY WITH PROPOFOL N/A 05/28/2019   Procedure: COLONOSCOPY WITH PROPOFOL;  Surgeon: Rogene Houston, MD;  Location: [facility];  Service: Endoscopy;  Laterality: N/A;   CORONARY ANGIOPLASTY WITH STENT PLACEMENT  11/2007   Mid RCA - Driver BMS 3.5 mm x 15 mm; 2.0 mm Cutting Balloon PTCA - ostial D1   CORONARY ANGIOPLASTY WITH STENT PLACEMENT  08/07/11   Abnormal TM Myoview - for exertional throat  discomfort: Patent RCA stent, 90% circumflex -- Vision BMS 3.5 mm x 12 mm --> 4.2 mm. Ostial D2 95% -- 2.25 mm Cutting Balloon PTCA   DOPPLER ECHOCARDIOGRAPHY  May 2009   Normal EF, impaired relaxation, with sclerotic aortic valve. No stenosis.   INGUINAL HERNIA REPAIR  ~ 1970   left   KNEE ARTHROSCOPY  02/20/11   left   LEFT HEART CATHETERIZATION WITH CORONARY ANGIOGRAM N/A 08/07/2011   Procedure: LEFT HEART CATHETERIZATION WITH CORONARY ANGIOGRAM;  Surgeon: Leonie Man, MD;  Location: [facility] CATH LAB;  Service: Cardiovascular;  Laterality: N/A;   LEFT HEART CATHETERIZATION WITH CORONARY ANGIOGRAM N/A 03/06/2012   Procedure: LEFT HEART CATHETERIZATION WITH CORONARY ANGIOGRAM;  Surgeon: Leonie Man, MD;  Location: [facility] CATH LAB;  Service: Cardiovascular;  Laterality: N/A;   NM MYOVIEW LTD  Jan. 31, 2014   Low risk, EF 49%, improved from prior. Small apical and apical septal defect, fixed likely artifact no skin or infarction.   PARTIAL KNEE ARTHROPLASTY  06/2003   right   PERCUTANEOUS CORONARY STENT INTERVENTION (PCI-S)  08/07/2011   Procedure: PERCUTANEOUS CORONARY STENT INTERVENTION (PCI-S);  Surgeon: Leonie Man, MD;  Location: [facility] CATH LAB;  Service: Cardiovascular;;   POLYPECTOMY  05/28/2019   Procedure: POLYPECTOMY;  Surgeon: Rogene Houston, MD;  [facility];  Service: Endoscopy;;  colon   TONSILLECTOMY     "as a child"   TOTAL KNEE ARTHROPLASTY  11/2003   right x2 and one on left- Total of 3.   TOTAL KNEE ARTHROPLASTY Left 09/28/2012   Procedure: TOTAL KNEE ARTHROPLASTY;  Surgeon: Gearlean Alf, MD;  Location: [facility] ORS;  Service: Orthopedics;  Laterality: Left;     Medications Prior to Admission: Prior to Admission medications   Medication Sig Start Date End Date Taking? Authorizing Provider  acetaminophen (TYLENOL) 650 MG CR tablet Take 650 mg by mouth every 8 (eight) hours as needed for pain.    [provider]  amLODipine (NORVASC) 10 MG tablet TAKE 1  TABLET(10 MG) BY MOUTH DAILY Patient taking differently: 5 mg. 02/15/21   Leonie Man, MD  Ascorbic Acid (VITAMIN C) 1000 MG tablet Take 1,000 mg by mouth daily.    [provider]  azelastine (ASTELIN) 0.1 % nasal spray USE 2 SPRAYS IN EACH NOSTRIL TWICE DAILY Patient taking differently: 2 (two) times daily as needed. 09/28/20   [name], FNP  budesonide (PULMICORT) 0.5 MG/2ML nebulizer solution FLUSH 2 ML MIXED WITH NASAL SALINE INTO THE NASAL PASSAGES DAILY AS DIRECTED. 10/27/19   Valentina Shaggy, MD  cetirizine (ZYRTEC ALLERGY) 10 MG tablet Take 1 tablet (10 mg total) by mouth daily. Patient taking differently:  Take 10 mg by mouth as needed. 08/16/20   [name], FNP  Cholecalciferol (VITAMIN D) 50 MCG (2000 UT) tablet Take 2,000 Units by mouth daily.    [provider]  clobetasol (TEMOVATE) 0.05 % external solution Apply 1 application topically daily as needed (irritation).  03/01/19   [provider]  Coenzyme Q10 (CO Q-10) 100 MG CAPS Take 300 capsules by mouth 3 (three) times daily. Patient taking differently: Take 100 capsules by mouth daily. 08/29/17   Leonie Man, MD  cyclobenzaprine (FLEXERIL) 10 MG tablet Take 1 tablet (10 mg total) by mouth 2 (two) times daily as needed for muscle spasms. 03/30/19   Maudie Flakes, MD  EPINEPHrine 0.3 mg/0.3 mL IJ SOAJ injection Inject 0.3 mg into the muscle as needed for anaphylaxis.  12/25/18   [provider]  ezetimibe (ZETIA) 10 MG tablet Take 1 tablet (10 mg total) by mouth daily. 04/09/21 07/08/21  Leonie Man, MD  Fluticasone Propionate Truett Perna) 93 MCG/ACT [place] 2 application into the nose 2 (two) times daily. Patient taking differently: Place 2 application into the nose as needed. 09/08/19   Valentina Shaggy, MD  furosemide (LASIX) 20 MG tablet Take 1 tablet (20 mg total) by mouth as needed. for worsening swelling -- (take instead of HCTZ). 02/02/21 05/03/21  Leonie Man, MD   indomethacin (INDOCIN) 50 MG capsule Take 50 mg by mouth 2 (two) times daily as needed. 03/27/20   [provider]  isosorbide mononitrate (IMDUR) 30 MG 24 hr tablet TAKE 1 TABLET(30 MG) BY MOUTH DAILY 02/15/21   Leonie Man, MD  lisinopril (ZESTRIL) 40 MG tablet TAKE 1/2 TABLET BY MOUTH DAILY 04/30/21   Leonie Man, MD  loperamide (IMODIUM) 2 MG capsule Take 1 capsule (2 mg total) by mouth 4 (four) times daily as needed for diarrhea or loose stools. 03/30/19   Maudie Flakes, MD  LOTEMAX 0.5 % OINT Apply 1 application to eye as needed. 02/15/21   [provider]  magnesium gluconate (MAGONATE) 500 MG tablet Take 500 mg by mouth daily.    [provider]  meloxicam (MOBIC) 15 MG tablet Take 15 mg by mouth daily. 02/22/21   [provider]  metFORMIN (GLUCOPHAGE) 500 MG tablet Take 500 mg by mouth daily with breakfast.     [provider]  Multiple Vitamins-Minerals (PRESERVISION AREDS PO) Take 1 capsule by mouth daily.    [provider]  prednisoLONE acetate (PRED FORTE) 1 % ophthalmic suspension Place 1 drop into both eyes 2 (two) times daily. 03/08/21   [provider]  predniSONE (DELTASONE) 10 MG tablet Take 1 tablet (10 mg total) by mouth daily as needed (arthritis). 3,3, 2, 1 tabs 10/08/19   Leonie Man, MD  simvastatin (ZOCOR) 20 MG tablet Take 1 tablet (20 mg total) by mouth at bedtime. 04/09/21 07/08/21  Leonie Man, MD  spironolactone (ALDACTONE) 25 MG tablet Take 1 tablet (25 mg total) by mouth daily. 05/02/21 07/31/21  Leonie Man, MD  tacrolimus (PROTOPIC) 0.1 % ointment Apply topically 2 (two) times daily. 09/15/19   Valentina Shaggy, MD  tobramycin (TOBREX) 0.3 % ophthalmic solution Place 1 drop into both eyes as needed. 02/20/21   [provider]  triazolam (HALCION) 0.125 MG tablet Take 1.5 tablets (0.1875 mg total) by mouth at bedtime. Pt takes 1.5 tabs at bedtime Patient taking differently:  0.125 mg. Take 2 tablets at bedtime by mouth 08/29/17  Leonie Man, MD     Allergies:    Allergies  Allergen Reactions   Rosuvastatin Calcium Other (See Comments)    Myalgias, tired crestor -aches    Social History:   Social History   Socioeconomic History   Marital status: Married    Spouse name: Not on file   Number of children: Not on file   Years of education: Not on file   Highest education level: Not on file  Occupational History   Occupation: Physicist, medical    Employer: [employer]  Tobacco Use   Smoking status: Former    Packs/day: 2.00    Years: 28.00    Pack years: 56.00    Types: Cigarettes    Quit date: 07/29/1980    Years since quitting: 40.8   Smokeless tobacco: Never  Vaping Use   Vaping Use: Never used  Substance and Sexual Activity   Alcohol use: Yes    Alcohol/week: 2.0 standard drinks    Types: 1 Glasses of wine, 1 Standard drinks or equivalent per week    Comment:  "wine or whiskey" occasionally   Drug use: No   Sexual activity: Never  Other Topics Concern   Not on file  Social History Narrative   He is a married father of 2, grandfather of 66. He is not really getting a lot of exercise now. He previously had been working out on a treadmill at least 2 times a day for 15 minutes at a time, but he   is not able to do that as much now because his knee hurts him too bad. Does not smoke and only occasional alcoholic beverage.   Social Determinants of Health   Financial Resource Strain: Not on file  Food Insecurity: Not on file  Transportation Needs: Not on file  Physical Activity: Not on file  Stress: Not on file  Social Connections: Not on file  Intimate Partner Violence: Not on file    Family History:   The patient's family history includes Emphysema in his father; Lung cancer in his paternal uncle. There is no history of Colon cancer.    ROS:  Please see the history of present illness.  All other ROS reviewed and  negative.     Physical Exam/Data:   Vitals:   05/12/21 1420 05/12/21 1432 05/12/21 1500 05/12/21 1530  BP: (!) 173/82  (!) 171/67 (!) 163/72  Pulse: (!) 45  (!) 57 (!) 46  Resp: (!) 22  (!) 21 15  Temp:      TempSrc:      SpO2: 92% 95% 93% 96%   No intake or output data in the 24 hours ending 05/12/21 1644 Last 3 Weights 03/12/2021 02/02/2021 08/16/2020  Weight (lbs) 260 lb 3.2 oz 251 lb 254 lb  Weight (kg) 118.026 kg 113.853 kg 115.214 kg     There is no height or weight on file to calculate BMI.  General:  Obese male well developed, in no acute distress HEENT: normal Neck: no JVD but thick neck Vascular: No carotid bruits; Distal pulses 2+ bilaterally   Cardiac:  normal S1, S2; RRR; no murmur  Lungs:  clear to auscultation bilaterally but decreased air movement bilaterally Abd: soft, nontenderly distended Ext: no edema Musculoskeletal:  No deformities, BUE and BLE strength normal and equal Skin: warm and dry  Neuro:  CNs 2-12 intact, no focal abnormalities noted Psych:  Normal affect    EKG:  The ECG that was done  was personally reviewed and demonstrates sinus bradycardia RBBB unchanged from prior no telemetry presently  Relevant CV Studies:   Left/Right Heart Catheterizations: Results: 08/07/11 cutting balloon PTCA to 95% ostial diag. and 90%  proximal LCX.  prior cutting balloon and to RCA  & ostium of ostial diag; Last Cath 02/2012 - Patent Diag PTCA, RCA & Circ BMS stents.  Laboratory Data:  High Sensitivity Troponin:   Recent Labs  Lab 05/12/21 1041 05/12/21 1235  TROPONINIHS 40* 40*      Chemistry Recent Labs  Lab 05/12/21 1041  NA 142  K 3.8  CL 104  CO2 28  GLUCOSE 126*  BUN 19  CREATININE 1.71*  CALCIUM 9.3  GFRNONAA 41*  ANIONGAP 10    Recent Labs  Lab 05/12/21 1041  PROT 6.5  ALBUMIN 3.9  AST 18  ALT 18  ALKPHOS 65  BILITOT 1.2   Lipids No results for input(s): CHOL, TRIG, HDL, LABVLDL, LDLCALC, CHOLHDL in the last 168  hours. Hematology Recent Labs  Lab 05/12/21 1041  WBC 6.8  RBC 4.35  HGB 13.4  HCT 41.9  MCV 96.3  MCH 30.8  MCHC 32.0  RDW 13.0  PLT 135*   Thyroid No results for input(s): TSH, FREET4 in the last 168 hours. BNPNo results for input(s): BNP, PROBNP in the last 168 hours.  DDimer  Recent Labs  Lab 05/12/21 1450  DDIMER >20.00*     Radiology/Studies:  DG Chest 2 View  Result Date: 05/12/2021 CLINICAL DATA:  Chest pain EXAM: CHEST - 2 VIEW COMPARISON:  Chest radiograph 11/27/2020 FINDINGS: The left chest wall port is in stable position. The cardiomediastinal silhouette is stable. There is unchanged asymmetric elevation of the right hemidiaphragm. Linear opacities projecting over the right base likely reflect atelectasis and/or scar. There is no focal consolidation. There is no pulmonary edema. There is no pleural effusion or pneumothorax. There is no acute osseous abnormality. IMPRESSION: Right basilar atelectasis and/or scar. Otherwise, no radiographic evidence of acute cardiopulmonary process. Electronically Signed   By: Valetta Mole M.D.   On: 05/12/2021 11:22   US Abdomen Limited RUQ (LIVER/GB)  Result Date: 05/12/2021 CLINICAL DATA:  Right upper quadrant pain EXAM: ULTRASOUND ABDOMEN LIMITED RIGHT UPPER QUADRANT COMPARISON:  None. FINDINGS: Gallbladder: No gallstones or wall thickening visualized. No sonographic Murphy sign noted by sonographer. Common bile duct: Diameter: 0.5 cm, within normal limits Liver: Multiple cystic masses noted in the left hepatic lobe, as seen on prior CT. The largest measures 3.8 x 2.9 x 4.8 cm. No suspicious solid mass. Within normal limits in parenchymal echogenicity. Portal vein is patent on color Doppler imaging with normal direction of blood flow towards the liver. Other: None. IMPRESSION: No sonographic finding to explain the patient's acute right upper quadrant pain. Electronically Signed   By: Audie Pinto M.D.   On: 05/12/2021 16:40      Assessment and Plan:   CAD s/p PCI in 2013 [place])  HTN with DM, HLD Chest pain in the setting of AKI Covid-19  - will hold aldactone and lisinopril; hold lasix 20 mg PO daily - continue Zocor and Zetia; outpatient lipid clinic referral for PCKS9i reasonable (LDL 90s) - will increase Imdur to 60 mg PO daily - will get echocardiogram - ideally conservative medical management until resolution of AKI; then outpatient NM stress - D dimer pending; would favor V/Q scan if elevated (has new CP after a prolonged 16 hour drive from [place]) - also getting US Abdomen (pending) -  presently no barriers to remdesivir if appropriate per primary  We will continue to follow   For questions or updates, please contact [place] Please consult [email or web address] for contact info under     Signed, Werner Lean, MD  05/12/2021 4:44 PM

## 2021-05-12 NOTE — ED Provider Notes (Signed)
6:03 PM patient signed out to me at shift change by [facility].   Patient with chest pain, plan for admission.  He was seen by cardiology.  Discussed with Dr. Rogers Blocker for admission.   He has acute kidney injury, COVID-19 infection with elevated D-dimer greater than 20.  Heparin was started.  VQ scan was advised by cardiology given AKI.  They plan nuclear medicine stress test when kidney function recovers.  BP (!) 177/76   Pulse 70   Temp 98.8 F (37.1 C) (Oral)   Resp (!) 25   SpO2 94%     Carlisle Cater, PA-C 05/12/21 1805    Charlesetta Shanks, MD 05/14/21 1554

## 2021-05-12 NOTE — ED Notes (Addendum)
87% RA, placed on 2L [place]

## 2021-05-12 NOTE — ED Triage Notes (Signed)
Pt to triage via [facility] from home.  Pt flew to [place] last week and road back in car (17 hrs) returning home Wednesday.  Reports "fullness" in chest for several weeks.  Chest pressure and SOB with exertion over the past few days.  Also reports R sided flank pain and R abd pain.  Nausea and vomiting Wednesday night and last night.  Denies coughing other than on the 2 nights he was vomiting he said he was coughing and gagging.  History of back pain and states back pain is worse.  20g RAC.  BP 237/ and HR 40s per EMS.

## 2021-05-12 NOTE — Progress Notes (Signed)
ANTICOAGULATION CONSULT NOTE - Initial Consult  Pharmacy Consult for heparin Indication: VTE/PE rule out, D-dimer >20 in setting of COVID infection  Allergies  Allergen Reactions   Rosuvastatin Calcium Other (See Comments)    Myalgias, tired crestor -aches    Patient Measurements:   Heparin Dosing Weight: 90kg  Vital Signs: Temp: 98.8 F (37.1 C) (10/15 1031) Temp Source: Oral (10/15 1031) BP: 163/72 (10/15 1530) Pulse Rate: 46 (10/15 1530)  Labs: Recent Labs    05/12/21 1041 05/12/21 1235  HGB 13.4  --   HCT 41.9  --   PLT 135*  --   CREATININE 1.71*  --   TROPONINIHS 40* 40*    CrCl cannot be calculated (Unknown ideal weight.).   Medical History: Past Medical History:  Diagnosis Date   Allergic rhinitis    Aortic valve sclerosis    Cause of murmur   Arthritis of knee, left, needs total Knee in future with Dr. Wynelle Link  08/07/2011   Bladder cancer ([place]) 11/2005   CAD S/P percutaneous coronary angioplasty 08/07/2011   NEW 08/07/11 cutting balloon PTCA to 95% ostial diag. and 90%  proximal LCX.  prior cutting balloon and to RCA  & ostium of ostial diag; Last Cath 02/2012 - Patent Diag PTCA, RCA & Circ BMS stents. Normal EF & EDP; Myoview 2/14 no evidence of ischemia or infarction   Diabetes mellitus type 2, controlled, with complications ([place])    Diabetes mellitus without complication ([place])    Dyslipidemia    History of heart attack 11/2007   "mild; did not affect the heart muscle"; PCI to RCA, Cutting PTCA Diag ostium   Hypertension    Macular degeneration    Macular degeneration of right eye    Now getting shots.    Melanoma of skin ([place]) ~ 2005   "level 2; on my back"   Myocardial infarction [facility]) 2012   Osteoarthritis    Status post right knee arthroplasty, positive for staph infection. Pending right arthroplasty   Portacath in place 08/11/2012   Statin intolerance 08/07/2011   Stroke ([place]) 1980's   "had facial strokes; 2; about a year apart; never did find what  caused them"    Assessment: 66 YOM presenting with CP and SOB, COVID +, elevated d-dimer >20.  He is not on anticoagulation PTA  Goal of Therapy:  Heparin level 0.3-0.7 units/ml Monitor platelets by anticoagulation protocol: Yes   Plan:  Heparin 5000 units IV x 1, and gtt at 1450 units/hr F/u heparin level in 8 hours F/u VTE/PE workup  Bertis Ruddy, PharmD Clinical Pharmacist ED Pharmacist Phone # [phone] 05/12/2021 5:09 PM

## 2021-05-12 NOTE — ED Notes (Signed)
RN attempted to call report to 6E x1.

## 2021-05-12 NOTE — ED Provider Notes (Signed)
[facility] EMERGENCY DEPARTMENT Provider Note   CSN: [id number] Arrival date & time: 05/12/21  1026     History Chief Complaint  Patient presents with   Chest Pain    Arthur Lutz is a 80 y.o. male.  HPI Patient is a 80 year old male with past medical history significant for aortic valve sclerosis with murmur, with multiple catheterizations and stent placements, DM2, HLD, HTN, MI, stroke.   Patient is presented to the emergency room today with chest pain/chest pressure he states that has been intermittent ongoing for 2 months but states it has been worse over the past couple days.  Endorses some nausea flank and generalized abdominal pain.   He has had normal bowel movements no nausea vomiting.  Denies any pleuritic pain.  Denies any unilateral leg swelling.  Has not taken his blood pressure medications this morning.  States over the past 3 days he has had some pressure in his chest.  He also just had some shortness of breath with this.  He states that he vomited multiple times nonbloody nonbilious emesis yesterday night as well as multiple times Wednesday night.  Has not had any vomiting during the day.  Denies any diarrhea apart from just a couple episodes on Wednesday he had some loose stool.      Past Medical History:  Diagnosis Date   Allergic rhinitis    Aortic valve sclerosis    Cause of murmur   Arthritis of knee, left, needs total Knee in future with Dr. Wynelle Link  08/07/2011   Bladder cancer ([place]) 11/2005   CAD S/P percutaneous coronary angioplasty 08/07/2011   NEW 08/07/11 cutting balloon PTCA to 95% ostial diag. and 90%  proximal LCX.  prior cutting balloon and to RCA  & ostium of ostial diag; Last Cath 02/2012 - Patent Diag PTCA, RCA & Circ BMS stents. Normal EF & EDP; Myoview 2/14 no evidence of ischemia or infarction   Diabetes mellitus type 2, controlled, with complications ([place])    Diabetes mellitus without complication ([place])    Dyslipidemia     History of heart attack 11/2007   "mild; did not affect the heart muscle"; PCI to RCA, Cutting PTCA Diag ostium   Hypertension    Macular degeneration    Macular degeneration of right eye    Now getting shots.    Melanoma of skin ([place]) ~ 2005   "level 2; on my back"   Myocardial infarction [facility]) 2012   Osteoarthritis    Status post right knee arthroplasty, positive for staph infection. Pending right arthroplasty   Portacath in place 08/11/2012   Statin intolerance 08/07/2011   Stroke [facility]) 1980's   "had facial strokes; 2; about a year apart; never did find what caused them"    Patient Active Problem List   Diagnosis Date Noted   Psoriasis 04/23/2019   Colitis 04/06/2019   Diarrhea 04/06/2019   Abdominal pain 03/29/2019   Seasonal and perennial allergic rhinitis 01/06/2019   Sinus bradycardia 08/29/2017   Chronic stable angina (HCC) - negative Myoview & no new Dz on Cath 05/23/2015   Insomnia due to stress 07/04/2014    Class: Chronic   Venous stasis of both lower extremities 11/24/2013   Abnormal biliary HIDA scan 09/15/2013   Intestinal bacterial overgrowth 08/16/2013   Bloating 08/16/2013   Obesity (BMI 30.0-34.9) 01/30/2013   Stiffness of joint, not elsewhere classified, lower leg 10/26/2012   Weakness of left leg 10/26/2012   Difficulty in walking 10/26/2012  Postoperative anemia due to acute blood loss 09/29/2012   OA (osteoarthritis) of knee 09/28/2012   Portacath in place 08/11/2012   CAD S/P percutaneous coronary angioplasty: 08/07/11 cutting balloon PTCA to 95% ostial diag. and 90%  proximal LCX.  prior cutting balloon to RCA  & ostium of ostial diag., 2009  08/07/2011   Essential hypertension 08/07/2011   DOE (dyspnea on exertion) 08/07/2011   Diabetes mellitus type 2, controlled, with complications ([place]) 81/19/1478   Hyperlipidemia associated with type 2 diabetes mellitus ([place]) 08/07/2011   Arthritis of knee, left, needs total Knee in future with Dr. Wynelle Link   08/07/2011   Statin myopathy 08/07/2011    Past Surgical History:  Procedure Laterality Date   BIOPSY  05/28/2019   Procedure: BIOPSY;  Surgeon: Rogene Houston, MD;  Location: [facility] ENDO [facility];  Service: Endoscopy;;  cecum   CARDIAC CATHETERIZATION  August '13   Widely patent RCA and LCx stents. Also patent PTCA site to D1, ~40-50% D2. Normal EF, normal EDP   CATARACT EXTRACTION W/ INTRAOCULAR LENS IMPLANT  ~ 2010   right   CATARACT EXTRACTION W/PHACO Left 11/21/2016   Procedure: CATARACT EXTRACTION PHACO AND INTRAOCULAR LENS PLACEMENT (IOC) CDE - 11.70 ;  Surgeon: Tonny Branch, MD;  Location: [facility] ORS;  Service: Ophthalmology;  Laterality: Left;  left   cold cup removal bladder lesion  11/2005   "malignant"   COLONOSCOPY N/A 12/09/2014   Procedure: COLONOSCOPY;  Surgeon: Rogene Houston, MD;  Location: [facility];  Service: Endoscopy;  Laterality: N/A;  955 - moved to 8:30 - Ann to notify pt   COLONOSCOPY WITH PROPOFOL N/A 05/28/2019   Procedure: COLONOSCOPY WITH PROPOFOL;  Surgeon: Rogene Houston, MD;  Location: [facility];  Service: Endoscopy;  Laterality: N/A;   CORONARY ANGIOPLASTY WITH STENT PLACEMENT  11/2007   Mid RCA - Driver BMS 3.5 mm x 15 mm; 2.0 mm Cutting Balloon PTCA - ostial D1   CORONARY ANGIOPLASTY WITH STENT PLACEMENT  08/07/11   Abnormal TM Myoview - for exertional throat discomfort: Patent RCA stent, 90% circumflex -- Vision BMS 3.5 mm x 12 mm --> 4.2 mm. Ostial D2 95% -- 2.25 mm Cutting Balloon PTCA   DOPPLER ECHOCARDIOGRAPHY  May 2009   Normal EF, impaired relaxation, with sclerotic aortic valve. No stenosis.   INGUINAL HERNIA REPAIR  ~ 1970   left   KNEE ARTHROSCOPY  02/20/11   left   LEFT HEART CATHETERIZATION WITH CORONARY ANGIOGRAM N/A 08/07/2011   Procedure: LEFT HEART CATHETERIZATION WITH CORONARY ANGIOGRAM;  Surgeon: Leonie Man, MD;  Location: [facility] CATH LAB;  Service: Cardiovascular;  Laterality: N/A;   LEFT HEART CATHETERIZATION WITH CORONARY ANGIOGRAM N/A  03/06/2012   Procedure: LEFT HEART CATHETERIZATION WITH CORONARY ANGIOGRAM;  Surgeon: Leonie Man, MD;  Location: [facility] CATH LAB;  Service: Cardiovascular;  Laterality: N/A;   NM MYOVIEW LTD  Jan. 31, 2014   Low risk, EF 49%, improved from prior. Small apical and apical septal defect, fixed likely artifact no skin or infarction.   PARTIAL KNEE ARTHROPLASTY  06/2003   right   PERCUTANEOUS CORONARY STENT INTERVENTION (PCI-S)  08/07/2011   Procedure: PERCUTANEOUS CORONARY STENT INTERVENTION (PCI-S);  Surgeon: Leonie Man, MD;  Location: [facility] CATH LAB;  Service: Cardiovascular;;   POLYPECTOMY  05/28/2019   Procedure: POLYPECTOMY;  Surgeon: Rogene Houston, MD;  Location: [facility];  Service: Endoscopy;;  colon   TONSILLECTOMY     "as a child"  TOTAL KNEE ARTHROPLASTY  11/2003   right x2 and one on left- Total of 3.   TOTAL KNEE ARTHROPLASTY Left 09/28/2012   Procedure: TOTAL KNEE ARTHROPLASTY;  Surgeon: Gearlean Alf, MD;  Location: [facility] ORS;  Service: Orthopedics;  Laterality: Left;       Family History  Problem Relation Age of Onset   Emphysema Father    Lung cancer Paternal Uncle        x 2 smokers   Colon cancer Neg Hx     Social History   Tobacco Use   Smoking status: Former    Packs/day: 2.00    Years: 28.00    Pack years: 56.00    Types: Cigarettes    Quit date: 07/29/1980    Years since quitting: 40.8   Smokeless tobacco: Never  Vaping Use   Vaping Use: Never used  Substance Use Topics   Alcohol use: Yes    Alcohol/week: 2.0 standard drinks    Types: 1 Glasses of wine, 1 Standard drinks or equivalent per week    Comment:  "wine or whiskey" occasionally   Drug use: No    Home Medications Prior to Admission medications   Medication Sig Start Date End Date Taking? Authorizing Provider  acetaminophen (TYLENOL) 650 MG CR tablet Take 650 mg by mouth every 8 (eight) hours as needed for pain.    [provider]  amLODipine (NORVASC) 10 MG tablet TAKE 1  TABLET(10 MG) BY MOUTH DAILY Patient taking differently: 5 mg. 02/15/21   Leonie Man, MD  Ascorbic Acid (VITAMIN C) 1000 MG tablet Take 1,000 mg by mouth daily.    [provider]  azelastine (ASTELIN) 0.1 % nasal spray USE 2 SPRAYS IN EACH NOSTRIL TWICE DAILY Patient taking differently: 2 (two) times daily as needed. 09/28/20   [name], FNP  budesonide (PULMICORT) 0.5 MG/2ML nebulizer solution FLUSH 2 ML MIXED WITH NASAL SALINE INTO THE NASAL PASSAGES DAILY AS DIRECTED. 10/27/19   Valentina Shaggy, MD  cetirizine (ZYRTEC ALLERGY) 10 MG tablet Take 1 tablet (10 mg total) by mouth daily. Patient taking differently: Take 10 mg by mouth as needed. 08/16/20   [name], FNP  Cholecalciferol (VITAMIN D) 50 MCG (2000 UT) tablet Take 2,000 Units by mouth daily.    [provider]  clobetasol (TEMOVATE) 0.05 % external solution Apply 1 application topically daily as needed (irritation).  03/01/19   [provider]  Coenzyme Q10 (CO Q-10) 100 MG CAPS Take 300 capsules by mouth 3 (three) times daily. Patient taking differently: Take 100 capsules by mouth daily. 08/29/17   Leonie Man, MD  cyclobenzaprine (FLEXERIL) 10 MG tablet Take 1 tablet (10 mg total) by mouth 2 (two) times daily as needed for muscle spasms. 03/30/19   Maudie Flakes, MD  EPINEPHrine 0.3 mg/0.3 mL IJ SOAJ injection Inject 0.3 mg into the muscle as needed for anaphylaxis.  12/25/18   [provider]  ezetimibe (ZETIA) 10 MG tablet Take 1 tablet (10 mg total) by mouth daily. 04/09/21 07/08/21  Leonie Man, MD  Fluticasone Propionate Truett Perna) 93 MCG/ACT [place] 2 application into the nose 2 (two) times daily. Patient taking differently: Place 2 application into the nose as needed. 09/08/19   Valentina Shaggy, MD  furosemide (LASIX) 20 MG tablet Take 1 tablet (20 mg total) by mouth as needed. for worsening swelling -- (take instead of HCTZ). 02/02/21 05/03/21  Leonie Man, MD   indomethacin (INDOCIN) 50  MG capsule Take 50 mg by mouth 2 (two) times daily as needed. 03/27/20   [provider]  isosorbide mononitrate (IMDUR) 30 MG 24 hr tablet TAKE 1 TABLET(30 MG) BY MOUTH DAILY 02/15/21   Leonie Man, MD  lisinopril (ZESTRIL) 40 MG tablet TAKE 1/2 TABLET BY MOUTH DAILY 04/30/21   Leonie Man, MD  loperamide (IMODIUM) 2 MG capsule Take 1 capsule (2 mg total) by mouth 4 (four) times daily as needed for diarrhea or loose stools. 03/30/19   Maudie Flakes, MD  LOTEMAX 0.5 % OINT Apply 1 application to eye as needed. 02/15/21   [provider]  magnesium gluconate (MAGONATE) 500 MG tablet Take 500 mg by mouth daily.    [provider]  meloxicam (MOBIC) 15 MG tablet Take 15 mg by mouth daily. 02/22/21   [provider]  metFORMIN (GLUCOPHAGE) 500 MG tablet Take 500 mg by mouth daily with breakfast.     [provider]  Multiple Vitamins-Minerals (PRESERVISION AREDS PO) Take 1 capsule by mouth daily.    [provider]  prednisoLONE acetate (PRED FORTE) 1 % ophthalmic suspension Place 1 drop into both eyes 2 (two) times daily. 03/08/21   [provider]  predniSONE (DELTASONE) 10 MG tablet Take 1 tablet (10 mg total) by mouth daily as needed (arthritis). 3,3, 2, 1 tabs 10/08/19   Leonie Man, MD  simvastatin (ZOCOR) 20 MG tablet Take 1 tablet (20 mg total) by mouth at bedtime. 04/09/21 07/08/21  Leonie Man, MD  spironolactone (ALDACTONE) 25 MG tablet Take 1 tablet (25 mg total) by mouth daily. 05/02/21 07/31/21  Leonie Man, MD  tacrolimus (PROTOPIC) 0.1 % ointment Apply topically 2 (two) times daily. 09/15/19   Valentina Shaggy, MD  tobramycin (TOBREX) 0.3 % ophthalmic solution Place 1 drop into both eyes as needed. 02/20/21   [provider]  triazolam (HALCION) 0.125 MG tablet Take 1.5 tablets (0.1875 mg total) by mouth at bedtime. Pt takes 1.5 tabs at bedtime Patient taking differently:  0.125 mg. Take 2 tablets at bedtime by mouth 08/29/17   Leonie Man, MD    Allergies    Rosuvastatin calcium  Review of Systems   Review of Systems  Constitutional:  Negative for chills and fever.  HENT:  Negative for congestion.   Eyes:  Negative for pain.  Respiratory:  Positive for shortness of breath. Negative for cough.   Cardiovascular:  Positive for chest pain. Negative for leg swelling.  Gastrointestinal:  Negative for abdominal pain and vomiting.  Genitourinary:  Negative for dysuria.  Musculoskeletal:  Negative for myalgias.  Skin:  Negative for rash.  Neurological:  Negative for dizziness and headaches.   Physical Exam Updated Vital Signs BP (!) 163/72 (BP Location: Right Arm)   Pulse (!) 46   Temp 98.8 F (37.1 C) (Oral)   Resp 15   SpO2 96%   Physical Exam Vitals and nursing note reviewed.  Constitutional:      General: He is not in acute distress. HENT:     Head: Normocephalic and atraumatic.     Nose: Nose normal.     Mouth/Throat:     Mouth: Mucous membranes are moist.  Eyes:     General: No scleral icterus. Cardiovascular:     Rate and Rhythm: Regular rhythm. Bradycardia present.     Pulses: Normal pulses.     Heart sounds: Normal heart sounds.  Pulmonary:     Effort: Pulmonary effort  is normal. No respiratory distress.     Breath sounds: No wheezing.  Abdominal:     Palpations: Abdomen is soft.     Tenderness: There is no abdominal tenderness. There is no guarding or rebound.     Comments: Abd non-TTP  Musculoskeletal:     Cervical back: Normal range of motion.     Right lower leg: Edema present.     Left lower leg: Edema present.     Comments: Very mild/scant lower extremity edema symmetric  Skin:    General: Skin is warm and dry.     Capillary Refill: Capillary refill takes less than 2 seconds.  Neurological:     Mental Status: He is alert. Mental status is at baseline.  Psychiatric:        Mood and Affect: Mood normal.         Behavior: Behavior normal.    ED Results / Procedures / Treatments   Labs (all labs ordered are listed, but only abnormal results are displayed) Labs Reviewed  COMPREHENSIVE METABOLIC PANEL - Abnormal; Notable for the following components:      Result Value   Glucose, Bld 126 (*)    Creatinine, Ser 1.71 (*)    GFR, Estimated 41 (*)    All other components within normal limits  CBC WITH DIFFERENTIAL/PLATELET - Abnormal; Notable for the following components:   Platelets 135 (*)    All other components within normal limits  URINALYSIS, ROUTINE W REFLEX MICROSCOPIC - Abnormal; Notable for the following components:   APPearance HAZY (*)    pH 9.0 (*)    Glucose, UA 150 (*)    Ketones, ur 20 (*)    Protein, ur 100 (*)    All other components within normal limits  TROPONIN I (HIGH SENSITIVITY) - Abnormal; Notable for the following components:   Troponin I (High Sensitivity) 40 (*)    All other components within normal limits  TROPONIN I (HIGH SENSITIVITY) - Abnormal; Notable for the following components:   Troponin I (High Sensitivity) 40 (*)    All other components within normal limits  RESP PANEL BY RT-PCR (FLU A&B, COVID) ARPGX2  LIPASE, BLOOD  BRAIN NATRIURETIC PEPTIDE  D-DIMER, QUANTITATIVE    EKG EKG Interpretation  Date/Time:  Saturday May 12 2021 13:12:43 EDT Ventricular Rate:  54 PR Interval:  172 QRS Duration: 156 QT Interval:  491 QTC Calculation: 466 R Axis:   -2 Text Interpretation: Sinus rhythm Right bundle branch block Confirmed by Pattricia Boss [phone]) on 05/12/2021 3:09:43 PM  Radiology DG Chest 2 View  Result Date: 05/12/2021 CLINICAL DATA:  Chest pain EXAM: CHEST - 2 VIEW COMPARISON:  Chest radiograph 11/27/2020 FINDINGS: The left chest wall port is in stable position. The cardiomediastinal silhouette is stable. There is unchanged asymmetric elevation of the right hemidiaphragm. Linear opacities projecting over the right base likely reflect  atelectasis and/or scar. There is no focal consolidation. There is no pulmonary edema. There is no pleural effusion or pneumothorax. There is no acute osseous abnormality. IMPRESSION: Right basilar atelectasis and/or scar. Otherwise, no radiographic evidence of acute cardiopulmonary process. Electronically Signed   By: Valetta Mole M.D.   On: 05/12/2021 11:22    Procedures Procedures   Medications Ordered in ED Medications  nitroGLYCERIN (NITROSTAT) SL tablet 0.4 mg (0.4 mg Sublingual Given 05/12/21 1431)  acetaminophen (TYLENOL) tablet 650 mg (650 mg Oral Given 05/12/21 1420)    ED Course  I have reviewed the triage vital signs and the  nursing notes.  Pertinent labs & imaging results that were available during my care of the patient were reviewed by me and considered in my medical decision making (see chart for details).  Clinical Course as of 05/12/21 1556  Sat May 12, 2021  1246 Pressure 2-3 days Right back/flank pain last night.  Vomiting throughout the night Wednesday and Friday. Diarrhea Wednesday.  [WF]  1259 Sunday/Tuesday - 7.5 hours / 10 hours at a time Last week. [WF]  5027 Discussed with  Chandrasekhar of cardiology who will see.  [WF]    Clinical Course User Index [WF] Tedd Sias, [place]   MDM Rules/Calculators/A&P                           Patient is a 80 year old male Presented today with multiple symptoms seems that had some nausea vomiting and diarrhea for the past 3 days also seems to be having some chest pain and shortness of breath he has an extensive cardiac history  Will obtain labs, EKG, chest x-ray.  Patient provided 1 dose of nitroglycerin which seems to have improved his chest pain and short of breath symptoms.  Initial troponin elevated at 40.  Patient states his symptoms are dramatically improved.  CMP notable for elevated creatinine of 1.71 approximately doubled since 1 month ago.  Otherwise normal chemistry.  CBC for leukocytosis or anemia.  Mild  thrombocytopenia.  Urinalysis with some protein and ketones otherwise unremarkable.  No evidence of infection.  BNP D-dimer and COVID influenza pending at this time.  Patient with history of biliary sludge.  Will obtain right upper quadrant ultrasound to ensure the patient does not have any evidence of cholecystitis however this seems unlikely given exam.  Chest x-ray without evidence of infiltrate perhaps some atelectasis.  Repeat EKG without significant changes.  I discussed this case with my attending physician who cosigned this note including patient's presenting symptoms, physical exam, and planned diagnostics and interventions. Attending physician stated agreement with plan or made changes to plan which were implemented.   Attending physician assessed patient at bedside.   Patient ultimately require admission for AKI. Patient care handed off to J Geiple to follow-up on right upper quadrant ultrasound, second troponin, D-dimer, COVID influenza and cardiology recommendations.  See clinical course for discussion with cardiology.    Final Clinical Impression(s) / ED Diagnoses Final diagnoses:  RUQ abdominal pain    Rx / DC Orders ED Discharge Orders     None        Tedd Sias, [place] 05/12/21 1558    Pattricia Boss, MD 05/18/21 [phone]

## 2021-05-12 NOTE — H&P (Signed)
History and Physical    Arthur Lutz POE:[id number] DOB: 1941/06/12 DOA: 05/12/2021  PCP: Redmond School, MD Consultants:  urology: Dr. Davis/Dr. Abner Greenspan, cardiology: Dr. Ellyn Hack  Opthalmology: dr. Posey Pronto  Patient coming from:  Home - lives with his wife   Chief Complaint: chest pain   HPI: Arthur Lutz is a 80 y.o. male with medical history significant of CAD status post percutaneous coronary angioplasty 2013, hypertension, type 2 diabetes, hyperlipidemia, venous stasis, presenting to ED with chest pain. He states he threw up all night long every 10-15 minutes last night. Had one episode of diarrhea and vomiting earlier in week (Wednesday). He started to have a fullness feeling all through his chest with the vomiting, but has had this feeling for the past 2-3 weeks, it just got worse. No radiation of the pain down the arm or up the jaw. No diaphoresis, shortness of breath. Denies any coughing, palpitations.   Hx of HIDA scan a few months ago 10/2020 and was told his gallbladder doesn't work well. (Gallbladder EF of 29%). Denies any RUQ pain, but abdomen feels swollen and gassy.   No fever/chills, +headache right now, no palpitations, no shortness of breath, no cough, no stomach pain, no nausea/diarrhea. No vomiting since yesterday.   Drove back from [place] Monday and Tuesday. Flew out there.  Does not smoke. Never had a blood clot, no family hx of clotting disorder. No recent surgeries.   ED Course: vitals: Afebrile, blood pressure 218/84, heart rate 64, respiratory rate 16, oxygen 95% on room air. Pertinent labs: Creatinine 1.71, troponin 40>40, COVID-positive, d-dimer >20, chest x-ray: Right basilar atelectasis and/or scar no other acute process.  Right upper quadrant ultrasound shows multiple cystic masses seen on previous CT no suspicious solid mass.  No gallstones. In ED cardiology was consulted, patient started on Imdur and heparin drip.  TRH was asked to admit.  Review of Systems:  As per HPI; otherwise review of systems reviewed and negative.   Ambulatory Status:   Ambulates without assistance   Past Medical History:  Diagnosis Date   Allergic rhinitis    Aortic valve sclerosis    Cause of murmur   Arthritis of knee, left, needs total Knee in future with Dr. Wynelle Link  08/07/2011   Bladder cancer ([place]) 11/2005   CAD S/P percutaneous coronary angioplasty 08/07/2011   NEW 08/07/11 cutting balloon PTCA to 95% ostial diag. and 90%  proximal LCX.  prior cutting balloon and to RCA  & ostium of ostial diag; Last Cath 02/2012 - Patent Diag PTCA, RCA & Circ BMS stents. Normal EF & EDP; Myoview 2/14 no evidence of ischemia or infarction   Diabetes mellitus type 2, controlled, with complications ([place])    Diabetes mellitus without complication ([place])    Dyslipidemia    History of heart attack 11/2007   "mild; did not affect the heart muscle"; PCI to RCA, Cutting PTCA Diag ostium   Hypertension    Macular degeneration    Macular degeneration of right eye    Now getting shots.    Melanoma of skin ([place]) ~ 2005   "level 2; on my back"   Myocardial infarction [facility]) 2012   Osteoarthritis    Status post right knee arthroplasty, positive for staph infection. Pending right arthroplasty   Portacath in place 08/11/2012   Statin intolerance 08/07/2011   Stroke ([place]) 1980's   "had facial strokes; 2; about a year apart; never did find what caused them"    Past Surgical History:  Procedure Laterality Date   BIOPSY  05/28/2019   Procedure: BIOPSY;  Surgeon: Rogene Houston, MD;  [facility]: [facility];  Service: Endoscopy;;  cecum   CARDIAC CATHETERIZATION  August '13   Widely patent RCA and LCx stents. Also patent PTCA site to D1, ~40-50% D2. Normal EF, normal EDP   CATARACT EXTRACTION W/ INTRAOCULAR LENS IMPLANT  ~ 2010   right   CATARACT EXTRACTION W/PHACO Left 11/21/2016   Procedure: CATARACT EXTRACTION PHACO AND INTRAOCULAR LENS PLACEMENT (IOC) CDE - 11.70 ;  Surgeon: Tonny Branch, MD;   Location: [facility] ORS;  Service: Ophthalmology;  Laterality: Left;  left   cold cup removal bladder lesion  11/2005   "malignant"   COLONOSCOPY N/A 12/09/2014   Procedure: COLONOSCOPY;  Surgeon: Rogene Houston, MD;  [facility]: [facility];  Service: Endoscopy;  Laterality: N/A;  955 - moved to 8:30 - Ann to notify pt   COLONOSCOPY WITH PROPOFOL N/A 05/28/2019   Procedure: COLONOSCOPY WITH PROPOFOL;  Surgeon: Rogene Houston, MD;  [facility]: [facility];  Service: Endoscopy;  Laterality: N/A;   CORONARY ANGIOPLASTY WITH STENT PLACEMENT  11/2007   Mid RCA - Driver BMS 3.5 mm x 15 mm; 2.0 mm Cutting Balloon PTCA - ostial D1   CORONARY ANGIOPLASTY WITH STENT PLACEMENT  08/07/11   Abnormal TM Myoview - for exertional throat discomfort: Patent RCA stent, 90% circumflex -- Vision BMS 3.5 mm x 12 mm --> 4.2 mm. Ostial D2 95% -- 2.25 mm Cutting Balloon PTCA   DOPPLER ECHOCARDIOGRAPHY  May 2009   Normal EF, impaired relaxation, with sclerotic aortic valve. No stenosis.   INGUINAL HERNIA REPAIR  ~ 1970   left   KNEE ARTHROSCOPY  02/20/11   left   LEFT HEART CATHETERIZATION WITH CORONARY ANGIOGRAM N/A 08/07/2011   Procedure: LEFT HEART CATHETERIZATION WITH CORONARY ANGIOGRAM;  Surgeon: Leonie Man, MD;  Location: [facility] CATH LAB;  Service: Cardiovascular;  Laterality: N/A;   LEFT HEART CATHETERIZATION WITH CORONARY ANGIOGRAM N/A 03/06/2012   Procedure: LEFT HEART CATHETERIZATION WITH CORONARY ANGIOGRAM;  Surgeon: Leonie Man, MD;  Location: [facility] CATH LAB;  Service: Cardiovascular;  Laterality: N/A;   NM MYOVIEW LTD  Jan. 31, 2014   Low risk, EF 49%, improved from prior. Small apical and apical septal defect, fixed likely artifact no skin or infarction.   PARTIAL KNEE ARTHROPLASTY  06/2003   right   PERCUTANEOUS CORONARY STENT INTERVENTION (PCI-S)  08/07/2011   Procedure: PERCUTANEOUS CORONARY STENT INTERVENTION (PCI-S);  Surgeon: Leonie Man, MD;  Location: [facility] CATH LAB;  Service: Cardiovascular;;    POLYPECTOMY  05/28/2019   Procedure: POLYPECTOMY;  Surgeon: Rogene Houston, MD;  [facility];  Service: Endoscopy;;  colon   TONSILLECTOMY     "as a child"   TOTAL KNEE ARTHROPLASTY  11/2003   right x2 and one on left- Total of 3.   TOTAL KNEE ARTHROPLASTY Left 09/28/2012   Procedure: TOTAL KNEE ARTHROPLASTY;  Surgeon: Gearlean Alf, MD;  Location: [facility];  Service: Orthopedics;  Laterality: Left;    Social History   Socioeconomic History   Marital status: Married    Spouse name: Not on file   Number of children: Not on file   Years of education: Not on file   Highest education level: Not on file  Occupational History   Occupation: Physicist, medical    Employer: Building [employer]  Tobacco Use   Smoking status: Former  Packs/day: 2.00    Years: 28.00    Pack years: 56.00    Types: Cigarettes    Quit date: 07/29/1980    Years since quitting: 40.8   Smokeless tobacco: Never  Vaping Use   Vaping Use: Never used  Substance and Sexual Activity   Alcohol use: Yes    Alcohol/week: 2.0 standard drinks    Types: 1 Glasses of wine, 1 Standard drinks or equivalent per week    Comment:  "wine or whiskey" occasionally   Drug use: No   Sexual activity: Never  Other Topics Concern   Not on file  Social History Narrative   He is a married father of 2, grandfather of 40. He is not really getting a lot of exercise now. He previously had been working out on a treadmill at least 2 times a day for 15 minutes at a time, but he   is not able to do that as much now because his knee hurts him too bad. Does not smoke and only occasional alcoholic beverage.   Social Determinants of Health   Financial Resource Strain: Not on file  Food Insecurity: Not on file  Transportation Needs: Not on file  Physical Activity: Not on file  Stress: Not on file  Social Connections: Not on file  Intimate Partner Violence: Not on file    Allergies  Allergen Reactions   Aspirin  Other (See Comments)    Was told to not take this due to having macular degeneration   Rosuvastatin Calcium Other (See Comments)    Aches and cramps   Other Rash and Other (See Comments)    Red meat = develops a rash on the back    Family History  Problem Relation Age of Onset   Emphysema Father    Lung cancer Paternal Uncle        x 2 smokers   Colon cancer Neg Hx     Prior to Admission medications   Medication Sig Start Date End Date Taking? Authorizing Provider  acetaminophen (TYLENOL) 650 MG CR tablet Take 650 mg by mouth every 8 (eight) hours as needed for pain.   Yes [provider]  amLODipine (NORVASC) 10 MG tablet TAKE 1 TABLET(10 MG) BY MOUTH DAILY Patient taking differently: 5 mg. 02/15/21   Leonie Man, MD  Ascorbic Acid (VITAMIN C) 1000 MG tablet Take 1,000 mg by mouth daily.    [provider]  azelastine (ASTELIN) 0.1 % nasal spray USE 2 SPRAYS IN EACH NOSTRIL TWICE DAILY Patient taking differently: 2 (two) times daily as needed. 09/28/20   [name], FNP  budesonide (PULMICORT) 0.5 MG/2ML nebulizer solution FLUSH 2 ML MIXED WITH NASAL SALINE INTO THE NASAL PASSAGES DAILY AS DIRECTED. 10/27/19   Valentina Shaggy, MD  cetirizine (ZYRTEC ALLERGY) 10 MG tablet Take 1 tablet (10 mg total) by mouth daily. Patient taking differently: Take 10 mg by mouth as needed. 08/16/20   [name], FNP  Cholecalciferol (VITAMIN D) 50 MCG (2000 UT) tablet Take 2,000 Units by mouth daily.    [provider]  clobetasol (TEMOVATE) 0.05 % external solution Apply 1 application topically daily as needed (irritation).  03/01/19   [provider]  Coenzyme Q10 (CO Q-10) 100 MG CAPS Take 300 capsules by mouth 3 (three) times daily. Patient taking differently: Take 100 capsules by mouth daily. 08/29/17   Leonie Man, MD  cyclobenzaprine (FLEXERIL) 10 MG tablet Take 1 tablet (10 mg total) by mouth 2 (  two) times daily as needed for muscle spasms.  03/30/19   Maudie Flakes, MD  EPINEPHrine 0.3 mg/0.3 mL IJ SOAJ injection Inject 0.3 mg into the muscle as needed for anaphylaxis.  12/25/18   [provider]  ezetimibe (ZETIA) 10 MG tablet Take 1 tablet (10 mg total) by mouth daily. 04/09/21 07/08/21  Leonie Man, MD  Fluticasone Propionate Truett Perna) 93 MCG/ACT [place] 2 application into the nose 2 (two) times daily. Patient taking differently: Place 2 application into the nose as needed. 09/08/19   Valentina Shaggy, MD  furosemide (LASIX) 20 MG tablet Take 1 tablet (20 mg total) by mouth as needed. for worsening swelling -- (take instead of HCTZ). 02/02/21 05/03/21  Leonie Man, MD  indomethacin (INDOCIN) 50 MG capsule Take 50 mg by mouth 2 (two) times daily as needed. 03/27/20   [provider]  isosorbide mononitrate (IMDUR) 30 MG 24 hr tablet TAKE 1 TABLET(30 MG) BY MOUTH DAILY 02/15/21   Leonie Man, MD  lisinopril (ZESTRIL) 40 MG tablet TAKE 1/2 TABLET BY MOUTH DAILY 04/30/21   Leonie Man, MD  loperamide (IMODIUM) 2 MG capsule Take 1 capsule (2 mg total) by mouth 4 (four) times daily as needed for diarrhea or loose stools. 03/30/19   Maudie Flakes, MD  LOTEMAX 0.5 % OINT Apply 1 application to eye as needed. 02/15/21   [provider]  magnesium gluconate (MAGONATE) 500 MG tablet Take 500 mg by mouth daily.    [provider]  meloxicam (MOBIC) 15 MG tablet Take 15 mg by mouth daily. 02/22/21   [provider]  metFORMIN (GLUCOPHAGE) 500 MG tablet Take 500 mg by mouth daily with breakfast.     [provider]  Multiple Vitamins-Minerals (PRESERVISION AREDS PO) Take 1 capsule by mouth daily.    [provider]  prednisoLONE acetate (PRED FORTE) 1 % ophthalmic suspension Place 1 drop into both eyes 2 (two) times daily. 03/08/21   [provider]  predniSONE (DELTASONE) 10 MG tablet Take 1 tablet (10 mg total) by mouth daily as needed (arthritis). 3,3, 2, 1  tabs 10/08/19   Leonie Man, MD  simvastatin (ZOCOR) 20 MG tablet Take 1 tablet (20 mg total) by mouth at bedtime. 04/09/21 07/08/21  Leonie Man, MD  spironolactone (ALDACTONE) 25 MG tablet Take 1 tablet (25 mg total) by mouth daily. 05/02/21 07/31/21  Leonie Man, MD  tacrolimus (PROTOPIC) 0.1 % ointment Apply topically 2 (two) times daily. 09/15/19   Valentina Shaggy, MD  tobramycin (TOBREX) 0.3 % ophthalmic solution Place 1 drop into both eyes as needed. 02/20/21   [provider]  triazolam (HALCION) 0.125 MG tablet Take 1.5 tablets (0.1875 mg total) by mouth at bedtime. Pt takes 1.5 tabs at bedtime Patient taking differently: No sig reported 08/29/17   Leonie Man, MD    Physical Exam: Vitals:   05/12/21 1800 05/12/21 1930 05/12/21 2013 05/12/21 2015  BP: (!) 189/79 (!) 187/74  (!) 189/91  Pulse: (!) 45 (!) 58  (!) 57  Resp: 19 17  (!) 21  Temp:   98.5 F (36.9 C)   TempSrc:   Oral   SpO2: 93% 94%  92%     General:  Appears calm and comfortable and is in NAD Eyes:  PERRL, EOMI, normal lids, iris ENT:  grossly normal hearing, lips & tongue, mmm; appropriate dentition Neck:  no LAD, masses or thyromegaly; no carotid bruits Cardiovascular:  RRR, no m/r/g. No LE edema.  Respiratory:   CTA bilaterally with no wheezes/rales/rhonchi.  Normal respiratory effort. Abdomen:  soft, NT, protuberant, NABS. Negative murphy's sign Back:   normal alignment, no CVAT Skin:  no rash or induration seen on limited exam Musculoskeletal:  grossly normal tone BUE/BLE, good ROM, no bony abnormality Lower extremity:  No LE edema.  Limited foot exam with no ulcerations.  2+ distal pulses. Psychiatric:  grossly normal mood and affect, speech fluent and appropriate, AOx3 Neurologic:  CN 2-12 grossly intact, moves all extremities in coordinated fashion, sensation intact    Radiological Exams on Admission: Independently reviewed - see discussion in A/P where applicable  DG  Chest 2 View  Result Date: 05/12/2021 CLINICAL DATA:  Chest pain EXAM: CHEST - 2 VIEW COMPARISON:  Chest radiograph 11/27/2020 FINDINGS: The left chest wall port is in stable position. The cardiomediastinal silhouette is stable. There is unchanged asymmetric elevation of the right hemidiaphragm. Linear opacities projecting over the right base likely reflect atelectasis and/or scar. There is no focal consolidation. There is no pulmonary edema. There is no pleural effusion or pneumothorax. There is no acute osseous abnormality. IMPRESSION: Right basilar atelectasis and/or scar. Otherwise, no radiographic evidence of acute cardiopulmonary process. Electronically Signed   By: Valetta Mole M.D.   On: 05/12/2021 11:22   US Abdomen Limited RUQ (LIVER/GB)  Result Date: 05/12/2021 CLINICAL DATA:  Right upper quadrant pain EXAM: ULTRASOUND ABDOMEN LIMITED RIGHT UPPER QUADRANT COMPARISON:  None. FINDINGS: Gallbladder: No gallstones or wall thickening visualized. No sonographic Murphy sign noted by sonographer. Common bile duct: Diameter: 0.5 cm, within normal limits Liver: Multiple cystic masses noted in the left hepatic lobe, as seen on prior CT. The largest measures 3.8 x 2.9 x 4.8 cm. No suspicious solid mass. Within normal limits in parenchymal echogenicity. Portal vein is patent on color Doppler imaging with normal direction of blood flow towards the liver. Other: None. IMPRESSION: No sonographic finding to explain the patient's acute right upper quadrant pain. Electronically Signed   By: Audie Pinto M.D.   On: 05/12/2021 16:40    EKG: Independently reviewed.  Sinus bradycardia with rate 54, RBBB nonspecific ST changes with no evidence of acute ischemia. No change from previous ekg    Labs on Admission: I have personally reviewed the available labs and imaging studies at the time of the admission.  Pertinent labs:  Creatinine 1.71,  troponin 40>40,  COVID-positive,  d-dimer >20,    Assessment/Plan Principal Problem:   Chest pain -80 year old male presenting with chest pain in setting of COVID-19, intractable vomiting and AKI.  Known history of CAD -Admit to telemetry -Troponins have been flat -On heparin GTT for possible PE awaiting VQ scan -Cardiology consulted, echo ordered.  Daily conservative medical management until AKI resolves then possible outpatient NM stress -Imdur 60 mg p.o. daily -Continue his Zocor and Zetia, working with his cardiologist to get LDL to goal.Trialed nexlizet samples with no change in LDL   -does not appear to be on baby ASA   Active Problems:   AKI (acute kidney injury) ([place]) -Likely prerenal in setting of  vomiting, a few episodes of diarrhea and poor p.o. intake -IV fluid hydration with normal saline at 75 cc an hour -Hold nephrotoxic drugs including Aldactone, lisinopril and Lasix and metformin (WIFE states he is not on aldactone-looks like called in by cardiology on 04/30/21) -follow intake/output  -Daily BMP    Elevated d-dimer -Elevated D-dimer in setting of COVID-19 infection however  recent flight to [place] with return 16-hour drive this week. He has no tachycardia,  no shortness of breath however he states his oxygen runs low at home and has oxygen on- per nurse note, oxygen down to 87% on RA and placed on 2L [place].  -With AKI will order VQ scan to rule out PE -Continue heparin gtt     COVID-19 virus infection -question incidental versus GI symptoms. No respiratory symptoms, but had low oxygen saturation x 1 in ED. (Had covid this summer)  -airborne and contact precautions  -He is not vaccinated but had COVID this summer and received monoclonal ab infusion  -Declines oral treatment or remdesivir.  Is interested in monoclonal antibodies; however is not a candidate for this since he is a candidate for oral therapy.  -vitamins, SABA prn -covid labs pending/daily  -PE work up above.  -? If qualifies for steroids, I took him  off oxygen and oxygen maintained at 94% on RA. Has no shortness of breath, cough or findings on CXR. Will give one dose today and day team can continue this if requiring oxygen   Vomiting -Has not had any episodes of vomiting all day -Zofran as needed -Light IV fluids  Decreased gallbladder EF by HIDA in 10/2020 Declined cholecystectomy however if continues to have nausea/vomiting any right upper quadrant pain due to this, he understands this may need to be done Continue to monitor   Chronic sinus bradycardia Continue telemetry    CAD S/P percutaneous coronary angioplasty: 08/07/11 cutting balloon  PTCA to 95% ostial diag. and 90%  proximal LCX.  prior cutting balloon to RCA  & ostium of ostial diag., 2009  -Continue statin, Zetia -No beta blocker due to bradycardia -does not appear to be on ASA? Would clarify with wife  -Chest pain work-up per above    Essential hypertension/resistant  -Patient with elevated blood pressure however has not had home meds. Tells me he typically runs systolic 867.  -continue imdur -hctz stopped outpatient and had AE to norvasc. Also given coreg 3.125, but had bradycardia. Was given px for aldactone, but has not started this and I dont think the wife is aware they were supposed to start this  -Holding lisinopril  in setting of AKI - will do as needed hydralazine parameters. No AV nodal blocking agents with bradycardia.  -start spironolactone if AKI resolves.     Diabetes mellitus type 2, controlled, with complications ([place]) -Y1P this past month 6.7 -Hold metformin in inpatient setting and with AKI -Sliding scale insulin and Accu-Cheks per protocol    Hyperlipidemia associated with type 2 diabetes mellitus ([place]) -continue zetia/zocor. Working with his cardiologist to get LDL to goal.     Venous stasis of both lower extremities  Stable, lasix prn on hold due to AKI   There is no height or weight on file to calculate BMI.    Level of care: Telemetry  Cardiac DVT prophylaxis: heparin gtt  Code Status:  Full - confirmed with patient/family Family Communication: wife at bedside: carol Gladwell  Disposition Plan:  The patient is from: home  Anticipated d/c is to: home Requires inpatient hospitalization and is at significant risk of worsening, requires constant monitoring, assessment and MDM with specialists.  Patient is currently: stable  Consults called: cardiology   Admission status:  observation   Dragon dictation used in completing this note.    Orma Flaming MD [facility]   How to contact the [facility] Attending or Consulting provider [place]  or covering provider during after hours [place], for this patient?  Check the care team in [facility] and look for a) attending/consulting [facility] provider listed and b) the [facility] team listed Log into [email or web address] and use [place]'s universal password to access. If you do not have the password, please contact the hospital operator. Locate the [facility] provider you are looking for under [facility] and page to a number that you can be directly reached. If you still have difficulty reaching the provider, please page the [facility] (Director on Call) for the Hospitalists listed on amion for assistance.   05/12/2021, 8:53 PM

## 2021-05-13 ENCOUNTER — Observation Stay (HOSPITAL_COMMUNITY): Payer: Medicare Other

## 2021-05-13 ENCOUNTER — Inpatient Hospital Stay (HOSPITAL_COMMUNITY): Payer: Medicare Other

## 2021-05-13 DIAGNOSIS — U071 COVID-19: Secondary | ICD-10-CM | POA: Diagnosis present

## 2021-05-13 DIAGNOSIS — H3530 Unspecified macular degeneration: Secondary | ICD-10-CM | POA: Diagnosis present

## 2021-05-13 DIAGNOSIS — E860 Dehydration: Secondary | ICD-10-CM | POA: Diagnosis present

## 2021-05-13 DIAGNOSIS — R197 Diarrhea, unspecified: Secondary | ICD-10-CM | POA: Diagnosis present

## 2021-05-13 DIAGNOSIS — Z5320 Procedure and treatment not carried out because of patient's decision for unspecified reasons: Secondary | ICD-10-CM | POA: Diagnosis not present

## 2021-05-13 DIAGNOSIS — G9341 Metabolic encephalopathy: Secondary | ICD-10-CM | POA: Diagnosis present

## 2021-05-13 DIAGNOSIS — I10 Essential (primary) hypertension: Secondary | ICD-10-CM | POA: Diagnosis present

## 2021-05-13 DIAGNOSIS — I358 Other nonrheumatic aortic valve disorders: Secondary | ICD-10-CM | POA: Diagnosis present

## 2021-05-13 DIAGNOSIS — R7989 Other specified abnormal findings of blood chemistry: Secondary | ICD-10-CM | POA: Diagnosis not present

## 2021-05-13 DIAGNOSIS — E7800 Pure hypercholesterolemia, unspecified: Secondary | ICD-10-CM | POA: Diagnosis present

## 2021-05-13 DIAGNOSIS — R072 Precordial pain: Secondary | ICD-10-CM | POA: Diagnosis present

## 2021-05-13 DIAGNOSIS — Z9861 Coronary angioplasty status: Secondary | ICD-10-CM | POA: Diagnosis not present

## 2021-05-13 DIAGNOSIS — Z7984 Long term (current) use of oral hypoglycemic drugs: Secondary | ICD-10-CM | POA: Diagnosis not present

## 2021-05-13 DIAGNOSIS — I2510 Atherosclerotic heart disease of native coronary artery without angina pectoris: Secondary | ICD-10-CM | POA: Diagnosis present

## 2021-05-13 DIAGNOSIS — E669 Obesity, unspecified: Secondary | ICD-10-CM | POA: Diagnosis present

## 2021-05-13 DIAGNOSIS — R079 Chest pain, unspecified: Secondary | ICD-10-CM | POA: Diagnosis not present

## 2021-05-13 DIAGNOSIS — N179 Acute kidney failure, unspecified: Secondary | ICD-10-CM

## 2021-05-13 DIAGNOSIS — R1011 Right upper quadrant pain: Secondary | ICD-10-CM | POA: Diagnosis present

## 2021-05-13 DIAGNOSIS — A419 Sepsis, unspecified organism: Secondary | ICD-10-CM | POA: Diagnosis present

## 2021-05-13 DIAGNOSIS — E1169 Type 2 diabetes mellitus with other specified complication: Secondary | ICD-10-CM | POA: Diagnosis present

## 2021-05-13 DIAGNOSIS — Z6835 Body mass index (BMI) 35.0-35.9, adult: Secondary | ICD-10-CM | POA: Diagnosis not present

## 2021-05-13 DIAGNOSIS — J1282 Pneumonia due to coronavirus disease 2019: Secondary | ICD-10-CM | POA: Diagnosis present

## 2021-05-13 DIAGNOSIS — I252 Old myocardial infarction: Secondary | ICD-10-CM | POA: Diagnosis not present

## 2021-05-13 DIAGNOSIS — E1165 Type 2 diabetes mellitus with hyperglycemia: Secondary | ICD-10-CM | POA: Diagnosis present

## 2021-05-13 DIAGNOSIS — I878 Other specified disorders of veins: Secondary | ICD-10-CM | POA: Diagnosis present

## 2021-05-13 DIAGNOSIS — D6959 Other secondary thrombocytopenia: Secondary | ICD-10-CM | POA: Diagnosis present

## 2021-05-13 DIAGNOSIS — J9601 Acute respiratory failure with hypoxia: Secondary | ICD-10-CM | POA: Diagnosis present

## 2021-05-13 DIAGNOSIS — M1612 Unilateral primary osteoarthritis, left hip: Secondary | ICD-10-CM | POA: Diagnosis present

## 2021-05-13 DIAGNOSIS — E118 Type 2 diabetes mellitus with unspecified complications: Secondary | ICD-10-CM | POA: Diagnosis not present

## 2021-05-13 DIAGNOSIS — R609 Edema, unspecified: Secondary | ICD-10-CM | POA: Diagnosis not present

## 2021-05-13 LAB — CBC WITH DIFFERENTIAL/PLATELET
Abs Immature Granulocytes: 0.04 10*3/uL (ref 0.00–0.07)
Basophils Absolute: 0 10*3/uL (ref 0.0–0.1)
Basophils Relative: 0 %
Eosinophils Absolute: 0 10*3/uL (ref 0.0–0.5)
Eosinophils Relative: 0 %
HCT: 40.8 % (ref 39.0–52.0)
Hemoglobin: 13.3 g/dL (ref 13.0–17.0)
Immature Granulocytes: 0 %
Lymphocytes Relative: 3 %
Lymphs Abs: 0.3 10*3/uL — ABNORMAL LOW (ref 0.7–4.0)
MCH: 30.5 pg (ref 26.0–34.0)
MCHC: 32.6 g/dL (ref 30.0–36.0)
MCV: 93.6 fL (ref 80.0–100.0)
Monocytes Absolute: 0.7 10*3/uL (ref 0.1–1.0)
Monocytes Relative: 7 %
Neutro Abs: 9.7 10*3/uL — ABNORMAL HIGH (ref 1.7–7.7)
Neutrophils Relative %: 90 %
Platelets: 144 10*3/uL — ABNORMAL LOW (ref 150–400)
RBC: 4.36 MIL/uL (ref 4.22–5.81)
RDW: 12.8 % (ref 11.5–15.5)
WBC: 10.8 10*3/uL — ABNORMAL HIGH (ref 4.0–10.5)
nRBC: 0 % (ref 0.0–0.2)

## 2021-05-13 LAB — COMPREHENSIVE METABOLIC PANEL
ALT: 15 U/L (ref 0–44)
ALT: 15 U/L (ref 0–44)
AST: 13 U/L — ABNORMAL LOW (ref 15–41)
AST: 18 U/L (ref 15–41)
Albumin: 3.5 g/dL (ref 3.5–5.0)
Albumin: 3.7 g/dL (ref 3.5–5.0)
Alkaline Phosphatase: 57 U/L (ref 38–126)
Alkaline Phosphatase: 63 U/L (ref 38–126)
Anion gap: 9 (ref 5–15)
Anion gap: 12 (ref 5–15)
BUN: 20 mg/dL (ref 8–23)
BUN: 24 mg/dL — ABNORMAL HIGH (ref 8–23)
CO2: 27 mmol/L (ref 22–32)
CO2: 24 mmol/L (ref 22–32)
Calcium: 8.9 mg/dL (ref 8.9–10.3)
Calcium: 9.1 mg/dL (ref 8.9–10.3)
Chloride: 104 mmol/L (ref 98–111)
Chloride: 103 mmol/L (ref 98–111)
Creatinine, Ser: 1.63 mg/dL — ABNORMAL HIGH (ref 0.61–1.24)
Creatinine, Ser: 1.71 mg/dL — ABNORMAL HIGH (ref 0.61–1.24)
GFR, Estimated: 43 mL/min — ABNORMAL LOW (ref >60)
GFR, Estimated: 41 mL/min — ABNORMAL LOW (ref >60)
Glucose, Bld: 176 mg/dL — ABNORMAL HIGH (ref 70–99)
Glucose, Bld: 160 mg/dL — ABNORMAL HIGH (ref 70–99)
Potassium: 3.9 mmol/L (ref 3.5–5.1)
Potassium: 4.3 mmol/L (ref 3.5–5.1)
Sodium: 140 mmol/L (ref 135–145)
Sodium: 139 mmol/L (ref 135–145)
Total Bilirubin: 1.2 mg/dL (ref 0.3–1.2)
Total Bilirubin: 1.2 mg/dL (ref 0.3–1.2)
Total Protein: 5.8 g/dL — ABNORMAL LOW (ref 6.5–8.1)
Total Protein: 6.4 g/dL — ABNORMAL LOW (ref 6.5–8.1)

## 2021-05-13 LAB — CBC
HCT: 37.9 % — ABNORMAL LOW (ref 39.0–52.0)
Hemoglobin: 12.5 g/dL — ABNORMAL LOW (ref 13.0–17.0)
MCH: 31.3 pg (ref 26.0–34.0)
MCHC: 33 g/dL (ref 30.0–36.0)
MCV: 95 fL (ref 80.0–100.0)
Platelets: 124 10*3/uL — ABNORMAL LOW (ref 150–400)
RBC: 3.99 MIL/uL — ABNORMAL LOW (ref 4.22–5.81)
RDW: 13 % (ref 11.5–15.5)
WBC: 6.5 10*3/uL (ref 4.0–10.5)
nRBC: 0 % (ref 0.0–0.2)

## 2021-05-13 LAB — POCT I-STAT 7, (LYTES, BLD GAS, ICA,H+H)
Acid-Base Excess: 0 mmol/L (ref 0.0–2.0)
Bicarbonate: 25.8 mmol/L (ref 20.0–28.0)
Calcium, Ion: 1.24 mmol/L (ref 1.15–1.40)
HCT: 37 % — ABNORMAL LOW (ref 39.0–52.0)
Hemoglobin: 12.6 g/dL — ABNORMAL LOW (ref 13.0–17.0)
O2 Saturation: 93 %
Patient temperature: 101.4
Potassium: 3.5 mmol/L (ref 3.5–5.1)
Sodium: 140 mmol/L (ref 135–145)
TCO2: 27 mmol/L (ref 22–32)
pCO2 arterial: 47.1 mmHg (ref 32.0–48.0)
pH, Arterial: 7.354 (ref 7.350–7.450)
pO2, Arterial: 78 mmHg — ABNORMAL LOW (ref 83.0–108.0)

## 2021-05-13 LAB — STREP PNEUMONIAE URINARY ANTIGEN: Strep Pneumo Urinary Antigen: NEGATIVE

## 2021-05-13 LAB — HEMOGLOBIN A1C
Hgb A1c MFr Bld: 6.9 % — ABNORMAL HIGH (ref 4.8–5.6)
Mean Plasma Glucose: 151.33 mg/dL

## 2021-05-13 LAB — URINALYSIS, COMPLETE (UACMP) WITH MICROSCOPIC
Bacteria, UA: NONE SEEN
Bilirubin Urine: NEGATIVE
Glucose, UA: 50 mg/dL — AB
Hgb urine dipstick: NEGATIVE
Ketones, ur: 5 mg/dL — AB
Leukocytes,Ua: NEGATIVE
Nitrite: NEGATIVE
Protein, ur: 30 mg/dL — AB
Specific Gravity, Urine: 1.021 (ref 1.005–1.030)
pH: 5 (ref 5.0–8.0)

## 2021-05-13 LAB — PROTIME-INR
INR: 1.1 (ref 0.8–1.2)
Prothrombin Time: 14.5 seconds (ref 11.4–15.2)

## 2021-05-13 LAB — FERRITIN: Ferritin: 43 ng/mL (ref 24–336)

## 2021-05-13 LAB — D-DIMER, QUANTITATIVE: D-Dimer, Quant: >20 ug/mL-FEU — ABNORMAL HIGH (ref 0.00–0.50)

## 2021-05-13 LAB — LACTIC ACID, PLASMA
Lactic Acid, Venous: 2.1 mmol/L (ref 0.5–1.9)
Lactic Acid, Venous: 1.1 mmol/L (ref 0.5–1.9)

## 2021-05-13 LAB — PHOSPHORUS: Phosphorus: 3.4 mg/dL (ref 2.5–4.6)

## 2021-05-13 LAB — CK: Total CK: 35 U/L — ABNORMAL LOW (ref 49–397)

## 2021-05-13 LAB — HEPARIN LEVEL (UNFRACTIONATED)
Heparin Unfractionated: <0.1 IU/mL — ABNORMAL LOW (ref 0.30–0.70)
Heparin Unfractionated: 0.35 IU/mL (ref 0.30–0.70)
Heparin Unfractionated: 0.22 IU/mL — ABNORMAL LOW (ref 0.30–0.70)

## 2021-05-13 LAB — GLUCOSE, CAPILLARY
Glucose-Capillary: 211 mg/dL — ABNORMAL HIGH (ref 70–99)
Glucose-Capillary: 272 mg/dL — ABNORMAL HIGH (ref 70–99)
Glucose-Capillary: 163 mg/dL — ABNORMAL HIGH (ref 70–99)
Glucose-Capillary: 227 mg/dL — ABNORMAL HIGH (ref 70–99)

## 2021-05-13 LAB — PROCALCITONIN: Procalcitonin: <0.1 ng/mL

## 2021-05-13 LAB — C-REACTIVE PROTEIN: CRP: 2.2 mg/dL — ABNORMAL HIGH (ref <1.0)

## 2021-05-13 LAB — APTT: aPTT: 40 seconds — ABNORMAL HIGH (ref 24–36)

## 2021-05-13 LAB — MRSA NEXT GEN BY PCR, NASAL: MRSA by PCR Next Gen: NOT DETECTED

## 2021-05-13 LAB — MAGNESIUM: Magnesium: 2.1 mg/dL (ref 1.7–2.4)

## 2021-05-13 MED ORDER — FAMOTIDINE 20 MG PO TABS
20.0000 mg | ORAL_TABLET | Freq: Two times a day (BID) | ORAL | Status: DC
  Administered 2021-05-13 – 2021-05-16 (×6): 20 mg via ORAL
  Filled 2021-05-13 (×7): qty 1

## 2021-05-13 MED ORDER — VANCOMYCIN HCL 1750 MG/350ML IV SOLN: 1750.0000 mg | INTRAVENOUS | Status: DC

## 2021-05-13 MED ORDER — VANCOMYCIN HCL 1250 MG/250ML IV SOLN: 1250.0000 mg | INTRAVENOUS | Status: DC

## 2021-05-13 MED ORDER — CARVEDILOL 3.125 MG PO TABS
1.5625 mg | ORAL_TABLET | Freq: Every day | ORAL | Status: DC
  Administered 2021-05-13: 1.5625 mg via ORAL
  Filled 2021-05-13 (×3): qty 1

## 2021-05-13 MED ORDER — CHLORHEXIDINE GLUCONATE CLOTH 2 % EX PADS
6.0000 | MEDICATED_PAD | Freq: Every day | CUTANEOUS | Status: DC
  Administered 2021-05-13 – 2021-05-14 (×2): 6 via TOPICAL

## 2021-05-13 MED ORDER — ONDANSETRON HCL 4 MG/2ML IJ SOLN: 4.0000 mg | Freq: Four times a day (QID) | INTRAMUSCULAR | Status: DC | PRN

## 2021-05-13 MED ORDER — ONDANSETRON HCL 4 MG/2ML IJ SOLN
INTRAMUSCULAR | Status: AC
  Administered 2021-05-13: 4 mg
  Filled 2021-05-13: qty 2

## 2021-05-13 MED ORDER — METHYLPREDNISOLONE SODIUM SUCC 125 MG IJ SOLR
60.0000 mg | INTRAMUSCULAR | Status: DC
  Administered 2021-05-13 – 2021-05-15 (×3): 60 mg via INTRAVENOUS
  Filled 2021-05-13 (×3): qty 2

## 2021-05-13 MED ORDER — METRONIDAZOLE 500 MG/100ML IV SOLN
500.0000 mg | Freq: Two times a day (BID) | INTRAVENOUS | Status: DC
  Administered 2021-05-13 – 2021-05-15 (×5): 500 mg via INTRAVENOUS
  Filled 2021-05-13 (×7): qty 100

## 2021-05-13 MED ORDER — HEPARIN BOLUS VIA INFUSION
3000.0000 [IU] | Freq: Once | INTRAVENOUS | Status: AC
  Administered 2021-05-13: 3000 [IU] via INTRAVENOUS
  Filled 2021-05-13: qty 3000

## 2021-05-13 MED ORDER — IBUPROFEN 200 MG PO TABS
400.0000 mg | ORAL_TABLET | Freq: Once | ORAL | Status: DC
  Filled 2021-05-13: qty 2

## 2021-05-13 MED ORDER — VANCOMYCIN HCL 10 G IV SOLR
2500.0000 mg | Freq: Once | INTRAVENOUS | Status: AC
  Administered 2021-05-13: 2500 mg via INTRAVENOUS
  Filled 2021-05-13: qty 2500

## 2021-05-13 MED ORDER — SODIUM CHLORIDE 0.9 % IV SOLN
2.0000 g | Freq: Two times a day (BID) | INTRAVENOUS | Status: DC
  Administered 2021-05-13 – 2021-05-14 (×2): 2 g via INTRAVENOUS
  Filled 2021-05-13 (×2): qty 2

## 2021-05-13 MED ORDER — TECHNETIUM TO 99M ALBUMIN AGGREGATED
4.4000 | Freq: Once | INTRAVENOUS | Status: AC | PRN
  Administered 2021-05-13: 4.4 via INTRAVENOUS

## 2021-05-13 NOTE — Progress Notes (Signed)
ANTICOAGULATION CONSULT NOTE - Follow Up Consult  Pharmacy Consult for heparin Indication:  r/o VTE  Labs: Recent Labs    05/12/21 1041 05/12/21 1235 05/13/21 0228 05/13/21 1058  HGB 13.4  --  12.5*  --   HCT 41.9  --  37.9*  --   PLT 135*  --  124*  --   HEPARINUNFRC  --   --  <0.10* 0.35  CREATININE 1.71*  --  1.63*  --   TROPONINIHS 40* 40*  --   --     Assessment: 80yo male subtherapeutic on heparin with initial dosing for D-dimer >20 in setting of Covid; VQ scan negative, but continuing work-up with doppler scan. Pharmacy asked to dose heparin.  Heparin level 0.35 and therapeutic on 1800 units/hr. Hgb stable and PLT slightly decreased at 124 (from 135 yesterday). No signs of bleeding or IV line problems per RN.   Goal of Therapy:  Heparin level 0.3-0.7 units/ml   Plan:  Continue IV heparin gtt at 1800 units/h Confirmatory heparin level in 8 h Daily heparin level, CBC Monitor for signs and symptoms of bleeding  Thank you for involving pharmacy in this patient's care.  Elita Quick, PharmD PGY1 Ambulatory Care Pharmacy Resident 05/13/2021 1:31 PM  **Pharmacist phone directory can be found on [email or web address] listed under [place]**

## 2021-05-13 NOTE — Sepsis Progress Note (Signed)
[facility] is following this code sepsis ?

## 2021-05-13 NOTE — Significant Event (Signed)
Rapid Response Event Note   Reason for Call :  Change in overall appearence  Initial Focused Assessment:  I was called by the RN because this patient has had a change in mentation and overall status. She states that at lunch, noon today, the patient was doing work on his computer and interacting with his family. Now he is laying in bed with rigors and is more lethargic. The patient has spiked a temperature of 101. He was given some tylenol for fever. This patient came in with chest pain. The patient has an elevated D-dimer but the VQ has low probability of PE. We are awaiting on LE dopplers as well as an echo.   His vitals have changed since noon. He is more hypertensive than before and normally is bradycardic at baseline. The patient's rectal temp was 104.6 when we checked him.   BP 187/81 HR 81 O2 90 Temp 104.6 rectal   Interventions:  Patient given medication. New IV placed. Solumedrol given. Vitals taken.   Plan of Care:  Patient will be transferred to ICU for closer monitoring.    Event Summary:   MD Notified: Dr. Erlinda Hong and Dr. Saunders Revel Call Time: [place] Time: 1400 End Time: [place], RN

## 2021-05-13 NOTE — Progress Notes (Signed)
Progress Note  Patient Name: Arthur Lutz Date of Encounter: 05/13/2021  Primary Cardiologist: Glenetta Hew, MD   Subjective   In interval has had positive D dimer and had negative VQ scan.  In discussion with primary, he is getting LE Duplex US.  Echo is pending.  Notes his CP is gone but he now has a headache.  Inpatient Medications    Scheduled Meds:  vitamin C  1,000 mg Oral BID   azelastine  1 spray Each Nare BID   cholecalciferol  5,000 Units Oral Daily   ezetimibe  10 mg Oral Daily   insulin aspart  0-9 Units Subcutaneous TID WC   isosorbide mononitrate  60 mg Oral Daily   magnesium gluconate  500 mg Oral QHS   multivitamin  1 tablet Oral BID   simvastatin  20 mg Oral QHS   triazolam  0.1875 mg Oral QHS   zinc sulfate  220 mg Oral Daily   Continuous Infusions:  sodium chloride 75 mL/hr at 05/13/21 0755   heparin 1,800 Units/hr (05/13/21 0652)   PRN Meds: acetaminophen, albuterol, fluticasone, hydrALAZINE, nitroGLYCERIN   Vital Signs    Vitals:   05/12/21 2129 05/13/21 0340 05/13/21 0538 05/13/21 0743  BP: (!) 157/77 (!) 154/84  (!) 155/75  Pulse: (!) 50 70  (!) 43  Resp: 18   18  Temp: 99.2 F (37.3 C)   98.1 F (36.7 C)  TempSrc: Oral   Oral  SpO2: 94% 91%  96%  Height:   6' (1.829 m)     Intake/Output Summary (Last 24 hours) at 05/13/2021 1149 Last data filed at 05/13/2021 0507 Gross per 24 hour  Intake 604.37 ml  Output --  Net 604.37 ml   There were no vitals filed for this visit.  Telemetry    Sinus bradycardia to sinus rhythm PACs, Afib labeled tracings have clear p waves - Personally Reviewed  ECG    No new - Personally Reviewed  Physical Exam   GEN: No acute distress.   Neck: No JVD Cardiac: RRR, no murmurs, rubs, or gallops.  Respiratory: Clear to auscultation bilaterally. GI: Soft, nontender, non-distended  MS: No edema; No deformity. Neuro:  Nonfocal  Psych: Normal affect   Labs    Chemistry Recent Labs  Lab  05/12/21 1041 05/13/21 0228  NA 142 140  K 3.8 3.9  CL 104 104  CO2 28 27  GLUCOSE 126* 176*  BUN 19 20  CREATININE 1.71* 1.63*  CALCIUM 9.3 8.9  PROT 6.5 5.8*  ALBUMIN 3.9 3.5  AST 18 13*  ALT 18 15  ALKPHOS 65 57  BILITOT 1.2 1.2  GFRNONAA 41* 43*  ANIONGAP 10 9     Hematology Recent Labs  Lab 05/12/21 1041 05/13/21 0228  WBC 6.8 6.5  RBC 4.35 3.99*  HGB 13.4 12.5*  HCT 41.9 37.9*  MCV 96.3 95.0  MCH 30.8 31.3  MCHC 32.0 33.0  RDW 13.0 13.0  PLT 135* 124*    Cardiac EnzymesNo results for input(s): TROPONINI in the last 168 hours. No results for input(s): TROPIPOC in the last 168 hours.   BNP Recent Labs  Lab 05/12/21 1513  BNP 8.9     DDimer  Recent Labs  Lab 05/12/21 1450 05/13/21 0228  DDIMER >20.00* >20.00*     Radiology    DG Chest 2 View  Result Date: 05/12/2021 CLINICAL DATA:  Chest pain EXAM: CHEST - 2 VIEW COMPARISON:  Chest radiograph 11/27/2020 FINDINGS: The left  chest wall port is in stable position. The cardiomediastinal silhouette is stable. There is unchanged asymmetric elevation of the right hemidiaphragm. Linear opacities projecting over the right base likely reflect atelectasis and/or scar. There is no focal consolidation. There is no pulmonary edema. There is no pleural effusion or pneumothorax. There is no acute osseous abnormality. IMPRESSION: Right basilar atelectasis and/or scar. Otherwise, no radiographic evidence of acute cardiopulmonary process. Electronically Signed   By: Valetta Mole M.D.   On: 05/12/2021 11:22   NM Pulmonary Perfusion  Result Date: 05/13/2021 CLINICAL DATA:  PE suspected.  Positive D-dimer. EXAM: NUCLEAR MEDICINE PERFUSION LUNG SCAN TECHNIQUE: Perfusion images were obtained in multiple projections after intravenous injection of radiopharmaceutical. Ventilation scans intentionally deferred if perfusion scan and chest x-ray adequate for interpretation during COVID 19 epidemic. RADIOPHARMACEUTICALS:  4.4  millicuries mCi MS-11D MAA IV COMPARISON:  None. FINDINGS: Appropriate radiotracer uptake throughout both lungs. No perfusion defect seen to suggest pulmonary embolism. IMPRESSION: No evidence of pulmonary embolism. Electronically Signed   By: Franki Cabot M.D.   On: 05/13/2021 09:46   US Abdomen Limited RUQ (LIVER/GB)  Result Date: 05/12/2021 CLINICAL DATA:  Right upper quadrant pain EXAM: ULTRASOUND ABDOMEN LIMITED RIGHT UPPER QUADRANT COMPARISON:  None. FINDINGS: Gallbladder: No gallstones or wall thickening visualized. No sonographic Murphy sign noted by sonographer. Common bile duct: Diameter: 0.5 cm, within normal limits Liver: Multiple cystic masses noted in the left hepatic lobe, as seen on prior CT. The largest measures 3.8 x 2.9 x 4.8 cm. No suspicious solid mass. Within normal limits in parenchymal echogenicity. Portal vein is patent on color Doppler imaging with normal direction of blood flow towards the liver. Other: None. IMPRESSION: No sonographic finding to explain the patient's acute right upper quadrant pain. Electronically Signed   By: Audie Pinto M.D.   On: 05/12/2021 16:40     Patient Profile     80 y.o. male 80 yo M with new AKI, CP, abdominal pain, vomiting and diarrhea in the setting of COVID-19  Assessment & Plan      CAD s/p PCI in 2013 [facility])  HTN with DM, HLD Chest pain  Covid-19  - will hold aldactone and lisinopril; hold lasix 20 mg PO daily; AKI is iimproving - continue Zocor and Zetia; outpatient lipid clinic referral for PCKS9i reasonable (LDL 90s) - Imdur to 60  mg PO daily - will get echocardiogram (still pending) - ideally conservative medical management until resolution of AKI; then outpatient NM stress - getting LE duplex and if negative may DC heparin  Discussed with primary MD, daughter in law, and son    For questions or updates, please contact [facility] Please consult [email or web address] for contact info under Cardiology/STEMI.       Signed, Werner Lean, MD  05/13/2021, 11:49 AM

## 2021-05-13 NOTE — H&P (Signed)
NAME:  Arthur Lutz, MRN:  [id number], DOB:  Dec 05, 1940, LOS: 0 ADMISSION DATE:  05/12/2021, CONSULTATION DATE:  05/13/21 REFERRING MD:  Erlinda Hong, CHIEF COMPLAINT:  Confusion, dyspnea   History of Present Illness:  56yM with history of CAD, HTN, DM2, venous stasis who presented to ED 10/15 for CP. Had brief bout of diarrhea/vomiting earlier in week, later developed gradually worsening CP. Found to have covid-19 and started on solumedrol (pt declined remdesivir), maintenance fluids. Workup here has been unrevealing otherwise including V/Q scan.  Today we were called for abrupt onset of increased work of breathing, desaturation prompting increase in O2 from 3L to 6L, and encephalopathy and rapid increase in temp to 104.7.  Pertinent  Medical History  CAD Obesity HTN Prior covid-19 infection ?Biliary dyskinesia   Significant Hospital Events: Including procedures, antibiotic start and stop dates in addition to other pertinent events     Interim History / Subjective:  On my first evaluation on 6E he was unable to complete 1 word answers to questions in setting of temp 104.7, rigors, some increased work of breathing. On arrival to [facility] is alert, a little irritable and answers questions appropriately and has relatively normal work of breathing.  Objective   Blood pressure (!) 161/70, pulse 89, temperature (!) 102.2 F (39 C), resp. rate 17, height 6' (1.829 m), SpO2 94 %.        Intake/Output Summary (Last 24 hours) at 05/13/2021 1649 Last data filed at 05/13/2021 1200 Gross per 24 hour  Intake 604.37 ml  Output 300 ml  Net 304.37 ml   There were no vitals filed for this visit.  Examination: General appearance: 80 y.o., male, NAD, conversant  Eyes: anicteric sclerae, tracking HENT: NCAT; dry MM Lungs: CTAB, no crackles, no wheeze CV: RRR, no MRGs  Abdomen: Soft, non-tender; non-distended, BS present  Extremities: No peripheral edema, radial and DP pulses present bilaterally   Skin: Normal temperature, turgor and texture; no rash Psych: Appropriate affect, a little agitated Neuro: no focal deficit   S Cr stable 1.7 Lactic 2  CXR RLL atelectasis WBC stable  Resolved Hospital Problem list     Assessment & Plan:   # Acute metabolic encephalopathy # Hyperthermia Encephalopathy seems to be directly related to degree of hyperthermia. - cooling blanket - scheduled tylenol - mgmt   # Sepsis  # Acute hypoxic respiratory failure Sepsis and hyperthermia likely due to covid but abdominal distension, nausea/vomiting, abdominal pain raise possibility of obstruction or intraabdominal infection. Also has history of ?biliary dyskinesia but [place] ok here.  - BCx start vanc/cefepime/flagyl - CT A/P w contrast, CT chest while he's there - if no convincing alternative source of sepsis consider addition of baricitinib although his crp is only 2 mg/dL  - continue current mgmt of covid-19 - f/u TTE - wean O2 for saturation 88-92  # AKI: - ctm UOP, electrolytes  # CAD: # Chest pain: - cardiology following - has been on heparin gtt awaiting US DVT results prior to stopping if negative - continue coreg, statin  # DM2: - SSI     Best Practice (right click and "Reselect all SmartList Selections" daily)   Diet/type: NPO DVT prophylaxis: prophylactic heparin  GI prophylaxis: H2B Lines: N/A Foley:  N/A and Yes, and it is still needed - hyperthermia Code Status:  full code Last date of multidisciplinary goals of care discussion [today with daughter and wife - willing to allow time-limited trial of ventilation if he were to need  it and ok with CPR. Would not want long term ventilation or life support measures.]  Labs   CBC: Recent Labs  Lab 05/12/21 1041 05/13/21 0228 05/13/21 1442  WBC 6.8 6.5 10.8*  NEUTROABS 4.8  --  9.7*  HGB 13.4 12.5* 13.3  HCT 41.9 37.9* 40.8  MCV 96.3 95.0 93.6  PLT 135* 124* 144*    Basic Metabolic Panel: Recent Labs  Lab  05/12/21 1041 05/13/21 0228  NA 142 140  K 3.8 3.9  CL 104 104  CO2 28 27  GLUCOSE 126* 176*  BUN 19 20  CREATININE 1.71* 1.63*  CALCIUM 9.3 8.9  MG  --  2.1  PHOS  --  3.4   GFR: CrCl cannot be calculated (Unknown ideal weight.). Recent Labs  Lab 05/12/21 1041 05/13/21 0228 05/13/21 1442 05/13/21 1507  PROCALCITON  --   --   --  <0.10  WBC 6.8 6.5 10.8*  --     Liver Function Tests: Recent Labs  Lab 05/12/21 1041 05/13/21 0228  AST 18 13*  ALT 18 15  ALKPHOS 65 57  BILITOT 1.2 1.2  PROT 6.5 5.8*  ALBUMIN 3.9 3.5   Recent Labs  Lab 05/12/21 1041  LIPASE 26   No results for input(s): AMMONIA in the last 168 hours.  ABG No results found for: PHART, PCO2ART, PO2ART, HCO3, TCO2, ACIDBASEDEF, O2SAT   Coagulation Profile: Recent Labs  Lab 05/13/21 1442  INR 1.1    Cardiac Enzymes: No results for input(s): CKTOTAL, CKMB, CKMBINDEX, TROPONINI in the last 168 hours.  HbA1C: Hgb A1c MFr Bld  Date/Time Value Ref Range Status  05/13/2021 03:30 PM 6.9 (H) 4.8 - 5.6 % Final    Comment:    (NOTE) Pre diabetes:          5.7%-6.4%  Diabetes:              >6.4%  Glycemic control for   <7.0% adults with diabetes   11/24/2015 07:31 AM 7.5 (H) <5.7 % Final    Comment:      For someone without known diabetes, a hemoglobin A1c value of 6.5% or greater indicates that they may have diabetes and this should be confirmed with a follow-up test.   For someone with known diabetes, a value <7% indicates that their diabetes is well controlled and a value greater than or equal to 7% indicates suboptimal control. A1c targets should be individualized based on duration of diabetes, age, comorbid conditions, and other considerations.   Currently, no consensus exists for use of hemoglobin A1c for diagnosis of diabetes for children.       CBG: Recent Labs  Lab 05/12/21 2143 05/13/21 0741 05/13/21 1148  GLUCAP 180* 211* 272*    Review of Systems:   Unable  to obtain initially in setting of encephalopathy  Past Medical History:  He,  has a past medical history of Allergic rhinitis, Aortic valve sclerosis, Arthritis of knee, left, needs total Knee in future with Dr. Wynelle Link  (08/07/2011), Bladder cancer ([place]) (11/2005), CAD S/P percutaneous coronary angioplasty (08/07/2011), Diabetes mellitus type 2, controlled, with complications ([place]), Diabetes mellitus without complication ([place]), Dyslipidemia, History of heart attack (11/2007), Hypertension, Macular degeneration, Macular degeneration of right eye, Melanoma of skin ([place]) (~ 2005), Myocardial infarction ([place]) (2012), Osteoarthritis, Portacath in place (08/11/2012), Statin intolerance (08/07/2011), and Stroke ([place]) (1980's).   Surgical History:   Past Surgical History:  Procedure Laterality Date   BIOPSY  05/28/2019   Procedure: BIOPSY;  Surgeon:  Rogene Houston, MD;  Location: [facility];  Service: Endoscopy;;  cecum   CARDIAC CATHETERIZATION  August '13   Widely patent RCA and LCx stents. Also patent PTCA site to D1, ~40-50% D2. Normal EF, normal EDP   CATARACT EXTRACTION W/ INTRAOCULAR LENS IMPLANT  ~ 2010   right   CATARACT EXTRACTION W/PHACO Left 11/21/2016   Procedure: CATARACT EXTRACTION PHACO AND INTRAOCULAR LENS PLACEMENT (IOC) CDE - 11.70 ;  Surgeon: Tonny Branch, MD;  Location: [facility] OR[facility];  Service: Ophthalmology;  Laterality: Left;  left   cold cup removal bladder lesion  11/2005   "malignant"   COLONOSCOPY N/A 12/09/2014   Procedure: COLONOSCOPY;  Surgeon: Rogene Houston, MD;  Location: [facility];  Service: Endoscopy;  Laterality: N/A;  955 - moved to 8:30 - Ann to notify pt   COLONOSCOPY WITH PROPOFOL N/A 05/28/2019   Procedure: COLONOSCOPY WITH PROPOFOL;  Surgeon: Rogene Houston, MD;  [facility]: [facility];  Service: Endoscopy;  Laterality: N/A;   CORONARY ANGIOPLASTY WITH STENT PLACEMENT  11/2007   Mid RCA - Driver BMS 3.5 mm x 15 mm; 2.0 mm Cutting Balloon PTCA - ostial D1   CORONARY  ANGIOPLASTY WITH STENT PLACEMENT  08/07/11   Abnormal TM Myoview - for exertional throat discomfort: Patent RCA stent, 90% circumflex -- Vision BMS 3.5 mm x 12 mm --> 4.2 mm. Ostial D2 95% -- 2.25 mm Cutting Balloon PTCA   DOPPLER ECHOCARDIOGRAPHY  May 2009   Normal EF, impaired relaxation, with sclerotic aortic valve. No stenosis.   INGUINAL HERNIA REPAIR  ~ 1970   left   KNEE ARTHROSCOPY  02/20/11   left   LEFT HEART CATHETERIZATION WITH CORONARY ANGIOGRAM N/A 08/07/2011   Procedure: LEFT HEART CATHETERIZATION WITH CORONARY ANGIOGRAM;  Surgeon: Leonie Man, MD;  Location: [facility] CATH LAB;  Service: Cardiovascular;  Laterality: N/A;   LEFT HEART CATHETERIZATION WITH CORONARY ANGIOGRAM N/A 03/06/2012   Procedure: LEFT HEART CATHETERIZATION WITH CORONARY ANGIOGRAM;  Surgeon: Leonie Man, MD;  Location: [facility] CATH LAB;  Service: Cardiovascular;  Laterality: N/A;   NM MYOVIEW LTD  Jan. 31, 2014   Low risk, EF 49%, improved from prior. Small apical and apical septal defect, fixed likely artifact no skin or infarction.   PARTIAL KNEE ARTHROPLASTY  06/2003   right   PERCUTANEOUS CORONARY STENT INTERVENTION (PCI-S)  08/07/2011   Procedure: PERCUTANEOUS CORONARY STENT INTERVENTION (PCI-S);  Surgeon: Leonie Man, MD;  Location: [facility] CATH LAB;  Service: Cardiovascular;;   POLYPECTOMY  05/28/2019   Procedure: POLYPECTOMY;  Surgeon: Rogene Houston, MD;  [facility]: [facility];  Service: Endoscopy;;  colon   TONSILLECTOMY     "as a child"   TOTAL KNEE ARTHROPLASTY  11/2003   right x2 and one on left- Total of 3.   TOTAL KNEE ARTHROPLASTY Left 09/28/2012   Procedure: TOTAL KNEE ARTHROPLASTY;  Surgeon: Gearlean Alf, MD;  Location: [facility] ORS;  Service: Orthopedics;  Laterality: Left;     Social History:   reports that he quit smoking about 40 years ago. His smoking use included cigarettes. He has a 56.00 pack-year smoking history. He has never used smokeless tobacco. He reports current alcohol use of about  2.0 standard drinks per week. He reports that he does not use drugs.   Family History:  His family history includes Emphysema in his father; Lung cancer in his paternal uncle. There is no history of Colon cancer.   Allergies Allergies  Allergen  Reactions   Aspirin Other (See Comments)    Was told to not take this due to having macular degeneration   Rosuvastatin Calcium Other (See Comments)    Aches and cramps   Other Rash and Other (See Comments)    Red meat = develops a rash on the back     Home Medications  Prior to Admission medications   Medication Sig Start Date End Date Taking? Authorizing Provider  acetaminophen (TYLENOL) 650 MG CR tablet Take 650 mg by mouth every 8 (eight) hours as needed for pain.   Yes [provider]  Ascorbic Acid (VITAMIN C) 1000 MG tablet Take 1,000 mg by mouth 2 (two) times daily.   Yes [provider]  azelastine (ASTELIN) 0.1 % nasal spray USE 2 SPRAYS IN EACH NOSTRIL TWICE DAILY Patient taking differently: 2 (two) times daily as needed. 09/28/20  Yes [name], FNP  budesonide (PULMICORT) 0.5 MG/2ML nebulizer solution FLUSH 2 ML MIXED WITH NASAL SALINE INTO THE NASAL PASSAGES DAILY AS DIRECTED. 10/27/19  Yes Valentina Shaggy, MD  carvedilol (COREG) 3.125 MG tablet Take 1.5625 mg by mouth daily. Taking 1/2 tablet once daily   Yes [provider]  cetirizine (ZYRTEC ALLERGY) 10 MG tablet Take 1 tablet (10 mg total) by mouth daily. Patient taking differently: Take 10 mg by mouth as needed. 08/16/20  Yes [name], FNP  Cholecalciferol (VITAMIN D) 125 MCG (5000 UT) CAPS Take 5,000 Units by mouth daily.   Yes [provider]  clobetasol (TEMOVATE) 0.05 % external solution Apply 1 application topically daily as needed (irritation).  03/01/19  Yes [provider]  Coenzyme Q10 (CO Q-10) 100 MG CAPS Take 300 capsules by mouth 3 (three) times daily. Patient taking differently: Take 400 mg by mouth daily.  08/29/17  Yes Leonie Man, MD  cyclobenzaprine (FLEXERIL) 10 MG tablet Take 1 tablet (10 mg total) by mouth 2 (two) times daily as needed for muscle spasms. 03/30/19  Yes Maudie Flakes, MD  EPINEPHrine 0.3 mg/0.3 mL IJ SOAJ injection Inject 0.3 mg into the muscle as needed for anaphylaxis.  12/25/18  Yes [provider]  ezetimibe (ZETIA) 10 MG tablet Take 1 tablet (10 mg total) by mouth daily. 04/09/21 07/08/21 Yes Leonie Man, MD  Fluticasone Propionate Truett Perna) 93 MCG/ACT [place] 2 application into the nose 2 (two) times daily. Patient taking differently: Place 2 application into the nose as needed (congestion). 09/08/19  Yes Valentina Shaggy, MD  furosemide (LASIX) 20 MG tablet Take 1 tablet (20 mg total) by mouth as needed. for worsening swelling -- (take instead of HCTZ). Patient taking differently: Take 20 mg by mouth daily. for worsening swelling -- (take instead of HCTZ). 02/02/21 07/12/21 Yes Leonie Man, MD  indomethacin (INDOCIN) 50 MG capsule Take 50 mg by mouth 2 (two) times daily as needed for mild pain. 03/27/20  Yes [provider]  isosorbide mononitrate (IMDUR) 30 MG 24 hr tablet TAKE 1 TABLET(30 MG) BY MOUTH DAILY Patient taking differently: Take 30 mg by mouth daily. 02/15/21  Yes Leonie Man, MD  lisinopril (ZESTRIL) 40 MG tablet TAKE 1/2 TABLET BY MOUTH DAILY Patient taking differently: Take 20 mg by mouth daily. 04/30/21  Yes Leonie Man, MD  loperamide (IMODIUM) 2 MG capsule Take 1 capsule (2 mg total) by mouth 4 (four) times daily as needed for diarrhea or loose stools. 03/30/19  Yes Maudie Flakes, MD  LOTEMAX 0.5 % [place]  1 application into the left eye daily as needed (irritation, dry eyes). 02/15/21  Yes [provider]  magnesium gluconate (MAGONATE) 500 MG tablet Take 500 mg by mouth daily.   Yes [provider]  meloxicam (MOBIC) 15 MG tablet Take 15 mg by mouth daily as needed for pain. 02/22/21  Yes  [provider]  metFORMIN (GLUCOPHAGE) 500 MG tablet Take 500 mg by mouth daily with breakfast.    Yes [provider]  Multiple Vitamins-Minerals (PRESERVISION AREDS PO) Take 1 capsule by mouth 2 (two) times daily.   Yes [provider]  prednisoLONE acetate (PRED FORTE) 1 % ophthalmic suspension Place 1 drop into both eyes 2 (two) times daily as needed (irritation). 03/08/21  Yes [provider]  predniSONE (DELTASONE) 10 MG tablet Take 1 tablet (10 mg total) by mouth daily as needed (arthritis). 3,3, 2, 1 tabs 10/08/19  Yes Leonie Man, MD  simvastatin (ZOCOR) 20 MG tablet Take 1 tablet (20 mg total) by mouth at bedtime. 04/09/21 07/08/21 Yes Leonie Man, MD  tacrolimus (PROTOPIC) 0.1 % ointment Apply topically 2 (two) times daily. Patient taking differently: Apply 1 application topically 2 (two) times daily as needed (irritation). 09/15/19  Yes Valentina Shaggy, MD  tobramycin (TOBREX) 0.3 % ophthalmic solution Place 1 drop into both eyes daily as needed (before , during and after injection). 02/20/21  Yes [provider]  triazolam (HALCION) 0.125 MG tablet Take 1.5 tablets (0.1875 mg total) by mouth at bedtime. Pt takes 1.5 tabs at bedtime Patient taking differently: 0.125 mg. Take 2 tablets at bedtime by mouth 08/29/17  Yes Leonie Man, MD  Turmeric 500 MG CAPS Take 1,000 mg by mouth daily.   Yes [provider]  spironolactone (ALDACTONE) 25 MG tablet Take 1 tablet (25 mg total) by mouth daily. 05/02/21 07/31/21  Leonie Man, MD     Critical care time: 30 minutes

## 2021-05-13 NOTE — Progress Notes (Signed)
Pharmacy Antibiotic Note  Arthur Lutz is a 80 y.o. male admitted on 05/12/2021 with chest pain and abdominal pain. Patient spiked a fever, became confused, and developed hypoxia on progressive care unit. New orders to transfer to ICU and start broad spectrum antibiotics with vancomycin and cefepime.   Patient currently febrile at 102.2, wbc 10, scr elevated at 1.7.   Vancomycin 1250 mg IV Q 24 hrs. Goal AUC 400-550. Expected AUC: 531 SCr used: 1.7   Plan: Vancomycin 1750 IV every 36 hours.  Goal trough 15-20 mcg/mL. Cefepime 2g IV q12 hours  Height: 6' (182.9 cm) IBW/kg (Calculated) : 77.6  Temp (24hrs), Avg:100.1 F (37.8 C), Min:98 F (36.7 C), Max:104.6 F (40.3 C)  Recent Labs  Lab 05/12/21 1041 05/13/21 0228 05/13/21 1442  WBC 6.8 6.5 10.8*  CREATININE 1.71* 1.63* 1.71*  LATICACIDVEN  --   --  2.1*    CrCl cannot be calculated (Unknown ideal weight.).    Allergies  Allergen Reactions   Aspirin Other (See Comments)    Was told to not take this due to having macular degeneration   Rosuvastatin Calcium Other (See Comments)    Aches and cramps   Other Rash and Other (See Comments)    Red meat = develops a rash on the back   Thank you for allowing pharmacy to be a part of this patient's care.  Erin Hearing PharmD., BCPS Clinical Pharmacist 05/13/2021 5:43 PM

## 2021-05-13 NOTE — Progress Notes (Signed)
ABG drawn per order x1 attempt. Results given to Dr. Verlee Monte. No new orders at this time. RT will continue to monitor and be available as needed.

## 2021-05-13 NOTE — Plan of Care (Signed)
  Problem: Education: Goal: Knowledge of General Education information will improve Description: Including pain rating scale, medication(s)/side effects and non-pharmacologic comfort measures Outcome: Completed/Met

## 2021-05-13 NOTE — Progress Notes (Signed)
Pt transferred to 2H03. Report given to Barnetta Chapel, [place]. All belongings sent with patient's family, family aware of transfer. Lenna Sciara, RN, BSN

## 2021-05-13 NOTE — Progress Notes (Signed)
Notified MD Erlinda Hong of critical lactic result of 2.1 via phone.

## 2021-05-13 NOTE — Sepsis Progress Note (Signed)
Following for Sepsis monitoring. Blood cultures drawn, antibiotics given and lactic acid has cleared. VSS. Patient now on ICU. Will close sepsis monitoring at this time and continue to follow as ICU patient.

## 2021-05-13 NOTE — Progress Notes (Signed)
ANTICOAGULATION CONSULT NOTE - Follow Up Consult  Pharmacy Consult for heparin Indication:  r/o VTE  Labs: Recent Labs    05/12/21 1041 05/12/21 1235 05/13/21 0228  HGB 13.4  --  12.5*  HCT 41.9  --  37.9*  PLT 135*  --  124*  HEPARINUNFRC  --   --  <0.10*  CREATININE 1.71*  --  1.63*  TROPONINIHS 40* 40*  --     Assessment: 79yo male subtherapeutic on heparin with initial dosing for D-dimer >20 in setting of Covid; no infusion issues or signs of bleeding per RN other than some bleeding while changing IV site which resolved easily.  Goal of Therapy:  Heparin level 0.3-0.7 units/ml   Plan:  Will rebolus with heparin 3000 units and increase heparin infusion by 3-4 units/kg/hr to 1800 units/hr and check level in 8 hours.    Wynona Neat, PharmD, BCPS  05/13/2021,3:56 AM

## 2021-05-13 NOTE — Progress Notes (Signed)
ANTICOAGULATION CONSULT NOTE - Follow Up Consult  Pharmacy Consult for heparin Indication:  r/o VTE  Labs: Recent Labs    05/12/21 1041 05/12/21 1235 05/13/21 0228 05/13/21 1058 05/13/21 1442 05/13/21 1702 05/13/21 1751 05/13/21 1905  HGB 13.4  --  12.5*  --  13.3  --  12.6*  --   HCT 41.9  --  37.9*  --  40.8  --  37.0*  --   PLT 135*  --  124*  --  144*  --   --   --   APTT  --   --   --   --  40*  --   --   --   LABPROT  --   --   --   --  14.5  --   --   --   INR  --   --   --   --  1.1  --   --   --   HEPARINUNFRC  --   --  <0.10* 0.35  --   --   --  0.22*  CREATININE 1.71*  --  1.63*  --  1.71*  --   --   --   CKTOTAL  --   --   --   --   --  35*  --   --   TROPONINIHS 40* 40*  --   --   --   --   --   --     Assessment: 80yo male subtherapeutic on heparin with initial dosing for D-dimer >20 in setting of Covid; VQ scan negative, but continuing work-up with doppler scan. Pharmacy asked to dose heparin.  Heparin level down below goal at 0.22 on 1800 units/hr. Hgb stable and PLT slightly decreased at 124 (from 135 yesterday). No signs of bleeding or IV line problems noted.   Goal of Therapy:  Heparin level 0.3-0.7 units/ml   Plan:  Increase IV heparin gtt to 2000 units/h Recheck heparin level in 8 hr Daily heparin level, CBC Monitor for signs and symptoms of bleeding  Thank you for involving pharmacy in this patient's care.  Erin Hearing PharmD., BCPS Clinical Pharmacist 05/13/2021 8:53 PM

## 2021-05-13 NOTE — Progress Notes (Addendum)
PROGRESS NOTE    Arthur Lutz  FUX:[id number] DOB: Dec 27, 1940 DOA: 05/12/2021 PCP: Redmond School, MD    Chief Complaint  Patient presents with   Chest Pain    Brief Narrative:  54yr with h/o CAD status post percutaneous coronary angioplasty 2013, hypertension, type 2 diabetes, hyperlipidemia, venous stasis, presenting to ED with chest pain. Patient notes that he has been feeling chest pain, abdominal pain that has worsened for the past two months but it had improved recently He states he threw up all night long every 10-15 minutes last night. Had one episode of diarrhea and vomiting earlier in week (Wednesday).  Subjective:  Started shivering then spiked fever and became confused and developed hypoxia , o2 sats down to 89%, is put on 4liters His blood pressure is elevated at 180, heart rate at around 70's Later vomited large amount of digested food, no visible blood  Rapid response at bedside  Family at bedside   Assessment & Plan:   Principal Problem:   Chest pain Active Problems:   CAD S/P percutaneous coronary angioplasty: 08/07/11 cutting balloon PTCA to 95% ostial diag. and 90%  proximal LCX.  prior cutting balloon to RCA  & ostium of ostial diag., 2009    Essential hypertension   Diabetes mellitus type 2, controlled, with complications ([place])   Hyperlipidemia associated with type 2 diabetes mellitus ([place])   Venous stasis of both lower extremities   AKI (acute kidney injury) ([place])   COVID-19 virus infection   Elevated d-dimer    Sepsis, acute metabolic encephalopathy , likely from covid 19 infection ( reports covid 19 infection last year, received monoclonal abx ) Appear acutely decompensated this am Initial cxr does not appear to have acute infiltrate Will check blood culture He does c/o left hip pain with known history of left hip arthritis, will get hip x ray, may need MRI of left hip to rule out septic arthritis if blood culture + or symptom does not improve  with covid treatment Patient does not want remdesivir , monoclonal antibody is not indicated with severe disease , family agreed with steroids treatment Initiate Sepsis protocol , case discussed with critical care over the phone.   Patient did reports right upper quadrant pain, on further review cxr with "Linear opacities projecting over the right base " Pneumonia? From covid19? Will check procalcitonin, urine strep pneumo antigen, urine legionella antigen, will defer critical care regarding CT chest ( can not have CT chest with contrast for now due to [facility])  N/V Denies ab pain currently, but did reports ab pain in the last two months Lft unremarkable, ab [place] no acute findings Ab exam benign, will defer critical care regarding kub From covid infection? Getting lactic acid Prn zofran   Mild thrombocytopenia -likely from sepsis, monitor  Significantly elevated ddimer Recent travel history V/Q scan low probability Currently no lower extremity edema, venous doppler pending He is started on heparin drip since admission due to c/o cp, recent travel history and mild elevation of troponin Plan to d/c heparin drip if venous doppler no DVT  AKI -from dehydration and sepsis Hold lasix/aldactone, lisinopril  -getting hydration -cr slightly improved  CAD s/p PCI in 2013  Mild troponin elevation and reports chest pain on presentation Cardiology consulted, echo pending Per cardiology "conservative medical management until resolution of AKI; then outpatient NM stress"  NIDDM2 Hold metformin, on ssi here   HTN: Blood pressure elevated Currently on imdur, prn IV hydralazine He is on coreg 3.125  bid at home ,resume  H/o bradycardia On coreg 3.125 bid at home Monitor heart rate  HLD with h/o statin intolerance  Continue home meds,    : Body mass index is 35.29 kg/m.Marland Kitchen     Unresulted Labs (From admission, onward)     Start     Ordered   05/14/21 0500  Heparin level  (unfractionated)  Daily,   R      05/12/21 1711   05/13/21 1900  Heparin level (unfractionated)  Once-Timed,   TIMED       Question:  Specimen collection method  Answer:  Lab=Lab collect   05/13/21 1334   05/13/21 0500  CBC  Daily,   R      05/12/21 1711   05/13/21 0500  Comprehensive metabolic panel  Daily,   R      05/12/21 2046   05/13/21 0500  C-reactive protein  Daily,   R      05/12/21 2046   05/13/21 0500  D-dimer, quantitative  Daily,   R      05/12/21 2046   05/13/21 0500  Ferritin  Daily,   R      05/12/21 2046   05/13/21 0500  Magnesium  Daily,   R      05/12/21 2046   05/13/21 0500  Phosphorus  Daily,   R      05/12/21 2046              DVT prophylaxis: currently on heparin drip   Code Status:full Family Communication: family at bedside  Disposition:   inpatient   Dispo: The patient is from: home               Anticipated d/c is to: TBD              Anticipated d/c date is: TBD, patient is currently critically ill                Consultants:  Cardiology Critical care  Procedures:  none  Antimicrobials:   Anti-infectives (From admission, onward)    None           Objective: Vitals:   05/13/21 0538 05/13/21 0743 05/13/21 1151 05/13/21 1310  BP:  (!) 155/75 (!) 161/70   Pulse:  (!) 43 89   Resp:  18 17   Temp:  98.1 F (36.7 C) 98 F (36.7 C) 99.2 F (37.3 C)  TempSrc:  Oral Oral   SpO2:  96% 94%   Height: 6' (1.829 m)       Intake/Output Summary (Last 24 hours) at 05/13/2021 1355 Last data filed at 05/13/2021 1200 Gross per 24 hour  Intake 604.37 ml  Output 300 ml  Net 304.37 ml   There were no vitals filed for this visit.  Examination:  General exam: shivering ,confused, lethargic, in acute distress Respiratory system: Clear to auscultation. Respiratory effort normal. Cardiovascular system:  RRR.  Gastrointestinal system: Abdomen is nondistended, soft and nontender.  Normal bowel sounds . Central nervous system:  confused and lethargic Extremities:  no edema Skin: No rashes, lesions or ulcers Psychiatry: confused and lethargic    Data Reviewed: I have personally reviewed following labs and imaging studies  CBC: Recent Labs  Lab 05/12/21 1041 05/13/21 0228  WBC 6.8 6.5  NEUTROABS 4.8  --   HGB 13.4 12.5*  HCT 41.9 37.9*  MCV 96.3 95.0  PLT 135* 124*    Basic Metabolic Panel: Recent Labs  Lab 05/12/21 1041 05/13/21  0228  NA 142 140  K 3.8 3.9  CL 104 104  CO2 28 27  GLUCOSE 126* 176*  BUN 19 20  CREATININE 1.71* 1.63*  CALCIUM 9.3 8.9  MG  --  2.1  PHOS  --  3.4    GFR: CrCl cannot be calculated (Unknown ideal weight.).  Liver Function Tests: Recent Labs  Lab 05/12/21 1041 05/13/21 0228  AST 18 13*  ALT 18 15  ALKPHOS 65 57  BILITOT 1.2 1.2  PROT 6.5 5.8*  ALBUMIN 3.9 3.5    CBG: Recent Labs  Lab 05/12/21 2143 05/13/21 0741 05/13/21 1148  GLUCAP 180* 211* 272*     Recent Results (from the past 240 hour(s))  Resp Panel by RT-PCR (Flu A&B, Covid) Nasopharyngeal Swab     Status: Abnormal   Collection Time: 05/12/21  1:05 PM   Specimen: Nasopharyngeal Swab; Nasopharyngeal(NP) swabs in vial transport medium  Result Value Ref Range Status   SARS Coronavirus 2 by RT PCR POSITIVE (A) NEGATIVE Final    Comment: RESULT CALLED TO, READ BACK BY AND VERIFIED WITH:  ZOE CAGLE 05/12/21 1628 ADL  (NOTE) SARS-CoV-2 target nucleic acids are DETECTED.  The SARS-CoV-2 RNA is generally detectable in upper respiratory specimens during the acute phase of infection. Positive results are indicative of the presence of the identified virus, but do not rule out bacterial infection or co-infection with other pathogens not detected by the test. Clinical correlation with patient history and other diagnostic information is necessary to determine patient infection status. The expected result is Negative.  Fact Sheet for  Patients: EntrepreneurPulse.com.au  Fact Sheet for Healthcare Providers: IncredibleEmployment.be  This test is not yet approved or cleared by the [place] FDA and  has been authorized for detection and/or diagnosis of SARS-CoV-2 by FDA under an Emergency Use Authorization (EUA).  This EUA will remain in effect (meaning this test can be use d) for the duration of  the COVID-19 declaration under Section 564(b)(1) of the Act, 21 U.S.C. section 360bbb-3(b)(1), unless the authorization is terminated or revoked sooner.     Influenza A by PCR NEGATIVE NEGATIVE Final   Influenza B by PCR NEGATIVE NEGATIVE Final    Comment: (NOTE) The Xpert Xpress SARS-CoV-2/FLU/RSV plus assay is intended as an aid in the diagnosis of influenza from Nasopharyngeal swab specimens and should not be used as a sole basis for treatment. Nasal washings and aspirates are unacceptable for Xpert Xpress SARS-CoV-2/FLU/RSV testing.  Fact Sheet for Patients: EntrepreneurPulse.com.au  Fact Sheet for Healthcare Providers: IncredibleEmployment.be  This test is not yet approved or cleared by the [place] FDA and has been authorized for detection and/or diagnosis of SARS-CoV-2 by FDA under an Emergency Use Authorization (EUA). This EUA will remain in effect (meaning this test can be used) for the duration of the COVID-19 declaration under Section 564(b)(1) of the Act, 21 U.S.C. section 360bbb-3(b)(1), unless the authorization is terminated or revoked.  Performed at [facility], [place] [address] [place], [place] [address]          Radiology Studies: DG Chest 2 View  Result Date: 05/12/2021 CLINICAL DATA:  Chest pain EXAM: CHEST - 2 VIEW COMPARISON:  Chest radiograph 11/27/2020 FINDINGS: The left chest wall port is in stable position. The cardiomediastinal silhouette is stable. There is unchanged asymmetric elevation of the  right hemidiaphragm. Linear opacities projecting over the right base likely reflect atelectasis and/or scar. There is no focal consolidation. There is no pulmonary edema. There is  no pleural effusion or pneumothorax. There is no acute osseous abnormality. IMPRESSION: Right basilar atelectasis and/or scar. Otherwise, no radiographic evidence of acute cardiopulmonary process. Electronically Signed   By: Valetta Mole M.D.   On: 05/12/2021 11:22   NM Pulmonary Perfusion  Result Date: 05/13/2021 CLINICAL DATA:  PE suspected.  Positive D-dimer. EXAM: NUCLEAR MEDICINE PERFUSION LUNG SCAN TECHNIQUE: Perfusion images were obtained in multiple projections after intravenous injection of radiopharmaceutical. Ventilation scans intentionally deferred if perfusion scan and chest x-ray adequate for interpretation during COVID 19 epidemic. RADIOPHARMACEUTICALS:  4.4 millicuries mCi [id number] MAA IV COMPARISON:  None. FINDINGS: Appropriate radiotracer uptake throughout both lungs. No perfusion defect seen to suggest pulmonary embolism. IMPRESSION: No evidence of pulmonary embolism. Electronically Signed   By: Franki Cabot M.D.   On: 05/13/2021 09:46   US Abdomen Limited RUQ (LIVER/GB)  Result Date: 05/12/2021 CLINICAL DATA:  Right upper quadrant pain EXAM: ULTRASOUND ABDOMEN LIMITED RIGHT UPPER QUADRANT COMPARISON:  None. FINDINGS: Gallbladder: No gallstones or wall thickening visualized. No sonographic Murphy sign noted by sonographer. Common bile duct: Diameter: 0.5 cm, within normal limits Liver: Multiple cystic masses noted in the left hepatic lobe, as seen on prior CT. The largest measures 3.8 x 2.9 x 4.8 cm. No suspicious solid mass. Within normal limits in parenchymal echogenicity. Portal vein is patent on color Doppler imaging with normal direction of blood flow towards the liver. Other: None. IMPRESSION: No sonographic finding to explain the patient's acute right upper quadrant pain. Electronically Signed   By: Audie Pinto M.D.   On: 05/12/2021 16:40        Scheduled Meds:  vitamin C  1,000 mg Oral BID   azelastine  1 spray Each Nare BID   cholecalciferol  5,000 Units Oral Daily   ezetimibe  10 mg Oral Daily   insulin aspart  0-9 Units Subcutaneous TID WC   isosorbide mononitrate  60 mg Oral Daily   magnesium gluconate  500 mg Oral QHS   multivitamin  1 tablet Oral BID   simvastatin  20 mg Oral QHS   triazolam  0.1875 mg Oral QHS   zinc sulfate  220 mg Oral Daily   Continuous Infusions:  sodium chloride 75 mL/hr at 05/13/21 0755   heparin 1,800 Units/hr (05/13/21 0652)     LOS: 0 days   Time spent: 51mins, case discussed with cardiology/critical care/rapid response team/RN /family Greater than 50% of this time was spent in counseling, explanation of diagnosis, planning of further management, and coordination of care.   Voice Recognition Viviann Spare dictation system was used to create this note, attempts have been made to correct errors. Please contact the author with questions and/or clarifications.   Florencia Reasons, MD PhD FACP [facility]  Available via Epic secure chat 7am-7pm for nonurgent issues Please page for urgent issues To page the attending provider between 7A-7P or the covering provider during after hours 7P-7A, please log into the web site [email or web address] and access using universal [place] password for that web site. If you do not have the password, please call the hospital operator.    05/13/2021, 1:55 PM

## 2021-05-14 ENCOUNTER — Inpatient Hospital Stay (HOSPITAL_COMMUNITY): Payer: Medicare Other

## 2021-05-14 DIAGNOSIS — R079 Chest pain, unspecified: Secondary | ICD-10-CM

## 2021-05-14 DIAGNOSIS — R072 Precordial pain: Secondary | ICD-10-CM | POA: Diagnosis not present

## 2021-05-14 DIAGNOSIS — Z9861 Coronary angioplasty status: Secondary | ICD-10-CM

## 2021-05-14 DIAGNOSIS — N179 Acute kidney failure, unspecified: Secondary | ICD-10-CM | POA: Diagnosis not present

## 2021-05-14 DIAGNOSIS — U071 COVID-19: Secondary | ICD-10-CM | POA: Diagnosis not present

## 2021-05-14 DIAGNOSIS — I2510 Atherosclerotic heart disease of native coronary artery without angina pectoris: Secondary | ICD-10-CM

## 2021-05-14 DIAGNOSIS — J9601 Acute respiratory failure with hypoxia: Secondary | ICD-10-CM

## 2021-05-14 DIAGNOSIS — R609 Edema, unspecified: Secondary | ICD-10-CM

## 2021-05-14 DIAGNOSIS — J1282 Pneumonia due to coronavirus disease 2019: Secondary | ICD-10-CM

## 2021-05-14 DIAGNOSIS — E118 Type 2 diabetes mellitus with unspecified complications: Secondary | ICD-10-CM

## 2021-05-14 HISTORY — PX: TRANSTHORACIC ECHOCARDIOGRAM: SHX275

## 2021-05-14 LAB — COMPREHENSIVE METABOLIC PANEL
ALT: 14 U/L (ref 0–44)
AST: 13 U/L — ABNORMAL LOW (ref 15–41)
Albumin: 3.2 g/dL — ABNORMAL LOW (ref 3.5–5.0)
Alkaline Phosphatase: 56 U/L (ref 38–126)
Anion gap: 6 (ref 5–15)
BUN: 25 mg/dL — ABNORMAL HIGH (ref 8–23)
CO2: 26 mmol/L (ref 22–32)
Calcium: 8.7 mg/dL — ABNORMAL LOW (ref 8.9–10.3)
Chloride: 104 mmol/L (ref 98–111)
Creatinine, Ser: 1.28 mg/dL — ABNORMAL HIGH (ref 0.61–1.24)
GFR, Estimated: 58 mL/min — ABNORMAL LOW (ref >60)
Glucose, Bld: 215 mg/dL — ABNORMAL HIGH (ref 70–99)
Potassium: 4 mmol/L (ref 3.5–5.1)
Sodium: 136 mmol/L (ref 135–145)
Total Bilirubin: 0.7 mg/dL (ref 0.3–1.2)
Total Protein: 5.8 g/dL — ABNORMAL LOW (ref 6.5–8.1)

## 2021-05-14 LAB — ECHOCARDIOGRAM COMPLETE
Area-P 1/2: 3.03 cm2
Height: 72 in
S' Lateral: 3.5 cm
Weight: 4160 oz

## 2021-05-14 LAB — BLOOD CULTURE ID PANEL (REFLEXED) - BCID2

## 2021-05-14 LAB — CBC
HCT: 38.2 % — ABNORMAL LOW (ref 39.0–52.0)
Hemoglobin: 12.5 g/dL — ABNORMAL LOW (ref 13.0–17.0)
MCH: 31.1 pg (ref 26.0–34.0)
MCHC: 32.7 g/dL (ref 30.0–36.0)
MCV: 95 fL (ref 80.0–100.0)
Platelets: 135 10*3/uL — ABNORMAL LOW (ref 150–400)
RBC: 4.02 MIL/uL — ABNORMAL LOW (ref 4.22–5.81)
RDW: 12.8 % (ref 11.5–15.5)
WBC: 12.7 10*3/uL — ABNORMAL HIGH (ref 4.0–10.5)
nRBC: 0 % (ref 0.0–0.2)

## 2021-05-14 LAB — PROCALCITONIN: Procalcitonin: 1.24 ng/mL

## 2021-05-14 LAB — MAGNESIUM: Magnesium: 2.3 mg/dL (ref 1.7–2.4)

## 2021-05-14 LAB — C-REACTIVE PROTEIN: CRP: 5.9 mg/dL — ABNORMAL HIGH (ref <1.0)

## 2021-05-14 LAB — GLUCOSE, CAPILLARY
Glucose-Capillary: 217 mg/dL — ABNORMAL HIGH (ref 70–99)
Glucose-Capillary: 178 mg/dL — ABNORMAL HIGH (ref 70–99)
Glucose-Capillary: 121 mg/dL — ABNORMAL HIGH (ref 70–99)
Glucose-Capillary: 216 mg/dL — ABNORMAL HIGH (ref 70–99)

## 2021-05-14 LAB — FERRITIN: Ferritin: 78 ng/mL (ref 24–336)

## 2021-05-14 LAB — D-DIMER, QUANTITATIVE: D-Dimer, Quant: 4.8 ug/mL-FEU — ABNORMAL HIGH (ref 0.00–0.50)

## 2021-05-14 LAB — HEPARIN LEVEL (UNFRACTIONATED): Heparin Unfractionated: 0.45 IU/mL (ref 0.30–0.70)

## 2021-05-14 LAB — PHOSPHORUS: Phosphorus: 4.2 mg/dL (ref 2.5–4.6)

## 2021-05-14 MED ORDER — IOHEXOL 300 MG/ML  SOLN
100.0000 mL | Freq: Once | INTRAMUSCULAR | Status: AC | PRN
  Administered 2021-05-14: 100 mL via INTRAVENOUS

## 2021-05-14 MED ORDER — ORAL CARE MOUTH RINSE
15.0000 mL | Freq: Two times a day (BID) | OROMUCOSAL | Status: DC
  Administered 2021-05-14 – 2021-05-15 (×3): 15 mL via OROMUCOSAL

## 2021-05-14 MED ORDER — IOHEXOL 9 MG/ML PO SOLN
ORAL | Status: AC
  Administered 2021-05-14: 500 mL
  Filled 2021-05-14: qty 1000

## 2021-05-14 MED ORDER — SODIUM CHLORIDE 0.9 % IV SOLN
2.0000 g | Freq: Three times a day (TID) | INTRAVENOUS | Status: DC
  Administered 2021-05-14 – 2021-05-16 (×6): 2 g via INTRAVENOUS
  Filled 2021-05-14 (×6): qty 2

## 2021-05-14 MED ORDER — CHLORHEXIDINE GLUCONATE 0.12 % MT SOLN
15.0000 mL | Freq: Two times a day (BID) | OROMUCOSAL | Status: DC
  Administered 2021-05-14 – 2021-05-15 (×4): 15 mL via OROMUCOSAL
  Filled 2021-05-14 (×4): qty 15

## 2021-05-14 MED ORDER — INSULIN GLARGINE-YFGN 100 UNIT/ML ~~LOC~~ SOLN
5.0000 [IU] | Freq: Every day | SUBCUTANEOUS | Status: DC
  Administered 2021-05-14 – 2021-05-15 (×2): 5 [IU] via SUBCUTANEOUS
  Filled 2021-05-14 (×4): qty 0.05

## 2021-05-14 NOTE — Progress Notes (Signed)
Ct C/A/P IMPRESSION: 1. No acute cardiopulmonary process. 2. Mild cardiomegaly. 3. Minimal secretions in the trachea. 4. The tip of the appendix is prominent in size without surrounding inflammation. Please correlate clinically for tip appendicitis. Small appendiceal mass could have a similar appearance. 5. Sigmoid colon diverticulosis without evidence for acute diverticulitis. 6. Small indeterminate hypodense lesion in the pancreatic body, unchanged. This can be further evaluated with MRI.  Leg dopplers pending; remain on heparin until finalized.   Recommend further assessment of pancreas and appendix later this admission vs as an outpatient with PCP.   Julian Hy, DO 05/14/21 4:04 PM [facility]

## 2021-05-14 NOTE — Progress Notes (Signed)
Lower extremity venous has been completed.   Preliminary results in CV Proc.   Cristyn Crossno Olamide Lahaie 05/14/2021 2:07 PM

## 2021-05-14 NOTE — Progress Notes (Signed)
NAME:  Arthur Lutz, MRN:  [id number], DOB:  30-May-1941, LOS: 1 ADMISSION DATE:  05/12/2021, CONSULTATION DATE:  05/13/21 REFERRING MD:  Erlinda Hong, CHIEF COMPLAINT:  Confusion, dyspnea   History of Present Illness:  23yM with history of CAD, HTN, DM2, venous stasis who presented to ED 10/15 for CP. Had brief bout of diarrhea/vomiting earlier in week, later developed gradually worsening CP. Found to have covid-19 and started on solumedrol (pt declined remdesivir), maintenance fluids. Workup here has been unrevealing otherwise including V/Q scan.  Today we were called for abrupt onset of increased work of breathing, desaturation prompting increase in O2 from 3L to 6L, and encephalopathy and rapid increase in temp to 104.7.  Pertinent  Medical History  CAD Obesity HTN Prior covid-19 infection ?Biliary dyskinesia  Significant Hospital Events: Including procedures, antibiotic start and stop dates in addition to other pertinent events   10//15 admitted with covid pneumonia 10/16 moved to ICU for increased WOB and O2 requirements with high fever   Interim History / Subjective:  Tmax overnight 100.4 since yesterday afternoon's fever. Today has mild R hip pain he attributes to arthritis. Denies new complaints. Daughter at bedside.  Objective   Blood pressure (!) 150/65, pulse (!) 49, temperature 97.7 F (36.5 C), temperature source Bladder, resp. rate 19, height 6' (1.829 m), weight 117.9 kg, SpO2 96 %.        Intake/Output Summary (Last 24 hours) at 05/14/2021 0947 Last data filed at 05/14/2021 0400 Gross per 24 hour  Intake 1982.81 ml  Output 1525 ml  Net 457.81 ml    Filed Weights   05/13/21 1700  Weight: 117.9 kg    Examination: General appearance: healthy appearing man sitting up in bed talking to his daughter Eyes: eyes anicteric HENT: [place]/AT, Lungs: faint rhales, no tachypnea or conversational dyspnea, no accessory muscle use CV: S1S2, sinus brady, irreg rhythm but sinus  rhythm on tele Abdomen: obese, soft, NT Extremities: faint LE edema, no cyanosis or clubbing Skin: warm, dry, no rashes Psych: cooperative with exam, appropriate affect Neuro: awake and alert, normal speech, answering questions appropriately, moving all extremities GU: foley catheter draining yellow urine  BUN 25 Cr 1.28 BG >200 WBC 12.7 D-dimer 4.8 Pneumococcal antigen negative VQ personally reviewed> low probability for PE, no large perfusion defects.  Resolved Hospital Problem list     Assessment & Plan:   Acute metabolic encephalopathy- resolved, thought to be due to fevers Hyperthermia- fevers from covid -tylenol PRN -avoid NSAIDs with recent AKI  Possible sepsis -- low suspicion for bacterial infection. Likely SIRS sx were related to viral illness, but abdominal distension, nausea/vomiting, abdominal pain raise possibility of obstruction or intraabdominal infection. Also has history of ?biliary dyskinesia but [place] ok here.  -1 more day of antibiotics to wait on cultures and imaging, then would consider stopping -CT C/A/P today -stop vanc with negative MRSA swab -con't cefepime and flagyl for now   Acute hypoxic respiratory failure 2/2 covid pneumonia, no evidence of PE -Weaning down O2-- turned down to 3L  today -Recommended remdesivir and consideration of baricitinib if no bacterial infection. He is considering remdesivir and said no to other meds. Educated on potential benefits. -echo pending  AKI, improving -strict I/Os -renally dose meds, avoid nephrotoxic meds -holding lasix, aldactone, and lisinopril  H/o CAD Chest pain - cardiology following- appreciate their assistance - has been on heparin gtt awaiting US DVT results - continue coreg, statin & zocor, Imdur -holding lasix, spironolactone, lisinopril  DM with  uncontrolled hyperglycemia - con't SSI PRN -adding lanus 5 units daily -goal BG 140-180  Remove foley today. Stable to transfer back to med  tele floor. TRH to assume care tomorrow and PCCM will sign off. Daughter updated at bedside during rounds.  Best Practice (right click and "Reselect all SmartList Selections" daily)   Diet/type: Regular consistency (see orders) DVT prophylaxis: systemic heparin GI prophylaxis: H2B Lines: N/A Foley:  Yes, and it is no longer needed and removal ordered   Code Status:  full code Last date of multidisciplinary goals of care discussion [11/16 with daughter and wife - willing to allow time-limited trial of ventilation if he were to need it and ok with CPR. Would not want long term ventilation or life support measures.]  Labs   CBC: Recent Labs  Lab 05/12/21 1041 05/13/21 0228 05/13/21 1442 05/13/21 1751 05/14/21 0749  WBC 6.8 6.5 10.8*  --  12.7*  NEUTROABS 4.8  --  9.7*  --   --   HGB 13.4 12.5* 13.3 12.6* 12.5*  HCT 41.9 37.9* 40.8 37.0* 38.2*  MCV 96.3 95.0 93.6  --  95.0  PLT 135* 124* 144*  --  135*     Basic Metabolic Panel: Recent Labs  Lab 05/12/21 1041 05/13/21 0228 05/13/21 1442 05/13/21 1751 05/14/21 0749  NA 142 140 139 140 136  K 3.8 3.9 4.3 3.5 4.0  CL 104 104 103  --  104  CO2 28 27 24   --  26  GLUCOSE 126* 176* 160*  --  215*  BUN 19 20 24*  --  25*  CREATININE 1.71* 1.63* 1.71*  --  1.28*  CALCIUM 9.3 8.9 9.1  --  8.7*  MG  --  2.1  --   --  2.3  PHOS  --  3.4  --   --  4.2    GFR: Estimated Creatinine Clearance: 64.1 mL/min (A) (by C-G formula based on SCr of 1.28 mg/dL (H)). Recent Labs  Lab 05/12/21 1041 05/13/21 0228 05/13/21 1442 05/13/21 1507 05/13/21 1702 05/14/21 0749  PROCALCITON  --   --   --  <0.10  --   --   WBC 6.8 6.5 10.8*  --   --  12.7*  LATICACIDVEN  --   --  2.1*  --  1.1  --       Julian Hy, DO 05/14/21 11:05 AM  Pulmonary & Critical Care

## 2021-05-14 NOTE — Progress Notes (Signed)
Pharmacy Antibiotic Note  Arthur Lutz is a 80 y.o. male admitted on 05/12/2021 with chest pain and abdominal pain. Patient spiked a fever, became confused, and developed hypoxia on progressive care unit. New orders to transfer to ICU and start broad spectrum antibiotics with vancomycin and cefepime.   MRSA PCR negative, Cr improved   Plan: Stop vancomycin Adjust cefepime to 2g q8h   Height: 6' (182.9 cm) Weight: 117.9 kg (260 lb) IBW/kg (Calculated) : 77.6  Temp (24hrs), Avg:99.9 F (37.7 C), Min:97.5 F (36.4 C), Max:104.6 F (40.3 C)  Recent Labs  Lab 05/12/21 1041 05/13/21 0228 05/13/21 1442 05/13/21 1702 05/14/21 0749  WBC 6.8 6.5 10.8*  --  12.7*  CREATININE 1.71* 1.63* 1.71*  --  1.28*  LATICACIDVEN  --   --  2.1* 1.1  --      Estimated Creatinine Clearance: 64.1 mL/min (A) (by C-G formula based on SCr of 1.28 mg/dL (H)).    Allergies  Allergen Reactions   Aspirin Other (See Comments)    Was told to not take this due to having macular degeneration   Rosuvastatin Calcium Other (See Comments)    Aches and cramps   Other Rash and Other (See Comments)    Red meat = develops a rash on the back   Thank you for allowing pharmacy to be a part of this patient's care.   Arrie Senate, PharmD, BCPS, [facility] Clinical Pharmacist [phone] Please check AMION for all [place] numbers 05/14/2021

## 2021-05-14 NOTE — Progress Notes (Signed)
ANTICOAGULATION CONSULT NOTE - Follow Up Consult  Pharmacy Consult for heparin Indication:  r/o VTE  Labs: Recent Labs    05/12/21 1041 05/12/21 1041 05/12/21 1235 05/13/21 0228 05/13/21 1058 05/13/21 1442 05/13/21 1702 05/13/21 1751 05/13/21 1905 05/14/21 0749  HGB 13.4  --   --  12.5*  --  13.3  --  12.6*  --  12.5*  HCT 41.9  --   --  37.9*  --  40.8  --  37.0*  --  38.2*  PLT 135*  --   --  124*  --  144*  --   --   --  135*  APTT  --   --   --   --   --  40*  --   --   --   --   LABPROT  --   --   --   --   --  14.5  --   --   --   --   INR  --   --   --   --   --  1.1  --   --   --   --   HEPARINUNFRC  --    < >  --  <0.10* 0.35  --   --   --  0.22* 0.45  CREATININE 1.71*  --   --  1.63*  --  1.71*  --   --   --  1.28*  CKTOTAL  --   --   --   --   --   --  35*  --   --   --   TROPONINIHS 40*  --  40*  --   --   --   --   --   --   --    < > = values in this interval not displayed.    Assessment: 80yo male subtherapeutic on heparin with initial dosing for D-dimer >20 in setting of Covid; VQ scan negative, but continuing work-up with doppler scan. Pharmacy asked to dose heparin.  Heparin level is therapeutic, CBC stable, D-dimer down but still elevated. Dopplers remain pending.  Goal of Therapy:  Heparin level 0.3-0.7 units/ml   Plan:  Continue heparin 2000 units/h Daily heparin level and CBC Dopplers today  Arrie Senate, PharmD, BCPS, [facility] Clinical Pharmacist [phone] Please check AMION for all [place] numbers 05/14/2021

## 2021-05-14 NOTE — Progress Notes (Signed)
  Echocardiogram 2D Echocardiogram has been performed.  Arthur Lutz F 05/14/2021, 4:49 PM

## 2021-05-14 NOTE — Plan of Care (Signed)
  Problem: Clinical Measurements: Goal: Diagnostic test results will improve Outcome: Progressing   Problem: Nutrition: Goal: Adequate nutrition will be maintained Outcome: Progressing   Problem: Coping: Goal: Level of anxiety will decrease Outcome: Progressing   Problem: Elimination: Goal: Will not experience complications related to urinary retention Outcome: Progressing   Problem: Pain Managment: Goal: General experience of comfort will improve Outcome: Progressing   Problem: Skin Integrity: Goal: Risk for impaired skin integrity will decrease Outcome: Progressing

## 2021-05-14 NOTE — Progress Notes (Signed)
PHARMACY - PHYSICIAN COMMUNICATION CRITICAL VALUE ALERT - BLOOD CULTURE IDENTIFICATION (BCID)  Arthur Lutz is an 80 y.o. male who presented to [facility] on 05/12/2021  Assessment:  65 yom presenting COVID-19+ with possible sepsis due to intra-abdominal infection, currently on cefepime and Flagyl. Patient now with 1 of 2 BCx bottles (aerobic) growing staph epi with methicillin resistance.  Name of physician (or Provider) ContactedCarlis Abbott, L  Current antibiotics: cefepime, Flagyl  Changes to prescribed antibiotics recommended:  None - likelly contaminant  Results for orders placed or performed during the hospital encounter of 05/12/21  Blood Culture ID Panel (Reflexed) (Collected: 05/13/2021  2:42 PM)  Result Value Ref Range   Enterococcus faecalis NOT DETECTED NOT DETECTED   Enterococcus Faecium NOT DETECTED NOT DETECTED   Listeria monocytogenes NOT DETECTED NOT DETECTED   Staphylococcus species DETECTED (A) NOT DETECTED   Staphylococcus aureus (BCID) NOT DETECTED NOT DETECTED   Staphylococcus epidermidis DETECTED (A) NOT DETECTED   Staphylococcus lugdunensis NOT DETECTED NOT DETECTED   Streptococcus species NOT DETECTED NOT DETECTED   Streptococcus agalactiae NOT DETECTED NOT DETECTED   Streptococcus pneumoniae NOT DETECTED NOT DETECTED   Streptococcus pyogenes NOT DETECTED NOT DETECTED   A.calcoaceticus-baumannii NOT DETECTED NOT DETECTED   Bacteroides fragilis NOT DETECTED NOT DETECTED   Enterobacterales NOT DETECTED NOT DETECTED   Enterobacter cloacae complex NOT DETECTED NOT DETECTED   Escherichia coli NOT DETECTED NOT DETECTED   Klebsiella aerogenes NOT DETECTED NOT DETECTED   Klebsiella oxytoca NOT DETECTED NOT DETECTED   Klebsiella pneumoniae NOT DETECTED NOT DETECTED   Proteus species NOT DETECTED NOT DETECTED   Salmonella species NOT DETECTED NOT DETECTED   Serratia marcescens NOT DETECTED NOT DETECTED   Haemophilus influenzae NOT DETECTED NOT DETECTED    Neisseria meningitidis NOT DETECTED NOT DETECTED   Pseudomonas aeruginosa NOT DETECTED NOT DETECTED   Stenotrophomonas maltophilia NOT DETECTED NOT DETECTED   Candida albicans NOT DETECTED NOT DETECTED   Candida auris NOT DETECTED NOT DETECTED   Candida glabrata NOT DETECTED NOT DETECTED   Candida krusei NOT DETECTED NOT DETECTED   Candida parapsilosis NOT DETECTED NOT DETECTED   Candida tropicalis NOT DETECTED NOT DETECTED   Cryptococcus neoformans/gattii NOT DETECTED NOT DETECTED   Methicillin resistance mecA/C DETECTED (A) NOT DETECTED    Arturo Morton, PharmD, BCPS Please check AMION for all [facility] contact numbers Clinical Pharmacist 05/14/2021 7:15 PM

## 2021-05-14 NOTE — Progress Notes (Signed)
Progress Note  Patient Name: Arthur Lutz Date of Encounter: 05/14/2021  [facility] Cardiologist: Glenetta Hew, MD   Subjective   No acute events. Breathing is stable, no further chest pain. Echo being done at bedside.  Inpatient Medications    Scheduled Meds:  vitamin C  1,000 mg Oral BID   azelastine  1 spray Each Nare BID   carvedilol  1.5625 mg Oral Daily   chlorhexidine  15 mL Mouth Rinse BID   Chlorhexidine Gluconate Cloth  6 each Topical Daily   cholecalciferol  5,000 Units Oral Daily   ezetimibe  10 mg Oral Daily   famotidine  20 mg Oral BID   insulin aspart  0-9 Units Subcutaneous TID WC   isosorbide mononitrate  60 mg Oral Daily   magnesium gluconate  500 mg Oral QHS   mouth rinse  15 mL Mouth Rinse q12n4p   methylPREDNISolone (SOLU-MEDROL) injection  60 mg Intravenous Q24H   multivitamin  1 tablet Oral BID   simvastatin  20 mg Oral QHS   triazolam  0.1875 mg Oral QHS   zinc sulfate  220 mg Oral Daily   Continuous Infusions:  ceFEPime (MAXIPIME) IV Stopped (05/13/21 2330)   heparin 2,000 Units/hr (05/14/21 0400)   metronidazole 500 mg (05/14/21 0505)   vancomycin     PRN Meds: acetaminophen, albuterol, fluticasone, hydrALAZINE, nitroGLYCERIN, ondansetron (ZOFRAN) IV   Vital Signs    Vitals:   05/14/21 0300 05/14/21 0400 05/14/21 0500 05/14/21 0600  BP: 132/70 135/74 120/63 122/62  Pulse:    (!) 59  Resp: 20 19 19    Temp:  97.9 F (36.6 C) 97.7 F (36.5 C)   TempSrc:  Bladder Bladder   SpO2: 97% 94% (!) 88% 92%  Weight:      Height:        Intake/Output Summary (Last 24 hours) at 05/14/2021 0850 Last data filed at 05/14/2021 0400 Gross per 24 hour  Intake 1982.81 ml  Output 1525 ml  Net 457.81 ml   Last 3 Weights 05/13/2021 03/12/2021 02/02/2021  Weight (lbs) 260 lb 260 lb 3.2 oz 251 lb  Weight (kg) 117.935 kg 118.026 kg 113.853 kg      Telemetry    NSR - Personally Reviewed  ECG    NSR, RBBB - Personally Reviewed  Physical  Exam   GEN: No acute distress.   Neck: No JVD Cardiac: RRR, no murmurs, rubs, or gallops.  Respiratory: Clear to auscultation bilaterally. GI: Soft, nontender, non-distended  MS: No edema; No deformity. Neuro:  Nonfocal  Psych: Normal affect   Labs    High Sensitivity Troponin:   Recent Labs  Lab 05/12/21 1041 05/12/21 1235  TROPONINIHS 40* 40*     Chemistry Recent Labs  Lab 05/12/21 1041 05/13/21 0228 05/13/21 1442 05/13/21 1751  NA 142 140 139 140  K 3.8 3.9 4.3 3.5  CL 104 104 103  --   CO2 28 27 24   --   GLUCOSE 126* 176* 160*  --   BUN 19 20 24*  --   CREATININE 1.71* 1.63* 1.71*  --   CALCIUM 9.3 8.9 9.1  --   MG  --  2.1  --   --   PROT 6.5 5.8* 6.4*  --   ALBUMIN 3.9 3.5 3.7  --   AST 18 13* 18  --   ALT 18 15 15   --   ALKPHOS 65 57 63  --   BILITOT 1.2 1.2 1.2  --  GFRNONAA 41* 43* 41*  --   ANIONGAP 10 9 12   --     Lipids No results for input(s): CHOL, TRIG, HDL, LABVLDL, LDLCALC, CHOLHDL in the last 168 hours.  Hematology Recent Labs  Lab 05/12/21 1041 05/13/21 0228 05/13/21 1442 05/13/21 1751  WBC 6.8 6.5 10.8*  --   RBC 4.35 3.99* 4.36  --   HGB 13.4 12.5* 13.3 12.6*  HCT 41.9 37.9* 40.8 37.0*  MCV 96.3 95.0 93.6  --   MCH 30.8 31.3 30.5  --   MCHC 32.0 33.0 32.6  --   RDW 13.0 13.0 12.8  --   PLT 135* 124* 144*  --    Thyroid No results for input(s): TSH, FREET4 in the last 168 hours.  BNP Recent Labs  Lab 05/12/21 1513  BNP 8.9    DDimer  Recent Labs  Lab 05/12/21 1450 05/13/21 0228  DDIMER >20.00* >20.00*     Radiology    DG Chest 2 View  Result Date: 05/12/2021 CLINICAL DATA:  Chest pain EXAM: CHEST - 2 VIEW COMPARISON:  Chest radiograph 11/27/2020 FINDINGS: The left chest wall port is in stable position. The cardiomediastinal silhouette is stable. There is unchanged asymmetric elevation of the right hemidiaphragm. Linear opacities projecting over the right base likely reflect atelectasis and/or scar. There is no  focal consolidation. There is no pulmonary edema. There is no pleural effusion or pneumothorax. There is no acute osseous abnormality. IMPRESSION: Right basilar atelectasis and/or scar. Otherwise, no radiographic evidence of acute cardiopulmonary process. Electronically Signed   By: Valetta Mole M.D.   On: 05/12/2021 11:22   NM Pulmonary Perfusion  Result Date: 05/13/2021 CLINICAL DATA:  PE suspected.  Positive D-dimer. EXAM: NUCLEAR MEDICINE PERFUSION LUNG SCAN TECHNIQUE: Perfusion images were obtained in multiple projections after intravenous injection of radiopharmaceutical. Ventilation scans intentionally deferred if perfusion scan and chest x-ray adequate for interpretation during COVID 19 epidemic. RADIOPHARMACEUTICALS:  4.4 millicuries mCi KC-12X MAA IV COMPARISON:  None. FINDINGS: Appropriate radiotracer uptake throughout both lungs. No perfusion defect seen to suggest pulmonary embolism. IMPRESSION: No evidence of pulmonary embolism. Electronically Signed   By: Franki Cabot M.D.   On: 05/13/2021 09:46   DG CHEST PORT 1 VIEW  Result Date: 05/13/2021 CLINICAL DATA:  COVID positive, hypoxia EXAM: PORTABLE CHEST 1 VIEW COMPARISON:  05/12/2021 FINDINGS: Left Port-A-Cath remains in place, unchanged. Cardiomegaly, aortic atherosclerosis. Right base atelectasis or scarring, stable. Left lung clear. No effusions or acute bony abnormality. IMPRESSION: Right base atelectasis or scarring, stable. No active disease. Electronically Signed   By: Rolm Baptise M.D.   On: 05/13/2021 17:13   US Abdomen Limited RUQ (LIVER/GB)  Result Date: 05/12/2021 CLINICAL DATA:  Right upper quadrant pain EXAM: ULTRASOUND ABDOMEN LIMITED RIGHT UPPER QUADRANT COMPARISON:  None. FINDINGS: Gallbladder: No gallstones or wall thickening visualized. No sonographic Murphy sign noted by sonographer. Common bile duct: Diameter: 0.5 cm, within normal limits Liver: Multiple cystic masses noted in the left hepatic lobe, as seen on prior  CT. The largest measures 3.8 x 2.9 x 4.8 cm. No suspicious solid mass. Within normal limits in parenchymal echogenicity. Portal vein is patent on color Doppler imaging with normal direction of blood flow towards the liver. Other: None. IMPRESSION: No sonographic finding to explain the patient's acute right upper quadrant pain. Electronically Signed   By: Audie Pinto M.D.   On: 05/12/2021 16:40    Cardiac Studies   Echo being done at bedside.  Patient Profile  80 y.o. male with PMH CAD presenting with chest pain, abdominal pain in the setting of Covid infection  Assessment & Plan    CAD with prior PCI Hypercholesterolemia -continue ezetimibe, simvastatin -continue carvedilol, imdur -hsTnI 40, 40 -echo being performed, on initial review EF/wall motion appears normal -would consider outpatient nuclear stress test if he remains asymptomatic  Chest pain Covid infection -Covid treatment per primary team  Hypertension Type II diabetes -holding spironolactone, lisinopril, lasix with acute kidney injury -Cr improving -on insulin as an outpatient. Consider SGLT2i   For questions or updates, please contact [place] Please consult [email or web address] for contact info under        Signed, Buford Dresser, MD  05/14/2021, 8:50 AM

## 2021-05-14 NOTE — Progress Notes (Signed)
Inpatient Diabetes Program Recommendations  AACE/ADA: New Consensus Statement on Inpatient Glycemic Control (2015)  Target Ranges:  Prepandial:   less than 140 mg/dL      Peak postprandial:   less than 180 mg/dL (1-2 hours)      Critically ill patients:  140 - 180 mg/dL   Lab Results  Component Value Date   GLUCAP 217 (H) 05/14/2021   HGBA1C 6.9 (H) 05/13/2021    Review of Glycemic Control Results for Arthur Lutz, Arthur Lutz" (MRN [id number]) as of 05/14/2021 10:07  Ref. Range 05/13/2021 07:41 05/13/2021 11:48 05/13/2021 17:44 05/13/2021 23:07 05/14/2021 06:58  Glucose-Capillary Latest Ref Range: 70 - 99 mg/dL 211 (H) 272 (H) 163 (H) 227 (H) 217 (H)   Diabetes history: DM 2 Outpatient Diabetes medications:  Metformin 500 mg daily Current orders for Inpatient glycemic control:  Novolog sensitive tid with meals  Semglee 5 units daily Solumedrol 60 mg IV q 24 hours Inpatient Diabetes Program Recommendations:   Please consider increasing Novolog correction to moderate q 4 hours and increase Semglee to 10 units daily.   Thanks,  Adah Perl, RN, BC-ADM Inpatient Diabetes Coordinator Pager [phone]  (8a-5p)

## 2021-05-15 DIAGNOSIS — R079 Chest pain, unspecified: Secondary | ICD-10-CM | POA: Diagnosis not present

## 2021-05-15 LAB — CBC
HCT: 36.1 % — ABNORMAL LOW (ref 39.0–52.0)
Hemoglobin: 12.2 g/dL — ABNORMAL LOW (ref 13.0–17.0)
MCH: 31.6 pg (ref 26.0–34.0)
MCHC: 33.8 g/dL (ref 30.0–36.0)
MCV: 93.5 fL (ref 80.0–100.0)
Platelets: 123 10*3/uL — ABNORMAL LOW (ref 150–400)
RBC: 3.86 MIL/uL — ABNORMAL LOW (ref 4.22–5.81)
RDW: 13.1 % (ref 11.5–15.5)
WBC: 9.6 10*3/uL (ref 4.0–10.5)
nRBC: 0 % (ref 0.0–0.2)

## 2021-05-15 LAB — COMPREHENSIVE METABOLIC PANEL
ALT: 15 U/L (ref 0–44)
AST: 18 U/L (ref 15–41)
Albumin: 3 g/dL — ABNORMAL LOW (ref 3.5–5.0)
Alkaline Phosphatase: 58 U/L (ref 38–126)
Anion gap: 7 (ref 5–15)
BUN: 23 mg/dL (ref 8–23)
CO2: 26 mmol/L (ref 22–32)
Calcium: 8.7 mg/dL — ABNORMAL LOW (ref 8.9–10.3)
Chloride: 104 mmol/L (ref 98–111)
Creatinine, Ser: 0.97 mg/dL (ref 0.61–1.24)
GFR, Estimated: >60 mL/min (ref >60)
Glucose, Bld: 190 mg/dL — ABNORMAL HIGH (ref 70–99)
Potassium: 4.5 mmol/L (ref 3.5–5.1)
Sodium: 137 mmol/L (ref 135–145)
Total Bilirubin: 0.8 mg/dL (ref 0.3–1.2)
Total Protein: 5.8 g/dL — ABNORMAL LOW (ref 6.5–8.1)

## 2021-05-15 LAB — MAGNESIUM: Magnesium: 2.4 mg/dL (ref 1.7–2.4)

## 2021-05-15 LAB — D-DIMER, QUANTITATIVE: D-Dimer, Quant: 2.61 ug/mL-FEU — ABNORMAL HIGH (ref 0.00–0.50)

## 2021-05-15 LAB — GLUCOSE, CAPILLARY
Glucose-Capillary: 174 mg/dL — ABNORMAL HIGH (ref 70–99)
Glucose-Capillary: 172 mg/dL — ABNORMAL HIGH (ref 70–99)
Glucose-Capillary: 108 mg/dL — ABNORMAL HIGH (ref 70–99)
Glucose-Capillary: 163 mg/dL — ABNORMAL HIGH (ref 70–99)

## 2021-05-15 LAB — PHOSPHORUS: Phosphorus: 3.8 mg/dL (ref 2.5–4.6)

## 2021-05-15 LAB — FERRITIN: Ferritin: 66 ng/mL (ref 24–336)

## 2021-05-15 LAB — PROCALCITONIN: Procalcitonin: 0.72 ng/mL

## 2021-05-15 LAB — LEGIONELLA PNEUMOPHILA SEROGP 1 UR AG: L. pneumophila Serogp 1 Ur Ag: NEGATIVE

## 2021-05-15 LAB — HEPARIN LEVEL (UNFRACTIONATED): Heparin Unfractionated: 0.32 IU/mL (ref 0.30–0.70)

## 2021-05-15 LAB — C-REACTIVE PROTEIN: CRP: 3.5 mg/dL — ABNORMAL HIGH (ref <1.0)

## 2021-05-15 MED ORDER — SENNOSIDES-DOCUSATE SODIUM 8.6-50 MG PO TABS
1.0000 | ORAL_TABLET | Freq: Every day | ORAL | Status: DC
  Administered 2021-05-15: 1 via ORAL
  Filled 2021-05-15: qty 1

## 2021-05-15 MED ORDER — SPIRONOLACTONE 25 MG PO TABS
25.0000 mg | ORAL_TABLET | Freq: Every day | ORAL | Status: DC
  Administered 2021-05-15 – 2021-05-16 (×3): 25 mg via ORAL
  Filled 2021-05-15 (×2): qty 1

## 2021-05-15 MED ORDER — BISACODYL 5 MG PO TBEC
10.0000 mg | DELAYED_RELEASE_TABLET | Freq: Once | ORAL | Status: DC
  Filled 2021-05-15: qty 2

## 2021-05-15 MED ORDER — HEPARIN SODIUM (PORCINE) 5000 UNIT/ML IJ SOLN
5000.0000 [IU] | Freq: Three times a day (TID) | INTRAMUSCULAR | Status: DC
  Administered 2021-05-15 – 2021-05-16 (×3): 5000 [IU] via SUBCUTANEOUS
  Filled 2021-05-15 (×3): qty 1

## 2021-05-15 NOTE — Progress Notes (Signed)
PROGRESS NOTE  Arthur Lutz  DOB: Sep 19, 1940  PCP: Redmond School, MD KDT:[id number]  DOA: 05/12/2021  LOS: 2 days  Hospital Day: 4   Chief Complaint  Patient presents with   Chest Pain   Brief narrative: Arthur Lutz is a 80 y.o. male with PMH significant for DM2, HTN, HLD, CAD s/p stents in 2013, venous stasis. Patient initially presented to the ED on 10/15 after he threw up all night long every 10 to 15 minutes.  For the last 2 to 3 weeks, patient was having intermittent vomiting, diarrhea as well.   In the ED, he was afebrile, blood pressure was significantly elevated to 210/84 Labs with creatinine elevated 1.71 COVID PCR positive D-dimer more than 20 Troponin was elevated to 40 Right upper quadrant ultrasound showing multiple cystic masses, knows suspicious solid mass, no gallstone Patient was admitted to hospital service for COVID-19.  He was started on steroids, he refused IV remdesivir.  On 10/17, patient to was noted to have high fever up to 104.7, increased work of breathing, hypoxia up to 6 L of oxygen requirement, encephalopathy.  Critical care was consulted and patient was transferred to ICU. With rapid clinical improvement, patient was transferred out to hospitalist service again on 10/18.  See below for details.  Subjective: Patient was seen and examined this morning.  Pleasant elderly Caucasian male.  Lying down in bed.  On low-flow oxygen.  Not in distress.  Feels better than at presentation.  Family at bedside. No fever in last 24 hours, heart rate in 50s and 60s, blood pressure in 160s mostly, requiring 2 to 3 L of oxygen to maintain saturation more than 90%.  Assessment/Plan: Severe COVID infection Acute respiratory failure with hypoxia  -Presented with vomiting, diarrhea on admission, he was COVID-positive but did not have respiratory symptoms and chest x-ray did not show any evidence of pneumonia.  After 24 hours, he had high fever, increased work of  breathing requiring transfer to ICU.  CT chest again did not show evidence of pneumonia. -On admission he was started on IV steroids.  He did not want remdesivir. -He is currently on IV Solu-Medrol 60 mg daily. -He is on 2 to 3 L oxygen by nasal cannula.  Does not use oxygen at home.  Wean down as tolerated.  Obtain ambulatory oxygen requirement. -As needed antitussives.  Incentive spirometry. -WBC and inflammatory markers trend as below.  CRP peaked at 5.9 on 10/17, better today. Recent Labs  Lab 05/12/21 1041 05/12/21 1305 05/12/21 1450 05/12/21 2102 05/13/21 0228 05/13/21 1442 05/13/21 1507 05/13/21 1702 05/14/21 0749 05/15/21 0603  SARSCOV2NAA  --  POSITIVE*  --   --   --   --   --   --   --   --   WBC 6.8  --   --   --  6.5 10.8*  --   --  12.7* 9.6  LATICACIDVEN  --   --   --   --   --  2.1*  --  1.1  --   --   PROCALCITON  --   --   --   --   --   --  <0.10  --  1.24 0.72  DDIMER  --   --  >20.00*  --  >20.00*  --   --   --  4.80* 2.61*  FERRITIN  --   --   --  42 43  --   --   --  78 66  CRP  --   --   --  2.0* 2.2*  --   --   --  5.9* 3.5*  ALT 18  --   --   --  15 15  --   --  14 15   Acute metabolic encephalopathy -Confusion, delirium due to high fever.  Resolved.Marland Kitchen  SIRS physiology Bacterial growth in blood culture -Fever, leukocytosis, confusion -all likely due to viral illness. -No evidence of bacterial infection at this time.  Blood culture sent on 10/16 is growing staph aureus, likely contaminant.  Continue empiric antibiotics for now.  AKI -Creatinine was elevated to 1.71 at presentation, improved down to normal today. -Holding Lasix and Aldactone and lisinopril. Recent Labs    08/21/20 0819 01/30/21 0819 04/04/21 0743 05/12/21 1041 05/13/21 0228 05/13/21 1442 05/14/21 0749 05/15/21 0603  BUN 14 19 16 19 20  24* 25* 23  CREATININE 0.93 0.87 0.97 1.71* 1.63* 1.71* 1.28* 0.97   Chest pain History of CAD -Cardiology following.  Currently on Coreg,  Imdur, statin and Zetia. -DVT scan was negative and hence heparin drip stopped today.  Chronic diastolic CHF Essential hypertension -Echo 10/17 with EF 55 to 48%, grade 1 diastolic dysfunction. -Home meds include low-dose Coreg at half of 3.125 mg daily, Imdur, lisinopril, Aldactone 25 mg daily, Lasix as needed -Currently on Coreg, Imdur.  On hold are lisinopril, Aldactone and Lasix.  Blood pressure elevated to 160s this morning.  I would defer to cardiology for further adjustment.  Type 2 diabetes mellitus Hyperglycemia due to steroids -A1c 6.9 on 10/16 -Home meds include metformin 500 mg daily. -Currently on Lantus 5 units daily and sliding scale insulin. -Blood sugar level elevated close to 200 this morning.  This is likely due to steroids.  Continue to monitor. Recent Labs  Lab 05/14/21 1217 05/14/21 1633 05/14/21 2201 05/15/21 0752 05/15/21 1125  GLUCAP 178* 121* 216* 174* 172*   Abnormal CT abdomen findings -10/7, CT abdomen pelvis showed multiple nonacute findings as below.  I do not think they need acute attention at this time but a repeat scan in 6 months would be helpful. -Findings were: Stable hepatic cysts, unchanged, 10 mm hypodense area in the tail of pancreas, right kidney cyst, sigmoid colon diverticulosis without diverticulitis, prominent deep of the appendix without surrounding inflammation.  No clinical evidence of appendicitis at this time. -I ordered for an ambulatory referral for general surgery appointment as an outpatient.  Mobility: Encourage ambulation.  Previously independent Code Status:   Code Status: Full Code  Nutritional status: Body mass index is 34.74 kg/m.     Diet:  Diet Order             Diet heart healthy/carb modified Room service appropriate? Yes; Fluid consistency: Thin  Diet effective now                  DVT prophylaxis:    Heparin subcu for DVT prophylaxis   Antimicrobials: Empiric antibiotics Fluid: None Consultants:  Cardiology Family Communication: Family at bedside  Status is: Inpatient  Remains inpatient appropriate because: Pending final blood culture report  Dispo: The patient is from: Home              Anticipated d/c is to: Home in 1 to 2 days              Patient currently is not medically stable to d/c.   Difficult to place patient No     Infusions:  ceFEPime (MAXIPIME) IV 2 g (05/15/21 0617)   metronidazole 500 mg (05/15/21 1540)    Scheduled Meds:  vitamin C  1,000 mg Oral BID   azelastine  1 spray Each Nare BID   bisacodyl  10 mg Oral Once   carvedilol  1.5625 mg Oral Daily   chlorhexidine  15 mL Mouth Rinse BID   Chlorhexidine Gluconate Cloth  6 each Topical Daily   cholecalciferol  5,000 Units Oral Daily   ezetimibe  10 mg Oral Daily   famotidine  20 mg Oral BID   heparin injection (subcutaneous)  5,000 Units Subcutaneous Q8H   insulin aspart  0-9 Units Subcutaneous TID WC   insulin glargine-yfgn  5 Units Subcutaneous Daily   isosorbide mononitrate  60 mg Oral Daily   magnesium gluconate  500 mg Oral QHS   mouth rinse  15 mL Mouth Rinse q12n4p   methylPREDNISolone (SOLU-MEDROL) injection  60 mg Intravenous Q24H   multivitamin  1 tablet Oral BID   senna-docusate  1 tablet Oral QHS   simvastatin  20 mg Oral QHS   triazolam  0.1875 mg Oral QHS   zinc sulfate  220 mg Oral Daily    Antimicrobials: Anti-infectives (From admission, onward)    Start     Dose/Rate Route Frequency Ordered Stop   05/14/21 2000  vancomycin (VANCOREADY) IVPB 1250 mg/250 mL  Status:  Discontinued        1,250 mg 166.7 mL/hr over 90 Minutes Intravenous Every 24 hours 05/13/21 1740 05/13/21 1743   05/14/21 2000  vancomycin (VANCOREADY) IVPB 1750 mg/350 mL  Status:  Discontinued        1,750 mg 175 mL/hr over 120 Minutes Intravenous Every 36 hours 05/13/21 1743 05/14/21 1042   05/14/21 1400  ceFEPIme (MAXIPIME) 2 g in sodium chloride 0.9 % 100 mL IVPB        2 g 200 mL/hr over 30 Minutes  Intravenous Every 8 hours 05/14/21 1047     05/13/21 1930  vancomycin (VANCOCIN) 2,500 mg in sodium chloride 0.9 % 500 mL IVPB        2,500 mg 250 mL/hr over 120 Minutes Intravenous  Once 05/13/21 1740 05/13/21 2230   05/13/21 1830  ceFEPIme (MAXIPIME) 2 g in sodium chloride 0.9 % 100 mL IVPB  Status:  Discontinued        2 g 200 mL/hr over 30 Minutes Intravenous Every 12 hours 05/13/21 1740 05/14/21 1047   05/13/21 1745  metroNIDAZOLE (FLAGYL) IVPB 500 mg        500 mg 100 mL/hr over 60 Minutes Intravenous Every 12 hours 05/13/21 1647         PRN meds: acetaminophen, albuterol, fluticasone, hydrALAZINE, nitroGLYCERIN, ondansetron (ZOFRAN) IV   Objective: Vitals:   05/15/21 0753 05/15/21 1127  BP: (!) 160/72   Pulse: (!) 50   Resp: 18   Temp: 97.7 F (36.5 C) (P) 98.2 F (36.8 C)  SpO2: 94% 92%    Intake/Output Summary (Last 24 hours) at 05/15/2021 1129 Last data filed at 05/15/2021 0441 Gross per 24 hour  Intake 250 ml  Output 600 ml  Net -350 ml   Filed Weights   05/13/21 1700 05/15/21 0313  Weight: 117.9 kg 116.2 kg   Weight change: -1.735 kg Body mass index is 34.74 kg/m.   Physical Exam: General exam: Pleasant, elderly Caucasian male.  Not in physical distress Skin: No rashes, lesions or ulcers. HEENT: Atraumatic, normocephalic, no obvious bleeding Lungs: Clear to auscultation bilaterally CVS:  Regular rate and rhythm, no murmur GI/Abd soft, nontender, nondistended, bowel sound present CNS: Alert, awake, oriented x3 Psychiatry: Mood appropriate Extremities: No pedal edema, no calf tenderness  Data Review: I have personally reviewed the laboratory data and studies available.  Recent Labs  Lab 05/12/21 1041 05/13/21 0228 05/13/21 1442 05/13/21 1751 05/14/21 0749 05/15/21 0603  WBC 6.8 6.5 10.8*  --  12.7* 9.6  NEUTROABS 4.8  --  9.7*  --   --   --   HGB 13.4 12.5* 13.3 12.6* 12.5* 12.2*  HCT 41.9 37.9* 40.8 37.0* 38.2* 36.1*  MCV 96.3 95.0 93.6   --  95.0 93.5  PLT 135* 124* 144*  --  135* 123*   Recent Labs  Lab 05/12/21 1041 05/13/21 0228 05/13/21 1442 05/13/21 1751 05/14/21 0749 05/15/21 0603  NA 142 140 139 140 136 137  K 3.8 3.9 4.3 3.5 4.0 4.5  CL 104 104 103  --  104 104  CO2 28 27 24   --  26 26  GLUCOSE 126* 176* 160*  --  215* 190*  BUN 19 20 24*  --  25* 23  CREATININE 1.71* 1.63* 1.71*  --  1.28* 0.97  CALCIUM 9.3 8.9 9.1  --  8.7* 8.7*  MG  --  2.1  --   --  2.3 2.4  PHOS  --  3.4  --   --  4.2 3.8    F/u labs ordered Unresulted Labs (From admission, onward)     Start     Ordered   05/15/21 0500  Heparin level (unfractionated)  Daily,   R     Question:  Specimen collection method  Answer:  Lab=Lab collect   05/13/21 2054   05/13/21 2134  Legionella Pneumophila Serogp 1 Ur Ag  Once,   R        05/13/21 2134   05/13/21 1535  Blood gas, arterial  ONCE - STAT,   STAT        05/13/21 1534   05/13/21 0500  CBC  Daily,   R      05/12/21 1711   05/13/21 0500  Comprehensive metabolic panel  Daily,   R      05/12/21 2046   05/13/21 0500  C-reactive protein  Daily,   R      05/12/21 2046   05/13/21 0500  D-dimer, quantitative  Daily,   R      05/12/21 2046   05/13/21 0500  Ferritin  Daily,   R      05/12/21 2046   05/13/21 0500  Magnesium  Daily,   R      05/12/21 2046   05/13/21 0500  Phosphorus  Daily,   R      05/12/21 2046            Signed, Terrilee Croak, MD [facility] 05/15/2021

## 2021-05-15 NOTE — Progress Notes (Signed)
Brief cardiology follow up note:  Echo reviewed, reassuring. Renal function improving. Added back spironolactone given elevated blood pressure. Carvedilol held due to bradycardia into the 40s, asymptomatic.   [facility] will sign off.   Medication Recommendations:   CONTINUE Ezetimibe Imdur, increased to 60 mg this admission Simvastatin Spironolactone  Restart home lisinopril 20 mg if renal function remains stable  STOP  Carvedilol  Consider adding SGLT2i as an outpatient if renal function stable Other recommendations (labs, testing, etc):  none Follow up as an outpatient:  He has appt scheduled with Dr. Ellyn Hack 10/24; would keep this appt, can discuss changing to virtual if he is symptomatic  Buford Dresser, MD, PhD, [facility] at [facility] [address], [place], [place] [address] [phone]

## 2021-05-16 DIAGNOSIS — R079 Chest pain, unspecified: Secondary | ICD-10-CM | POA: Diagnosis not present

## 2021-05-16 LAB — CULTURE, BLOOD (ROUTINE X 2): Special Requests: ADEQUATE

## 2021-05-16 LAB — COMPREHENSIVE METABOLIC PANEL
ALT: 28 U/L (ref 0–44)
AST: 23 U/L (ref 15–41)
Albumin: 3.3 g/dL — ABNORMAL LOW (ref 3.5–5.0)
Alkaline Phosphatase: 60 U/L (ref 38–126)
Anion gap: 6 (ref 5–15)
BUN: 22 mg/dL (ref 8–23)
CO2: 25 mmol/L (ref 22–32)
Calcium: 8.7 mg/dL — ABNORMAL LOW (ref 8.9–10.3)
Chloride: 101 mmol/L (ref 98–111)
Creatinine, Ser: 0.93 mg/dL (ref 0.61–1.24)
GFR, Estimated: >60 mL/min (ref >60)
Glucose, Bld: 216 mg/dL — ABNORMAL HIGH (ref 70–99)
Potassium: 4.2 mmol/L (ref 3.5–5.1)
Sodium: 132 mmol/L — ABNORMAL LOW (ref 135–145)
Total Bilirubin: 0.8 mg/dL (ref 0.3–1.2)
Total Protein: 6 g/dL — ABNORMAL LOW (ref 6.5–8.1)

## 2021-05-16 LAB — GLUCOSE, CAPILLARY
Glucose-Capillary: 192 mg/dL — ABNORMAL HIGH (ref 70–99)
Glucose-Capillary: 201 mg/dL — ABNORMAL HIGH (ref 70–99)

## 2021-05-16 LAB — PHOSPHORUS: Phosphorus: 3.4 mg/dL (ref 2.5–4.6)

## 2021-05-16 LAB — CBC
HCT: 38.6 % — ABNORMAL LOW (ref 39.0–52.0)
Hemoglobin: 12.4 g/dL — ABNORMAL LOW (ref 13.0–17.0)
MCH: 30.5 pg (ref 26.0–34.0)
MCHC: 32.1 g/dL (ref 30.0–36.0)
MCV: 94.8 fL (ref 80.0–100.0)
Platelets: 149 10*3/uL — ABNORMAL LOW (ref 150–400)
RBC: 4.07 MIL/uL — ABNORMAL LOW (ref 4.22–5.81)
RDW: 13 % (ref 11.5–15.5)
WBC: 7.7 10*3/uL (ref 4.0–10.5)
nRBC: 0 % (ref 0.0–0.2)

## 2021-05-16 LAB — D-DIMER, QUANTITATIVE: D-Dimer, Quant: 3.73 ug/mL-FEU — ABNORMAL HIGH (ref 0.00–0.50)

## 2021-05-16 LAB — C-REACTIVE PROTEIN: CRP: 1.7 mg/dL — ABNORMAL HIGH (ref <1.0)

## 2021-05-16 LAB — MAGNESIUM: Magnesium: 2.2 mg/dL (ref 1.7–2.4)

## 2021-05-16 LAB — FERRITIN: Ferritin: 61 ng/mL (ref 24–336)

## 2021-05-16 MED ORDER — LISINOPRIL 20 MG PO TABS
20.0000 mg | ORAL_TABLET | Freq: Every day | ORAL | Status: DC
  Administered 2021-05-16: 20 mg via ORAL
  Filled 2021-05-16: qty 1

## 2021-05-16 MED ORDER — AMOXICILLIN-POT CLAVULANATE 875-125 MG PO TABS: 1.0000 | ORAL_TABLET | Freq: Two times a day (BID) | ORAL | Status: DC

## 2021-05-16 MED ORDER — SACCHAROMYCES BOULARDII 250 MG PO CAPS: 250.0000 mg | ORAL_CAPSULE | Freq: Two times a day (BID) | ORAL | 0 refills | Status: AC

## 2021-05-16 MED ORDER — ISOSORBIDE MONONITRATE ER 60 MG PO TB24: 60.0000 mg | ORAL_TABLET | Freq: Every day | ORAL | 2 refills | Status: DC

## 2021-05-16 MED ORDER — AMOXICILLIN-POT CLAVULANATE 875-125 MG PO TABS: 1.0000 | ORAL_TABLET | Freq: Two times a day (BID) | ORAL | 0 refills | Status: AC

## 2021-05-16 MED ORDER — PREDNISONE 10 MG PO TABS: ORAL_TABLET | ORAL | 0 refills | Status: DC

## 2021-05-16 NOTE — Discharge Summary (Signed)
Physician Discharge Summary  Arthur Lutz TFT:[id number] DOB: 10-08-1940 DOA: 05/12/2021  PCP: Redmond School, MD  Admit date: 05/12/2021 Discharge date: 05/16/2021  Admitted From: Home Discharge disposition: Home   Code Status: Full Code   Discharge Diagnosis:   Principal Problem:   Chest pain Active Problems:   CAD S/P percutaneous coronary angioplasty: 08/07/11 cutting balloon PTCA to 95% ostial diag. and 90%  proximal LCX.  prior cutting balloon to RCA  & ostium of ostial diag., 2009    Essential hypertension   Diabetes mellitus type 2, controlled, with complications ([place])   Hyperlipidemia associated with type 2 diabetes mellitus ([place])   Venous stasis of both lower extremities   AKI (acute kidney injury) ([place])   COVID-19 virus infection   Elevated d-dimer   Acute metabolic encephalopathy     Chief Complaint  Patient presents with   Chest Pain   Brief narrative: Arthur Lutz is a 80 y.o. male with PMH significant for DM2, HTN, HLD, CAD s/p stents in 2013, venous stasis. Patient initially presented to the ED on 10/15 after he threw up all night long every 10 to 15 minutes.  For the last 2 to 3 weeks, patient was having intermittent vomiting, diarrhea as well.   In the ED, he was afebrile, blood pressure was significantly elevated to 210/84 Labs with creatinine elevated 1.71 COVID PCR positive D-dimer more than 20 Troponin was elevated to 40 Right upper quadrant ultrasound showing multiple cystic masses, knows suspicious solid mass, no gallstone Patient was admitted to hospital service for COVID-19.  He was started on steroids, he refused IV remdesivir.  On 10/17, patient to was noted to have high fever up to 104.7, increased work of breathing, hypoxia up to 6 L of oxygen requirement, encephalopathy.  Critical care was consulted and patient was transferred to ICU. With rapid clinical improvement, patient was transferred out to hospitalist service again on  10/18.  See below for details.  Subjective: Patient was seen and examined this morning.   Sitting up in chair.  Not in distress.  Not on supplemental oxygen.  Feels ready to go home.  Family at bedside.  Hospital course: Severe COVID infection Acute respiratory failure with hypoxia  -Presented with vomiting, diarrhea on admission, he was COVID-positive but did not have respiratory symptoms and chest x-ray did not show any evidence of pneumonia.  But after 24 hours, he had high fever, increased work of breathing requiring transfer to ICU.  CT chest again did not show evidence of pneumonia. -He improved rapidly and was transferred out of the ICU in 24 hours. -On admission he was started on IV steroids.  He did not want remdesivir. -He is currently on IV Solu-Medrol being tapered off. -He required supplemental oxygen at first, later weaned down.  Currently able to ambulate on room air.   -We will discharge on a tapering course of prednisone -WBC and inflammatory markers trend as below.  CRP peaked at 5.9 on 10/17, better today. Recent Labs  Lab 05/12/21 1305 05/12/21 1450 05/12/21 2102 05/13/21 0228 05/13/21 1442 05/13/21 1507 05/13/21 1702 05/14/21 0749 05/15/21 0603 05/16/21 0301  SARSCOV2NAA POSITIVE*  --   --   --   --   --   --   --   --   --   WBC  --   --   --  6.5 10.8*  --   --  12.7* 9.6 7.7  LATICACIDVEN  --   --   --   --  2.1*  --  1.1  --   --   --   PROCALCITON  --   --   --   --   --  <0.10  --  1.24 0.72  --   DDIMER  --  >20.00*  --  >20.00*  --   --   --  4.80* 2.61* 3.73*  FERRITIN  --   --  42 43  --   --   --  78 66 61  CRP  --   --  2.0* 2.2*  --   --   --  5.9* 3.5* 1.7*  ALT  --   --   --  15 15  --   --  14 15 28    Acute metabolic encephalopathy -Confusion, delirium due to high fever.  Resolved.  SIRS physiology Bacterial growth in blood culture -Fever, leukocytosis, confusion -all likely due to viral illness. -No evidence of bacterial infection at  this time.  Blood culture sent on 10/16 is growing staph aureus and MRSA, likely contaminant.  Currently improving on broad-spectrum antibiotics.  Discussed with family.  Family is more comfortable with 3 more days of oral antibiotics post discharge.  Oral Augmentin.  AKI -Creatinine was elevated to 1.71 at presentation, improved to normal. Recent Labs    08/21/20 0819 01/30/21 0819 04/04/21 0743 05/12/21 1041 05/13/21 0228 05/13/21 1442 05/14/21 0749 05/15/21 0603 05/16/21 0301  BUN 14 19 16 19 20  24* 25* 23 22  CREATININE 0.93 0.87 0.97 1.71* 1.63* 1.71* 1.28* 0.97 0.93   Chest pain History of CAD -Cardiology following.  Currently on Coreg, Imdur, statin and Zetia. -DVT scan was negative and hence heparin drip stopped today.  Chronic diastolic CHF Essential hypertension -Echo 10/17 with EF 55 to 08%, grade 1 diastolic dysfunction. -Home meds include low-dose Coreg, Imdur, lisinopril, Aldactone, Lasix as needed -Per recommendation, Coreg was stopped because of bradycardia.  We will discharge on Imdur 60 mg daily, lisinopril 20 mg daily, Aldactone 25 mg daily.  Lasix as needed to resume at home.  Type 2 diabetes mellitus Hyperglycemia due to steroids -A1c 6.9 on 10/16 -Blood sugar level is currently running elevated because of steroids. -Home meds include metformin 500 mg daily.  Resume the same at home.  Abnormal CT abdomen findings -10/7, CT abdomen pelvis showed multiple nonacute findings as below.  I do not think they need acute attention at this time but a repeat scan in 6 months would be helpful. -Findings were: Stable hepatic cysts, unchanged, 10 mm hypodense area in the tail of pancreas, right kidney cyst, sigmoid colon diverticulosis without diverticulitis, prominent deep of the appendix without surrounding inflammation.  No clinical evidence of appendicitis at this time. -I ordered for an ambulatory referral for general surgery appointment as an  outpatient.   Allergies as of 05/16/2021       Reactions   Aspirin Other (See Comments)   Was told to not take this due to having macular degeneration   Rosuvastatin Calcium Other (See Comments)   Aches and cramps   Other Rash, Other (See Comments)   Red meat = develops a rash on the back        Medication List     STOP taking these medications    carvedilol 3.125 MG tablet Commonly known as: COREG   indomethacin 50 MG capsule Commonly known as: INDOCIN   meloxicam 15 MG tablet Commonly known as: MOBIC       TAKE these medications  acetaminophen 650 MG CR tablet Commonly known as: TYLENOL Take 650 mg by mouth every 8 (eight) hours as needed for pain.   amoxicillin-clavulanate 875-125 MG tablet Commonly known as: AUGMENTIN Take 1 tablet by mouth every 12 (twelve) hours for 3 days.   azelastine 0.1 % nasal spray Commonly known as: ASTELIN USE 2 SPRAYS IN EACH NOSTRIL TWICE DAILY What changed:  how much to take how to take this when to take this reasons to take this   budesonide 0.5 MG/2ML nebulizer solution Commonly known as: PULMICORT FLUSH 2 ML MIXED WITH NASAL SALINE INTO THE NASAL PASSAGES DAILY AS DIRECTED.   cetirizine 10 MG tablet Commonly known as: ZyrTEC Allergy Take 1 tablet (10 mg total) by mouth daily. What changed:  when to take this reasons to take this   clobetasol 0.05 % external solution Commonly known as: TEMOVATE Apply 1 application topically daily as needed (irritation).   Co Q-10 100 MG Caps Take 300 capsules by mouth 3 (three) times daily. What changed:  how much to take when to take this   cyclobenzaprine 10 MG tablet Commonly known as: FLEXERIL Take 1 tablet (10 mg total) by mouth 2 (two) times daily as needed for muscle spasms.   EPINEPHrine 0.3 mg/0.3 mL Soaj injection Commonly known as: EPI-PEN Inject 0.3 mg into the muscle as needed for anaphylaxis.   ezetimibe 10 MG tablet Commonly known as: ZETIA Take 1  tablet (10 mg total) by mouth daily.   furosemide 20 MG tablet Commonly known as: LASIX Take 1 tablet (20 mg total) by mouth as needed. for worsening swelling -- (take instead of HCTZ). What changed: when to take this   isosorbide mononitrate 60 MG 24 hr tablet Commonly known as: IMDUR Take 1 tablet (60 mg total) by mouth daily. Start taking on: May 17, 2021 What changed:  medication strength See the new instructions.   lisinopril 40 MG tablet Commonly known as: ZESTRIL TAKE 1/2 TABLET BY MOUTH DAILY   loperamide 2 MG capsule Commonly known as: IMODIUM Take 1 capsule (2 mg total) by mouth 4 (four) times daily as needed for diarrhea or loose stools.   Lotemax 0.5 % Oint Generic drug: Loteprednol Etabonate Place 1 application into the left eye daily as needed (irritation, dry eyes).   magnesium gluconate 500 MG tablet Commonly known as: MAGONATE Take 500 mg by mouth daily.   metFORMIN 500 MG tablet Commonly known as: GLUCOPHAGE Take 500 mg by mouth daily with breakfast.   prednisoLONE acetate 1 % ophthalmic suspension Commonly known as: PRED FORTE Place 1 drop into both eyes 2 (two) times daily as needed (irritation).   predniSONE 10 MG tablet Commonly known as: DELTASONE Take 4 tablets daily X 2 days, then, Take 3 tablets daily X 2 days, then, Take 2 tablets daily X 2 days, then, Take 1 tablets daily X 1 day. What changed:  how much to take how to take this when to take this reasons to take this additional instructions   PRESERVISION AREDS PO Take 1 capsule by mouth 2 (two) times daily.   saccharomyces boulardii 250 MG capsule Commonly known as: FLORASTOR Take 1 capsule (250 mg total) by mouth 2 (two) times daily for 5 days.   simvastatin 20 MG tablet Commonly known as: ZOCOR Take 1 tablet (20 mg total) by mouth at bedtime.   spironolactone 25 MG tablet Commonly known as: ALDACTONE Take 1 tablet (25 mg total) by mouth daily.   tacrolimus 0.1 %  ointment Commonly known as: PROTOPIC Apply topically 2 (two) times daily. What changed:  how much to take when to take this reasons to take this   tobramycin 0.3 % ophthalmic solution Commonly known as: TOBREX Place 1 drop into both eyes daily as needed (before , during and after injection).   triazolam 0.125 MG tablet Commonly known as: HALCION Take 1.5 tablets (0.1875 mg total) by mouth at bedtime. Pt takes 1.5 tabs at bedtime What changed:  how much to take how to take this when to take this additional instructions   Turmeric 500 MG Caps Take 1,000 mg by mouth daily.   vitamin C 1000 MG tablet Take 1,000 mg by mouth 2 (two) times daily.   Vitamin D 125 MCG (5000 UT) Caps Take 5,000 Units by mouth daily.   Xhance 93 MCG/ACT Exhu Generic drug: Fluticasone Propionate Place 2 application into the nose 2 (two) times daily. What changed:  when to take this reasons to take this        Discharge Instructions:  Diet Recommendation:  Discharge Diet Orders (From admission, onward)     Start     Ordered   05/16/21 0000  Diet - low sodium heart healthy        05/16/21 1021   05/16/21 0000  Diet Carb Modified        05/16/21 1021              @BRDDSCINSTRUCTIONS @  Follow ups:    Follow-up Information     Redmond School, MD Follow up.   Specialty: Internal Medicine Contact information: [address] [place] [address] [phone]         Leonie Man, MD .   Specialty: Cardiology Contact information: [address] [place] [address] [phone]                 Wound care:     Discharge Exam:   Vitals:   05/15/21 2032 05/16/21 0035 05/16/21 0301 05/16/21 0746  BP: (!) 164/88 (!) 143/69 (!) 167/72 (!) 159/87  Pulse: (!) 47 67 (!) 48 (!) 57  Resp: 20 18 16 18   Temp: 98.7 F (37.1 C) 98 F (36.7 C) 98.7 F (37.1 C) 98.6 F (37 C)  TempSrc: Oral Oral  Oral  SpO2: 90% 93% 91%   Weight:       Height:        Body mass index is 34.74 kg/m.  General exam: Pleasant, not in distress Skin: No rashes, lesions or ulcers. HEENT: Atraumatic, normocephalic, no obvious bleeding Lungs: Clear to auscultation bilaterally CVS: Regular rate and rhythm, no murmur GI/Abd soft, nontender, nondistended, bowel sound present CNS: Alert, awake, oriented times Psychiatry: Mood appropriate Extremities: No pedal edema, no calf tenderness  Time coordinating discharge: 35 minutes   The results of significant diagnostics from this hospitalization (including imaging, microbiology, ancillary and laboratory) are listed below for reference.    Procedures and Diagnostic Studies:   DG Chest 2 View  Result Date: 05/12/2021 CLINICAL DATA:  Chest pain EXAM: CHEST - 2 VIEW COMPARISON:  Chest radiograph 11/27/2020 FINDINGS: The left chest wall port is in stable position. The cardiomediastinal silhouette is stable. There is unchanged asymmetric elevation of the right hemidiaphragm. Linear opacities projecting over the right base likely reflect atelectasis and/or scar. There is no focal consolidation. There is no pulmonary edema. There is no pleural effusion or pneumothorax. There is no acute osseous abnormality. IMPRESSION: Right basilar atelectasis and/or scar. Otherwise,  no radiographic evidence of acute cardiopulmonary process. Electronically Signed   By: Valetta Mole M.D.   On: 05/12/2021 11:22   NM Pulmonary Perfusion  Result Date: 05/13/2021 CLINICAL DATA:  PE suspected.  Positive D-dimer. EXAM: NUCLEAR MEDICINE PERFUSION LUNG SCAN TECHNIQUE: Perfusion images were obtained in multiple projections after intravenous injection of radiopharmaceutical. Ventilation scans intentionally deferred if perfusion scan and chest x-ray adequate for interpretation during COVID 19 epidemic. RADIOPHARMACEUTICALS:  4.4 millicuries mCi WN-02V MAA IV COMPARISON:  None. FINDINGS: Appropriate radiotracer uptake throughout both  lungs. No perfusion defect seen to suggest pulmonary embolism. IMPRESSION: No evidence of pulmonary embolism. Electronically Signed   By: Franki Cabot M.D.   On: 05/13/2021 09:46   DG CHEST PORT 1 VIEW  Result Date: 05/13/2021 CLINICAL DATA:  COVID positive, hypoxia EXAM: PORTABLE CHEST 1 VIEW COMPARISON:  05/12/2021 FINDINGS: Left Port-A-Cath remains in place, unchanged. Cardiomegaly, aortic atherosclerosis. Right base atelectasis or scarring, stable. Left lung clear. No effusions or acute bony abnormality. IMPRESSION: Right base atelectasis or scarring, stable. No active disease. Electronically Signed   By: Rolm Baptise M.D.   On: 05/13/2021 17:13   US Abdomen Limited RUQ (LIVER/GB)  Result Date: 05/12/2021 CLINICAL DATA:  Right upper quadrant pain EXAM: ULTRASOUND ABDOMEN LIMITED RIGHT UPPER QUADRANT COMPARISON:  None. FINDINGS: Gallbladder: No gallstones or wall thickening visualized. No sonographic Murphy sign noted by sonographer. Common bile duct: Diameter: 0.5 cm, within normal limits Liver: Multiple cystic masses noted in the left hepatic lobe, as seen on prior CT. The largest measures 3.8 x 2.9 x 4.8 cm. No suspicious solid mass. Within normal limits in parenchymal echogenicity. Portal vein is patent on color Doppler imaging with normal direction of blood flow towards the liver. Other: None. IMPRESSION: No sonographic finding to explain the patient's acute right upper quadrant pain. Electronically Signed   By: Audie Pinto M.D.   On: 05/12/2021 16:40     Labs:   Basic Metabolic Panel: Recent Labs  Lab 05/13/21 0228 05/13/21 1442 05/13/21 1751 05/14/21 0749 05/15/21 0603 05/16/21 0301  NA 140 139 140 136 137 132*  K 3.9 4.3 3.5 4.0 4.5 4.2  CL 104 103  --  104 104 101  CO2 27 24  --  26 26 25   GLUCOSE 176* 160*  --  215* 190* 216*  BUN 20 24*  --  25* 23 22  CREATININE 1.63* 1.71*  --  1.28* 0.97 0.93  CALCIUM 8.9 9.1  --  8.7* 8.7* 8.7*  MG 2.1  --   --  2.3 2.4 2.2   PHOS 3.4  --   --  4.2 3.8 3.4   GFR Estimated Creatinine Clearance: 87.5 mL/min (by C-G formula based on SCr of 0.93 mg/dL). Liver Function Tests: Recent Labs  Lab 05/13/21 0228 05/13/21 1442 05/14/21 0749 05/15/21 0603 05/16/21 0301  AST 13* 18 13* 18 23  ALT 15 15 14 15 28   ALKPHOS 57 63 56 58 60  BILITOT 1.2 1.2 0.7 0.8 0.8  PROT 5.8* 6.4* 5.8* 5.8* 6.0*  ALBUMIN 3.5 3.7 3.2* 3.0* 3.3*   Recent Labs  Lab 05/12/21 1041  LIPASE 26   No results for input(s): AMMONIA in the last 168 hours. Coagulation profile Recent Labs  Lab 05/13/21 1442  INR 1.1    CBC: Recent Labs  Lab 05/12/21 1041 05/13/21 0228 05/13/21 1442 05/13/21 1751 05/14/21 0749 05/15/21 0603 05/16/21 0301  WBC 6.8 6.5 10.8*  --  12.7* 9.6 7.7  NEUTROABS 4.8  --  9.7*  --   --   --   --   HGB 13.4 12.5* 13.3 12.6* 12.5* 12.2* 12.4*  HCT 41.9 37.9* 40.8 37.0* 38.2* 36.1* 38.6*  MCV 96.3 95.0 93.6  --  95.0 93.5 94.8  PLT 135* 124* 144*  --  135* 123* 149*   Cardiac Enzymes: Recent Labs  Lab 05/13/21 1702  CKTOTAL 35*   BNP: Invalid input(s): POCBNP CBG: Recent Labs  Lab 05/15/21 0752 05/15/21 1125 05/15/21 1559 05/15/21 2030 05/16/21 0746  GLUCAP 174* 172* 108* 163* 192*   D-Dimer Recent Labs    05/15/21 0603 05/16/21 0301  DDIMER 2.61* 3.73*   Hgb A1c Recent Labs    05/13/21 1530  HGBA1C 6.9*   Lipid Profile No results for input(s): CHOL, HDL, LDLCALC, TRIG, CHOLHDL, LDLDIRECT in the last 72 hours. Thyroid function studies No results for input(s): TSH, T4TOTAL, T3FREE, THYROIDAB in the last 72 hours.  Invalid input(s): FREET3 Anemia work up Recent Labs    05/15/21 0603 05/16/21 0301  FERRITIN 66 36   Microbiology Recent Results (from the past 240 hour(s))  Resp Panel by RT-PCR (Flu A&B, Covid) Nasopharyngeal Swab     Status: Abnormal   Collection Time: 05/12/21  1:05 PM   Specimen: Nasopharyngeal Swab; Nasopharyngeal(NP) swabs in vial transport medium   Result Value Ref Range Status   SARS Coronavirus 2 by RT PCR POSITIVE (A) NEGATIVE Final    Comment: RESULT CALLED TO, READ BACK BY AND VERIFIED WITH:  ZOE CAGLE 05/12/21 1628 ADL  (NOTE) SARS-CoV-2 target nucleic acids are DETECTED.  The SARS-CoV-2 RNA is generally detectable in upper respiratory specimens during the acute phase of infection. Positive results are indicative of the presence of the identified virus, but do not rule out bacterial infection or co-infection with other pathogens not detected by the test. Clinical correlation with patient history and other diagnostic information is necessary to determine patient infection status. The expected result is Negative.  Fact Sheet for Patients: EntrepreneurPulse.com.au  Fact Sheet for Healthcare Providers: IncredibleEmployment.be  This test is not yet approved or cleared by the [place] FDA and  has been authorized for detection and/or diagnosis of SARS-CoV-2 by FDA under an Emergency Use Authorization (EUA).  This EUA will remain in effect (meaning this test can be use d) for the duration of  the COVID-19 declaration under Section 564(b)(1) of the Act, 21 U.S.C. section 360bbb-3(b)(1), unless the authorization is terminated or revoked sooner.     Influenza A by PCR NEGATIVE NEGATIVE Final   Influenza B by PCR NEGATIVE NEGATIVE Final    Comment: (NOTE) The Xpert Xpress SARS-CoV-2/FLU/RSV plus assay is intended as an aid in the diagnosis of influenza from Nasopharyngeal swab specimens and should not be used as a sole basis for treatment. Nasal washings and aspirates are unacceptable for Xpert Xpress SARS-CoV-2/FLU/RSV testing.  Fact Sheet for Patients: EntrepreneurPulse.com.au  Fact Sheet for Healthcare Providers: IncredibleEmployment.be  This test is not yet approved or cleared by the [place] FDA and has been authorized for detection  and/or diagnosis of SARS-CoV-2 by FDA under an Emergency Use Authorization (EUA). This EUA will remain in effect (meaning this test can be used) for the duration of the COVID-19 declaration under Section 564(b)(1) of the Act, 21 U.S.C. section 360bbb-3(b)(1), unless the authorization is terminated or revoked.  Performed at [facility], [place] [address], [place], [place] [address]   MRSA Next Gen by PCR, Nasal     Status: None  Collection Time: 05/13/21  2:12 PM   Specimen: Nasal Mucosa; Nasal Swab  Result Value Ref Range Status   MRSA by PCR Next Gen NOT DETECTED NOT DETECTED Final    Comment: (NOTE) The GeneXpert MRSA Assay (FDA approved for NASAL specimens only), is one component of a comprehensive MRSA colonization surveillance program. It is not intended to diagnose MRSA infection nor to guide or monitor treatment for MRSA infections. Test performance is not FDA approved in patients less than 21 years old. Performed at [facility], [place] [address], [place], [place] [address]   Culture, blood (routine x 2)     Status: Abnormal   Collection Time: 05/13/21  2:42 PM   Specimen: BLOOD LEFT ARM  Result Value Ref Range Status   Specimen Description BLOOD LEFT ARM  Final   Special Requests   Final    BOTTLES DRAWN AEROBIC ONLY Blood Culture results may not be optimal due to an inadequate volume of blood received in culture bottles   Culture  Setup Time   Final    GRAM POSITIVE COCCI IN CLUSTERS AEROBIC BOTTLE ONLY CRITICAL VALUE NOTED.  VALUE IS CONSISTENT WITH PREVIOUSLY REPORTED AND CALLED VALUE.    Culture (A)  Final    STAPHYLOCOCCUS EPIDERMIDIS SUSCEPTIBILITIES PERFORMED ON PREVIOUS CULTURE WITHIN THE LAST 5 DAYS. Performed at [facility], [place] [address], [place],  [address]    Report Status 05/16/2021 FINAL  Final  Culture, blood (routine x 2)     Status: Abnormal   Collection Time: 05/13/21  2:42 PM   Specimen: BLOOD RIGHT ARM  Result Value  Ref Range Status   Specimen Description BLOOD RIGHT ARM  Final   Special Requests   Final    BOTTLES DRAWN AEROBIC ONLY Blood Culture adequate volume   Culture  Setup Time   Final    GRAM POSITIVE COCCI IN CLUSTERS AEROBIC BOTTLE ONLY CRITICAL RESULT CALLED TO, READ BACK BY AND VERIFIED WITH: PHARMD HAILEY VEON Luke 05/14/2021 @1908  BY JW Performed at [facility], [place] [address] [place], [place] [address]    Culture STAPHYLOCOCCUS EPIDERMIDIS (A)  Final   Report Status 05/16/2021 FINAL  Final   Organism ID, Bacteria STAPHYLOCOCCUS EPIDERMIDIS  Final      Susceptibility   Staphylococcus epidermidis - MIC*    CIPROFLOXACIN >=8 RESISTANT Resistant     ERYTHROMYCIN <=0.25 SENSITIVE Sensitive     GENTAMICIN <=0.5 SENSITIVE Sensitive     OXACILLIN >=4 RESISTANT Resistant     TETRACYCLINE 2 SENSITIVE Sensitive     VANCOMYCIN 1 SENSITIVE Sensitive     TRIMETH/SULFA <=10 SENSITIVE Sensitive     CLINDAMYCIN <=0.25 SENSITIVE Sensitive     RIFAMPIN <=0.5 SENSITIVE Sensitive     Inducible Clindamycin NEGATIVE Sensitive     * STAPHYLOCOCCUS EPIDERMIDIS  Blood Culture ID Panel (Reflexed)     Status: Abnormal   Collection Time: 05/13/21  2:42 PM  Result Value Ref Range Status   Enterococcus faecalis NOT DETECTED NOT DETECTED Final   Enterococcus Faecium NOT DETECTED NOT DETECTED Final   Listeria monocytogenes NOT DETECTED NOT DETECTED Final   Staphylococcus species DETECTED (A) NOT DETECTED Final    Comment: CRITICAL RESULT CALLED TO, READ BACK BY AND VERIFIED WITH: PHARMD HAILEY VEON DOHOEN 05/14/2021 @1908  BY JW    Staphylococcus aureus (BCID) NOT DETECTED NOT DETECTED Final   Staphylococcus epidermidis DETECTED (A) NOT DETECTED Final    Comment: Methicillin (oxacillin) resistant coagulase negative staphylococcus. Possible  blood culture contaminant (unless isolated from more than one blood culture draw or clinical case suggests pathogenicity). No antibiotic treatment is indicated  for blood  culture contaminants. CRITICAL RESULT CALLED TO, READ BACK BY AND VERIFIED WITH: PHARMD HAILEY VEON DOHOEN 05/14/2021 @1908  BY JW    Staphylococcus lugdunensis NOT DETECTED NOT DETECTED Final   Streptococcus species NOT DETECTED NOT DETECTED Final   Streptococcus agalactiae NOT DETECTED NOT DETECTED Final   Streptococcus pneumoniae NOT DETECTED NOT DETECTED Final   Streptococcus pyogenes NOT DETECTED NOT DETECTED Final   A.calcoaceticus-baumannii NOT DETECTED NOT DETECTED Final   Bacteroides fragilis NOT DETECTED NOT DETECTED Final   Enterobacterales NOT DETECTED NOT DETECTED Final   Enterobacter cloacae complex NOT DETECTED NOT DETECTED Final   Escherichia coli NOT DETECTED NOT DETECTED Final   Klebsiella aerogenes NOT DETECTED NOT DETECTED Final   Klebsiella oxytoca NOT DETECTED NOT DETECTED Final   Klebsiella pneumoniae NOT DETECTED NOT DETECTED Final   Proteus species NOT DETECTED NOT DETECTED Final   Salmonella species NOT DETECTED NOT DETECTED Final   Serratia marcescens NOT DETECTED NOT DETECTED Final   Haemophilus influenzae NOT DETECTED NOT DETECTED Final   Neisseria meningitidis NOT DETECTED NOT DETECTED Final   Pseudomonas aeruginosa NOT DETECTED NOT DETECTED Final   Stenotrophomonas maltophilia NOT DETECTED NOT DETECTED Final   Candida albicans NOT DETECTED NOT DETECTED Final   Candida auris NOT DETECTED NOT DETECTED Final   Candida glabrata NOT DETECTED NOT DETECTED Final   Candida krusei NOT DETECTED NOT DETECTED Final   Candida parapsilosis NOT DETECTED NOT DETECTED Final   Candida tropicalis NOT DETECTED NOT DETECTED Final   Cryptococcus neoformans/gattii NOT DETECTED NOT DETECTED Final   Methicillin resistance mecA/C DETECTED (A) NOT DETECTED Final    Comment: CRITICAL RESULT CALLED TO, READ BACK BY AND VERIFIED WITH: PHARMD HAILEY VEON DOHOEN 05/14/2021 @1908  BY JW Performed at [facility], [address] [place], [place] [address]       Signed: Terrilee Croak  [facility] 05/16/2021, 10:21 AM

## 2021-05-21 ENCOUNTER — Encounter: Payer: Self-pay | Admitting: Cardiology

## 2021-05-21 ENCOUNTER — Other Ambulatory Visit: Payer: Self-pay

## 2021-05-21 ENCOUNTER — Ambulatory Visit (INDEPENDENT_AMBULATORY_CARE_PROVIDER_SITE_OTHER): Payer: Medicare Other

## 2021-05-21 ENCOUNTER — Ambulatory Visit: Payer: Medicare Other | Admitting: Cardiology

## 2021-05-21 VITALS — BP 136/78 | HR 51 | Ht 72.0 in | Wt 249.8 lb

## 2021-05-21 DIAGNOSIS — E1169 Type 2 diabetes mellitus with other specified complication: Secondary | ICD-10-CM

## 2021-05-21 DIAGNOSIS — R001 Bradycardia, unspecified: Secondary | ICD-10-CM

## 2021-05-21 DIAGNOSIS — Z9861 Coronary angioplasty status: Secondary | ICD-10-CM

## 2021-05-21 DIAGNOSIS — T466X5A Adverse effect of antihyperlipidemic and antiarteriosclerotic drugs, initial encounter: Secondary | ICD-10-CM

## 2021-05-21 DIAGNOSIS — Z6833 Body mass index (BMI) 33.0-33.9, adult: Secondary | ICD-10-CM

## 2021-05-21 DIAGNOSIS — I208 Other forms of angina pectoris: Secondary | ICD-10-CM

## 2021-05-21 DIAGNOSIS — I878 Other specified disorders of veins: Secondary | ICD-10-CM

## 2021-05-21 DIAGNOSIS — I2510 Atherosclerotic heart disease of native coronary artery without angina pectoris: Secondary | ICD-10-CM

## 2021-05-21 DIAGNOSIS — I872 Venous insufficiency (chronic) (peripheral): Secondary | ICD-10-CM

## 2021-05-21 DIAGNOSIS — G720 Drug-induced myopathy: Secondary | ICD-10-CM

## 2021-05-21 DIAGNOSIS — E669 Obesity, unspecified: Secondary | ICD-10-CM

## 2021-05-21 DIAGNOSIS — I25118 Atherosclerotic heart disease of native coronary artery with other forms of angina pectoris: Secondary | ICD-10-CM | POA: Diagnosis not present

## 2021-05-21 DIAGNOSIS — I10 Essential (primary) hypertension: Secondary | ICD-10-CM | POA: Diagnosis not present

## 2021-05-21 DIAGNOSIS — E785 Hyperlipidemia, unspecified: Secondary | ICD-10-CM

## 2021-05-21 DIAGNOSIS — R0609 Other forms of dyspnea: Secondary | ICD-10-CM

## 2021-05-21 NOTE — Assessment & Plan Note (Signed)
He clearly did not tolerate beta-blocker.  However he still having bradycardia episodes despite not being on beta-blocker.  At this point I am a little bit concerned about chronotropic competence since states he is having some exertional dyspnea and chest pain.  He also tells me his heart rate goes down to the low 30s.  Need to determine if he does have appropriate response to exercise.  If not, the presence of CAD and desire to use beta-blocker plus some chronotropic competence may be an indication for referral to PPM placement  Plan: 7-day Zio patch monitor.  I have asked them to keep track of his activity level while wearing the monitor.

## 2021-05-21 NOTE — Assessment & Plan Note (Signed)
Actually Is Zoloft better.  He is no longer on amlodipine and his pressures are looking okay on simply lisinopril and Imdur.  We will simply hold off amlodipine for now and continue with standing dose Lasix.  No longer on HCTZ.  Also stressed importance of foot elevation and consider support stockings if seated for long period time.

## 2021-05-21 NOTE — Assessment & Plan Note (Signed)
Has not tolerated atorvastatin, rosuvastatin and high doses of simvastatin.  Somewhat concerning.  He is now on Zetia alone.  Were going to allow for statin holiday and reassess lipids.  If there is no real change having been on or off statin, then I think that we may need to consider referral for PCSK9 inhibitor or possible inclisiran to avoid further arthralgias and myalgias.

## 2021-05-21 NOTE — Assessment & Plan Note (Signed)
Probably still related to recent infection.  He had an echo done in the hospital that looked pretty good.  Consider the possibility of chronotropic incompetence-7-day Zio patch monitor to monitor heart rate responsiveness to exercise.

## 2021-05-21 NOTE — Assessment & Plan Note (Addendum)
He definitely lost weight with his COVID-19 infection.  Stabilizing now.  Needs to continue to stay active and exercise.   As his exercise level improves with his energy level post COVID, need to reassess chronotropic competence.

## 2021-05-21 NOTE — Patient Instructions (Addendum)
Medication Instructions:   Do not take your statin medication until  Dec 25 , 2022- STATIN HOLIDAY _ SIMVASTATIN  *If you need a refill on your cardiac medications before your next appointment, please call your pharmacy*   Lab Work:  Not needed   Testing/Procedures: Will be mailed to your home   the first week of Dec 2022 Your physician has recommended that you wear a holter monitor 7 days ZIO . Holter monitors are medical devices that record the heart's electrical activity. Doctors most often use these monitors to diagnose arrhythmias. Arrhythmias are problems with the speed or rhythm of the heartbeat. The monitor is a small, portable device. You can wear one while you do your normal daily activities. This is usually used to diagnose what is causing palpitations/syncope (passing out).    Follow-Up: At [facility], you and your health needs are our priority.  As part of our continuing mission to provide you with exceptional heart care, we have created designated Provider Care Teams.  These Care Teams include your primary Cardiologist (physician) and Advanced Practice Providers (APPs -  Physician Assistants and Nurse Practitioners) who all work together to provide you with the care you need, when you need it.     Your next appointment:   3 month(s)  The format for your next appointment:   In Person  Provider:   Glenetta Hew, MD   Other Instructions  ZIO XT- Long Term Monitor Instructions  Your physician has requested you wear a ZIO patch monitor for 7 days.  This is a single patch monitor. [employer] supplies one patch monitor per enrollment. Additional stickers are not available. Please do not apply patch if you will be having a Nuclear Stress Test,  Echocardiogram, Cardiac CT, MRI, or Chest Xray during the period you would be wearing the  monitor. The patch cannot be worn during these tests. You cannot remove and re-apply the  ZIO XT patch monitor.  Your ZIO patch monitor  will be mailed 3 day USPS to your address on file. It may take 3-5 days  to receive your monitor after you have been enrolled.  Once you have received your monitor, please review the enclosed instructions. Your monitor  has already been registered assigning a specific monitor serial # to you.  Billing and Patient Assistance Program Information  We have supplied [employer] with any of your insurance information on file for billing purposes. [employer] offers a sliding scale Patient Assistance Program for patients that do not have  insurance, or whose insurance does not completely cover the cost of the ZIO monitor.  You must apply for the Patient Assistance Program to qualify for this discounted rate.  To apply, please call [employer] at [phone], select option 4, select option 2, ask to apply for  Patient Assistance Program. Theodore Demark will ask your household income, and how many people  are in your household. They will quote your out-of-pocket cost based on that information.  [employer] will also be able to set up a 65-month, interest-free payment plan if needed.  Applying the monitor   Shave hair from upper left chest.  Hold abrader disc by orange tab. Rub abrader in 40 strokes over the upper left chest as  indicated in your monitor instructions.  Clean area with 4 enclosed alcohol pads. Let dry.  Apply patch as indicated in monitor instructions. Patch will be placed under collarbone on left  side of chest with arrow pointing upward.  Rub patch adhesive wings for 2  minutes. Remove white label marked "1". Remove the white  label marked "2". Rub patch adhesive wings for 2 additional minutes.  While looking in a mirror, press and release button in center of patch. A small green light will  flash 3-4 times. This will be your only indicator that the monitor has been turned on.  Do not shower for the first 24 hours. You may shower after the first 24 hours.  Press the button if you feel a symptom. You  will hear a small click. Record Date, Time and  Symptom in the Patient Logbook.  When you are ready to remove the patch, follow instructions on the last 2 pages of Patient  Logbook. Stick patch monitor onto the last page of Patient Logbook.  Place Patient Logbook in the blue and white box. Use locking tab on box and tape box closed  securely. The blue and white box has prepaid postage on it. Please place it in the mailbox as  soon as possible. Your physician should have your test results approximately 7 days after the  monitor has been mailed back to [facility].  Call [facility] at [phone] if you have questions regarding  your ZIO XT patch monitor. Call them immediately if you see an orange light blinking on your  monitor.  If your monitor falls off in less than 4 days, contact our Monitor department at [phone].  If your monitor becomes loose or falls off after 4 days call [employer] at [phone] for  suggestions on securing your monitor

## 2021-05-21 NOTE — Assessment & Plan Note (Addendum)
He is currently taking 20 mg simvastatin, and seems to be doing okay, but with him having so much fatigue and muscle aches etc. from his recent [place] hospitalization I would like to give him at least a 6-week statin holiday.  He will stay on ezetimibe only  Plan will be to recheck lipids in January timeframe.  We can then see how well he is doing with or without simvastatin.  Honestly, there is not really much difference, may want to consider the possibility of PCSK9 inhibitor versus inclisiran. . .  Last A1c was 6.9.  Pretty well controlled.  Is on metformin pretty much monotherapy.  Would consider SGLT2 inhibitor or potentially even GLP-1 agonist to assist with weight loss.  Both have cardiovascular benefit

## 2021-05-21 NOTE — Progress Notes (Signed)
Primary Care Provider: Redmond School, MD Cardiologist: Glenetta Hew, MD Electrophysiologist: None  Clinic Note: Chief Complaint  Patient presents with   Hospitalization Follow-up    Recent COVID infection    Coronary Artery Disease    Has occasional burning aching sensation in his chest with increased exertion, increased heart rate.   Bradycardia    Was noted to be bradycardic in the hospital, beta-blocker stopped. ->  Notes heart rates down into the 30s at night.   ===================================  ASSESSMENT/PLAN   Problem List Items Addressed This Visit       Cardiology Problems   CAD S/P percutaneous coronary angioplasty: 08/07/11 cutting balloon PTCA to 95% ostial diag. and 90%  proximal LCX.  prior cutting balloon to RCA  & ostium of ostial diag., 2009  (Chronic)    He had been doing pretty well with no recurrent anginal symptoms at the time of his last visit with me.  Now that he is post COVID infection, he has had some exertional chest tightness and pressure as well as dyspnea.  Concerning for possible recurrent classic stable angina.  Continue Imdur.  Unfortunately, we have discontinued both beta-blocker and calcium channel blocker-calcium channel blocker because of edema and beta-blocker because of bradycardia.  Plan:  Does not seem to be back on aspirin since his hospitalization, we will need to readdress this in follow-up and make sure that he is back on it.  No longer on Plavix. Not able to tolerate beta-blocker-bradycardia, amlodipine-edema On Imdur and ACE inhibitor Borderline statin tolerance.  Did not allow for a statin holiday continue with Zetia alone.  (Has been intolerant of both rosuvastatin and atorvastatin). ->  Pending CVRR reassessment after follow-up labs in January.        Relevant Orders   EKG 12-Lead   LONG TERM MONITOR (3-14 DAYS)   Essential hypertension (Chronic)    Has been borderline blood pressure today.  With him having  significant fatigue issues I am not to push too hard.  He tells me they are better than this at home.  He is only on lisinopril and Imdur along with spironolactone and Lasix. We do have room to gradually increase the spironolactone if necessary, but may need to consider hydralazine. Wanting to avoid amlodipine since his edema is so well controlled off of it altogether.      Hyperlipidemia associated with type 2 diabetes mellitus ([place]) (Chronic)    He is currently taking 20 mg simvastatin, and seems to be doing okay, but with him having so much fatigue and muscle aches etc. from his recent [place] hospitalization I would like to give him at least a 6-week statin holiday.  He will stay on ezetimibe only  Plan will be to recheck lipids in January timeframe.  We can then see how well he is doing with or without simvastatin.  Honestly, there is not really much difference, may want to consider the possibility of PCSK9 inhibitor versus inclisiran. . .  Last A1c was 6.9.  Pretty well controlled.  Is on metformin pretty much monotherapy.  Would consider SGLT2 inhibitor or potentially even GLP-1 agonist to assist with weight loss.  Both have cardiovascular benefit      Chronic stable angina (HCC) - negative Myoview & no new Dz on Cath (Chronic)    Stress test hopefully work for him.  He was doing pretty well until his COVID but now he is noticing this exertional chest burning sensation.  My plan for now will  be to allow him to recover from his COVID infection just to make sure that this is nothing related to that.  He still has Imdur which we have increased to 60 mg.  If he has bad spells he is not taking extra dose.  We will reassess in January timeframe to determine if he potentially would require evaluation at that time.  Not able to tolerate beta-blocker, however if were not able to control his angina without it, may need to consider pacemaker placement to allow [place] to use beta-blocker.      Venous  stasis of both lower extremities (Chronic)    Actually Is Zoloft better.  He is no longer on amlodipine and his pressures are looking okay on simply lisinopril and Imdur.  We will simply hold off amlodipine for now and continue with standing dose Lasix.  No longer on HCTZ.  Also stressed importance of foot elevation and consider support stockings if seated for long period time.      Sinus bradycardia - Primary (Chronic)    He clearly did not tolerate beta-blocker.  However he still having bradycardia episodes despite not being on beta-blocker.  At this point I am a little bit concerned about chronotropic competence since states he is having some exertional dyspnea and chest pain.  He also tells me his heart rate goes down to the low 30s.  Need to determine if he does have appropriate response to exercise.  If not, the presence of CAD and desire to use beta-blocker plus some chronotropic competence may be an indication for referral to PPM placement  Plan: 7-day Zio patch monitor.  I have asked them to keep track of his activity level while wearing the monitor.      Relevant Orders   EKG 12-Lead   LONG TERM MONITOR (3-14 DAYS)     Other   Statin myopathy (Chronic)    Has not tolerated atorvastatin, rosuvastatin and high doses of simvastatin.  Somewhat concerning.  He is now on Zetia alone.  Were going to allow for statin holiday and reassess lipids.  If there is no real change having been on or off statin, then I think that we may need to consider referral for PCSK9 inhibitor or possible inclisiran to avoid further arthralgias and myalgias.      Obesity (BMI 30.0-34.9) (Chronic)    He definitely lost weight with his COVID-19 infection.  Stabilizing now.  Needs to continue to stay active and exercise.   As his exercise level improves with his energy level post COVID, need to reassess chronotropic competence.      DOE (dyspnea on exertion) (Chronic)    Probably still related to recent  infection.  He had an echo done in the hospital that looked pretty good.  Consider the possibility of chronotropic incompetence-7-day Zio patch monitor to monitor heart rate responsiveness to exercise.       ===================================  HPI:    Arthur Lutz is a 80 y.o. male with a PMH notable for CAD (PTCA/POBA Colon [place] D2 2009 &2013; BMS PCI of RCA 2009, LCx 2013)), HTN, HLD, DM TYPE II & CHRONIC VENOUS STASIS below who presents today for 49-month/hospital follow-up.  May 2009 (Progressive Angina)-> Cardiac Cath/PCI: 80% concentric mid RCA.  Subtotal 95% ostial D1. => Mid RCA BMS PCI: Driver BMS 3.5 mm x 15 mm -> 4.0 mm; Scoring Balloon Angioplasty ostial D1) August 07, 2011 (Unstable Angina) -> Cardiac Cath/PCI: 90% proximal LCx (BMS PCI vision 3.5 mm x 12  mm -> 4.2 mm); 95% ostial, 60% proximal D1 (anatomic D1 is actually very small, for sake of continued consistency will call D1)--> FlexTome-Scoring Balloon Angioplasty 2.25 mm Balloon (stepwise increase from 1.5 mm standard balloon, 2.0 mm then 2.5 mm scoring balloon) March 06, 2012 (Atypical Angina)--> Cardiac Cath: Ostial diagonal 40% at PTCA site.  Widely patent proximal LCx stent with small jailed OM1 free of disease.  Widely patent mid RCA stent.  40% PDA  Arthur Lutz was last seen by me on 02/02/2021 via telemedicine.  Apparently was having some intolerance to Nexletol (?? Arthralgias/ deconditioning).  Was Hypertensive -->  Carvedilol 3.125 mg twice daily added and continued lisinopril 40 mg, amlodipine 10 mg and HCTZ 25 mg along with PRN  Lasix 20 mg (which he would use in lieu of HCTZ for edema)..  No longer on Plavix because of macular bleeding. => Plan was to follow-up with the CVRR pharmacist to discuss lipid management (unable to tolerate higher dose of simvastatin, rosuvastatin or atorvastatin, Zetia continued) and hypertension -> He was due for labs with PCP.  CV[facility] pharmacist visit 03/12/2021 Erasmo Downer Sawyer, [place]):  Edema was improved with using Lasix.  He was then doing it daily.  It was noted he was taking indomethacin as well as meloxicam,  and prednisone for arthritis pain.  Indomethacin was sent was twice daily started just after the hypertension visit.  Preferred avoiding frequent NSAIDs. Still noted stable heart rates, but monitoring for rates less than 50 on carvedilol; avoid NSAIDs Targeting LDL less than 55-he tried to restart Nexletol along with Zetia (written as Nexlizet).  With concern if he was still tolerating simvastatin given significant symptoms on rosuvastatin and atorvastatin.  Plan was to consider statin holiday.  -> He took Nexlizet for 4 weeks with no real change in LDL.  He would not agree to take Nexlizet and simvastatin 10 mg.  Seem to be tolerating low-dose 10 mg simvastatin therefore increase to 20 mg and resume Zetia.  Because of edema, amlodipine reduced from 10 to 5 mg.  --> Called in stating that he was taking carvedilol once daily.  My recommendation was to wean off carvedilol by taking 1/2 tablet twice daily for a week and then stop.  Added spironolactone 25 mg daily.  Recent Hospitalizations:  10/15-19/2021: Admitted with nausea vomiting and AKI creatinine at 1.7.  COVID positive with elevated D-dimer of 20.  Mid to the hospital service and treated with steroids (refused remdesivir).  He was febrile to 104.7 with AMS and hypoxia - requiring 6 L of oxygen and transferred to the ICU on 10/17.  Started on vancomycin, cefepime and Flagyl for fever. Following this he had rapid clinical improvement and transferred back to the floor on 10/18.  The following day he was weaned down to room air.  Was discharged on prednisone taper along with 3 additional days of Augmentin for possible GPC-C (staph aureus-MRSA) contaminated blood cultures. Coreg was because of bradycardia.  Was continued on home dose of Aldactone, Lasix and lisinopril along with Imdur.  Reviewed  CV studies:    The  following studies were reviewed today: (if available, images/films reviewed: From Epic Chart or Care Everywhere) TTE 05/14/2021: EF 55 to 60%.  No R WMA.  GR 1 DD.   Mild LA dilation. Normal PAP and RAP.  Essentially normal.  Lower Extremity Venous Dopplers 05/14/2021: No evidence of DVT.  Ordered due to elevated D-dimer.   Interval History:   TRUMAINE WIMER returns  here today having just been discharged from the hospital.  He says that he is feeling little better but, still feels very very weak.  He feels tired and worn out.  He has no energy level, but is breathing better.  He does note that the swelling has improved.  No PND or orthopnea.  He still takes furosemide just about every day, and HCTZ is no longer listed.  In addition to his fatigue and generalized weakness that he is feeling following his hospitalization, he has 2 major concerns: He has a new smart watch and with this he sees his heart rate going down into the 40s as low as the mid 30s sometimes when he is sitting down resting.  He does say that it gets up into the 60s 70s and even 90s with activity.  He is concerned with the low heart rates, because when this is happening he feels very worn out.  He says this actually started before the hospitalization, continued despite being off carvedilol.  He still has off-and-on burning almost heartburn like or indigestion type symptoms in his chest.  Somewhat aching sensation that he notes with activity or exertion, for instance climbing up a hill or walking further distances at a rapid pace where his heart rate goes up.  It goes away when he rests.  This seems similar to when he had his stents placed, but is not happening all the time.  CV Review of Symptoms (Summary) Cardiovascular ROS: positive for - chest pain, dyspnea on exertion, edema, irregular heartbeat, and exertional chest pain noted above, associate with dyspnea.  Irregular heartbeat is actually slow.  Bradycardiac.  Edema well  controlled on current dose of Lasix. negative for - loss of consciousness, palpitations, paroxysmal nocturnal dyspnea, rapid heart rate, shortness of breath, or although he does get little bit dizzy and lightheaded with bradycardia, not with routine activity.  No syncope/near syncope or TIA/amaurosis fugax, claudication  REVIEWED OF SYSTEMS   Review of Systems  Constitutional:  Positive for malaise/fatigue (Extremely worn out now.  Much more so than usual.  Prehospital, he still had those fatigue episodes with bradycardia.) and weight loss (Since being in the hospital.  About 10+ pounds).  HENT:  Negative for congestion and nosebleeds.   Respiratory:  Positive for shortness of breath.   Cardiovascular:  Positive for chest pain and leg swelling (Much better control.).  Gastrointestinal:  Positive for constipation (Off and on). Negative for abdominal pain, blood in stool and melena.  Genitourinary:  Positive for frequency (Urinates a lot with Lasix.).  Musculoskeletal:  Negative for falls, joint pain and myalgias.  Neurological:  Positive for dizziness (Lightheaded and dizzy.), weakness (Feels weak and lethargic since his hospital stay.-Not unexpected) and headaches. Negative for speech change and focal weakness.  Psychiatric/Behavioral:  Negative for depression (Sounds a little like dysthymia.) and memory loss. The patient is nervous/anxious. The patient does not have insomnia.    I have reviewed and (if needed) personally updated the patient's problem list, medications, allergies, past medical and surgical history, social and family history.   PAST MEDICAL HISTORY   Past Medical History:  Diagnosis Date   Allergic rhinitis    Aortic valve sclerosis    Cause of murmur   Arthritis of knee, left, needs total Knee in future with Dr. Wynelle Link  08/07/2011   Bladder cancer ([place]) 11/2005   CAD S/P percutaneous coronary angioplasty 08/07/2011   NEW 08/07/11 cutting balloon PTCA to 95% ostial diag. and 90%  proximal LCX.  prior cutting balloon and to RCA  & ostium of ostial diag; Last Cath 02/2012 - Patent Diag PTCA, RCA & Circ BMS stents. Normal EF & EDP; Myoview 2/14 no evidence of ischemia or infarction   Diabetes mellitus type 2, controlled, with complications ([place])    Diabetes mellitus without complication ([place])    Dyslipidemia    History of heart attack 11/2007   "mild; did not affect the heart muscle"; PCI to RCA, Cutting PTCA Diag ostium   Hypertension    Macular degeneration    Macular degeneration of right eye    Now getting shots.    Melanoma of skin ([place]) ~ 2005   "level 2; on my back"   Myocardial infarction [facility]) 2012   Osteoarthritis    Status post right knee arthroplasty, positive for staph infection. Pending right arthroplasty   Portacath in place 08/11/2012   Statin intolerance 08/07/2011   Stroke ([place]) 1980's   "had facial strokes; 2; about a year apart; never did find what caused them"    PAST SURGICAL HISTORY   Past Surgical History:  Procedure Laterality Date   BIOPSY  05/28/2019   Procedure: BIOPSY;  Surgeon: Rogene Houston, MD;  Location: [facility];  Service: Endoscopy;;  cecum   CARDIAC CATHETERIZATION  August '13   Widely patent RCA and LCx stents. Also patent PTCA site to D1, ~40-50% D2. Normal EF, normal EDP   CATARACT EXTRACTION W/ INTRAOCULAR LENS IMPLANT  ~ 2010   right   CATARACT EXTRACTION W/PHACO Left 11/21/2016   Procedure: CATARACT EXTRACTION PHACO AND INTRAOCULAR LENS PLACEMENT (IOC) CDE - 11.70 ;  Surgeon: Tonny Branch, MD;  Location: [facility];  Service: Ophthalmology;  Laterality: Left;  left   cold cup removal bladder lesion  11/2005   "malignant"   COLONOSCOPY N/A 12/09/2014   Procedure: COLONOSCOPY;  Surgeon: Rogene Houston, MD;  Location: [facility];  Service: Endoscopy;  Laterality: N/A;  955 - moved to 8:30 - Ann to notify pt   COLONOSCOPY WITH PROPOFOL N/A 05/28/2019   Procedure: COLONOSCOPY WITH PROPOFOL;  Surgeon: Rogene Houston,  MD;  [facility];  Service: Endoscopy;  Laterality: N/A;   CORONARY ANGIOPLASTY WITH STENT PLACEMENT  11/2007   Mid RCA - Driver BMS 3.5 mm x 15 mm; 2.0 mm Cutting Balloon PTCA - ostial D1   CORONARY ANGIOPLASTY WITH STENT PLACEMENT  08/07/2011   Abnormal TM Myoview - for exertional throat discomfort: Patent RCA stent, 90% circumflex -- Vision BMS 3.5 mm x 12 mm --> 4.2 mm. Ostial D2 95% -- 2.25 mm Cutting Balloon PTCA   DOPPLER ECHOCARDIOGRAPHY  11/2007   Normal EF, impaired relaxation, with sclerotic aortic valve. No stenosis.   INGUINAL HERNIA REPAIR  ~ 1970   left   KNEE ARTHROSCOPY  02/20/2011   left   LEFT HEART CATHETERIZATION WITH CORONARY ANGIOGRAM N/A 08/07/2011   Procedure: LEFT HEART CATHETERIZATION WITH CORONARY ANGIOGRAM;  Surgeon: Leonie Man, MD;  Location: [facility] CATH LAB;  Service: Cardiovascular;  Laterality: N/A;   LEFT HEART CATHETERIZATION WITH CORONARY ANGIOGRAM N/A 03/06/2012   Procedure: LEFT HEART CATHETERIZATION WITH CORONARY ANGIOGRAM;  Surgeon: Leonie Man, MD;  Location: [facility] CATH LAB;  Service: Cardiovascular;  Laterality: N/A;   NM MYOVIEW LTD  08/28/2012   Low risk, EF 49%, improved from prior. Small apical and apical septal defect, fixed likely artifact no skin or infarction.   PARTIAL KNEE ARTHROPLASTY  06/2003  right   PERCUTANEOUS CORONARY STENT INTERVENTION (PCI-S)  08/07/2011   Procedure: PERCUTANEOUS CORONARY STENT INTERVENTION (PCI-S);  Surgeon: Leonie Man, MD;  Location: [facility] CATH LAB;  Service: Cardiovascular;;   POLYPECTOMY  05/28/2019   Procedure: POLYPECTOMY;  Surgeon: Rogene Houston, MD;  [facility];  Service: Endoscopy;;  colon   TONSILLECTOMY     "as a child"   TOTAL KNEE ARTHROPLASTY  11/2003   right x2 and one on left- Total of 3.   TOTAL KNEE ARTHROPLASTY Left 09/28/2012   Procedure: TOTAL KNEE ARTHROPLASTY;  Surgeon: Gearlean Alf, MD;  Location: [facility];  Service: Orthopedics;  Laterality: Left;    TRANSTHORACIC ECHOCARDIOGRAM  05/14/2021   EF 55 to 60%.  No R WMA.  GR 1 DD.   Mild LA dilation. Normal PAP and RAP.  Essentially normal.    Immunization History  Administered Date(s) Administered   Influenza-Unspecified 05/29/2018   Tdap 02/01/2017    MEDICATIONS/ALLERGIES   Current Meds  Medication Sig   acetaminophen (TYLENOL) 650 MG CR tablet Take 650 mg by mouth every 8 (eight) hours as needed for pain.   Ascorbic Acid (VITAMIN C) 1000 MG tablet Take 1,000 mg by mouth 2 (two) times daily.   azelastine (ASTELIN) 0.1 % nasal spray USE 2 SPRAYS IN EACH NOSTRIL TWICE DAILY (Patient taking differently: 2 (two) times daily as needed.)   Cholecalciferol (VITAMIN D) 125 MCG (5000 UT) CAPS Take 5,000 Units by mouth daily.   clobetasol (TEMOVATE) 0.05 % external solution Apply 1 application topically daily as needed (irritation).    Coenzyme Q10 (CO Q-10) 100 MG CAPS Take 300 capsules by mouth 3 (three) times daily. (Patient taking differently: Take 400 mg by mouth daily.)   cyclobenzaprine (FLEXERIL) 10 MG tablet Take 1 tablet (10 mg total) by mouth 2 (two) times daily as needed for muscle spasms.   EPINEPHrine 0.3 mg/0.3 mL IJ SOAJ injection Inject 0.3 mg into the muscle as needed for anaphylaxis.    ezetimibe (ZETIA) 10 MG tablet Take 1 tablet (10 mg total) by mouth daily.   Fluticasone Propionate (XHANCE) 93 MCG/ACT EXHU Place 2 application into the nose 2 (two) times daily. (Patient taking differently: Place 2 application into the nose as needed (congestion).)   furosemide (LASIX) 20 MG tablet Take 1 tablet (20 mg total) by mouth as needed. for worsening swelling -- (take instead of HCTZ). (Patient taking differently: Take 20 mg by mouth daily. for worsening swelling -- (take instead of HCTZ).)   isosorbide mononitrate (IMDUR) 60 MG 24 hr tablet Take 1 tablet (60 mg total) by mouth daily.   lisinopril (ZESTRIL) 40 MG tablet TAKE 1/2 TABLET BY MOUTH DAILY (Patient taking differently: Take  20 mg by mouth daily.)   loperamide (IMODIUM) 2 MG capsule Take 1 capsule (2 mg total) by mouth 4 (four) times daily as needed for diarrhea or loose stools.   LOTEMAX 0.5 % OINT Place 1 application into the left eye daily as needed (irritation, dry eyes).   magnesium gluconate (MAGONATE) 500 MG tablet Take 500 mg by mouth daily.   metFORMIN (GLUCOPHAGE) 500 MG tablet Take 500 mg by mouth daily with breakfast.    Multiple Vitamins-Minerals (PRESERVISION AREDS PO) Take 1 capsule by mouth 2 (two) times daily.   predniSONE (DELTASONE) 10 MG tablet Take 4 tablets daily X 2 days, then, Take 3 tablets daily X 2 days, then, Take 2 tablets daily X 2 days, then, Take 1 tablets daily X  1 day.   saccharomyces boulardii (FLORASTOR) 250 MG capsule Take 1 capsule (250 mg total) by mouth 2 (two) times daily for 5 days.   simvastatin (ZOCOR) 20 MG tablet Take 1 tablet (20 mg total) by mouth at bedtime.   spironolactone (ALDACTONE) 25 MG tablet Take 1 tablet (25 mg total) by mouth daily.   tacrolimus (PROTOPIC) 0.1 % ointment Apply topically 2 (two) times daily. (Patient taking differently: Apply 1 application topically 2 (two) times daily as needed (irritation).)   tobramycin (TOBREX) 0.3 % ophthalmic solution Place 1 drop into both eyes daily as needed (before , during and after injection).   triazolam (HALCION) 0.125 MG tablet Take 1.5 tablets (0.1875 mg total) by mouth at bedtime. Pt takes 1.5 tabs at bedtime (Patient taking differently: 0.125 mg. Take 2 tablets at bedtime by mouth)   Turmeric 500 MG CAPS Take 1,000 mg by mouth daily.    Allergies  Allergen Reactions   Aspirin Other (See Comments)    Was told to not take this due to having macular degeneration   Rosuvastatin Calcium Other (See Comments)    Aches and cramps   Other Rash and Other (See Comments)    Red meat = develops a rash on the back    SOCIAL HISTORY/FAMILY HISTORY   Reviewed in Epic:  Pertinent findings:  Social History    Tobacco Use   Smoking status: Former    Packs/day: 2.00    Years: 28.00    Pack years: 56.00    Types: Cigarettes    Quit date: 07/29/1980    Years since quitting: 40.8   Smokeless tobacco: Never  Vaping Use   Vaping Use: Never used  Substance Use Topics   Alcohol use: Yes    Alcohol/week: 2.0 standard drinks    Types: 1 Glasses of wine, 1 Standard drinks or equivalent per week    Comment:  "wine or whiskey" occasionally   Drug use: No   Social History   Social History Narrative   He is a married father of 2, grandfather of 95. He is not really getting a lot of exercise now. He previously had been working out on a treadmill at least 2 times a day for 15 minutes at a time, but he   is not able to do that as much now because his knee hurts him too bad. Does not smoke and only occasional alcoholic beverage.    OBJCTIVE -PE, EKG, labs   Wt Readings from Last 3 Encounters:  05/21/21 249 lb 12.8 oz (113.3 kg)  05/15/21 256 lb 2.8 oz (116.2 kg)  03/12/21 260 lb 3.2 oz (118 kg)    Physical Exam: BP 136/78   Pulse (!) 51   Ht 6' (1.829 m)   Wt 249 lb 12.8 oz (113.3 kg)   SpO2 94%   BMI 33.88 kg/m  Physical Exam Vitals reviewed.  Constitutional:      General: He is not in acute distress.    Appearance: Normal appearance. He is ill-appearing (Still looks sick, but better.). He is not toxic-appearing.     Comments: Nontoxic-appearing, but does seem to be somewhat tired and fatigued.  Not yet back to his robust self.  HENT:     Head: Normocephalic and atraumatic.     Mouth/Throat:     Mouth: Mucous membranes are moist.     Pharynx: Oropharynx is clear.  Eyes:     Extraocular Movements: Extraocular movements intact.  Cardiovascular:     Rate  and Rhythm: Regular rhythm. Bradycardia present.     Pulses: Normal pulses.     Heart sounds: Normal heart sounds.  Pulmonary:     Effort: Pulmonary effort is normal.     Breath sounds: No wheezing, rhonchi or rales.     Comments:  Mild diffuse interstitial sounds.  No obvious rales or rhonchi. Abdominal:     General: Abdomen is flat. Bowel sounds are normal.     Palpations: Abdomen is soft. There is no mass.  Musculoskeletal:        General: No swelling.     Cervical back: Normal range of motion.  Skin:    General: Skin is warm and dry.     Comments: Mild brawny venous stasis changes in bilateral ankles.  Neurological:     General: No focal deficit present.     Mental Status: He is alert and oriented to person, place, and time.     Motor: No weakness.     Gait: Gait abnormal.  Psychiatric:        Mood and Affect: Mood normal.        Behavior: Behavior normal.        Thought Content: Thought content normal.        Judgment: Judgment normal.     Adult ECG Report  Rate: 51 ;  Rhythm: normal sinus rhythm and RBBB. Normal axis, intervals & durations ;   Narrative Interpretation: stable.   Recent Labs:  reviewed.   Lab Results  Component Value Date   CHOL 153 04/04/2021   HDL 38 (L) 04/04/2021   LDLCALC 94 04/04/2021   TRIG 115 04/04/2021   CHOLHDL 4.0 04/04/2021   Lab Results  Component Value Date   CREATININE 0.93 05/16/2021   BUN 22 05/16/2021   NA 132 (L) 05/16/2021   K 4.2 05/16/2021   CL 101 05/16/2021   CO2 25 05/16/2021   CBC Latest Ref Rng & Units 05/16/2021 05/15/2021 05/14/2021  WBC 4.0 - 10.5 K/uL 7.7 9.6 12.7(H)  Hemoglobin 13.0 - 17.0 g/dL 12.4(L) 12.2(L) 12.5(L)  Hematocrit 39.0 - 52.0 % 38.6(L) 36.1(L) 38.2(L)  Platelets 150 - 400 K/uL 149(L) 123(L) 135(L)    Lab Results  Component Value Date   HGBA1C 6.9 (H) 05/13/2021   Lab Results  Component Value Date   TSH 1.760 08/21/2020    ==================================================  COVID-19 Education: The signs and symptoms of COVID-19 were discussed with the patient and how to seek care for testing (follow up with PCP or arrange E-visit).    I spent a total of 40  minutes with the patient spent in direct patient  consultation.  Additional time spent with chart review  / charting (studies, outside notes, etc): 35 min -> Chart reviewed with recent hospitalization, echocardiogram etc. Total Time: 75 min  Current medicines are reviewed at length with the patient today.  (+/- concerns) -- allowing for statin Holiday  This visit occurred during the SARS-CoV-2 public health emergency.  Safety protocols were in place, including screening questions prior to the visit, additional usage of staff PPE, and extensive cleaning of exam room while observing appropriate contact time as indicated for disinfecting solutions.  Notice: This dictation was prepared with Dragon dictation along with smart phrase technology. Any transcriptional errors that result from this process are unintentional and may not be corrected upon review.  Patient Instructions / Medication Changes & Studies & Tests Ordered   Patient Instructions  Medication Instructions:   Do not take your statin  medication until  Dec 25 , 2022- STATIN HOLIDAY _ SIMVASTATIN  *If you need a refill on your cardiac medications before your next appointment, please call your pharmacy*   Lab Work:  Not needed   Testing/Procedures: Will be mailed to your home   the first week of Dec 2022 Your physician has recommended that you wear a holter monitor 7 days ZIO . Holter monitors are medical devices that record the heart's electrical activity. Doctors most often use these monitors to diagnose arrhythmias. Arrhythmias are problems with the speed or rhythm of the heartbeat. The monitor is a small, portable device. You can wear one while you do your normal daily activities. This is usually used to diagnose what is causing palpitations/syncope (passing out).    Follow-Up: At [facility], you and your health needs are our priority.  As part of our continuing mission to provide you with exceptional heart care, we have created designated Provider Care Teams.  These Care  Teams include your primary Cardiologist (physician) and Advanced Practice Providers (APPs -  Physician Assistants and Nurse Practitioners) who all work together to provide you with the care you need, when you need it.     Your next appointment:   3 month(s)  The format for your next appointment:   In Person  Provider:   Glenetta Hew, MD   Other Instructions  ZIO XT- Long Term Monitor Instructions  Your physician has requested you wear a ZIO patch monitor for 7 days.  This is a single patch monitor. [employer] supplies one patch monitor per enrollment. Additional stickers are not available. Please do not apply patch if you will be having a Nuclear Stress Test,  Echocardiogram, Cardiac CT, MRI, or Chest Xray during the period you would be wearing the  monitor. The patch cannot be worn during these tests. You cannot remove and re-apply the  ZIO XT patch monitor.  Your ZIO patch monitor will be mailed 3 day USPS to your address on file. It may take 3-5 days  to receive your monitor after you have been enrolled.  Once you have received your monitor, please review the enclosed instructions. Your monitor  has already been registered assigning a specific monitor serial # to you.  Billing and Patient Assistance Program Information  We have supplied [employer] with any of your insurance information on file for billing purposes. [employer] offers a sliding scale Patient Assistance Program for patients that do not have  insurance, or whose insurance does not completely cover the cost of the ZIO monitor.  You must apply for the Patient Assistance Program to qualify for this discounted rate.  To apply, please call [employer] at [phone], select option 4, select option 2, ask to apply for  Patient Assistance Program. Theodore Demark will ask your household income, and how many people  are in your household. They will quote your out-of-pocket cost based on that information.  [employer] will also be able to set up  a 61-month, interest-free payment plan if needed.  Applying the monitor   Shave hair from upper left chest.  Hold abrader disc by orange tab. Rub abrader in 40 strokes over the upper left chest as  indicated in your monitor instructions.  Clean area with 4 enclosed alcohol pads. Let dry.  Apply patch as indicated in monitor instructions. Patch will be placed under collarbone on left  side of chest with arrow pointing upward.  Rub patch adhesive wings for 2 minutes. Remove white label marked "1". Remove the  white  label marked "2". Rub patch adhesive wings for 2 additional minutes.  While looking in a mirror, press and release button in center of patch. A small green light will  flash 3-4 times. This will be your only indicator that the monitor has been turned on.  Do not shower for the first 24 hours. You may shower after the first 24 hours.  Press the button if you feel a symptom. You will hear a small click. Record Date, Time and  Symptom in the Patient Logbook.  When you are ready to remove the patch, follow instructions on the last 2 pages of Patient  Logbook. Stick patch monitor onto the last page of Patient Logbook.  Place Patient Logbook in the blue and white box. Use locking tab on box and tape box closed  securely. The blue and white box has prepaid postage on it. Please place it in the mailbox as  soon as possible. Your physician should have your test results approximately 7 days after the  monitor has been mailed back to [facility].  Call [place] at [phone] if you have questions regarding  your ZIO XT patch monitor. Call them immediately if you see an orange light blinking on your  monitor.  If your monitor falls off in less than 4 days, contact our Monitor department at [phone].  If your monitor becomes loose or falls off after 4 days call [employer] at [phone] for  suggestions on securing your monitor    Studies Ordered:   Orders  Placed This Encounter  Procedures   LONG TERM MONITOR (3-14 DAYS)   EKG 12-Lead     Glenetta Hew, M.D., M.S. Interventional Cardiologist   Pager # [phone] Phone # [phone] [place], [place] [address]   Thank you for choosing Heartcare at [facility]!!

## 2021-05-21 NOTE — Assessment & Plan Note (Signed)
Has been borderline blood pressure today.  With him having significant fatigue issues I am not to push too hard.  He tells me they are better than this at home.  He is only on lisinopril and Imdur along with spironolactone and Lasix. We do have room to gradually increase the spironolactone if necessary, but may need to consider hydralazine. Wanting to avoid amlodipine since his edema is so well controlled off of it altogether.

## 2021-05-21 NOTE — Assessment & Plan Note (Signed)
Stress test hopefully work for him.  He was doing pretty well until his COVID but now he is noticing this exertional chest burning sensation.  My plan for now will be to allow him to recover from his COVID infection just to make sure that this is nothing related to that.  He still has Imdur which we have increased to 60 mg.  If he has bad spells he is not taking extra dose.  We will reassess in January timeframe to determine if he potentially would require evaluation at that time.  Not able to tolerate beta-blocker, however if were not able to control his angina without it, may need to consider pacemaker placement to allow [place] to use beta-blocker.

## 2021-05-21 NOTE — Assessment & Plan Note (Signed)
He had been doing pretty well with no recurrent anginal symptoms at the time of his last visit with me.  Now that he is post COVID infection, he has had some exertional chest tightness and pressure as well as dyspnea.  Concerning for possible recurrent classic stable angina.  Continue Imdur.  Unfortunately, we have discontinued both beta-blocker and calcium channel blocker-calcium channel blocker because of edema and beta-blocker because of bradycardia.  Plan:   Does not seem to be back on aspirin since his hospitalization, we will need to readdress this in follow-up and make sure that he is back on it.  No longer on Plavix.  Not able to tolerate beta-blocker-bradycardia, amlodipine-edema  On Imdur and ACE inhibitor  Borderline statin tolerance.  Did not allow for a statin holiday continue with Zetia alone.  (Has been intolerant of both rosuvastatin and atorvastatin). ->  Pending CVRR reassessment after follow-up labs in January.

## 2021-05-21 NOTE — Progress Notes (Unsigned)
Patient enrolled for [employer] to mail a 7 day ZIO XT monitor on 07/02/21.

## 2021-05-22 DIAGNOSIS — H353221 Exudative age-related macular degeneration, left eye, with active choroidal neovascularization: Secondary | ICD-10-CM | POA: Diagnosis not present

## 2021-05-24 DIAGNOSIS — U071 COVID-19: Secondary | ICD-10-CM | POA: Diagnosis not present

## 2021-05-24 DIAGNOSIS — Z6834 Body mass index (BMI) 34.0-34.9, adult: Secondary | ICD-10-CM | POA: Diagnosis not present

## 2021-05-24 DIAGNOSIS — I2510 Atherosclerotic heart disease of native coronary artery without angina pectoris: Secondary | ICD-10-CM | POA: Diagnosis not present

## 2021-06-14 ENCOUNTER — Encounter (HOSPITAL_COMMUNITY): Payer: Self-pay | Admitting: Radiology

## 2021-06-19 DIAGNOSIS — B351 Tinea unguium: Secondary | ICD-10-CM | POA: Diagnosis not present

## 2021-06-19 DIAGNOSIS — E1142 Type 2 diabetes mellitus with diabetic polyneuropathy: Secondary | ICD-10-CM | POA: Diagnosis not present

## 2021-06-26 DIAGNOSIS — Z6834 Body mass index (BMI) 34.0-34.9, adult: Secondary | ICD-10-CM | POA: Diagnosis not present

## 2021-06-26 DIAGNOSIS — I10 Essential (primary) hypertension: Secondary | ICD-10-CM | POA: Diagnosis not present

## 2021-06-26 DIAGNOSIS — K869 Disease of pancreas, unspecified: Secondary | ICD-10-CM | POA: Diagnosis not present

## 2021-06-26 DIAGNOSIS — E6609 Other obesity due to excess calories: Secondary | ICD-10-CM | POA: Diagnosis not present

## 2021-06-27 ENCOUNTER — Other Ambulatory Visit: Payer: Self-pay | Admitting: Cardiology

## 2021-06-27 ENCOUNTER — Other Ambulatory Visit (HOSPITAL_COMMUNITY): Payer: Self-pay | Admitting: Physician Assistant

## 2021-06-27 ENCOUNTER — Other Ambulatory Visit: Payer: Self-pay | Admitting: Physician Assistant

## 2021-06-27 DIAGNOSIS — K869 Disease of pancreas, unspecified: Secondary | ICD-10-CM

## 2021-06-27 DIAGNOSIS — R972 Elevated prostate specific antigen [PSA]: Secondary | ICD-10-CM | POA: Diagnosis not present

## 2021-06-28 HISTORY — PX: OTHER SURGICAL HISTORY: SHX169

## 2021-07-04 DIAGNOSIS — H353221 Exudative age-related macular degeneration, left eye, with active choroidal neovascularization: Secondary | ICD-10-CM | POA: Diagnosis not present

## 2021-07-05 ENCOUNTER — Other Ambulatory Visit: Payer: Self-pay

## 2021-07-05 ENCOUNTER — Telehealth: Payer: Self-pay | Admitting: Cardiology

## 2021-07-05 ENCOUNTER — Ambulatory Visit: Payer: Medicare Other | Admitting: Cardiology

## 2021-07-05 ENCOUNTER — Encounter: Payer: Self-pay | Admitting: Cardiology

## 2021-07-05 VITALS — BP 160/92 | HR 51 | Ht 72.0 in | Wt 243.0 lb

## 2021-07-05 DIAGNOSIS — R350 Frequency of micturition: Secondary | ICD-10-CM | POA: Diagnosis not present

## 2021-07-05 DIAGNOSIS — I160 Hypertensive urgency: Secondary | ICD-10-CM | POA: Diagnosis not present

## 2021-07-05 DIAGNOSIS — R3912 Poor urinary stream: Secondary | ICD-10-CM | POA: Diagnosis not present

## 2021-07-05 DIAGNOSIS — R972 Elevated prostate specific antigen [PSA]: Secondary | ICD-10-CM | POA: Diagnosis not present

## 2021-07-05 DIAGNOSIS — N401 Enlarged prostate with lower urinary tract symptoms: Secondary | ICD-10-CM | POA: Diagnosis not present

## 2021-07-05 MED ORDER — HYDRALAZINE HCL 10 MG PO TABS: 10.0000 mg | ORAL_TABLET | Freq: Three times a day (TID) | ORAL | 3 refills | Status: DC

## 2021-07-05 NOTE — Patient Instructions (Signed)
Medication Instructions:  Take hydralazine 10 mg three times each day.  *If you need a refill on your cardiac medications before your next appointment, please call your pharmacy*   Follow-Up: At [facility], you and your health needs are our priority.  As part of our continuing mission to provide you with exceptional heart care, we have created designated Provider Care Teams.  These Care Teams include your primary Cardiologist (physician) and Advanced Practice Providers (APPs -  Physician Assistants and Nurse Practitioners) who all work together to provide you with the care you need, when you need it.  We recommend signing up for the patient portal called "MyChart".  Sign up information is provided on this After Visit Summary.  MyChart is used to connect with patients for Virtual Visits (Telemedicine).  Patients are able to view lab/test results, encounter notes, upcoming appointments, etc.  Non-urgent messages can be sent to your provider as well.   To learn more about what you can do with MyChart, go to [email or web address].    Your next appointment:    Keep your January 13th appointment at 10 am  The format for your next appointment:   In Person  Provider:   Glenetta Hew, MD

## 2021-07-05 NOTE — Progress Notes (Signed)
Cardiology Office Note   Date:  07/05/2021   ID:  Arthur Lutz 1941/05/22, MRN [id number]  PCP:  Redmond School, MD  Cardiologist:   Glenetta Hew, MD   Chief Complaint  Patient presents with   Hypertensive Urgency      History of Present Illness: Arthur Lutz is a 80 y.o. male who is typically seen by Dr. Ellyn Hack.   He has a history of CAD.  He called today because he was having some hypertensive urgency.  Blood pressures were above 200.  He was feeling shaky.  His wife reports that he had a headache.  He says he was having a migraine.  She was 233/97 last night.  He talked to a relative who is an EMT and took an extra lisinopril.  An hour later he was down to 212.  Gradually they think improved as his headache went down to a mild discomfort that he is having currently.  He went to see a urologist this morning and his blood pressure was one 184/72.  Of note he had seen his primary care since last seen Dr. Ellyn Hack and because blood pressures were not controlled he had his lisinopril increased.  It looks like he also stopped his Coreg.  Also the patient stopped taking his spironolactone on his own because he was urinating all night.  He is not describing other new symptoms but he is chronically been weak and stumbling.  He says this is not changed since we last saw Dr. Ellyn Hack.  He had this after he was hospitalized.  He was hospitalized in October with severe COVID.  He had acute renal insufficiency.  I did review these records for this visit.  Of note I reviewed the records from Dr. Ellyn Hack at the last visit.  Plan was potentially to add hydralazine or potentially go up on spironolactone.  He wanted to avoid amlodipine because of swelling.  He has not tolerated beta-blockers because of bradycardia .   Past Medical History:  Diagnosis Date   Allergic rhinitis    Aortic valve sclerosis    Cause of murmur   Arthritis of knee, left, needs total Knee in future with Dr. Wynelle Link   08/07/2011   Bladder cancer ([place]) 11/2005   CAD S/P percutaneous coronary angioplasty 08/07/2011   NEW 08/07/11 cutting balloon PTCA to 95% ostial diag. and 90%  proximal LCX.  prior cutting balloon and to RCA  & ostium of ostial diag; Last Cath 02/2012 - Patent Diag PTCA, RCA & Circ BMS stents. Normal EF & EDP; Myoview 2/14 no evidence of ischemia or infarction   Diabetes mellitus type 2, controlled, with complications ([place])    Diabetes mellitus without complication ([place])    Dyslipidemia    History of heart attack 11/2007   "mild; did not affect the heart muscle"; PCI to RCA, Cutting PTCA Diag ostium   Hypertension    Macular degeneration    Macular degeneration of right eye    Now getting shots.    Melanoma of skin ([place]) ~ 2005   "level 2; on my back"   Myocardial infarction [facility]) 2012   Osteoarthritis    Status post right knee arthroplasty, positive for staph infection. Pending right arthroplasty   Portacath in place 08/11/2012   Statin intolerance 08/07/2011   Stroke ([place]) 1980's   "had facial strokes; 2; about a year apart; never did find what caused them"    Past Surgical History:  Procedure Laterality Date  BIOPSY  05/28/2019   Procedure: BIOPSY;  Surgeon: Rogene Houston, MD;  Location: [facility] ENDO SUITE;  Service: Endoscopy;;  cecum   CARDIAC CATHETERIZATION  August '13   Widely patent RCA and LCx stents. Also patent PTCA site to D1, ~40-50% D2. Normal EF, normal EDP   CATARACT EXTRACTION W/ INTRAOCULAR LENS IMPLANT  ~ 2010   right   CATARACT EXTRACTION W/PHACO Left 11/21/2016   Procedure: CATARACT EXTRACTION PHACO AND INTRAOCULAR LENS PLACEMENT (IOC) CDE - 11.70 ;  Surgeon: Tonny Branch, MD;  Location: [facility] ORS;  Service: Ophthalmology;  Laterality: Left;  left   cold cup removal bladder lesion  11/2005   "malignant"   COLONOSCOPY N/A 12/09/2014   Procedure: COLONOSCOPY;  Surgeon: Rogene Houston, MD;  Location: [facility] END[facility];  Service: Endoscopy;  Laterality: N/A;  955 - moved to 8:30  - Ann to notify pt   COLONOSCOPY WITH PROPOFOL N/A 05/28/2019   Procedure: COLONOSCOPY WITH PROPOFOL;  Surgeon: Rogene Houston, MD;  Location: [facility];  Service: Endoscopy;  Laterality: N/A;   CORONARY ANGIOPLASTY WITH STENT PLACEMENT  11/2007   Mid RCA - Driver BMS 3.5 mm x 15 mm; 2.0 mm Cutting Balloon PTCA - ostial D1   CORONARY ANGIOPLASTY WITH STENT PLACEMENT  08/07/2011   Abnormal TM Myoview - for exertional throat discomfort: Patent RCA stent, 90% circumflex -- Vision BMS 3.5 mm x 12 mm --> 4.2 mm. Ostial D2 95% -- 2.25 mm Cutting Balloon PTCA   DOPPLER ECHOCARDIOGRAPHY  11/2007   Normal EF, impaired relaxation, with sclerotic aortic valve. No stenosis.   INGUINAL HERNIA REPAIR  ~ 1970   left   KNEE ARTHROSCOPY  02/20/2011   left   LEFT HEART CATHETERIZATION WITH CORONARY ANGIOGRAM N/A 08/07/2011   Procedure: LEFT HEART CATHETERIZATION WITH CORONARY ANGIOGRAM;  Surgeon: Leonie Man, MD;  Location: [facility] C[facility] LAB;  Service: Cardiovascular;  Laterality: N/A;   LEFT HEART CATHETERIZATION WITH CORONARY ANGIOGRAM N/A 03/06/2012   Procedure: LEFT HEART CATHETERIZATION WITH CORONARY ANGIOGRAM;  Surgeon: Leonie Man, MD;  Location: [facility] CATH LAB;  Service: Cardiovascular;  Laterality: N/A;   NM MYOVIEW LTD  08/28/2012   Low risk, EF 49%, improved from prior. Small apical and apical septal defect, fixed likely artifact no skin or infarction.   PARTIAL KNEE ARTHROPLASTY  06/2003   right   PERCUTANEOUS CORONARY STENT INTERVENTION (PCI-S)  08/07/2011   Procedure: PERCUTANEOUS CORONARY STENT INTERVENTION (PCI-S);  Surgeon: Leonie Man, MD;  Location: [facility] CATH LAB;  Service: Cardiovascular;;   POLYPECTOMY  05/28/2019   Procedure: POLYPECTOMY;  Surgeon: Rogene Houston, MD;  Location: [facility];  Service: Endoscopy;;  colon   TONSILLECTOMY     "as a child"   TOTAL KNEE ARTHROPLASTY  11/2003   right x2 and one on left- Total of 3.   TOTAL KNEE ARTHROPLASTY Left 09/28/2012    Procedure: TOTAL KNEE ARTHROPLASTY;  Surgeon: Gearlean Alf, MD;  Location: [facility] ORS;  Service: Orthopedics;  Laterality: Left;   TRANSTHORACIC ECHOCARDIOGRAM  05/14/2021   EF 55 to 60%.  No R WMA.  GR 1 DD.   Mild LA dilation. Normal PAP and RAP.  Essentially normal.     Current Outpatient Medications  Medication Sig Dispense Refill   acetaminophen (TYLENOL) 650 MG CR tablet Take 650 mg by mouth every 8 (eight) hours as needed for pain.     Ascorbic Acid (VITAMIN C) 1000 MG tablet Take 1,000 mg by mouth  2 (two) times daily.     azelastine (ASTELIN) 0.1 % nasal spray USE 2 SPRAYS IN EACH NOSTRIL TWICE DAILY (Patient taking differently: 2 (two) times daily as needed.) 90 mL 1   cetirizine (ZYRTEC ALLERGY) 10 MG tablet Take 1 tablet (10 mg total) by mouth daily. 30 tablet 3   Cholecalciferol (VITAMIN D) 125 MCG (5000 UT) CAPS Take 5,000 Units by mouth daily.     clobetasol (TEMOVATE) 0.05 % external solution Apply 1 application topically daily as needed (irritation).      Coenzyme Q10 (CO Q-10) 100 MG CAPS Take 300 capsules by mouth 3 (three) times daily. (Patient taking differently: Take 300 mg by mouth daily.) 90 each 11   cyclobenzaprine (FLEXERIL) 10 MG tablet Take 1 tablet (10 mg total) by mouth 2 (two) times daily as needed for muscle spasms. 20 tablet 0   EPINEPHrine 0.3 mg/0.3 mL IJ SOAJ injection Inject 0.3 mg into the muscle as needed for anaphylaxis.      ezetimibe (ZETIA) 10 MG tablet Take 1 tablet (10 mg total) by mouth daily. 90 tablet 3   Fluticasone Propionate (XHANCE) 93 MCG/ACT EXHU Place 2 application into the nose 2 (two) times daily. (Patient taking differently: Place 2 application into the nose as needed (congestion).) 32 mL 6   hydrALAZINE (APRESOLINE) 10 MG tablet Take 1 tablet (10 mg total) by mouth 3 (three) times daily. 270 tablet 3   isosorbide mononitrate (IMDUR) 60 MG 24 hr tablet Take 1 tablet (60 mg total) by mouth daily. 30 tablet 2   lisinopril (ZESTRIL) 40 MG  tablet TAKE 1/2 TABLET BY MOUTH DAILY (Patient taking differently: Take 40 mg by mouth daily.) 45 tablet 2   loperamide (IMODIUM) 2 MG capsule Take 1 capsule (2 mg total) by mouth 4 (four) times daily as needed for diarrhea or loose stools. 12 capsule 0   LOTEMAX 0.5 % OINT Place 1 application into the left eye daily as needed (irritation, dry eyes).     magnesium gluconate (MAGONATE) 500 MG tablet Take 500 mg by mouth daily.     metFORMIN (GLUCOPHAGE) 500 MG tablet Take 500 mg by mouth daily with breakfast.      Multiple Vitamins-Minerals (PRESERVISION AREDS PO) Take 1 capsule by mouth 2 (two) times daily.     prednisoLONE acetate (PRED FORTE) 1 % ophthalmic suspension Place 1 drop into both eyes 2 (two) times daily as needed (irritation).     predniSONE (DELTASONE) 10 MG tablet Take 4 tablets daily X 2 days, then, Take 3 tablets daily X 2 days, then, Take 2 tablets daily X 2 days, then, Take 1 tablets daily X 1 day. 19 tablet 0   simvastatin (ZOCOR) 20 MG tablet Take 1 tablet (20 mg total) by mouth at bedtime. 90 tablet 3   tacrolimus (PROTOPIC) 0.1 % ointment Apply topically 2 (two) times daily. (Patient taking differently: Apply 1 application topically 2 (two) times daily as needed (irritation).) 100 g 0   tobramycin (TOBREX) 0.3 % ophthalmic solution Place 1 drop into both eyes daily as needed (before , during and after injection).     triazolam (HALCION) 0.125 MG tablet Take 1.5 tablets (0.1875 mg total) by mouth at bedtime. Pt takes 1.5 tabs at bedtime (Patient taking differently: 0.125 mg. Take 2 tablets at bedtime by mouth) 30 tablet 5   Turmeric 500 MG CAPS Take 1,000 mg by mouth daily.     furosemide (LASIX) 20 MG tablet Take 1 tablet (20 mg total)  by mouth as needed. for worsening swelling -- (take instead of HCTZ). (Patient not taking: Reported on 07/05/2021) 45 tablet 3   spironolactone (ALDACTONE) 25 MG tablet Take 1 tablet (25 mg total) by mouth daily. (Patient not taking: Reported on  07/05/2021) 90 tablet 1   No current facility-administered medications for this visit.    Allergies:   Aspirin, Rosuvastatin calcium, and Other    ROS:  Please see the history of present illness.   Otherwise, review of systems are positive for none.   All other systems are reviewed and negative.    PHYSICAL EXAM: VS:  BP (!) 160/92 (BP Location: Right Arm, Patient Position: Sitting, Cuff Size: Large)   Pulse (!) 51   Ht 6' (1.829 m)   Wt 243 lb (110.2 kg)   BMI 32.96 kg/m  , BMI Body mass index is 32.96 kg/m. GENERAL:  Well appearing HEENT:  Pupils equal round and reactive, fundi not visualized, oral mucosa unremarkable NECK:  No jugular venous distention, waveform within normal limits, carotid upstroke brisk and symmetric, no bruits, no thyromegaly LYMPHATICS:  No cervical, inguinal adenopathy LUNGS:  Clear to auscultation bilaterally BACK:  No CVA tenderness CHEST:  Unremarkable HEART:  PMI not displaced or sustained,S1 and S2 within normal limits, no S3, no S4, no clicks, no rubs, no murmurs ABD:  Flat, positive bowel sounds normal in frequency in pitch, no bruits, no rebound, no guarding, no midline pulsatile mass, no hepatomegaly, no splenomegaly EXT:  2 plus pulses throughout, mild leg edema, no cyanosis no clubbing SKIN:  No rashes no nodules NEURO:  Cranial nerves II through XII grossly intact, motor grossly intact throughout PSYCH:  Cognitively intact, oriented to person place and time    EKG:  EKG is not ordered today.    Recent Labs: 08/21/2020: TSH 1.760 05/12/2021: B Natriuretic Peptide 8.9 05/16/2021: ALT 28; BUN 22; Creatinine, Ser 0.93; Hemoglobin 12.4; Magnesium 2.2; Platelets 149; Potassium 4.2; Sodium 132    Lipid Panel    Component Value Date/Time   CHOL 153 04/04/2021 0743   TRIG 115 04/04/2021 0743   HDL 38 (L) 04/04/2021 0743   CHOLHDL 4.0 04/04/2021 0743   CHOLHDL 3.7 03/20/2016 0716   VLDL 23 03/20/2016 0716   LDLCALC 94 04/04/2021 0743       Wt Readings from Last 3 Encounters:  07/05/21 243 lb (110.2 kg)  05/21/21 249 lb 12.8 oz (113.3 kg)  05/15/21 256 lb 2.8 oz (116.2 kg)      Other studies Reviewed: Additional studies/ records that were reviewed today include: Hospital records. Review of the above records demonstrates:  Please see elsewhere in the note.     ASSESSMENT AND PLAN:  HYPERTENSIVE URGENCY: The patient was seen today for hypertensive urgency.  He does not want to take spironolactone again.  Dr. Ellyn Hack wanted to avoid amlodipine.  Therefore, I have started hydralazine 10 mg 3 times daily.  I told his wife that he could use an extra 10 mg for any hypotensive episodes.  He will keep a blood pressure diary.  He has follow-up already scheduled.  WEAKNESS: The patient did have a monitor that chest was delivered today and he will wear this and follow-up on his weakness and bradycardia arrhythmia.   CAD: He is not having any active ischemic symptoms.  He will continue with risk reduction.  Current medicines are reviewed at length with the patient today.  The patient does not have concerns regarding medicines.  The following changes have  been made: As above  Labs/ tests ordered today include: None No orders of the defined types were placed in this encounter.    Disposition:   FU with Dr. Ellyn Hack as previously scheduled   Signed, Minus Breeding, MD  07/05/2021 7:08 PM    [place]

## 2021-07-05 NOTE — Telephone Encounter (Signed)
Spoke to patient . Patient states his blood pressure was elevated last evening  and ws rechecked several times by his wife. He developed a headache as well . Shaky, stumbling. Systolic was in the 450  range.   Patient states this morning went to see urologist office - blood pressure was 184/92   Recommend contacting cardiologist.    Patient  schedule appointment  for today with D.O.D at 3:30 pm. patient verbalized understanding

## 2021-07-05 NOTE — Telephone Encounter (Signed)
Pt c/o BP issue: STAT if pt c/o blurred vision, one-sided weakness or slurred speech  1. What are your last 5 BP readings? 223/91; 200 plus range each time last night    2. Are you having any other symptoms (ex. Dizziness, headache, blurred vision, passed out)? Dizziness, heachache, passing   3. What is your BP issue? High wasn't able to sleep

## 2021-07-06 ENCOUNTER — Telehealth: Payer: Self-pay | Admitting: Cardiology

## 2021-07-06 DIAGNOSIS — Z9861 Coronary angioplasty status: Secondary | ICD-10-CM | POA: Diagnosis not present

## 2021-07-06 DIAGNOSIS — R001 Bradycardia, unspecified: Secondary | ICD-10-CM

## 2021-07-06 DIAGNOSIS — I2510 Atherosclerotic heart disease of native coronary artery without angina pectoris: Secondary | ICD-10-CM | POA: Diagnosis not present

## 2021-07-06 DIAGNOSIS — I160 Hypertensive urgency: Secondary | ICD-10-CM

## 2021-07-06 MED ORDER — HYDRALAZINE HCL 10 MG PO TABS: 10.0000 mg | ORAL_TABLET | Freq: Three times a day (TID) | ORAL | 3 refills | Status: DC

## 2021-07-06 NOTE — Telephone Encounter (Signed)
Noted. Medication sent to [facility] in [place], [place] as requested.

## 2021-07-06 NOTE — Telephone Encounter (Signed)
*  STAT* If patient is at the pharmacy, call can be transferred to refill team.   1. Which medications need to be refilled? (please list name of each medication and dose if known) hydrALAZINE (APRESOLINE) 10 MG tablet  2. Which pharmacy/location (including street and city if local pharmacy) is medication to be sent to? [facility] [place], [place]  3. Do they need a 30 day or 90 day supply?  90 days  Pt no longer used [place], please remove from pts meds list. Moving forward all scriipts need to be fwd'd to [employer]

## 2021-07-09 ENCOUNTER — Ambulatory Visit (HOSPITAL_COMMUNITY)
Admission: RE | Admit: 2021-07-09 | Discharge: 2021-07-09 | Disposition: A | Discharge status: Home / Self Care | Payer: Medicare Other | Source: Ambulatory Visit | Attending: Physician Assistant | Admitting: Physician Assistant

## 2021-07-09 ENCOUNTER — Encounter (HOSPITAL_COMMUNITY): Payer: Self-pay

## 2021-07-09 ENCOUNTER — Other Ambulatory Visit: Payer: Self-pay

## 2021-07-09 DIAGNOSIS — K869 Disease of pancreas, unspecified: Secondary | ICD-10-CM

## 2021-07-10 ENCOUNTER — Other Ambulatory Visit: Payer: Self-pay

## 2021-07-10 ENCOUNTER — Telehealth: Payer: Self-pay | Admitting: Cardiology

## 2021-07-10 ENCOUNTER — Ambulatory Visit: Payer: Medicare Other | Admitting: Cardiology

## 2021-07-10 DIAGNOSIS — I160 Hypertensive urgency: Secondary | ICD-10-CM

## 2021-07-10 MED ORDER — HYDRALAZINE HCL 25 MG PO TABS
25.0000 mg | ORAL_TABLET | Freq: Three times a day (TID) | ORAL | 3 refills | Status: DC
Start: 2021-07-10 — End: 2021-07-12

## 2021-07-10 NOTE — Telephone Encounter (Signed)
Called patient, he was seen as DOD on 12/08- Dr.Hochrein advised to start hydralazine 10 mg tid, and using one if needed. Patient states yesterday he took 5 pills to get the BP down. He has been running 200/100's since this visit, and today about 30 minutes ago was the first time he had a BP reading that was less than that. It was 174/75 30 minutes ago. Patient states he does not have any other symptoms other than a headache, and some nausea. He states he is still concerned with having it elevated. At last visit Dr.Hochrein suggested to keep appointment with Dr.Harding that was scheduled today (it was cancelled and moved to 08/10/2020) I did schedule patient to see PA this week Angie Duke at 3:10 PM on 12/15. Patient verbalized understanding of this appointment.   Patient advised to seek ED visit if notice any stoke symptoms. Patient aware.  I will route to [facility] and MD to advise of anything further.

## 2021-07-10 NOTE — Telephone Encounter (Signed)
Called patient, advised of message below from [facility].   Patient verbalized understanding.  RX sent to pharmacy.

## 2021-07-10 NOTE — Telephone Encounter (Signed)
I would recommend switching from hydralazine 10mg  to hydralazine 25mg  TID since it sounds like he needs a stronger dose.

## 2021-07-10 NOTE — Telephone Encounter (Signed)
Pt c/o BP issue: STAT if pt c/o blurred vision, one-sided weakness or slurred speech  1. What are your last 5 BP readings?  07/09/21 211/100 11:30 PM 07/10/21 187/79 has taken two hydralazine today   2. Are you having any other symptoms (ex. Dizziness, headache, blurred vision, passed out)? Headache   3. What is your BP issue? Hypertension continuing with new medication.    Pt c/o medication issue:  1. Name of Medication:   hydrALAZINE (APRESOLINE) 10 MG tablet   2. How are you currently taking this medication (dosage and times per day)? Has been taking 6 tablets a day   3. Are you having a reaction (difficulty breathing--STAT)? No   4. What is your medication issue? Still having hypertension  after doubling this medication

## 2021-07-11 NOTE — Progress Notes (Signed)
Office Visit    Patient Name: Arthur Lutz Date of Encounter: 07/12/2021  PCP:  [facility]  Cardiologist:  Glenetta Hew, MD  Advanced Practice Provider:  No care team member to display Electrophysiologist:  None    Chief Complaint    Arthur Lutz is a 80 y.o. male with a hx of CAD status post PCI, diabetes mellitus type 2, hypertension, hyperlipidemia, history of bladder cancer May 2007, and stroke presents today for follow-up of his hypertension.  Past Medical History    Past Medical History:  Diagnosis Date   Allergic rhinitis    Aortic valve sclerosis    Cause of murmur   Arthritis of knee, left, needs total Knee in future with Dr. Wynelle Link  08/07/2011   Bladder cancer ([place]) 11/2005   CAD S/P percutaneous coronary angioplasty 08/07/2011   NEW 08/07/11 cutting balloon PTCA to 95% ostial diag. and 90%  proximal LCX.  prior cutting balloon and to RCA  & ostium of ostial diag; Last Cath 02/2012 - Patent Diag PTCA, RCA & Circ BMS stents. Normal EF & EDP; Myoview 2/14 no evidence of ischemia or infarction   Diabetes mellitus type 2, controlled, with complications ([place])    Diabetes mellitus without complication ([place])    Dyslipidemia    History of heart attack 11/2007   "mild; did not affect the heart muscle"; PCI to RCA, Cutting PTCA Diag ostium   Hypertension    Macular degeneration    Macular degeneration of right eye    Now getting shots.    Melanoma of skin ([place]) ~ 2005   "level 2; on my back"   Myocardial infarction [facility]) 2012   Osteoarthritis    Status post right knee arthroplasty, positive for staph infection. Pending right arthroplasty   Portacath in place 08/11/2012   Statin intolerance 08/07/2011   Stroke ([place]) 1980's   "had facial strokes; 2; about a year apart; never did find what caused them"   Past Surgical History:  Procedure Laterality Date   BIOPSY  05/28/2019   Procedure: BIOPSY;  Surgeon: Rogene Houston, MD;   Location: [facility];  Service: Endoscopy;;  cecum   CARDIAC CATHETERIZATION  August '13   Widely patent RCA and LCx stents. Also patent PTCA site to D1, ~40-50% D2. Normal EF, normal EDP   CATARACT EXTRACTION W/ INTRAOCULAR LENS IMPLANT  ~ 2010   right   CATARACT EXTRACTION W/PHACO Left 11/21/2016   Procedure: CATARACT EXTRACTION PHACO AND INTRAOCULAR LENS PLACEMENT (IOC) CDE - 11.70 ;  Surgeon: Tonny Branch, MD;  Location: [facility] ORS;  Service: Ophthalmology;  Laterality: Left;  left   cold cup removal bladder lesion  11/2005   "malignant"   COLONOSCOPY N/A 12/09/2014   Procedure: COLONOSCOPY;  Surgeon: Rogene Houston, MD;  Location: [facility];  Service: Endoscopy;  Laterality: N/A;  955 - moved to 8:30 - Ann to notify pt   COLONOSCOPY WITH PROPOFOL N/A 05/28/2019   Procedure: COLONOSCOPY WITH PROPOFOL;  Surgeon: Rogene Houston, MD;  [facility];  Service: Endoscopy;  Laterality: N/A;   CORONARY ANGIOPLASTY WITH STENT PLACEMENT  11/2007   Mid RCA - Driver BMS 3.5 mm x 15 mm; 2.0 mm Cutting Balloon PTCA - ostial D1   CORONARY ANGIOPLASTY WITH STENT PLACEMENT  08/07/2011   Abnormal TM Myoview - for exertional throat discomfort: Patent RCA stent, 90% circumflex -- Vision BMS 3.5 mm x 12 mm -->  4.2 mm. Ostial D2 95% -- 2.25 mm Cutting Balloon PTCA   DOPPLER ECHOCARDIOGRAPHY  11/2007   Normal EF, impaired relaxation, with sclerotic aortic valve. No stenosis.   INGUINAL HERNIA REPAIR  ~ 1970   left   KNEE ARTHROSCOPY  02/20/2011   left   LEFT HEART CATHETERIZATION WITH CORONARY ANGIOGRAM N/A 08/07/2011   Procedure: LEFT HEART CATHETERIZATION WITH CORONARY ANGIOGRAM;  Surgeon: Leonie Man, MD;  Location: [facility] CATH LAB;  Service: Cardiovascular;  Laterality: N/A;   LEFT HEART CATHETERIZATION WITH CORONARY ANGIOGRAM N/A 03/06/2012   Procedure: LEFT HEART CATHETERIZATION WITH CORONARY ANGIOGRAM;  Surgeon: Leonie Man, MD;  Location: [facility] CATH LAB;  Service: Cardiovascular;   Laterality: N/A;   NM MYOVIEW LTD  08/28/2012   Low risk, EF 49%, improved from prior. Small apical and apical septal defect, fixed likely artifact no skin or infarction.   PARTIAL KNEE ARTHROPLASTY  06/2003   right   PERCUTANEOUS CORONARY STENT INTERVENTION (PCI-S)  08/07/2011   Procedure: PERCUTANEOUS CORONARY STENT INTERVENTION (PCI-S);  Surgeon: Leonie Man, MD;  Location: [facility] CATH LAB;  Service: Cardiovascular;;   POLYPECTOMY  05/28/2019   Procedure: POLYPECTOMY;  Surgeon: Rogene Houston, MD;  [facility];  Service: Endoscopy;;  colon   TONSILLECTOMY     "as a child"   TOTAL KNEE ARTHROPLASTY  11/2003   right x2 and one on left- Total of 3.   TOTAL KNEE ARTHROPLASTY Left 09/28/2012   Procedure: TOTAL KNEE ARTHROPLASTY;  Surgeon: Gearlean Alf, MD;  Location: [facility] ORS;  Service: Orthopedics;  Laterality: Left;   TRANSTHORACIC ECHOCARDIOGRAM  05/14/2021   EF 55 to 60%.  No R WMA.  GR 1 DD.   Mild LA dilation. Normal PAP and RAP.  Essentially normal.    Allergies  Allergies  Allergen Reactions   Aspirin Other (See Comments)    Was told to not take this due to having macular degeneration   Rosuvastatin Calcium Other (See Comments)    Aches and cramps   Other Rash and Other (See Comments)    Red meat = develops a rash on the back    History of Present Illness    Arthur Lutz is a 80 y.o. male with a hx of CAD status post PCI, diabetes mellitus type 2, hypertension, hyperlipidemia, history of bladder cancer May 2007, and stroke presents today for follow-up of his hypertension last seen 07/05/2021 by Dr. Percival Spanish.  Dr. Percival Spanish attended his last appointment when he was having hypertensive urgency.  Blood pressures were above 825 systolic.  He was feeling shaky.  His wife reported that he had a headache.  His wife stated his blood pressure was 233/97 the night prior to his appointment.  He spoke to a relative who is an EMT and took an extra lisinopril.  An hour later  he was down to 053 systolic.  Gradually his headache improved.  He went to see the urologist the morning before his appointment with [place] and his blood pressure was 184/72.  His Coreg was also stopped by Dr. Ellyn Hack.  The patient stopped his spironolactone due to urinating all night.  He is not describing any other new symptoms and is chronically been weak and stumbling.  He was recently hospitalized in October with severe COVID.  He had acute renal insufficiency.  The plan was potentially to add hydralazine at that point or potentially go up on his renal lactone.  Patient wanted to avoid amlodipine due to  swelling.  He has not been able to tolerate beta-blockers because of bradycardia.  He was started on hydralazine 10 mg 3 times daily.  Dr. Percival Spanish told his wife that he could take an extra 10 mg for any hypertensive episodes.  He was told to keep a blood pressure diary and to follow-up with our office.  Today, his biggest complaint is his constipation.  He has tried multiple laxatives, stool softeners, and suppositories with little relief.  He does have a MRI of the abdomen scheduled for next week.  His CT scan did not show small bowel obstruction but was positive for diverticulosis.  The family was also told that he has an enlarged appendix.  He has been wearing his Zio patch for bradycardia and gets it removed tomorrow.  He has not had any chest pain.  He comes in today for a blood pressure check.  Today in the clinic his blood pressure is running 170/94.  He brings in a blood pressure log which shows blood pressures over 643 systolic.  Many options were discussed with the patient today but ultimately we decided to increase his hydralazine from 25 mg 3 times daily to 50 mg 3 times daily.  He was taken off of his spironolactone and Lasix since he was urinating so frequently.  His weight continues to trend down.  He does have 1+ pitting edema in his lower extremity on exam today but when he wakes up in the  morning he says that he does not have any edema.  Usually his lower extremity swells throughout the day.  He is encouraged to wear TED hose and he was given literature on elastic therapy today during his appointment.  We discussed his recent lipid panel which showed his LDL was above goal at 94.  His LDL should be below 70 based on his CAD and really below 55 based on newer data.  He does not tolerate statin therapy and is just on Zetia for his cholesterol.  We discussed some of the injectable options such as Repatha and he is not interested in an injection.  We will draw another lipid panel and LFTs to reevaluate his cholesterol.  If it still elevated he is aware he may need to start a different medication.  We also referred him to the lipid clinic.  We will plan to bring him back in 2 to 3 weeks for another blood pressure check after this medication adjustment.  He understands that we may continue to increase the hydralazine based on what his new blood pressure log shows.  Reports no shortness of breath nor dyspnea on exertion. Reports no chest pain, pressure, or tightness. No orthopnea, PND. Reports no palpitations.     EKGs/Labs/Other Studies Reviewed:   The following studies were reviewed today: Echocardiogram 05/12/2021  IMPRESSIONS     1. Left ventricular ejection fraction, by estimation, is 55 to 60%. The  left ventricle has normal function. The left ventricle has no regional  wall motion abnormalities. Left ventricular diastolic parameters are  consistent with Grade I diastolic  dysfunction (impaired relaxation).   2. Right ventricular systolic function is normal. The right ventricular  size is normal. There is normal pulmonary artery systolic pressure. The  estimated right ventricular systolic pressure is 32.9 mmHg.   3. Left atrial size was mildly dilated.   4. The mitral valve is normal in structure. No evidence of mitral valve  regurgitation. No evidence of mitral stenosis.   5.  The aortic valve  is calcified. Aortic valve regurgitation is not  visualized. Mild to moderate aortic valve sclerosis/calcification is  present, without any evidence of aortic stenosis.   6. The inferior vena cava is normal in size with greater than 50%  respiratory variability, suggesting right atrial pressure of 3 mmHg.   FINDINGS   Left Ventricle: Left ventricular ejection fraction, by estimation, is 55  to 60%. The left ventricle has normal function. The left ventricle has no  regional wall motion abnormalities. The left ventricular internal cavity  size was normal in size. There is   no left ventricular hypertrophy. Left ventricular diastolic parameters  are consistent with Grade I diastolic dysfunction (impaired relaxation).   Right Ventricle: The right ventricular size is normal. No increase in  right ventricular wall thickness. Right ventricular systolic function is  normal. There is normal pulmonary artery systolic pressure. The tricuspid  regurgitant velocity is 2.61 m/s, and   with an assumed right atrial pressure of 3 mmHg, the estimated right  ventricular systolic pressure is 63.0 mmHg.   Left Atrium: Left atrial size was mildly dilated.   Right Atrium: Right atrial size was normal in size.   Pericardium: There is no evidence of pericardial effusion.   Mitral Valve: The mitral valve is normal in structure. No evidence of  mitral valve regurgitation. No evidence of mitral valve stenosis.   Tricuspid Valve: The tricuspid valve is normal in structure. Tricuspid  valve regurgitation is mild . No evidence of tricuspid stenosis.   Aortic Valve: The aortic valve is calcified. Aortic valve regurgitation is  not visualized. Mild to moderate aortic valve sclerosis/calcification is  present, without any evidence of aortic stenosis.   Pulmonic Valve: The pulmonic valve was normal in structure. Pulmonic valve  regurgitation is not visualized. No evidence of pulmonic stenosis.    Aorta: The aortic root is normal in size and structure.   Venous: The inferior vena cava is normal in size with greater than 50%  respiratory variability, suggesting right atrial pressure of 3 mmHg.   IAS/Shunts: No atrial level shunt detected by color flow Doppler.    EKG:  EKG is not ordered today.    Recent Labs: 08/21/2020: TSH 1.760 05/12/2021: B Natriuretic Peptide 8.9 05/16/2021: ALT 28; BUN 22; Creatinine, Ser 0.93; Hemoglobin 12.4; Magnesium 2.2; Platelets 149; Potassium 4.2; Sodium 132  Recent Lipid Panel    Component Value Date/Time   CHOL 153 04/04/2021 0743   TRIG 115 04/04/2021 0743   HDL 38 (L) 04/04/2021 0743   CHOLHDL 4.0 04/04/2021 0743   CHOLHDL 3.7 03/20/2016 0716   VLDL 23 03/20/2016 0716   LDLCALC 94 04/04/2021 0743     Home Medications   Current Meds  Medication Sig   acetaminophen (TYLENOL) 650 MG CR tablet Take 650 mg by mouth every 8 (eight) hours as needed for pain.   ezetimibe (ZETIA) 10 MG tablet Take 1 tablet (10 mg total) by mouth daily.   hydrALAZINE (APRESOLINE) 50 MG tablet Take 1 tablet (50 mg total) by mouth 3 (three) times daily.   isosorbide mononitrate (IMDUR) 60 MG 24 hr tablet Take 1 tablet (60 mg total) by mouth daily.   lisinopril (ZESTRIL) 40 MG tablet TAKE 1/2 TABLET BY MOUTH DAILY (Patient taking differently: Take 40 mg by mouth daily.)   LOTEMAX 0.5 % OINT Place 1 application into the left eye daily as needed (irritation, dry eyes).   magnesium gluconate (MAGONATE) 500 MG tablet Take 500 mg by mouth daily.   metFORMIN (  GLUCOPHAGE) 500 MG tablet Take 500 mg by mouth daily with breakfast.    tacrolimus (PROTOPIC) 0.1 % ointment Apply topically 2 (two) times daily. (Patient taking differently: Apply 1 application topically 2 (two) times daily as needed (irritation).)   tobramycin (TOBREX) 0.3 % ophthalmic solution Place 1 drop into both eyes daily as needed (before , during and after injection).   triazolam (HALCION) 0.125 MG  tablet Take 1.5 tablets (0.1875 mg total) by mouth at bedtime. Pt takes 1.5 tabs at bedtime (Patient taking differently: 0.125 mg. Take 2 tablets at bedtime by mouth)   [DISCONTINUED] hydrALAZINE (APRESOLINE) 25 MG tablet Take 1 tablet (25 mg total) by mouth 3 (three) times daily.     Review of Systems      All other systems reviewed and are otherwise negative except as noted above.  Physical Exam    VS:  BP (!) 170/94 (BP Location: Left Arm, Patient Position: Sitting, Cuff Size: Large)    Pulse 93    Ht 6' (1.829 m)    Wt 245 lb 6.4 oz (111.3 kg)    SpO2 95%    BMI 33.28 kg/m  , BMI Body mass index is 33.28 kg/m.  Wt Readings from Last 3 Encounters:  07/12/21 245 lb 6.4 oz (111.3 kg)  07/05/21 243 lb (110.2 kg)  05/21/21 249 lb 12.8 oz (113.3 kg)     GEN: Well nourished, well developed, in no acute distress. HEENT: normal. Neck: Supple, no JVD, carotid bruits, or masses. Cardiac: irregularly regular, no murmurs, rubs, or gallops. No clubbing, cyanosis, edema.  Radials/PT 2+ and equal bilaterally.  Respiratory:  Respirations regular and unlabored, clear to auscultation bilaterally. GI: Soft, nontender, nondistended. + bowel sounds MS: No deformity or atrophy. Skin: Warm and dry, no rash. Neuro:  Strength and sensation are intact. Psych: Normal affect.  Assessment & Plan    Hypertensive urgency -Encouraged to continue to use blood pressure at home and keep a log -On hydralazine 25 mg 3 times daily, Imdur 60 mg daily, lisinopril 40 mg daily -Continue low-sodium diet -Blood pressure in the clinic today was 170/94, at home he occasionally is over 710 systolic -We increased his hydralazine to 50mg  TID -Encouraged low-sodium diet  Weakness -Patient was given a zio monitor to wear -He does have a history of bradycardia and does not tolerate beta-blockers -His monitor is removed tomorrow and will be sent in for evaluation at that time  CAD -No current chest pain -Continue  GDMT: asa not on due to (macular degeneration),  Zetia, lisinopril, Imdur  Hyperlipidemia -Continue Zetia -Statin allergy -Recent lipid panel showed total cholesterol 153, HDL 38, LDL 94, triglycerides 115 drawn on 03/2021 -LDL goal less than 70 due to CAD -We discussed Repatha and the patient was not interested at this time in an injection.  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 Constipation -continue OTC laxatives and stool softeners -Can do a suppository as needed but not more than once a week  -MRI of the abdomen scheduled for next week  -+ bowel sounds on exam today -encouraged hydration and high fiber diet -He will need GI follow-up after MRI  Disposition: Follow up 2-3 weeks with Glenetta Hew, MD or APP.  Signed, Elgie Collard, PA-C 07/12/2021, 4:45 PM [facility]

## 2021-07-12 ENCOUNTER — Encounter: Payer: Self-pay | Admitting: Physician Assistant

## 2021-07-12 ENCOUNTER — Other Ambulatory Visit: Payer: Self-pay

## 2021-07-12 ENCOUNTER — Ambulatory Visit: Payer: Medicare Other | Admitting: Physician Assistant

## 2021-07-12 VITALS — BP 170/94 | HR 93 | Ht 72.0 in | Wt 245.4 lb

## 2021-07-12 DIAGNOSIS — I160 Hypertensive urgency: Secondary | ICD-10-CM

## 2021-07-12 DIAGNOSIS — E669 Obesity, unspecified: Secondary | ICD-10-CM | POA: Diagnosis not present

## 2021-07-12 DIAGNOSIS — E785 Hyperlipidemia, unspecified: Secondary | ICD-10-CM | POA: Diagnosis not present

## 2021-07-12 DIAGNOSIS — I2510 Atherosclerotic heart disease of native coronary artery without angina pectoris: Secondary | ICD-10-CM

## 2021-07-12 DIAGNOSIS — I639 Cerebral infarction, unspecified: Secondary | ICD-10-CM

## 2021-07-12 MED ORDER — HYDRALAZINE HCL 50 MG PO TABS: 50.0000 mg | ORAL_TABLET | Freq: Three times a day (TID) | ORAL | 3 refills | Status: DC

## 2021-07-12 NOTE — Patient Instructions (Addendum)
Medication Instructions:  Your physician has recommended you make the following change in your medication:  INCREASE  HYDRALAZINE 50 MG THREE TIMES DAILY. *If you need a refill on your cardiac medications before your next appointment, please call your pharmacy*   Lab Work: TO BE DONE IN [place]: LIPID, LFTS If you have labs (blood work) drawn today and your tests are completely normal, you will receive your results only by: [place] (if you have MyChart) OR A paper copy in the mail If you have any lab test that is abnormal or we need to change your treatment, we will call you to review the results.   Testing/Procedures: NONE   Follow-Up: At [facility], you and your health needs are our priority.  As part of our continuing mission to provide you with exceptional heart care, we have created designated Provider Care Teams.  These Care Teams include your primary Cardiologist (physician) and Advanced Practice Providers (APPs -  Physician Assistants and Nurse Practitioners) who all work together to provide you with the care you need, when you need it.  We recommend signing up for the patient portal called "MyChart".  Sign up information is provided on this After Visit Summary.  MyChart is used to connect with patients for Virtual Visits (Telemedicine).  Patients are able to view lab/test results, encounter notes, upcoming appointments, etc.  Non-urgent messages can be sent to your provider as well.   To learn more about what you can do with MyChart, go to [email or web address].    Your next appointment:   KEEP SCHEDULED FOLLOW-UP  YOU HAVE BEEN REFEREED TO THE LIPID CLINC   Other Instructions Please check your blood pressure and weight 1-2 times per day after peeing in the am and bring readings to next appointment

## 2021-07-17 DIAGNOSIS — E785 Hyperlipidemia, unspecified: Secondary | ICD-10-CM | POA: Diagnosis not present

## 2021-07-17 DIAGNOSIS — E1169 Type 2 diabetes mellitus with other specified complication: Secondary | ICD-10-CM | POA: Diagnosis not present

## 2021-07-18 LAB — HEPATIC FUNCTION PANEL
ALT: 19 IU/L (ref 0–44)
AST: 16 IU/L (ref 0–40)
Albumin: 4.5 g/dL (ref 3.7–4.7)
Alkaline Phosphatase: 86 IU/L (ref 44–121)
Bilirubin Total: 0.6 mg/dL (ref 0.0–1.2)
Bilirubin, Direct: 0.16 mg/dL (ref 0.00–0.40)
Total Protein: 6.2 g/dL (ref 6.0–8.5)

## 2021-07-18 LAB — LIPID PANEL
Chol/HDL Ratio: 4.1 ratio (ref 0.0–5.0)
Cholesterol, Total: 158 mg/dL (ref 100–199)
HDL: 39 mg/dL — ABNORMAL LOW (ref >39)
LDL Chol Calc (NIH): 93 mg/dL (ref 0–99)
Triglycerides: 146 mg/dL (ref 0–149)
VLDL Cholesterol Cal: 26 mg/dL (ref 5–40)

## 2021-07-19 ENCOUNTER — Encounter (HOSPITAL_COMMUNITY)
Admission: RE | Admit: 2021-07-19 | Discharge: 2021-07-19 | Disposition: A | Discharge status: Home / Self Care | Payer: Medicare Other | Source: Ambulatory Visit | Attending: Physician Assistant | Admitting: Physician Assistant

## 2021-07-19 ENCOUNTER — Other Ambulatory Visit: Payer: Self-pay

## 2021-07-19 DIAGNOSIS — I2510 Atherosclerotic heart disease of native coronary artery without angina pectoris: Secondary | ICD-10-CM | POA: Diagnosis not present

## 2021-07-19 DIAGNOSIS — Z452 Encounter for adjustment and management of vascular access device: Secondary | ICD-10-CM | POA: Diagnosis not present

## 2021-07-19 DIAGNOSIS — Z9861 Coronary angioplasty status: Secondary | ICD-10-CM | POA: Diagnosis not present

## 2021-07-19 DIAGNOSIS — R001 Bradycardia, unspecified: Secondary | ICD-10-CM | POA: Diagnosis not present

## 2021-07-19 MED ORDER — HEPARIN SOD (PORK) LOCK FLUSH 100 UNIT/ML IV SOLN
INTRAVENOUS | Status: AC
  Filled 2021-07-19: qty 5

## 2021-07-19 MED ORDER — SODIUM CHLORIDE 0.9% FLUSH: 10.0000 mL | INTRAVENOUS | Status: DC | PRN

## 2021-07-19 MED ORDER — HEPARIN SOD (PORK) LOCK FLUSH 100 UNIT/ML IV SOLN
500.0000 [IU] | Freq: Once | INTRAVENOUS | Status: AC
  Administered 2021-07-19: 15:00:00 500 [IU]

## 2021-07-26 ENCOUNTER — Ambulatory Visit (HOSPITAL_COMMUNITY)
Admission: RE | Admit: 2021-07-26 | Discharge: 2021-07-26 | Disposition: A | Discharge status: Home / Self Care | Payer: Medicare Other | Source: Ambulatory Visit | Attending: Physician Assistant | Admitting: Physician Assistant

## 2021-07-26 ENCOUNTER — Other Ambulatory Visit: Payer: Self-pay

## 2021-07-26 ENCOUNTER — Other Ambulatory Visit (HOSPITAL_COMMUNITY): Payer: Self-pay | Admitting: Physician Assistant

## 2021-07-26 DIAGNOSIS — K869 Disease of pancreas, unspecified: Secondary | ICD-10-CM | POA: Insufficient documentation

## 2021-07-26 DIAGNOSIS — K7689 Other specified diseases of liver: Secondary | ICD-10-CM | POA: Diagnosis not present

## 2021-07-26 DIAGNOSIS — K862 Cyst of pancreas: Secondary | ICD-10-CM | POA: Diagnosis not present

## 2021-07-26 DIAGNOSIS — N281 Cyst of kidney, acquired: Secondary | ICD-10-CM | POA: Diagnosis not present

## 2021-07-26 MED ORDER — GADOBUTROL 1 MMOL/ML IV SOLN
10.0000 mL | Freq: Once | INTRAVENOUS | Status: AC | PRN
  Administered 2021-07-26: 18:00:00 10 mL via INTRAVENOUS

## 2021-07-30 NOTE — Progress Notes (Addendum)
Office Visit    Patient Name: Arthur [facility] Date of Encounter: 07/12/2021  PCP:  [facility], [facility]  Cardiologist:  Glenetta Hew, MD  Advanced Practice Provider:  No care team member to display Electrophysiologist:  None    Chief Complaint    OAKLAND FANT is a 81 y.o. male with a hx of CAD status post PCI, diabetes mellitus type 2, hypertension, hyperlipidemia, history of bladder cancer May 2007, and stroke presents today for follow-up of his hypertension.  Past Medical History    Past Medical History:  Diagnosis Date   Allergic rhinitis    Aortic valve sclerosis    Cause of murmur   Arthritis of knee, left, needs total Knee in future with Dr. Wynelle Link  08/07/2011   Bladder cancer ([place]) 11/2005   CAD S/P percutaneous coronary angioplasty 08/07/2011   NEW 08/07/11 cutting balloon PTCA to 95% ostial diag. and 90%  proximal LCX.  prior cutting balloon and to RCA  & ostium of ostial diag; Last Cath 02/2012 - Patent Diag PTCA, RCA & Circ BMS stents. Normal EF & EDP; Myoview 2/14 no evidence of ischemia or infarction   Diabetes mellitus type 2, controlled, with complications ([facility])    Diabetes mellitus without complication ([facility])    Dyslipidemia    History of heart attack 11/2007   "mild; did not affect the heart muscle"; PCI to RCA, Cutting PTCA Diag ostium   Hypertension    Macular degeneration    Macular degeneration of right eye    Now getting shots.    Melanoma of skin ([facility]) ~ 2005   "level 2; on my back"   Myocardial infarction [facility]) 2012   Osteoarthritis    Status post right knee arthroplasty, positive for staph infection. Pending right arthroplasty   Portacath in place 08/11/2012   Statin intolerance 08/07/2011   Stroke (Arthur [facility]) 1980's   "had facial strokes; 2; about a year apart; never did find what caused them"   Past Surgical History:  Procedure Laterality Date   BIOPSY  05/28/2019   Procedure: BIOPSY;  Surgeon: Rogene Houston, MD;   [facility];  Service: Endoscopy;;  cecum   CARDIAC CATHETERIZATION  August '13   Widely patent RCA and LCx stents. Also patent PTCA site to D1, ~40-50% D2. Normal EF, normal EDP   CATARACT EXTRACTION W/ INTRAOCULAR LENS IMPLANT  ~ 2010   right   CATARACT EXTRACTION W/PHACO Left 11/21/2016   Procedure: CATARACT EXTRACTION PHACO AND INTRAOCULAR LENS PLACEMENT (IOC) CDE - 11.70 ;  Surgeon: Tonny Branch, MD;  Location: [facility];  Service: Ophthalmology;  Laterality: Left;  left   cold cup removal bladder lesion  11/2005   "malignant"   COLONOSCOPY N/A 12/09/2014   Procedure: COLONOSCOPY;  Surgeon: Rogene Houston, MD;  [facility];  Service: Endoscopy;  Laterality: N/A;  955 - moved to 8:30 - Ann to notify pt   COLONOSCOPY WITH PROPOFOL N/A 05/28/2019   Procedure: COLONOSCOPY WITH PROPOFOL;  Surgeon: Rogene Houston, MD;  [facility];  Service: Endoscopy;  Laterality: N/A;   CORONARY ANGIOPLASTY WITH STENT PLACEMENT  11/2007   Mid RCA - Driver BMS 3.5 mm x 15 mm; 2.0 mm Cutting Balloon PTCA - ostial D1   CORONARY ANGIOPLASTY WITH STENT PLACEMENT  08/07/2011   Abnormal TM Myoview - for exertional throat discomfort: Patent RCA stent, 90% circumflex -- Vision BMS 3.5 mm x 12 mm -->  4.2 mm. Ostial D2 95% -- 2.25 mm Cutting Balloon PTCA   DOPPLER ECHOCARDIOGRAPHY  11/2007   Normal EF, impaired relaxation, with sclerotic aortic valve. No stenosis.   INGUINAL HERNIA REPAIR  ~ 1970   left   KNEE ARTHROSCOPY  02/20/2011   left   LEFT HEART CATHETERIZATION WITH CORONARY ANGIOGRAM N/A 08/07/2011   Procedure: LEFT HEART CATHETERIZATION WITH CORONARY ANGIOGRAM;  Surgeon: Leonie Man, MD;  Location: [facility] C[facility];  Service: Cardiovascular;  Laterality: N/A;   LEFT HEART CATHETERIZATION WITH CORONARY ANGIOGRAM N/A 03/06/2012   Procedure: LEFT HEART CATHETERIZATION WITH CORONARY ANGIOGRAM;  Surgeon: Leonie Man, MD;  Location: [facility] CATH [facility];  Service: Cardiovascular;   Laterality: N/A;   NM MYOVIEW LTD  08/28/2012   Low risk, EF 49%, improved from prior. Small apical and apical septal defect, fixed likely artifact no skin or infarction.   PARTIAL KNEE ARTHROPLASTY  06/2003   right   PERCUTANEOUS CORONARY STENT INTERVENTION (PCI-S)  08/07/2011   Procedure: PERCUTANEOUS CORONARY STENT INTERVENTION (PCI-S);  Surgeon: Leonie Man, MD;  Location: [facility] C[facility];  Service: Cardiovascular;;   POLYPECTOMY  05/28/2019   Procedure: POLYPECTOMY;  Surgeon: Rogene Houston, MD;  [facility];  Service: Endoscopy;;  colon   TONSILLECTOMY     "as a child"   TOTAL KNEE ARTHROPLASTY  11/2003   right x2 and one on left- Total of 3.   TOTAL KNEE ARTHROPLASTY Left 09/28/2012   Procedure: TOTAL KNEE ARTHROPLASTY;  Surgeon: Gearlean Alf, MD;  Location: [facility];  Service: Orthopedics;  Laterality: Left;   TRANSTHORACIC ECHOCARDIOGRAM  05/14/2021   EF 55 to 60%.  No R WMA.  GR 1 DD.   Mild LA dilation. Normal PAP and RAP.  Essentially normal.    Allergies  Allergies  Allergen Reactions   Aspirin Other (See Comments)    Was told to not take this due to having macular degeneration   Rosuvastatin Calcium Other (See Comments)    Aches and cramps   Other Rash and Other (See Comments)    Red meat = develops a rash on the back    History of Present Illness    Arthur Lutz is a 81 y.o. male with a hx of CAD status post PCI, diabetes mellitus type 2, hypertension, hyperlipidemia, history of bladder cancer May 2007, and stroke presents today for follow-up of his hypertension last seen 07/05/2021 by Dr. Percival Spanish.  Dr. Percival Spanish attended his last appointment when he was having hypertensive urgency.  Blood pressures were above 921 systolic.  He was feeling shaky.  His wife reported that he had a headache.  His wife stated his blood pressure was 233/97 the night prior to his appointment.  He spoke to a relative who is an EMT and took an extra lisinopril.  An hour later  he was down to 194 systolic.  Gradually his headache improved.  He went to see the urologist the morning before his appointment with [place] and his blood pressure was 184/72.  His Coreg was also stopped by Dr. Ellyn Hack.  The patient stopped his spironolactone due to urinating all night.  He is not describing any other new symptoms and is chronically been weak and stumbling.  He was recently hospitalized in October with severe COVID.  He had acute renal insufficiency.  The plan was potentially to add hydralazine at that point or potentially go up on his renal lactone.  Patient wanted to avoid amlodipine due to  swelling.  He has not been able to tolerate beta-blockers because of bradycardia.  He was started on hydralazine 10 mg 3 times daily.  Dr. Percival Spanish told his wife that he could take an extra 10 mg for any hypertensive episodes.  He was told to keep a blood pressure diary and to follow-up with our office.  He was seen in the clinic a few weeks ago and his biggest complaint was his constipation.  He has tried multiple laxatives, stool softeners, and suppositories with little relief.  He does have a MRI of the abdomen scheduled for next week.  His CT scan did not show small bowel obstruction but was positive for diverticulosis.  The family was also told that he has an enlarged appendix.  He has been wearing his Zio patch for bradycardia and gets it removed tomorrow.  He has not had any chest pain.  He comes in today for a blood pressure check.  He was running 170/94 last time he was here.  He brought in a blood pressure log which shows blood pressures over 045 systolic.  Many options were discussed with the patient today but ultimately we decided to increase his hydralazine from 25 mg 3 times daily to 50 mg 3 times daily.  He was taken off of his spironolactone and Lasix since he was urinating so frequently.  His weight continues to trend down.  He did have 1+ pitting edema in his lower extremity on exam today but  when he wakes up in the morning he says that he does not have any edema.  Usually his lower extremity swelled throughout the day.  He is encouraged to wear TED hose and he was given literature on elastic therapy during his appointment.  We discussed his recent lipid panel which showed his LDL was above goal at 94.  His LDL should be below 70 based on his CAD and really below 55 based on newer data.  He does not tolerate statin therapy and is just on Zetia for his cholesterol.  We discussed some of the injectable options such as Repatha and he is not interested in an injection.  We will draw another lipid panel and LFTs to reevaluate his cholesterol.  If it still elevated he is aware he may need to start a different medication.  We also referred him to the lipid clinic.  We will plan to bring him back in 2 to 3 weeks for another blood pressure check after this medication adjustment.  He understands that we may continue to increase the hydralazine based on what his new blood pressure log shows.  Today, he returns to the clinic for a BP check and to discuss additional lipid lowering agents.Lipid panel from 12/20 shows LDL 93, HDL 39, total cholesterol 158, and triglycerides 146.  His blood pressure log showed systolic BP anywhere from 150s to 200.  There was a miscommunication from his last appointment and he did not increase his hydralazine as recommended.  He has only been taking 25 mg 3 times daily.  Apparently the 50 mg 3 times daily hydralazine prescription was sent to the incorrect pharmacy.  We have corrected this today.  We discussed taking his blood pressure after sitting for 10 to 15 minutes.  He should also follow a low-sodium diet.  We discussed his Zio patch results which showed mostly normal sinus rhythm with 19 episodes of SVT.  He was asymptomatic with these episodes.  He still does occasionally have weakness throughout the  day that comes and goes.  We also discussed his recent lipid panel and that  his LDL is not at goal.  He is not interested in any injectable medication and cannot tolerate statins.  He is currently on Zetia 10 mg daily.  He is set up for the lipid clinic on 1/13 to discuss further options.  He will continue to log his blood pressure daily and increase his hydralazine to 50mg  3 times a day.  A prescription has been sent to the correct pharmacy.  We will plan to see him back in 2 weeks for another blood pressure check.  Reports no shortness of breath nor dyspnea on exertion. Reports no chest pain, pressure, or tightness. No orthopnea, PND. Reports no palpitations.     EKGs/Labs/Other Studies Reviewed:   The following studies were reviewed today: Echocardiogram 05/12/2021  IMPRESSIONS     1. Left ventricular ejection fraction, by estimation, is 55 to 60%. The  left ventricle has normal function. The left ventricle has no regional  wall motion abnormalities. Left ventricular diastolic parameters are  consistent with Grade I diastolic  dysfunction (impaired relaxation).   2. Right ventricular systolic function is normal. The right ventricular  size is normal. There is normal pulmonary artery systolic pressure. The  estimated right ventricular systolic pressure is 29.4 mmHg.   3. Left atrial size was mildly dilated.   4. The mitral valve is normal in structure. No evidence of mitral valve  regurgitation. No evidence of mitral stenosis.   5. The aortic valve is calcified. Aortic valve regurgitation is not  visualized. Mild to moderate aortic valve sclerosis/calcification is  present, without any evidence of aortic stenosis.   6. The inferior vena cava is normal in size with greater than 50%  respiratory variability, suggesting right atrial pressure of 3 mmHg.   FINDINGS   Left Ventricle: Left ventricular ejection fraction, by estimation, is 55  to 60%. The left ventricle has normal function. The left ventricle has no  regional wall motion abnormalities. The left  ventricular internal cavity  size was normal in size. There is   no left ventricular hypertrophy. Left ventricular diastolic parameters  are consistent with Grade I diastolic dysfunction (impaired relaxation).   Right Ventricle: The right ventricular size is normal. No increase in  right ventricular wall thickness. Right ventricular systolic function is  normal. There is normal pulmonary artery systolic pressure. The tricuspid  regurgitant velocity is 2.61 m/s, and   with an assumed right atrial pressure of 3 mmHg, the estimated right  ventricular systolic pressure is 76.5 mmHg.   Left Atrium: Left atrial size was mildly dilated.   Right Atrium: Right atrial size was normal in size.   Pericardium: There is no evidence of pericardial effusion.   Mitral Valve: The mitral valve is normal in structure. No evidence of  mitral valve regurgitation. No evidence of mitral valve stenosis.   Tricuspid Valve: The tricuspid valve is normal in structure. Tricuspid  valve regurgitation is mild . No evidence of tricuspid stenosis.   Aortic Valve: The aortic valve is calcified. Aortic valve regurgitation is  not visualized. Mild to moderate aortic valve sclerosis/calcification is  present, without any evidence of aortic stenosis.   Pulmonic Valve: The pulmonic valve was normal in structure. Pulmonic valve  regurgitation is not visualized. No evidence of pulmonic stenosis.   Aorta: The aortic root is normal in size and structure.   Venous: The inferior vena cava is normal in size with greater  than 50%  respiratory variability, suggesting right atrial pressure of 3 mmHg.   IAS/Shunts: No atrial level shunt detected by color flow Doppler.    MRI of abdomen 07/26/2021  IMPRESSION: 12 mm simple appearing cystic lesion in the pancreatic tail, without significant change compared to previous CTs. Several other tiny less than 5 mm cystic lesions in the pancreatic body and tail. 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 Recommend continued follow-up by MRI in 2 years. This recommendation follows ACR consensus guidelines: Management of Incidental Pancreatic Cysts: A White Paper of the ACR Incidental Findings Committee. [place] 2094;[id number].   Multiple small benign hepatic and right renal cysts.    EKG:  EKG is not ordered today.    Recent Labs: 08/21/2020: TSH 1.760 05/12/2021: B Natriuretic Peptide 8.9 05/16/2021: ALT 28; BUN 22; Creatinine, Ser 0.93; Hemoglobin 12.4; Magnesium 2.2; Platelets 149; Potassium 4.2; Sodium 132  Recent Lipid Panel    Component Value Date/Time   CHOL 153 04/04/2021 0743   TRIG 115 04/04/2021 0743   HDL 38 (L) 04/04/2021 0743   CHOLHDL 4.0 04/04/2021 0743   CHOLHDL 3.7 03/20/2016 0716   VLDL 23 03/20/2016 0716   LDLCALC 94 04/04/2021 0743     Home Medications   Current Meds  Medication Sig   acetaminophen (TYLENOL) 650 MG CR tablet Take 650 mg by mouth every 8 (eight) hours as needed for pain.   ezetimibe (ZETIA) 10 MG tablet Take 1 tablet (10 mg total) by mouth daily.   hydrALAZINE (APRESOLINE) 50 MG tablet Take 1 tablet (50 mg total) by mouth 3 (three) times daily.   isosorbide mononitrate (IMDUR) 60 MG 24 hr tablet Take 1 tablet (60 mg total) by mouth daily.   lisinopril (ZESTRIL) 40 MG tablet TAKE 1/2 TABLET BY MOUTH DAILY (Patient taking differently: Take 40 mg by mouth daily.)   LOTEMAX 0.5 % OINT Place 1 application into the left eye daily as needed (irritation, dry eyes).   magnesium gluconate (MAGONATE) 500 MG tablet Take 500 mg by mouth daily.   metFORMIN (GLUCOPHAGE) 500 MG tablet Take 500 mg by mouth daily with breakfast.    tacrolimus (PROTOPIC) 0.1 % ointment Apply topically 2 (two) times daily. (Patient taking differently: Apply 1 application topically 2 (two) times daily as needed (irritation).)   tobramycin (TOBREX) 0.3 % ophthalmic solution Place 1 drop into both eyes daily  as needed (before , during and after injection).   triazolam (HALCION) 0.125 MG tablet Take 1.5 tablets (0.1875 mg total) by mouth at bedtime. Pt takes 1.5 tabs at bedtime (Patient taking differently: 0.125 mg. Take 2 tablets at bedtime by mouth)   [DISCONTINUED] hydrALAZINE (APRESOLINE) 25 MG tablet Take 1 tablet (25 mg total) by mouth 3 (three) times daily.     Review of Systems      All other systems reviewed and are otherwise negative except as noted above.  Physical Exam    Vitals:   07/31/21 0745  BP: (!) 158/80  Pulse: 68  SpO2: 96%     Wt Readings from Last 3 Encounters:  07/12/21 245 lb 6.4 oz (111.3 kg)  07/05/21 243 lb (110.2 kg)  05/21/21 249 lb 12.8 oz (113.3 kg)     GEN: Well nourished, well developed, in no acute distress. HEENT: normal. Neck: Supple, no JVD, carotid bruits, or masses. Cardiac: sinus bradycardia,  no murmurs, rubs, or gallops. No clubbing, cyanosis, edema.  Radials/PT 2+ and equal bilaterally.  Respiratory:  Respirations regular and unlabored, clear to auscultation bilaterally. GI: Soft, nontender, nondistended. + bowel sounds MS: No deformity or atrophy. Skin: Warm and dry, no rash. Neuro:  Strength and sensation are intact. Psych: Normal affect.  Assessment & Plan   Hypertensive urgency -Encouraged to continue to use blood pressure at home and keep a log -Currently taking hydralazine 25 mg 3 times daily, Imdur 60 mg daily, lisinopril 40 mg daily -Will check BMP today  -Continue low-sodium diet -Blood pressure in the clinic today was 158/80, at home he occasionally is over 527 systolic -We increased his hydralazine to 50mg  TID -If still not controlled we will increase to hydralazine to 75mg  TID -Encouraged low-sodium diet  Weakness -Patient was given a zio monitor to wear -He does have a history of bradycardia and does not tolerate beta-blockers -His monitor revealed heart rate of 35 bpm, maximum heart rate of 154 bpm, average heart  rate 67 bpm.  Predominantly NSR.  LBBB/IVCD was present.  19 SVT runs occurred. He did not have any symptoms during SVT episodes -He is encouraged to limit caffeine intake to 1 cup of coffee a day.  Decaf is preferred.  We discussed over-the-counter medications to avoid. -With patient's history of sinus bradycardia would not add a beta-blocker at this time  CAD -No current chest pain -Continue GDMT: asa not on due to (macular degeneration),  Zetia, lisinopril, Imdur  Hyperlipidemia -Continue Zetia -Statin allergy -Recent lipid panel showed total cholesterol 153, HDL 38, LDL 94, triglycerides 115 drawn on 03/2021 -LDL goal less than 70 due to CAD -We discussed Repatha and the patient was not interested at this time in an injection.  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 Constipation -continue OTC laxatives and stool softeners -Can do a suppository as needed but not more than once a week  -MRI of the abdomen listed above -+ bowel sounds on exam today -encouraged hydration and high fiber diet -He will need GI follow-up after MRI  Disposition: Follow up 2 weeks with Glenetta Hew, MD or APP for BP check.  Signed, Elgie Collard, PA-C 07/12/2021, 4:45 PM [facility]

## 2021-07-31 ENCOUNTER — Other Ambulatory Visit: Payer: Self-pay

## 2021-07-31 ENCOUNTER — Encounter: Payer: Self-pay | Admitting: Physician Assistant

## 2021-07-31 ENCOUNTER — Ambulatory Visit: Payer: Medicare Other | Admitting: Physician Assistant

## 2021-07-31 VITALS — BP 158/80 | HR 68 | Ht 72.0 in | Wt 246.6 lb

## 2021-07-31 DIAGNOSIS — I2510 Atherosclerotic heart disease of native coronary artery without angina pectoris: Secondary | ICD-10-CM

## 2021-07-31 DIAGNOSIS — R001 Bradycardia, unspecified: Secondary | ICD-10-CM

## 2021-07-31 DIAGNOSIS — I160 Hypertensive urgency: Secondary | ICD-10-CM

## 2021-07-31 DIAGNOSIS — R531 Weakness: Secondary | ICD-10-CM

## 2021-07-31 DIAGNOSIS — Z8673 Personal history of transient ischemic attack (TIA), and cerebral infarction without residual deficits: Secondary | ICD-10-CM

## 2021-07-31 DIAGNOSIS — K5900 Constipation, unspecified: Secondary | ICD-10-CM | POA: Diagnosis not present

## 2021-07-31 DIAGNOSIS — E785 Hyperlipidemia, unspecified: Secondary | ICD-10-CM | POA: Diagnosis not present

## 2021-07-31 MED ORDER — HYDRALAZINE HCL 50 MG PO TABS: 50.0000 mg | ORAL_TABLET | Freq: Three times a day (TID) | ORAL | 3 refills | Status: DC

## 2021-07-31 NOTE — Patient Instructions (Addendum)
Medication Instructions:  No changes *If you need a refill on your cardiac medications before your next appointment, please call your pharmacy*   Lab Work: BMP If you have labs (blood work) drawn today and your tests are completely normal, you will receive your results only by: [place] (if you have MyChart) OR A paper copy in the mail If you have any lab test that is abnormal or we need to change your treatment, we will call you to review the results.   Testing/Procedures: No Testing   Follow-Up: At [facility], you and your health needs are our priority.  As part of our continuing mission to provide you with exceptional heart care, we have created designated Provider Care Teams.  These Care Teams include your primary Cardiologist (physician) and Advanced Practice Providers (APPs -  Physician Assistants and Nurse Practitioners) who all work together to provide you with the care you need, when you need it.  We recommend signing up for the patient portal called "MyChart".  Sign up information is provided on this After Visit Summary.  MyChart is used to connect with patients for Virtual Visits (Telemedicine).  Patients are able to view lab/test results, encounter notes, upcoming appointments, etc.  Non-urgent messages can be sent to your provider as well.   To learn more about what you can do with MyChart, go to [email or web address].    Your next appointment:   2 week(s)  The format for your next appointment:   In Person  Provider:   Fabian Sharp, PA-C    Then, Glenetta Hew, MD will plan to see you again in  August 10, 2021     Other Instructions Take Blood pressure Daily. Recommended wearing Compression Stockings.

## 2021-08-02 DIAGNOSIS — E785 Hyperlipidemia, unspecified: Secondary | ICD-10-CM | POA: Diagnosis not present

## 2021-08-02 DIAGNOSIS — Z8673 Personal history of transient ischemic attack (TIA), and cerebral infarction without residual deficits: Secondary | ICD-10-CM | POA: Diagnosis not present

## 2021-08-02 DIAGNOSIS — R001 Bradycardia, unspecified: Secondary | ICD-10-CM | POA: Diagnosis not present

## 2021-08-02 DIAGNOSIS — K5900 Constipation, unspecified: Secondary | ICD-10-CM | POA: Diagnosis not present

## 2021-08-02 LAB — BASIC METABOLIC PANEL
BUN/Creatinine Ratio: 23 (ref 10–24)
BUN: 20 mg/dL (ref 8–27)
CO2: 26 mmol/L (ref 20–29)
Calcium: 9.3 mg/dL (ref 8.6–10.2)
Chloride: 104 mmol/L (ref 96–106)
Creatinine, Ser: 0.87 mg/dL (ref 0.76–1.27)
Glucose: 103 mg/dL — ABNORMAL HIGH (ref 70–99)
Potassium: 4.3 mmol/L (ref 3.5–5.2)
Sodium: 144 mmol/L (ref 134–144)
eGFR: 88 mL/min/{1.73_m2} (ref >59)

## 2021-08-03 ENCOUNTER — Other Ambulatory Visit: Payer: Self-pay | Admitting: Cardiology

## 2021-08-05 ENCOUNTER — Ambulatory Visit
Admission: EM | Admit: 2021-08-05 | Discharge: 2021-08-05 | Disposition: A | Discharge status: Home / Self Care | Payer: Medicare Other | Attending: Student | Admitting: Student

## 2021-08-05 ENCOUNTER — Ambulatory Visit (INDEPENDENT_AMBULATORY_CARE_PROVIDER_SITE_OTHER): Payer: Medicare Other

## 2021-08-05 ENCOUNTER — Other Ambulatory Visit: Payer: Self-pay

## 2021-08-05 DIAGNOSIS — M19022 Primary osteoarthritis, left elbow: Secondary | ICD-10-CM | POA: Diagnosis not present

## 2021-08-05 DIAGNOSIS — S5002XA Contusion of left elbow, initial encounter: Secondary | ICD-10-CM | POA: Diagnosis not present

## 2021-08-05 DIAGNOSIS — W19XXXA Unspecified fall, initial encounter: Secondary | ICD-10-CM | POA: Diagnosis not present

## 2021-08-05 DIAGNOSIS — W108XXA Fall (on) (from) other stairs and steps, initial encounter: Secondary | ICD-10-CM | POA: Diagnosis not present

## 2021-08-05 DIAGNOSIS — M25522 Pain in left elbow: Secondary | ICD-10-CM | POA: Diagnosis not present

## 2021-08-05 DIAGNOSIS — M19042 Primary osteoarthritis, left hand: Secondary | ICD-10-CM | POA: Diagnosis not present

## 2021-08-05 DIAGNOSIS — M79642 Pain in left hand: Secondary | ICD-10-CM

## 2021-08-05 DIAGNOSIS — Z043 Encounter for examination and observation following other accident: Secondary | ICD-10-CM | POA: Diagnosis not present

## 2021-08-05 MED ORDER — DICLOFENAC SODIUM 50 MG PO TBEC: DELAYED_RELEASE_TABLET | ORAL | 0 refills | Status: DC

## 2021-08-05 NOTE — ED Triage Notes (Signed)
Pt reports pain in the left elbow, pain and swelling in left and x 1 day. Reports h fell downstairs 1 day ago.

## 2021-08-05 NOTE — Discharge Instructions (Addendum)
-  Voltaren taken with food twice daily as needed -Elbow sling while pain persists -Follow-up with orthopedist as scheduled on 1/11 -Please check your blood pressure at home or at the pharmacy. If this continues to be >140/90, follow-up with your primary care provider for further blood pressure management/ medication titration. If you develop chest pain, shortness of breath, vision changes, the worst headache of your life- head straight to the ED or call 911.

## 2021-08-05 NOTE — ED Provider Notes (Signed)
[facility]    CSN: [id number] Arrival date & time: 08/05/21  1443      History   Chief Complaint Chief Complaint  Patient presents with   Fall   Elbow Pain   Hand Problem    HPI Arthur Lutz is a 81 y.o. male presenting with multiple MSK injuries following fall down stairs 1 day ago. States it was dark and walking down stairs in rental house. Missed last step so fell down one step.  Initially caught himself using the right hand on the railing, but then this slipped and so he fell onto the outstretched left hand.  Now with pain over the wrist, and elbow.  Also with skin tear over the elbow that paramedic daughter patched up.  Denies head trauma, LOC, headaches, dizziness, vision changes.  He does not take a blood thinner.  Denies hip pain, instability.  Denies abdominal pain, hematuria, change in bowel or bladder function. He is right handed.   HPI  Past Medical History:  Diagnosis Date   Allergic rhinitis    Aortic valve sclerosis    Cause of murmur   Arthritis of knee, left, needs total Knee in future with Dr. Wynelle Link  08/07/2011   Bladder cancer ([place]) 11/2005   CAD S/P percutaneous coronary angioplasty 08/07/2011   NEW 08/07/11 cutting balloon PTCA to 95% ostial diag. and 90%  proximal LCX.  prior cutting balloon and to RCA  & ostium of ostial diag; Last Cath 02/2012 - Patent Diag PTCA, RCA & Circ BMS stents. Normal EF & EDP; Myoview 2/14 no evidence of ischemia or infarction   Diabetes mellitus type 2, controlled, with complications ([place])    Diabetes mellitus without complication ([place])    Dyslipidemia    History of heart attack 11/2007   "mild; did not affect the heart muscle"; PCI to RCA, Cutting PTCA Diag ostium   Hypertension    Macular degeneration    Macular degeneration of right eye    Now getting shots.    Melanoma of skin ([place]) ~ 2005   "level 2; on my back"   Myocardial infarction [facility]) 2012   Osteoarthritis    Status post right knee arthroplasty,  positive for staph infection. Pending right arthroplasty   Portacath in place 08/11/2012   Statin intolerance 08/07/2011   Stroke [facility]) 1980's   "had facial strokes; 2; about a year apart; never did find what caused them"    Patient Active Problem List   Diagnosis Date Noted   Acute metabolic encephalopathy    AKI (acute kidney injury) ([place]) 05/12/2021   Chest pain 05/12/2021   COVID-19 virus infection 05/12/2021   Elevated d-dimer 05/12/2021   Psoriasis 04/23/2019   Colitis 04/06/2019   Diarrhea 04/06/2019   Abdominal pain 03/29/2019   Seasonal and perennial allergic rhinitis 01/06/2019   Sinus bradycardia 08/29/2017   Chronic stable angina ([place]) - negative Myoview & no new Dz on Cath 05/23/2015   Insomnia due to stress 07/04/2014    Class: Chronic   Venous stasis of both lower extremities 11/24/2013   Abnormal biliary HIDA scan 09/15/2013   Intestinal bacterial overgrowth 08/16/2013   Bloating 08/16/2013   Obesity (BMI 30.0-34.9) 01/30/2013   Stiffness of joint, not elsewhere classified, lower leg 10/26/2012   Weakness of left leg 10/26/2012   Difficulty in walking 10/26/2012   Postoperative anemia due to acute blood loss 09/29/2012   OA (osteoarthritis) of knee 09/28/2012   Portacath in place 08/11/2012   CAD  S/P percutaneous coronary angioplasty: 08/07/11 cutting balloon PTCA to 95% ostial diag. and 90%  proximal LCX.  prior cutting balloon to RCA  & ostium of ostial diag., 2009  08/07/2011   Essential hypertension 08/07/2011   DOE (dyspnea on exertion) 08/07/2011   Diabetes mellitus type 2, controlled, with complications ([place]) 70/17/7939   Hyperlipidemia associated with type 2 diabetes mellitus ([place]) 08/07/2011   Arthritis of knee, left, needs total Knee in future with Dr. Wynelle Link  08/07/2011   Statin myopathy 08/07/2011    Past Surgical History:  Procedure Laterality Date   BIOPSY  05/28/2019   Procedure: BIOPSY;  Surgeon: Rogene Houston, MD;  [facility];   Service: Endoscopy;;  cecum   CARDIAC CATHETERIZATION  August '13   Widely patent RCA and LCx stents. Also patent PTCA site to D1, ~40-50% D2. Normal EF, normal EDP   CATARACT EXTRACTION W/ INTRAOCULAR LENS IMPLANT  ~ 2010   right   CATARACT EXTRACTION W/PHACO Left 11/21/2016   Procedure: CATARACT EXTRACTION PHACO AND INTRAOCULAR LENS PLACEMENT (IOC) CDE - 11.70 ;  Surgeon: Tonny Branch, MD;  Location: [facility];  Service: Ophthalmology;  Laterality: Left;  left   cold cup removal bladder lesion  11/2005   "malignant"   COLONOSCOPY N/A 12/09/2014   Procedure: COLONOSCOPY;  Surgeon: Rogene Houston, MD;  [facility];  Service: Endoscopy;  Laterality: N/A;  955 - moved to 8:30 - Ann to notify pt   COLONOSCOPY WITH PROPOFOL N/A 05/28/2019   Procedure: COLONOSCOPY WITH PROPOFOL;  Surgeon: Rogene Houston, MD;  [facility];  Service: Endoscopy;  Laterality: N/A;   CORONARY ANGIOPLASTY WITH STENT PLACEMENT  11/2007   Mid RCA - Driver BMS 3.5 mm x 15 mm; 2.0 mm Cutting Balloon PTCA - ostial D1   CORONARY ANGIOPLASTY WITH STENT PLACEMENT  08/07/2011   Abnormal TM Myoview - for exertional throat discomfort: Patent RCA stent, 90% circumflex -- Vision BMS 3.5 mm x 12 mm --> 4.2 mm. Ostial D2 95% -- 2.25 mm Cutting Balloon PTCA   DOPPLER ECHOCARDIOGRAPHY  11/2007   Normal EF, impaired relaxation, with sclerotic aortic valve. No stenosis.   INGUINAL HERNIA REPAIR  ~ 1970   left   KNEE ARTHROSCOPY  02/20/2011   left   LEFT HEART CATHETERIZATION WITH CORONARY ANGIOGRAM N/A 08/07/2011   Procedure: LEFT HEART CATHETERIZATION WITH CORONARY ANGIOGRAM;  Surgeon: Leonie Man, MD;  Location: [facility] CATH LAB;  Service: Cardiovascular;  Laterality: N/A;   LEFT HEART CATHETERIZATION WITH CORONARY ANGIOGRAM N/A 03/06/2012   Procedure: LEFT HEART CATHETERIZATION WITH CORONARY ANGIOGRAM;  Surgeon: Leonie Man, MD;  Location: [facility] CATH LAB;  Service: Cardiovascular;  Laterality: N/A;   NM  MYOVIEW LTD  08/28/2012   Low risk, EF 49%, improved from prior. Small apical and apical septal defect, fixed likely artifact no skin or infarction.   PARTIAL KNEE ARTHROPLASTY  06/2003   right   PERCUTANEOUS CORONARY STENT INTERVENTION (PCI-S)  08/07/2011   Procedure: PERCUTANEOUS CORONARY STENT INTERVENTION (PCI-S);  Surgeon: Leonie Man, MD;  Location: [facility] CATH LAB;  Service: Cardiovascular;;   POLYPECTOMY  05/28/2019   Procedure: POLYPECTOMY;  Surgeon: Rogene Houston, MD;  [facility];  Service: Endoscopy;;  colon   TONSILLECTOMY     "as a child"   TOTAL KNEE ARTHROPLASTY  11/2003   right x2 and one on left- Total of 3.   TOTAL KNEE ARTHROPLASTY Left 09/28/2012   Procedure: TOTAL  KNEE ARTHROPLASTY;  Surgeon: Gearlean Alf, MD;  Location: [facility];  Service: Orthopedics;  Laterality: Left;   TRANSTHORACIC ECHOCARDIOGRAM  05/14/2021   EF 55 to 60%.  No R WMA.  GR 1 DD.   Mild LA dilation. Normal PAP and RAP.  Essentially normal.       Home Medications    Prior to Admission medications   Medication Sig Start Date End Date Taking? Authorizing Provider  acetaminophen (TYLENOL) 650 MG CR tablet Take 650 mg by mouth every 8 (eight) hours as needed for pain.    [provider]  Ascorbic Acid (VITAMIN C) 1000 MG tablet Take 1,000 mg by mouth 2 (two) times daily.    [provider]  azelastine (ASTELIN) 0.1 % nasal spray USE 2 SPRAYS IN EACH NOSTRIL TWICE DAILY 09/28/20   [name], FNP  cetirizine (ZYRTEC ALLERGY) 10 MG tablet Take 1 tablet (10 mg total) by mouth daily. 08/16/20   [name], FNP  Cholecalciferol (VITAMIN D) 125 MCG (5000 UT) CAPS Take 5,000 Units by mouth daily.    [provider]  clobetasol (TEMOVATE) 0.05 % external solution Apply 1 application topically daily as needed (irritation). 03/01/19   [provider]  Coenzyme Q10 (CO Q-10) 100 MG CAPS Take 300 capsules by mouth 3 (three) times daily. 08/29/17   Leonie Man, MD  diclofenac (VOLTAREN) 50 MG EC tablet diclofenac sodium 50 mg tablet,delayed release  TAKE 1 TABLET BY MOUTH TWICE DAILY 08/05/21   Hazel Sams, PA-C  EPINEPHrine 0.3 mg/0.3 mL IJ SOAJ injection Inject 0.3 mg into the muscle as needed for anaphylaxis. 12/25/18   [provider]  ezetimibe (ZETIA) 10 MG tablet Take 1 tablet (10 mg total) by mouth daily. 04/09/21 07/31/21  Leonie Man, MD  Fluticasone Propionate Truett Perna) 93 MCG/ACT [place] 2 application into the nose 2 (two) times daily. 09/08/19   Valentina Shaggy, MD  hydrALAZINE (APRESOLINE) 50 MG tablet Take 1 tablet (50 mg total) by mouth 3 (three) times daily. 07/31/21 10/29/21  Elgie Collard, PA-C  isosorbide mononitrate (IMDUR) 60 MG 24 hr tablet Take 1 tablet (60 mg total) by mouth daily. 05/17/21 08/15/21  Dahal, Marlowe Aschoff, MD  lisinopril (ZESTRIL) 40 MG tablet TAKE 1/2 TABLET BY MOUTH DAILY Patient taking differently: Take 40 mg by mouth daily. 04/30/21   Leonie Man, MD  loperamide (IMODIUM) 2 MG capsule Take 1 capsule (2 mg total) by mouth 4 (four) times daily as needed for diarrhea or loose stools. 03/30/19   Maudie Flakes, MD  LOTEMAX 0.5 % OINT Place 1 application into the left eye daily as needed (irritation, dry eyes). 02/15/21   [provider]  magnesium gluconate (MAGONATE) 500 MG tablet Take 500 mg by mouth daily.    [provider]  meloxicam (MOBIC) 15 MG tablet meloxicam 15 mg tablet  TAKE 1 TABLET BY MOUTH EVERY DAY WITH FOOD FOR INFLAMMATION    [provider]  Multiple Vitamins-Minerals (PRESERVISION AREDS PO) Take 1 capsule by mouth 2 (two) times daily.    [provider]  prednisoLONE acetate (PRED FORTE) 1 % ophthalmic suspension Place 1 drop into both eyes 2 (two) times daily as needed (irritation). 03/08/21   [provider]  predniSONE (DELTASONE) 10 MG tablet Take 4 tablets daily X 2 days, then, Take 3 tablets daily X 2 days, then, Take 2 tablets  daily X 2 days, then, Take 1 tablets daily X 1 day.  05/16/21   Terrilee Croak, MD  tacrolimus (PROTOPIC) 0.1 % ointment Apply topically 2 (two) times daily. Patient taking differently: Apply 1 application topically 2 (two) times daily as needed (irritation). 09/15/19   Valentina Shaggy, MD  tobramycin (TOBREX) 0.3 % ophthalmic solution Place 1 drop into both eyes daily as needed (before , during and after injection). 02/20/21   [provider]  triazolam (HALCION) 0.125 MG tablet Take 1.5 tablets (0.1875 mg total) by mouth at bedtime. Pt takes 1.5 tabs at bedtime Patient taking differently: 0.125 mg. Take 2 tablets at bedtime by mouth 08/29/17   Leonie Man, MD    Family History Family History  Problem Relation Age of Onset   Emphysema Father    Lung cancer Paternal Uncle        x 2 smokers   Colon cancer Neg Hx     Social History Social History   Tobacco Use   Smoking status: Former    Packs/day: 2.00    Years: 28.00    Pack years: 56.00    Types: Cigarettes    Quit date: 07/29/1980    Years since quitting: 41.0   Smokeless tobacco: Never  Vaping Use   Vaping Use: Never used  Substance Use Topics   Alcohol use: Yes    Alcohol/week: 2.0 standard drinks    Types: 1 Glasses of wine, 1 Standard drinks or equivalent per week    Comment:  "wine or whiskey" occasionally   Drug use: No     Allergies   Aspirin, Rosuvastatin calcium, and Other   Review of Systems Review of Systems  Musculoskeletal:        L elbow pain L hand pain L forearm pain  All other systems reviewed and are negative.   Physical Exam Triage Vital Signs ED Triage Vitals  Enc Vitals Group     BP 08/05/21 1511 (!) 173/100     Pulse Rate 08/05/21 1511 86     Resp 08/05/21 1511 18     Temp 08/05/21 1511 98.5 F (36.9 C)     Temp Source 08/05/21 1511 Oral     SpO2 08/05/21 1511 93 %     Weight --      Height --      Head Circumference --      Peak Flow --      Pain Score 08/05/21  1509 8     Pain Loc --      Pain Edu? --      Excl. in [place]? --    No data found.  Updated Vital Signs BP (!) 173/100 (BP Location: Right Arm)    Pulse 86    Temp 98.5 F (36.9 C) (Oral)    Resp 18    SpO2 93%   Visual Acuity Right Eye Distance:   Left Eye Distance:   Bilateral Distance:    Right Eye Near:   Left Eye Near:    Bilateral Near:     Physical Exam Vitals reviewed.  Constitutional:      General: He is not in acute distress.    Appearance: Normal appearance. He is well-groomed. He is not ill-appearing or diaphoretic.  HENT:     Head: Normocephalic and atraumatic.     Comments: No abrasion ecchymosis or laceration to head or scalp.    Nose: Nose normal.     Mouth/Throat:     Mouth: No injury or lacerations.     Pharynx: Oropharynx is clear.  Comments: No lip or oral mucosal laceration Mandible is without tenderness or deformity. No trismus or TMJ.  Eyes:     General: Vision grossly intact.     Extraocular Movements: Extraocular movements intact.     Pupils: Pupils are equal, round, and reactive to light.     Comments: No orbital tenderness EOMI, PERRLA  Neck:     Comments: See MSK Cardiovascular:     Rate and Rhythm: Normal rate and regular rhythm.     Heart sounds: Normal heart sounds.  Pulmonary:     Effort: Pulmonary effort is normal.     Breath sounds: Normal breath sounds.  Chest:     Chest wall: No tenderness.  Abdominal:     Palpations: Abdomen is soft.     Tenderness: There is no abdominal tenderness. There is no guarding or rebound.     Comments: No pain   Musculoskeletal:        General: No swelling, tenderness, deformity or signs of injury. Normal range of motion.     Cervical back: Normal. No swelling, edema, deformity, erythema, signs of trauma, lacerations, rigidity, spasms, torticollis, tenderness, bony tenderness or crepitus. No pain with movement. Normal range of motion.     Thoracic back: Normal. No swelling, edema, deformity, signs  of trauma, lacerations, spasms, tenderness or bony tenderness. Normal range of motion. No scoliosis.     Lumbar back: Normal. No swelling, edema, deformity, signs of trauma, lacerations, spasms, tenderness or bony tenderness. Normal range of motion. Negative right straight leg raise test and negative left straight leg raise test. No scoliosis.     Right lower leg: No edema.     Left lower leg: No edema.     Comments: L elbow: TTP lateral epicondyle and antecubital fossa. ROM flexion and extension intact but with pain.   L Hand and wrist is without point tenderness but some wrist pain elicited with flexion and extension. Sensation intact. NO snuffbox tenderness. Grip strength 5/5. Radial pulse 2+, cap refill <2 seconds. No R snuffbox tenderness.   No cervical, thoracic, lumbar paraspinous tenderness. No midline spinous tenderness deformity or stepoff. Strength and sensation grossly intact upper and lower extremities. No hip or pelvic instability. ROM flexion and extension intact of all major joints, without laxity tenderness or crepitus. No obvious bony deformity.   Skin:    Findings: Wound present. No signs of injury, laceration or lesion.     Comments: Posterior L arm with 1.5cm skin tear overlying distal humerus. No active bleeding. No surrounding erythema, tenderness.   Neurological:     General: No focal deficit present.     Mental Status: He is alert and oriented to person, place, and time.     Cranial Nerves: No cranial nerve deficit.     Sensory: Sensation is intact. No sensory deficit.     Motor: Motor function is intact. No weakness or pronator drift.     Coordination: Coordination is intact. Romberg sign negative. Finger-Nose-Finger Test normal.     Gait: Gait is intact. Gait normal.     Comments: CN 2-12 grossly intact, PERRLA, EOMI. Negative rhomberg, pronator drift, fingers to thumb.   Psychiatric:        Mood and Affect: Mood normal.        Behavior: Behavior normal.         Thought Content: Thought content normal.        Judgment: Judgment normal.     UC Treatments / Results  Labs (all  labs ordered are listed, but only abnormal results are displayed) Labs Reviewed - No data to display  EKG   Radiology DG Elbow Complete Left  Result Date: 08/05/2021 CLINICAL DATA:  Fall. EXAM: LEFT ELBOW - COMPLETE 3+ VIEW COMPARISON:  None. FINDINGS: No acute fracture or dislocation. No joint effusion. Severe osteoarthritis with bulky marginal osteophytes. Soft tissues are unremarkable. IMPRESSION: 1. No acute osseous abnormality. 2. Severe elbow osteoarthritis. Electronically Signed   By: Titus Dubin M.D.   On: 08/05/2021 15:54   DG Hand Complete Left  Result Date: 08/05/2021 CLINICAL DATA:  Fall. EXAM: LEFT HAND - COMPLETE 3+ VIEW COMPARISON:  None. FINDINGS: No acute fracture or dislocation. Scattered mild osteoarthritis. Soft tissues are unremarkable. IMPRESSION: No acute osseous abnormality. Electronically Signed   By: Titus Dubin M.D.   On: 08/05/2021 15:53    Procedures Procedures (including critical care time)  Medications Ordered in UC Medications - No data to display  Initial Impression / Assessment and Plan / UC Course  I have reviewed the triage vital signs and the nursing notes.  Pertinent labs & imaging results that were available during my care of the patient were reviewed by me and considered in my medical decision making (see chart for details).     This patient is a very pleasant 81 y.o. year old male presenting with L elbow contusion following fall down 1 stair that occurred 1 day ago. Does not take blood thinner. No head trauma/ LOC.  Xray L elbow: 1. No acute osseous abnormality. 2. Severe elbow osteoarthritis.  Xray L hand: No acute osseous abnormality.  Sling provided. Already has an appt with ortho 08/08/21, f/u with them. Voltaren PO sent at patient request, has tolerated this well multiple times in the past.   ED return  precautions discussed. Patient verbalizes understanding and agreement.     Final Clinical Impressions(s) / UC Diagnoses   Final diagnoses:  Contusion of left elbow, initial encounter  Arthritis of left elbow  Fall down stairs, initial encounter     Discharge Instructions      -Voltaren taken with food twice daily as needed -Elbow sling while pain persists -Follow-up with orthopedist as scheduled on 1/11 -Please check your blood pressure at home or at the pharmacy. If this continues to be >140/90, follow-up with your primary care provider for further blood pressure management/ medication titration. If you develop chest pain, shortness of breath, vision changes, the worst headache of your life- head straight to the ED or call 911.      ED Prescriptions     Medication Sig Dispense Auth. Provider   diclofenac (VOLTAREN) 50 MG EC tablet diclofenac sodium 50 mg tablet,delayed release  TAKE 1 TABLET BY MOUTH TWICE DAILY 20 tablet Hazel Sams, PA-C      PDMP not reviewed this encounter.   Hazel Sams, PA-C 08/05/21 [phone]

## 2021-08-08 DIAGNOSIS — M25562 Pain in left knee: Secondary | ICD-10-CM | POA: Diagnosis not present

## 2021-08-08 DIAGNOSIS — M25561 Pain in right knee: Secondary | ICD-10-CM | POA: Diagnosis not present

## 2021-08-08 DIAGNOSIS — H353221 Exudative age-related macular degeneration, left eye, with active choroidal neovascularization: Secondary | ICD-10-CM | POA: Diagnosis not present

## 2021-08-10 ENCOUNTER — Encounter: Payer: Self-pay | Admitting: Cardiology

## 2021-08-10 ENCOUNTER — Other Ambulatory Visit: Payer: Self-pay

## 2021-08-10 ENCOUNTER — Ambulatory Visit: Payer: Medicare Other | Admitting: Cardiology

## 2021-08-10 VITALS — BP 182/86 | HR 83 | Ht 72.0 in | Wt 249.0 lb

## 2021-08-10 DIAGNOSIS — I208 Other forms of angina pectoris: Secondary | ICD-10-CM

## 2021-08-10 DIAGNOSIS — I2510 Atherosclerotic heart disease of native coronary artery without angina pectoris: Secondary | ICD-10-CM

## 2021-08-10 DIAGNOSIS — I10 Essential (primary) hypertension: Secondary | ICD-10-CM | POA: Diagnosis not present

## 2021-08-10 DIAGNOSIS — Z9861 Coronary angioplasty status: Secondary | ICD-10-CM

## 2021-08-10 DIAGNOSIS — E1169 Type 2 diabetes mellitus with other specified complication: Secondary | ICD-10-CM

## 2021-08-10 DIAGNOSIS — G720 Drug-induced myopathy: Secondary | ICD-10-CM

## 2021-08-10 DIAGNOSIS — R0789 Other chest pain: Secondary | ICD-10-CM

## 2021-08-10 DIAGNOSIS — E785 Hyperlipidemia, unspecified: Secondary | ICD-10-CM

## 2021-08-10 DIAGNOSIS — R0609 Other forms of dyspnea: Secondary | ICD-10-CM

## 2021-08-10 DIAGNOSIS — T466X5A Adverse effect of antihyperlipidemic and antiarteriosclerotic drugs, initial encounter: Secondary | ICD-10-CM

## 2021-08-10 DIAGNOSIS — R001 Bradycardia, unspecified: Secondary | ICD-10-CM

## 2021-08-10 MED ORDER — SPIRONOLACTONE 25 MG PO TABS: 12.5000 mg | ORAL_TABLET | Freq: Every morning | ORAL | 3 refills | Status: DC

## 2021-08-10 MED ORDER — HYDRALAZINE HCL 100 MG PO TABS: 100.0000 mg | ORAL_TABLET | Freq: Three times a day (TID) | ORAL | 2 refills | Status: DC

## 2021-08-10 MED ORDER — SIMVASTATIN 20 MG PO TABS: 20.0000 mg | ORAL_TABLET | Freq: Every day | ORAL | 3 refills | Status: DC

## 2021-08-10 MED ORDER — ISOSORBIDE MONONITRATE ER 60 MG PO TB24: 60.0000 mg | ORAL_TABLET | Freq: Every day | ORAL | 2 refills | Status: DC

## 2021-08-10 NOTE — Assessment & Plan Note (Addendum)
His pressures look great when I saw him back in October.  Unfortunately, since his October episode of COVID, his pressures have been very poorly controlled.  Several evaluations and treatments and still not at goal.  His pressure log shows several readings in the 180s and 190s still with rare readings down into the 706C systolic.   His wife has been giving him double doses of hydralazine on occasion, and that has brought the pressures down more than the lower dose.  He is not taking spironolactone because of frequent urination, but he still urinating quite a bit now.  Plan: Continue lisinopril and high-dose, and will restart low-dose spironolactone to see if he can tolerate it.  Increase hydralazine to 100 mg 3 times daily  We have been avoiding amlodipine because of edema issues-trying to avoid diuretic, but we may need to go back to amlodipine.  We can restart his statin and increase the dose of hydralazine, then he will follow-up 1 month to reassess pressures, then we will probably have him follow-up after his lipids are checked with [facility] (clinical pharmacist run) [facility]  Continue pressure log

## 2021-08-10 NOTE — Patient Instructions (Addendum)
Medication Instructions:    Increase Isosorbide mononitrate ( Imdur) 60 mg  - whole tablet  from 1/2 tablet   Restart taking Simvastatin 20 mg at bedtime    Increase hydralazine 100 mg three times a day    Spironolactone  12.5 mg ( 1/2 tablet of 25 mg ) take daily  in the morning     *If you need a refill on your cardiac medications before your next appointment, please call your pharmacy*   Lab Work:  In March 2023 Lipid- fasting CMP If you have labs (blood work) drawn today and your tests are completely normal, you will receive your results only by: [place] (if you have MyChart) OR A paper copy in the mail If you have any lab test that is abnormal or we need to change your treatment, we will call you to review the results.   Testing/Procedures:  Will be schedule [place] has requested that you have a lexiscan myoview. Please follow instruction sheet, as given.   Follow-Up: At [facility], you and your health needs are our priority.  As part of our continuing mission to provide you with exceptional heart care, we have created designated Provider Care Teams.  These Care Teams include your primary Cardiologist (physician) and Advanced Practice Providers (APPs -  Physician Assistants and Nurse Practitioners) who all work together to provide you with the care you need, when you need it.     Your next appointment:   1 month(s)  The format for your next appointment:   In Person  Provider:   Glenetta Hew, MD    Other Instructions

## 2021-08-10 NOTE — Assessment & Plan Note (Signed)
He actually had complete imaging and, but his wife encouraged him to speak up about it.  He has been having intermittent episodes of chest discomfort including today while waiting for his visit.  This is the first time is commented on chest discomfort in a while.  When I saw him in October he was having some exertional burning sensation in his chest.  He states he is still having symptoms, I do think we need to evaluate.  Plan: Increase Imdur to full 60 mg tablet (currently actually only taking 1/2 tablet)  Improved blood pressure control, and low threshold to add amlodipine  Unable to use beta-blocker.  Lexiscan Myoview (however he would like to wait until February to get this done-he has had multiple doctors visits recently and is tired of going to offices.

## 2021-08-10 NOTE — Assessment & Plan Note (Signed)
No longer on beta-blocker or calcium channel blocker because of bradycardia.  Monitor did not show chronotropic incompetence indicated that he was able to increase his heart rate up into the 130s at rest.  He did have bradycardia, but not significant and usually during hours of sleep.

## 2021-08-10 NOTE — Assessment & Plan Note (Addendum)
We had had him do a statin holiday, did not really notice any change in his fatigue with a statin holiday.  As such, I will restart simvastatin.  He did not tolerate atorvastatin or rosuvastatin in the past, only tolerating moderate dose simvastatin.  The plan had been to check lipids soon, however I will delay this for couple months to allow him to restart his statin.  His labs from December clearly showed the LDL is not at goal, but did not have that much of an appreciable increase being off of statin.  I suspect that he probably will need additional therapy beyond simvastatin, but will be scheduled back on the statin.  Continue Zetia.  I suspect that we may need to consider PCSK9 inhibitor.   A1c was 6.9 back in October.  He does not appear to be on metformin anymore.  At present I do not see a diabetic medication currently listed.  Will defer monitoring to PCP.

## 2021-08-10 NOTE — Assessment & Plan Note (Addendum)
Still present, now he should be healed from his COVID.  His echocardiogram looked fine at that time last year, at present, I am concerned about progressive ischemia.  Plan: Lexiscan Myoview  Increase Imdur and hydralazine for better blood pressure and anginal control.

## 2021-08-10 NOTE — Assessment & Plan Note (Signed)
I was hoping that these results of chest discomfort he is feeling back in October was related to COVID, it still could be but we are evaluating with a Myoview stress test, in February.  Not on beta-blocker because of bradycardia and fatigue.  Plan:  Previously stopped amlodipine because of edema, he may need to restart at low-dose.  He is still not on aspirin, we will asked that he restart 81 mg daily.  Restart simvastatin and continue Zetia -> may need to be more aggressive  Increase Imdur dose to 60 mg (currently taking 30 mg)  Titrate up hydralazine for additional blood pressure control  Lexiscan Myoview

## 2021-08-10 NOTE — Assessment & Plan Note (Signed)
He actually did not tolerate atorvastatin and rosuvastatin but did okay with simvastatin at lower dose.  Plan:  Continue Zetia, and restart simvastatin 20 mg.  Recheck lipids in March April timeframe, low threshold to consider [facility] lipid clinic follow-up.

## 2021-08-10 NOTE — Progress Notes (Signed)
Primary Care Provider: Redmond School, MD Cardiologist: Glenetta Hew, MD Electrophysiologist: None  Clinic Note: Chief Complaint  Patient presents with   Follow-up    Still feels tired, weak and fatigued.  Now having chest discomfort   Coronary Artery Disease    Chronic stable angina now more complaints of chest discomfort   Hypertension    Still not adequately controlled.   Hyperlipidemia    Never restarted simvastatin   ===================================  ASSESSMENT/PLAN   Problem List Items Addressed This Visit       Cardiology Problems   CAD S/P percutaneous coronary angioplasty: 08/07/11 cutting balloon PTCA to 95% ostial diag. and 90%  proximal LCX.  prior cutting balloon to RCA  & ostium of ostial diag., 2009  (Chronic)    I was hoping that these results of chest discomfort he is feeling back in October was related to [name], it still could be but we are evaluating with a Myoview stress test, in February.  Not on beta-blocker because of bradycardia and fatigue.  Plan: Previously stopped amlodipine because of edema, he may need to restart at low-dose. He is still not on aspirin, we will asked that he restart 81 mg daily. Restart simvastatin and continue Zetia -> may need to be more aggressive Increase Imdur dose to 60 mg (currently taking 30 mg) Titrate up hydralazine for additional blood pressure control Lexiscan Myoview      Relevant Medications   isosorbide mononitrate (IMDUR) 60 MG 24 hr tablet   hydrALAZINE (APRESOLINE) 100 MG tablet   spironolactone (ALDACTONE) 25 MG tablet   simvastatin (ZOCOR) 20 MG tablet   Other Relevant Orders   Cardiac Stress Test: Informed Consent Details: Physician/Practitioner Attestation; Transcribe to consent form and obtain patient signature   MYOCARDIAL PERFUSION IMAGING   Lipid panel   Comprehensive metabolic panel   Essential hypertension (Chronic)    His pressures look great when I saw him back in October.   Unfortunately, since his October episode of COVID, his pressures have been very poorly controlled.  Several evaluations and treatments and still not at goal.  His pressure log shows several readings in the 180s and 190s still with rare readings down into the 671I systolic.   His wife has been giving him double doses of hydralazine on occasion, and that has brought the pressures down more than the lower dose.  He is not taking spironolactone because of frequent urination, but he still urinating quite a bit now.  Plan: Continue lisinopril and high-dose, and will restart low-dose spironolactone to see if he can tolerate it. Increase hydralazine to 100 mg 3 times daily We have been avoiding amlodipine because of edema issues-trying to avoid diuretic, but we may need to go back to amlodipine. We can restart his statin and increase the dose of hydralazine, then he will follow-up 1 month to reassess pressures, then we will probably have him follow-up after his lipids are checked with [facility] (clinical pharmacist run) Lipid-Hypertension Clinic Continue pressure log      Relevant Medications   isosorbide mononitrate (IMDUR) 60 MG 24 hr tablet   hydrALAZINE (APRESOLINE) 100 MG tablet   spironolactone (ALDACTONE) 25 MG tablet   simvastatin (ZOCOR) 20 MG tablet   Other Relevant Orders   Cardiac Stress Test: Informed Consent Details: Physician/Practitioner Attestation; Transcribe to consent form and obtain patient signature   Hyperlipidemia associated with type 2 diabetes mellitus ([name]) (Chronic)    We had had him do a statin holiday, did not really  notice any change in his fatigue with a statin holiday.  As such, I will restart simvastatin.  He did not tolerate atorvastatin or rosuvastatin in the past, only tolerating moderate dose simvastatin.  The plan had been to check lipids soon, however I will delay this for couple months to allow him to restart his statin.  His labs from December clearly showed the  LDL is not at goal, but did not have that much of an appreciable increase being off of statin.  I suspect that he probably will need additional therapy beyond simvastatin, but will be scheduled back on the statin.  Continue Zetia.  I suspect that we may need to consider PCSK9 inhibitor.   A1c was 6.9 back in October.  He does not appear to be on metformin anymore.  At present I do not see a diabetic medication currently listed.  Will defer monitoring to PCP.      Relevant Medications   isosorbide mononitrate (IMDUR) 60 MG 24 hr tablet   hydrALAZINE (APRESOLINE) 100 MG tablet   spironolactone (ALDACTONE) 25 MG tablet   simvastatin (ZOCOR) 20 MG tablet   Other Relevant Orders   Cardiac Stress Test: Informed Consent Details: Physician/Practitioner Attestation; Transcribe to consent form and obtain patient signature   Lipid panel   Comprehensive metabolic panel   Chronic stable angina (HCC) - negative Myoview & no new Dz on Cath - Primary (Chronic)    He actually had complete imaging and, but his wife encouraged him to speak up about it.  He has been having intermittent episodes of chest discomfort including today while waiting for his visit.  This is the first time is commented on chest discomfort in a while.  When I saw him in October he was having some exertional burning sensation in his chest.  He states he is still having symptoms, I do think we need to evaluate.  Plan: Increase Imdur to full 60 mg tablet (currently actually only taking 1/2 tablet) Improved blood pressure control, and low threshold to add amlodipine Unable to use beta-blocker. Lexiscan Myoview (however he would like to wait until February to get this done-he has had multiple doctors visits recently and is tired of going to offices.      Relevant Medications   isosorbide mononitrate (IMDUR) 60 MG 24 hr tablet   hydrALAZINE (APRESOLINE) 100 MG tablet   spironolactone (ALDACTONE) 25 MG tablet   simvastatin (ZOCOR) 20 MG  tablet   Sinus bradycardia (Chronic)    No longer on beta-blocker or calcium channel blocker because of bradycardia.  Monitor did not show chronotropic incompetence indicated that he was able to increase his heart rate up into the 130s at rest.  He did have bradycardia, but not significant and usually during hours of sleep.      Relevant Medications   isosorbide mononitrate (IMDUR) 60 MG 24 hr tablet   hydrALAZINE (APRESOLINE) 100 MG tablet   spironolactone (ALDACTONE) 25 MG tablet   simvastatin (ZOCOR) 20 MG tablet     Other   Statin myopathy (Chronic)    He actually did not tolerate atorvastatin and rosuvastatin but did okay with simvastatin at lower dose.  Plan: Continue Zetia, and restart simvastatin 20 mg. Recheck lipids in March April timeframe, low threshold to consider [facility] lipid clinic follow-up.      Relevant Orders   Cardiac Stress Test: Informed Consent Details: Physician/Practitioner Attestation; Transcribe to consent form and obtain patient signature   Lipid panel   Chest pain  Relevant Orders   MYOCARDIAL PERFUSION IMAGING   Lipid panel   Comprehensive metabolic panel   DOE (dyspnea on exertion) (Chronic)    Still present, now he should be healed from his COVID.  His echocardiogram looked fine at that time last year, at present, I am concerned about progressive ischemia.  Plan: Lexiscan Myoview Increase Imdur and hydralazine for better blood pressure and anginal control.      Other Visit Diagnoses     Sensation of chest tightness       Relevant Orders   Cardiac Stress Test: Informed Consent Details: Physician/Practitioner Attestation; Transcribe to consent form and obtain patient signature   MYOCARDIAL PERFUSION IMAGING   Lipid panel   Comprehensive metabolic panel       ===================================  HPI:    Arthur Lutz is a 81 y.o. male with a PMH notable for CAD (PTCA/12 of ostial D2 in 2009 and 2013, BMS PCI to RCA 2009, DES PCI of  LCx in 2013), HTN (now difficult control), HLD, DM-2 and Chronic Venous Stasis below who presents today for 1 week follow-up.  I last saw Mr. Gayden on May 21, 2021 shortly after his hospitalization for COVID.  He had a pretty significant hypoxia exacerbation requiring ICU transfer with fever of 104.7.  Was actually treated with antibiotics per concern for sepsis pneumonia.  He was bacteremic with Staph aureus. ->  Carvedilol slowly weaned off.  When I saw him, his blood pressures were pretty well controlled without the carvedilol and lower dose of lisinopril.   He was seen on July 06, 2019 by Dr. Percival Spanish for hypertensive urgency --Home BP checked 233/97 mmHg-went down after taking extra dose of lisinopril.  Was associated with some headache and mild chest discomfort..  This was related to having stopped his carvedilol because of bradycardia and also him stopping spironolactone due to nocturia.  Dr. Percival Spanish added hydralazine 10 mg 3 times daily and told him to take extra doses for higher blood pressures. -> Had a follow-up a couple weeks later with Nicholes Rough, PA.  He really noted a lot of constipation issues.  There was suggestion of possible partial small bowel obstruction.  At this time he denied to her any chest pain.  Blood pressures were better, running in the 170s.  There were some readings over 408 systolic. => Hydralazine increased to 25 TID.  Weight was trending down.  Only minimal end of day edema.  They plan to recheck his lipid panel with consideration of possible referral to [facility] lipid clinic.  ROXAS CLYMER was just seen on 07/31/2021 by Nicholes Rough, PA -- blood pressures were still elevated, unfortunately he did not increase his hydralazine.  He was only taking 25 mg TID because the pharmacy did not receive the new prescription.  Also discussed Zio patch monitor.  Relatively asymptomatic.  Scheduled to see lipid clinic on 1/31.  .   Lab Results  Component Value Date   CREATININE  0.87 08/02/2021   BUN 20 08/02/2021   NA 144 08/02/2021   K 4.3 08/02/2021   CL 104 08/02/2021   CO2 26 08/02/2021    Recent Hospitalizations: n/a  Reviewed  CV studies:    The following studies were reviewed today: (if available, images/films reviewed: From Epic Chart or Care Everywhere)  Zio Patch Monitor: 7-day (December 2022): Predominant rhythm-sinus w/ bundle branch block/IVCD noted.  HR range 35-136 bpm.  Average 67 bpm. HR 35 bpm @ 1AM. 19 atrial runs: Fastest and  longest interval was 27.5 seconds (60 beats) with an average heart rate of 132 bpm, maximum 54 bpm => some atrial runs were conducted with aberrancy & NOT not noted on diary.  Occasional isolated PACs with rare PAC couplets and PVCs.  No sustained tachycardic or bradycardic arrhythmia.  No suggestion of chronotropic incompetence.  TTE 05/14/2021:: EF 55 to 60%.  No R WMA.  GR 1 DD-mild LA dilation..  Normal RV size and function.  Normal PAP.  Normal MV.  AOV calcified, sclerosis no stenosis.  Normal RAP.  Interval History:   JOCOB DAMBACH returns here today for close follow-up still saying that his energy is just completely down since October.  He is still noticing some dyspnea but seems pretty stable he is now started has no evidence of chest tightness and pressure off and on.  He had some today in the clinic while waiting for his visit.  His wife indicates that he has been complaining of some chest pressure burning over the last couple weeks.  It is hard to tell because several clinic visits before there is been no comment about it. Not really noticing PND or orthopnea more than usual.  Mild trace edema.  No irregular heartbeats palpitations.  He did not notice any change in his fatigue since stopping/holding his statin.  His blood pressures are still been pretty high.  He brings a log with him showing pressure ranges from 154/72 with the lowest to 208/88.  Most of the systolic pressures are in the 160s to 237S with  diastolics in the 28B to 15V unless systolics are below 761.   CV Review of Symptoms (Summary) Cardiovascular ROS: positive for - chest pain, dyspnea on exertion, edema, orthopnea, and -orthopnea is really just how he sleeps for comfort; significant fatigue, exercise intolerance. negative for - irregular heartbeat, palpitations, paroxysmal nocturnal dyspnea, rapid heart rate, shortness of breath, or syncope/near syncope.  A/amaurosis fugax.  Claudication  REVIEWED OF SYSTEMS   ROS  for malaise/fatigue (Extremely worn out now.  Much more so than usual.  Prehospital, he still had those fatigue episodes with bradycardia.) and weight loss (Since being in the hospital.  About 10+ pounds).  HENT:  Negative for congestion and nosebleeds.   Respiratory:  Positive for shortness of breath.   Cardiovascular:  Positive for chest pain and leg swelling (Much better control.).  Gastrointestinal:  Positive for constipation (Off and on). Negative for abdominal pain, blood in stool and melena.  Genitourinary:  Positive for frequency (Urinates a lot with Lasix.).  Musculoskeletal:  Negative for falls, joint pain and myalgias.  Neurological:  Positive for dizziness (Lightheaded and dizzy.), weakness (Feels weak and lethargic since his hospital stay.-Not unexpected) and headaches. Negative for speech change and focal weakness.  Psychiatric/Behavioral:  Negative for depression (Sounds a little like dysthymia.) and memory loss. The patient is nervous/anxious. The patient   I have reviewed and (if needed) personally updated the patient's problem list, medications, allergies, past medical and surgical history, social and family history.   PAST MEDICAL HISTORY   Past Medical History:  Diagnosis Date   Allergic rhinitis    Aortic valve sclerosis    Cause of murmur   Arthritis of knee, left, needs total Knee in future with Dr. Wynelle Link  08/07/2011   Bladder cancer ([place]) 11/2005   CAD S/P percutaneous coronary  angioplasty 08/07/2011   NEW 08/07/11 cutting balloon PTCA to 95% ostial diag. and 90%  proximal LCX.  prior cutting balloon and to  RCA  & ostium of ostial diag; Last Cath 02/2012 - Patent Diag PTCA, RCA & Circ BMS stents. Normal EF & EDP; Myoview 2/14 no evidence of ischemia or infarction   Diabetes mellitus type 2, controlled, with complications ([place])    Diabetes mellitus without complication ([place])    Dyslipidemia    History of heart attack 11/2007   "mild; did not affect the heart muscle"; PCI to RCA, Cutting PTCA Diag ostium   Hypertension    Macular degeneration    Macular degeneration of right eye    Now getting shots.    Melanoma of skin ([place]) ~ 2005   "level 2; on my back"   Myocardial infarction [facility]) 2012   Osteoarthritis    Status post right knee arthroplasty, positive for staph infection. Pending right arthroplasty   Portacath in place 08/11/2012   Statin intolerance 08/07/2011   Stroke ([place]) 1980's   "had facial strokes; 2; about a year apart; never did find what caused them"    PAST SURGICAL HISTORY   Past Surgical History:  Procedure Laterality Date   7-day ZIO PATCH MONITOR  06/2021   Predominant rhythm-sinus w/ bundle branch block/IVCD noted.  HR range 35-136 bpm.  Average 67 bpm. HR 35 bpm @ 1AM. 19 atrial runs: Fastest and longest interval was 27.5 seconds (60 beats) with an average heart rate of 132 bpm, max 54 bpm =>occasionally w/ aberrancy & NOT not noted on diary.  Occasional isolated PACs.  No sustained tachy or bradycardic arrhythmia.  No chronotropic incompetence.   BIOPSY  05/28/2019   Procedure: BIOPSY;  Surgeon: Rogene Houston, MD;  Location: [facility];  Service: Endoscopy;;  cecum   CATARACT EXTRACTION W/ INTRAOCULAR LENS IMPLANT  ~ 2010   right   CATARACT EXTRACTION W/PHACO Left 11/21/2016   Procedure: CATARACT EXTRACTION PHACO AND INTRAOCULAR LENS PLACEMENT ([place]) CDE - 11.70 ;  Surgeon: Tonny Branch, MD;  Location: [facility] ORS;  Service: Ophthalmology;   Laterality: Left;  left   cold cup removal bladder lesion  11/2005   "malignant"   COLONOSCOPY N/A 12/09/2014   Procedure: COLONOSCOPY;  Surgeon: Rogene Houston, MD;  Location: [facility];  Service: Endoscopy;  Laterality: N/A;  955 - moved to 8:30 - Ann to notify pt   COLONOSCOPY WITH PROPOFOL N/A 05/28/2019   Procedure: COLONOSCOPY WITH PROPOFOL;  Surgeon: Rogene Houston, MD;  [facility]: [facility];  Service: Endoscopy;  Laterality: N/A;   CORONARY ANGIOPLASTY WITH STENT PLACEMENT  11/2007   Mid RCA - Driver BMS 3.5 mm x 15 mm; 2.0 mm Cutting Balloon PTCA - ostial D1   CORONARY ANGIOPLASTY WITH STENT PLACEMENT  08/07/2011   Abnormal TM Myoview - for exertional throat discomfort: Patent RCA stent, 90% circumflex -- Vision BMS 3.5 mm x 12 mm --> 4.2 mm. Ostial D2 95% -- 2.25 mm Cutting Balloon PTCA   DOPPLER ECHOCARDIOGRAPHY  11/2007   Normal EF, impaired relaxation, with sclerotic aortic valve. No stenosis.   INGUINAL HERNIA REPAIR  ~ 1970   left   KNEE ARTHROSCOPY  02/20/2011   left   LEFT HEART CATHETERIZATION WITH CORONARY ANGIOGRAM N/A 08/07/2011   Procedure: LEFT HEART CATHETERIZATION WITH CORONARY ANGIOGRAM;  Surgeon: Leonie Man, MD;  Location: [facility] CATH LAB;  Service: Cardiovascular;  Laterality: N/A;   LEFT HEART CATHETERIZATION WITH CORONARY ANGIOGRAM N/A 03/06/2012   Procedure: LEFT HEART CATHETERIZATION WITH CORONARY ANGIOGRAM;  Surgeon: Leonie Man, MD;  Location: [facility] CATH LAB;  Service: Cardiovascular;;Widely patent RCA and LCx stents. Also patent PTCA site to D1, ~40-50% D2. Normal EF, normal EDP   NM MYOVIEW LTD  08/28/2012   Low risk, EF 49%, improved from prior. Small apical and apical septal defect, fixed likely artifact no skin or infarction.   PARTIAL KNEE ARTHROPLASTY  06/2003   right   PERCUTANEOUS CORONARY STENT INTERVENTION (PCI-S)  08/07/2011   Procedure: PERCUTANEOUS CORONARY STENT INTERVENTION (PCI-S);  Surgeon: Leonie Man, MD;  Location: [facility]  CATH LAB;  Service: Cardiovascular;;   POLYPECTOMY  05/28/2019   Procedure: POLYPECTOMY;  Surgeon: Rogene Houston, MD;  [facility];  Service: Endoscopy;;  colon   TONSILLECTOMY     "as a child"   TOTAL KNEE ARTHROPLASTY  11/2003   right x2 and one on left- Total of 3.   TOTAL KNEE ARTHROPLASTY Left 09/28/2012   Procedure: TOTAL KNEE ARTHROPLASTY;  Surgeon: Gearlean Alf, MD;  Location: [facility] ORS;  Service: Orthopedics;  Laterality: Left;   TRANSTHORACIC ECHOCARDIOGRAM  05/14/2021   EF 55 to 60%.  No R WMA.  GR 1 DD.   Mild LA dilation. Normal PAP and RAP.  Essentially normal.    Immunization History  Administered Date(s) Administered   Influenza-Unspecified 05/29/2018   Tdap 02/01/2017    MEDICATIONS/ALLERGIES   Current Meds  Medication Sig   acetaminophen (TYLENOL) 650 MG CR tablet Take 650 mg by mouth every 8 (eight) hours as needed for pain.   azelastine (ASTELIN) 0.1 % nasal spray USE 2 SPRAYS IN EACH NOSTRIL TWICE DAILY   Cholecalciferol (VITAMIN D) 125 MCG (5000 UT) CAPS Take 5,000 Units by mouth daily.   clobetasol (TEMOVATE) 0.05 % external solution Apply 1 application topically daily as needed (irritation).   Coenzyme Q10 (CO Q-10) 100 MG CAPS Take 300 capsules by mouth 3 (three) times daily.   diclofenac (VOLTAREN) 50 MG EC tablet diclofenac sodium 50 mg tablet,delayed release  TAKE 1 TABLET BY MOUTH TWICE DAILY   EPINEPHrine 0.3 mg/0.3 mL IJ SOAJ injection Inject 0.3 mg into the muscle as needed for anaphylaxis.   ezetimibe (ZETIA) 10 MG tablet Take 1 tablet (10 mg total) by mouth daily.   Fluticasone Propionate (XHANCE) 93 MCG/ACT EXHU Place 2 application into the nose 2 (two) times daily.   hydrALAZINE (APRESOLINE) 100 MG tablet Take 1 tablet (100 mg total) by mouth 3 (three) times daily.   loperamide (IMODIUM) 2 MG capsule Take 1 capsule (2 mg total) by mouth 4 (four) times daily as needed for diarrhea or loose stools.   LOTEMAX 0.5 % OINT Place 1  application into the left eye daily as needed (irritation, dry eyes).   magnesium gluconate (MAGONATE) 500 MG tablet Take 500 mg by mouth daily.   meloxicam (MOBIC) 15 MG tablet meloxicam 15 mg tablet  TAKE 1 TABLET BY MOUTH EVERY DAY WITH FOOD FOR INFLAMMATION   Multiple Vitamins-Minerals (PRESERVISION AREDS PO) Take 1 capsule by mouth 2 (two) times daily.   prednisoLONE acetate (PRED FORTE) 1 % ophthalmic suspension Place 1 drop into both eyes 2 (two) times daily as needed (irritation).   predniSONE (DELTASONE) 10 MG tablet Take 4 tablets daily X 2 days, then, Take 3 tablets daily X 2 days, then, Take 2 tablets daily X 2 days, then, Take 1 tablets daily X 1 day.   simvastatin (ZOCOR) 20 MG tablet Take 1 tablet (20 mg total) by mouth at bedtime.   spironolactone (ALDACTONE) 25 MG tablet Take 0.5 tablets (  12.5 mg total) by mouth every morning.   tacrolimus (PROTOPIC) 0.1 % ointment Apply topically 2 (two) times daily.   tobramycin (TOBREX) 0.3 % ophthalmic solution Place 1 drop into both eyes daily as needed (before , during and after injection).   triazolam (HALCION) 0.125 MG tablet Take 1.5 tablets (0.1875 mg total) by mouth at bedtime. Pt takes 1.5 tabs at bedtime (Patient taking differently: Take 0.1875 mg by mouth at bedtime. Pt takes 1.5 tabs at bedtime)   [DISCONTINUED] Ascorbic Acid (VITAMIN C) 1000 MG tablet Take 1,000 mg by mouth 2 (two) times daily.   [DISCONTINUED] hydrALAZINE (APRESOLINE) 50 MG tablet Take 1 tablet (50 mg total) by mouth 3 (three) times daily.   [DISCONTINUED] isosorbide mononitrate (IMDUR) 60 MG 24 hr tablet Take 1 tablet (60 mg total) by mouth daily.   [DISCONTINUED] lisinopril (ZESTRIL) 40 MG tablet Take 40 mg by mouth daily.    Allergies  Allergen Reactions   Aspirin Other (See Comments)    Was told to not take this due to having macular degeneration   Rosuvastatin Calcium Other (See Comments)    Aches and cramps   Other Rash and Other (See Comments)    Red  meat = develops a rash on the back    SOCIAL HISTORY/FAMILY HISTORY   Reviewed in Epic:  Pertinent findings:  Social History   Tobacco Use   Smoking status: Former    Packs/day: 2.00    Years: 28.00    Pack years: 56.00    Types: Cigarettes    Quit date: 07/29/1980    Years since quitting: 41.0   Smokeless tobacco: Never  Vaping Use   Vaping Use: Never used  Substance Use Topics   Alcohol use: Yes    Alcohol/week: 2.0 standard drinks    Types: 1 Glasses of wine, 1 Standard drinks or equivalent per week    Comment:  "wine or whiskey" occasionally   Drug use: No   Social History   Social History Narrative   He is a married father of 2, grandfather of 68. He is not really getting a lot of exercise now. He previously had been working out on a treadmill at least 2 times a day for 15 minutes at a time, but he   is not able to do that as much now because his knee hurts him too bad. Does not smoke and only occasional alcoholic beverage.    OBJCTIVE -PE, EKG, labs   Wt Readings from Last 3 Encounters:  08/10/21 249 lb (112.9 kg)  07/31/21 246 lb 9.6 oz (111.9 kg)  07/12/21 245 lb 6.4 oz (111.3 kg)    Physical Exam: BP (!) 182/86    Pulse 83    Ht 6' (1.829 m)    Wt 249 lb (112.9 kg)    SpO2 94%    BMI 33.77 kg/m  Physical Exam Vitals reviewed.  Constitutional:      General: He is not in acute distress (Just seems to be).    Appearance: Normal appearance. He is obese.     Comments: Seems worn out.  Well-groomed.  HENT:     Head: Normocephalic and atraumatic.  Neck:     Vascular: No carotid bruit or JVD.  Cardiovascular:     Rate and Rhythm: Normal rate and regular rhythm. Occasional Extrasystoles are present.    Pulses: Decreased pulses (Diminished, but palpable.  Feet are warm.).     Heart sounds: Heart sounds are distant. No murmur heard.  No friction rub. No gallop.     Comments: Normal S1, split S2. Pulmonary:     Effort: Pulmonary effort is normal. No  respiratory distress.     Breath sounds: Normal breath sounds. No wheezing, rhonchi or rales.  Chest:     Chest wall: No tenderness.  Musculoskeletal:        General: Swelling (Trace bilateral ankle) present.     Cervical back: Normal range of motion and neck supple.  Skin:    General: Skin is warm and dry.     Findings: Bruising (Mild) present.  Neurological:     General: No focal deficit present.     Mental Status: He is alert and oriented to person, place, and time.     Gait: Gait abnormal (Somewhat slow gait).  Psychiatric:        Behavior: Behavior normal.        Thought Content: Thought content normal.        Judgment: Judgment normal.     Comments: Seems somewhat down.     Adult ECG Report Not checked  Recent Labs: Reviewed Lab Results  Component Value Date   CHOL 158 07/17/2021   HDL 39 (L) 07/17/2021   LDLCALC 93 07/17/2021   TRIG 146 07/17/2021   CHOLHDL 4.1 07/17/2021   Lab Results  Component Value Date   CREATININE 0.87 08/02/2021   BUN 20 08/02/2021   NA 144 08/02/2021   K 4.3 08/02/2021   CL 104 08/02/2021   CO2 26 08/02/2021   CBC Latest Ref Rng & Units 05/16/2021 05/15/2021 05/14/2021  WBC 4.0 - 10.5 K/uL 7.7 9.6 12.7(H)  Hemoglobin 13.0 - 17.0 g/dL 12.4(L) 12.2(L) 12.5(L)  Hematocrit 39.0 - 52.0 % 38.6(L) 36.1(L) 38.2(L)  Platelets 150 - 400 K/uL 149(L) 123(L) 135(L)    Lab Results  Component Value Date   HGBA1C 6.9 (H) 05/13/2021   Lab Results  Component Value Date   TSH 1.760 08/21/2020    ==================================================  COVID-19 Education: The signs and symptoms of COVID-19 were discussed with the patient and how to seek care for testing (follow up with PCP or arrange E-visit).    I spent a total of 43 minutes with the patient spent in direct patient consultation.  Additional time spent with chart review  / charting (studies, outside notes, etc): 33 min Total Time: 76 min  Current medicines are reviewed at  length with the patient today.  (+/- concerns) none  This visit occurred during the SARS-CoV-2 public health emergency.  Safety protocols were in place, including screening questions prior to the visit, additional usage of staff PPE, and extensive cleaning of exam room while observing appropriate contact time as indicated for disinfecting solutions.  Notice: This dictation was prepared with Dragon dictation along with smart phrase technology. Any transcriptional errors that result from this process are unintentional and may not be corrected upon review.  Studies Ordered:   Orders Placed This Encounter  Procedures   Lipid panel   Comprehensive metabolic panel   Cardiac Stress Test: Informed Consent Details: Physician/Practitioner Attestation; Transcribe to consent form and obtain patient signature   MYOCARDIAL PERFUSION IMAGING   Shared Decision Making/Informed Consent The risks [chest pain, shortness of breath, cardiac arrhythmias, dizziness, blood pressure fluctuations, myocardial infarction, stroke/transient ischemic attack, nausea, vomiting, allergic reaction, radiation exposure, metallic taste sensation and life-threatening complications (estimated to be 1 in 10,000)], benefits (risk stratification, diagnosing coronary artery disease, treatment guidance) and alternatives of a nuclear stress test were discussed  in detail with Mr. Schleicher and he agrees to proceed.   Patient Instructions / Medication Changes & Studies & Tests Ordered   Patient Instructions  Medication Instructions:    Increase Isosorbide mononitrate ( Imdur) 60 mg  - whole tablet  from 1/2 tablet   Restart taking Simvastatin 20 mg at bedtime    Increase hydralazine 100 mg three times a day    Spironolactone  12.5 mg ( 1/2 tablet of 25 mg ) take daily  in the morning     *If you need a refill on your cardiac medications before your next appointment, please call your pharmacy*   Lab Work:  In March 2023 Lipid-  fasting CMP If you have labs (blood work) drawn today and your tests are completely normal, you will receive your results only by: [place] (if you have MyChart) OR A paper copy in the mail If you have any lab test that is abnormal or we need to change your treatment, we will call you to review the results.   Testing/Procedures:  Will be schedule Teachey has requested that you have a lexiscan myoview. Please follow instruction sheet, as given.   Follow-Up: At [facility], you and your health needs are our priority.  As part of our continuing mission to provide you with exceptional heart care, we have created designated Provider Care Teams.  These Care Teams include your primary Cardiologist (physician) and Advanced Practice Providers (APPs -  Physician Assistants and Nurse Practitioners) who all work together to provide you with the care you need, when you need it.     Your next appointment:   1 month(s)  The format for your next appointment:   In Person  Provider:   Glenetta Hew, MD    Other Instructions      Glenetta Hew, M.D., M.S. Interventional Cardiologist   Pager # [phone] Phone # [phone] [address] [place], [place] [address]   Thank you for choosing Heartcare at [facility]!!

## 2021-08-15 ENCOUNTER — Telehealth (HOSPITAL_COMMUNITY): Payer: Self-pay | Admitting: *Deleted

## 2021-08-15 NOTE — Telephone Encounter (Signed)
Patient given detailed instructions per Myocardial Perfusion Study Information Sheet for the test on 07/02/22 Patient notified to arrive 15 minutes early and that it is imperative to arrive on time for appointment to keep from having the test rescheduled.  If you need to cancel or reschedule your appointment, please call the office within 24 hours of your appointment. . Patient verbalized understanding.Arthur Lutz, Arthur Lutz   

## 2021-08-20 ENCOUNTER — Encounter (HOSPITAL_COMMUNITY): Payer: Medicare Other

## 2021-08-20 ENCOUNTER — Telehealth (HOSPITAL_COMMUNITY): Payer: Self-pay

## 2021-08-20 DIAGNOSIS — E1169 Type 2 diabetes mellitus with other specified complication: Secondary | ICD-10-CM | POA: Diagnosis not present

## 2021-08-20 DIAGNOSIS — I2510 Atherosclerotic heart disease of native coronary artery without angina pectoris: Secondary | ICD-10-CM | POA: Diagnosis not present

## 2021-08-20 DIAGNOSIS — E785 Hyperlipidemia, unspecified: Secondary | ICD-10-CM | POA: Diagnosis not present

## 2021-08-20 DIAGNOSIS — Z9861 Coronary angioplasty status: Secondary | ICD-10-CM | POA: Diagnosis not present

## 2021-08-20 NOTE — Telephone Encounter (Signed)
Spoke with the patient, detailed instructions were given. He stated that he would be here for his test. Asked to call back with any questions. S.Janila Arrazola EMTP 

## 2021-08-21 LAB — COMPREHENSIVE METABOLIC PANEL
ALT: 30 IU/L (ref 0–44)
AST: 20 IU/L (ref 0–40)
Albumin/Globulin Ratio: 2.4 — ABNORMAL HIGH (ref 1.2–2.2)
Albumin: 4.5 g/dL (ref 3.7–4.7)
Alkaline Phosphatase: 108 IU/L (ref 44–121)
BUN/Creatinine Ratio: 18 (ref 10–24)
BUN: 17 mg/dL (ref 8–27)
Bilirubin Total: 0.6 mg/dL (ref 0.0–1.2)
CO2: 26 mmol/L (ref 20–29)
Calcium: 9.6 mg/dL (ref 8.6–10.2)
Chloride: 105 mmol/L (ref 96–106)
Creatinine, Ser: 0.93 mg/dL (ref 0.76–1.27)
Globulin, Total: 1.9 g/dL (ref 1.5–4.5)
Glucose: 115 mg/dL — ABNORMAL HIGH (ref 70–99)
Potassium: 4.1 mmol/L (ref 3.5–5.2)
Sodium: 143 mmol/L (ref 134–144)
Total Protein: 6.4 g/dL (ref 6.0–8.5)
eGFR: 84 mL/min/{1.73_m2} (ref >59)

## 2021-08-21 LAB — LIPID PANEL
Chol/HDL Ratio: 3.1 ratio (ref 0.0–5.0)
Cholesterol, Total: 112 mg/dL (ref 100–199)
HDL: 36 mg/dL — ABNORMAL LOW (ref >39)
LDL Chol Calc (NIH): 56 mg/dL (ref 0–99)
Triglycerides: 105 mg/dL (ref 0–149)
VLDL Cholesterol Cal: 20 mg/dL (ref 5–40)

## 2021-08-22 ENCOUNTER — Encounter (HOSPITAL_COMMUNITY): Payer: Medicare Other

## 2021-08-23 ENCOUNTER — Ambulatory Visit (HOSPITAL_COMMUNITY): Payer: Medicare Other | Attending: Cardiology

## 2021-08-24 DIAGNOSIS — Z96653 Presence of artificial knee joint, bilateral: Secondary | ICD-10-CM | POA: Diagnosis not present

## 2021-08-28 ENCOUNTER — Ambulatory Visit (HOSPITAL_BASED_OUTPATIENT_CLINIC_OR_DEPARTMENT_OTHER): Payer: Medicare Other

## 2021-08-29 ENCOUNTER — Ambulatory Visit (HOSPITAL_COMMUNITY): Payer: Medicare Other | Attending: Cardiovascular Disease

## 2021-08-29 ENCOUNTER — Other Ambulatory Visit: Payer: Self-pay

## 2021-08-29 DIAGNOSIS — R0789 Other chest pain: Secondary | ICD-10-CM | POA: Diagnosis not present

## 2021-08-29 DIAGNOSIS — I2510 Atherosclerotic heart disease of native coronary artery without angina pectoris: Secondary | ICD-10-CM | POA: Insufficient documentation

## 2021-08-29 DIAGNOSIS — Z9861 Coronary angioplasty status: Secondary | ICD-10-CM | POA: Diagnosis not present

## 2021-08-29 HISTORY — PX: NM MYOVIEW LTD: HXRAD82

## 2021-08-29 LAB — MYOCARDIAL PERFUSION IMAGING
LV dias vol: 133 mL (ref 62–150)
LV sys vol: 52 mL
Nuc Stress EF: 61 %
Peak HR: 83 {beats}/min
Rest HR: 62 {beats}/min
Rest Nuclear Isotope Dose: 10.2 mCi
SDS: 1
SRS: 0
SSS: 1
ST Depression (mm): 0 mm
Stress Nuclear Isotope Dose: 32.1 mCi
TID: 1

## 2021-08-29 MED ORDER — REGADENOSON 0.4 MG/5ML IV SOLN
0.4000 mg | Freq: Once | INTRAVENOUS | Status: AC
  Administered 2021-08-29: 0.4 mg via INTRAVENOUS

## 2021-08-29 MED ORDER — TECHNETIUM TC 99M TETROFOSMIN IV KIT
32.1000 | PACK | Freq: Once | INTRAVENOUS | Status: AC | PRN
  Administered 2021-08-29: 32.1 via INTRAVENOUS
  Filled 2021-08-29: qty 33

## 2021-08-29 MED ORDER — TECHNETIUM TC 99M TETROFOSMIN IV KIT
10.2000 | PACK | Freq: Once | INTRAVENOUS | Status: AC | PRN
  Administered 2021-08-29: 10.2 via INTRAVENOUS
  Filled 2021-08-29: qty 11

## 2021-08-30 DIAGNOSIS — E1142 Type 2 diabetes mellitus with diabetic polyneuropathy: Secondary | ICD-10-CM | POA: Diagnosis not present

## 2021-08-30 DIAGNOSIS — B351 Tinea unguium: Secondary | ICD-10-CM | POA: Diagnosis not present

## 2021-08-31 DIAGNOSIS — M25562 Pain in left knee: Secondary | ICD-10-CM | POA: Diagnosis not present

## 2021-08-31 DIAGNOSIS — Z96653 Presence of artificial knee joint, bilateral: Secondary | ICD-10-CM | POA: Diagnosis not present

## 2021-08-31 DIAGNOSIS — M25561 Pain in right knee: Secondary | ICD-10-CM | POA: Diagnosis not present

## 2021-08-31 DIAGNOSIS — M25661 Stiffness of right knee, not elsewhere classified: Secondary | ICD-10-CM | POA: Diagnosis not present

## 2021-09-04 DIAGNOSIS — M25561 Pain in right knee: Secondary | ICD-10-CM | POA: Diagnosis not present

## 2021-09-04 DIAGNOSIS — Z96653 Presence of artificial knee joint, bilateral: Secondary | ICD-10-CM | POA: Diagnosis not present

## 2021-09-04 DIAGNOSIS — M25562 Pain in left knee: Secondary | ICD-10-CM | POA: Diagnosis not present

## 2021-09-04 DIAGNOSIS — M25661 Stiffness of right knee, not elsewhere classified: Secondary | ICD-10-CM | POA: Diagnosis not present

## 2021-09-11 DIAGNOSIS — M25562 Pain in left knee: Secondary | ICD-10-CM | POA: Diagnosis not present

## 2021-09-11 DIAGNOSIS — M25561 Pain in right knee: Secondary | ICD-10-CM | POA: Diagnosis not present

## 2021-09-11 DIAGNOSIS — Z96653 Presence of artificial knee joint, bilateral: Secondary | ICD-10-CM | POA: Diagnosis not present

## 2021-09-11 DIAGNOSIS — M25661 Stiffness of right knee, not elsewhere classified: Secondary | ICD-10-CM | POA: Diagnosis not present

## 2021-09-12 DIAGNOSIS — H353221 Exudative age-related macular degeneration, left eye, with active choroidal neovascularization: Secondary | ICD-10-CM | POA: Diagnosis not present

## 2021-09-13 ENCOUNTER — Encounter (HOSPITAL_COMMUNITY): Payer: Medicare Other

## 2021-09-14 ENCOUNTER — Ambulatory Visit: Payer: Medicare Other | Admitting: Cardiology

## 2021-09-14 ENCOUNTER — Other Ambulatory Visit: Payer: Self-pay

## 2021-09-14 ENCOUNTER — Encounter: Payer: Self-pay | Admitting: Cardiology

## 2021-09-14 VITALS — BP 144/78 | HR 60 | Resp 16 | Ht 72.0 in | Wt 246.0 lb

## 2021-09-14 DIAGNOSIS — I878 Other specified disorders of veins: Secondary | ICD-10-CM | POA: Diagnosis not present

## 2021-09-14 DIAGNOSIS — Z9861 Coronary angioplasty status: Secondary | ICD-10-CM

## 2021-09-14 DIAGNOSIS — I87313 Chronic venous hypertension (idiopathic) with ulcer of bilateral lower extremity: Secondary | ICD-10-CM

## 2021-09-14 DIAGNOSIS — R0609 Other forms of dyspnea: Secondary | ICD-10-CM

## 2021-09-14 DIAGNOSIS — I2510 Atherosclerotic heart disease of native coronary artery without angina pectoris: Secondary | ICD-10-CM

## 2021-09-14 DIAGNOSIS — G720 Drug-induced myopathy: Secondary | ICD-10-CM

## 2021-09-14 DIAGNOSIS — R001 Bradycardia, unspecified: Secondary | ICD-10-CM | POA: Diagnosis not present

## 2021-09-14 DIAGNOSIS — T466X5A Adverse effect of antihyperlipidemic and antiarteriosclerotic drugs, initial encounter: Secondary | ICD-10-CM

## 2021-09-14 DIAGNOSIS — I10 Essential (primary) hypertension: Secondary | ICD-10-CM

## 2021-09-14 DIAGNOSIS — E785 Hyperlipidemia, unspecified: Secondary | ICD-10-CM

## 2021-09-14 DIAGNOSIS — I25118 Atherosclerotic heart disease of native coronary artery with other forms of angina pectoris: Secondary | ICD-10-CM

## 2021-09-14 DIAGNOSIS — E1169 Type 2 diabetes mellitus with other specified complication: Secondary | ICD-10-CM

## 2021-09-14 DIAGNOSIS — I208 Other forms of angina pectoris: Secondary | ICD-10-CM | POA: Diagnosis not present

## 2021-09-14 DIAGNOSIS — I2089 Other forms of angina pectoris: Secondary | ICD-10-CM

## 2021-09-14 NOTE — Assessment & Plan Note (Addendum)
Patient's blood pressures are improved today from previous evaluation.  Systolic blood pressure 196.  His wife reports he has been taking the hydralazine 50 mg 3 times daily, amlodipine, lisinopril but she is unsure on the dosing of the latter 2.  He is also started taking the spironolactone regularly even though it makes him urinate frequently.  He is taking 12.5 mg of that.  Patient was unable to tolerate the increased dose of hydralazine. - Continue hydralazine 50 mg 3 times daily - Patient is going to call with the dosing for the amlodipine as well as the lisinopril so that we have these on record but we are going to continue his current amlodipine and lisinopril dosing. - Continue statin - Continue BP monitoring -> allowing for mild permissive hypertension: we basically talked about target range of blood pressures for him being acceptable from 110 to 222 mmHg systolic.  For pressures greater than 160, he should take an extra dose of amlodipine.  Fearful of hypotension related orthostatic symptoms.  He feels more energetic with blood pressures that are more in the 130 to 140 mmHg range.

## 2021-09-14 NOTE — Patient Instructions (Addendum)
Medication Instructions:   Call  back the office  with   Amlodipine  and Lisinopril    Continue all medicaitons *If you need a refill on your cardiac medications before your next appointment, please call your pharmacy*   Lab Work: Not needed    Testing/Procedures:  Not needed  Follow-Up: At [facility], you and your health needs are our priority.  As part of our continuing mission to provide you with exceptional heart care, we have created designated Provider Care Teams.  These Care Teams include your primary Cardiologist (physician) and Advanced Practice Providers (APPs -  Physician Assistants and Nurse Practitioners) who all work together to provide you with the care you need, when you need it.     Your next appointment:   6 month(s)  The format for your next appointment:   In Person  Provider:   Caron Presume, PA-C, Jory Sims, DNP, ANP, or Vara Guardian PA    Then, Glenetta Hew, MD will plan to see you again in 12 month(s).   Other instruction  ( Patient 's wife called back with information about  current medication in question  Amlodipine, Lisinopril, Isosorbide,hydralazine)

## 2021-09-14 NOTE — Progress Notes (Signed)
Primary Care Provider: Redmond School, MD Cardiologist: Glenetta Hew, MD Electrophysiologist: None  Clinic Note: Chief Complaint  Patient presents with   Follow-up    To discuss Myoview test results.  Overall feeling much better.    Coronary Artery Disease    Not really having any more chest pain   Hypertension    Blood pressure is better, but still not down into the "target range"    ===================================  ASSESSMENT/PLAN   Problem List Items Addressed This Visit       Cardiology Problems   CAD S/P percutaneous coronary angioplasty: 08/07/11 cutting balloon PTCA to 95% ostial diag. and 90%  proximal LCX.  prior cutting balloon to RCA  & ostium of ostial diag., 2009  - Primary (Chronic)    Patient's symptoms are continuing to improve.  Myoview stress test within normal limits.  Can still not initiate beta-blocker due to bradycardia and fatigue - Continue amlodipine - Aspirin 81 mg daily - Continue Zetia and simvastatin - Continue Imdur 60 mg  I suspect of the chest pressure he was having was probably still all post COVID-related.  Feeling much better now.  Active without any more chest pain.  Happy to hear about Myoview results. Clearly on beta-blocker because of bradycardia.   For antianginal benefit he is on Imdur plus amlodipine Remains on stable ACE inhibitor dose for afterload reduction Lipids well controlled on Zetia and simvastatin Need to restart aspirin 81 mg       Relevant Medications   lisinopril (ZESTRIL) 40 MG tablet   amLODipine (NORVASC) 10 MG tablet   hydrALAZINE (APRESOLINE) 100 MG tablet   Chronic stable angina (HCC) - negative Myoview & no new Dz on Cath (Chronic)    Patient completed Myoview after last visit.  Myoview within normal limits showing normal heart function. - Continue Imdur 60 mg daily along with amlodipine 10 mg - Continue to monitor for symptoms      Relevant Medications   lisinopril (ZESTRIL) 40 MG tablet    amLODipine (NORVASC) 10 MG tablet   hydrALAZINE (APRESOLINE) 100 MG tablet   Essential hypertension (Chronic)    Patient's blood pressures are improved today from previous evaluation.  Systolic blood pressure 629.  His wife reports he has been taking the hydralazine 50 mg 3 times daily, amlodipine, lisinopril but she is unsure on the dosing of the latter 2.  He is also started taking the spironolactone regularly even though it makes him urinate frequently.  He is taking 12.5 mg of that.  Patient was unable to tolerate the increased dose of hydralazine. - Continue hydralazine 50 mg 3 times daily - Patient is going to call with the dosing for the amlodipine as well as the lisinopril so that we have these on record but we are going to continue his current amlodipine and lisinopril dosing. - Continue statin - Continue BP monitoring -> allowing for mild permissive hypertension: we basically talked about target range of blood pressures for him being acceptable from 110 to 528 mmHg systolic.  For pressures greater than 160, he should take an extra dose of amlodipine.  Fearful of hypotension related orthostatic symptoms.  He feels more energetic with blood pressures that are more in the 130 to 140 mmHg range.      Relevant Medications   lisinopril (ZESTRIL) 40 MG tablet   amLODipine (NORVASC) 10 MG tablet   hydrALAZINE (APRESOLINE) 100 MG tablet   Hyperlipidemia associated with type 2 diabetes mellitus (HCC) (Chronic)  No benefit in symptoms with statin holiday.  Remains on Zetia and simvastatin 20 mg.  He tolerates simvastatin better than atorvastatin and rosuvastatin.  Labs were just checked last month (January 2023) and LDL looks great.  No changes.      Relevant Medications   lisinopril (ZESTRIL) 40 MG tablet   amLODipine (NORVASC) 10 MG tablet   hydrALAZINE (APRESOLINE) 100 MG tablet   Venous stasis of both lower extremities (Chronic)    Actually, his swelling is pretty much minimal today.   He did not really do well with the support stockings because they were too tight around the leg just below the knee.  (We talked about trying above-the-knee stockings that can be rolled down to below the knee and then use Velcro to hold in place. Also discussed with elevation. Lasix has been stopped but spironolactone continued.  Plan: Continue to try different support stockings, and foot elevation as well as pedaling exercises. He is on spironolactone and no longer on loop or thiazide diuretic.      Relevant Medications   lisinopril (ZESTRIL) 40 MG tablet   amLODipine (NORVASC) 10 MG tablet   hydrALAZINE (APRESOLINE) 100 MG tablet   Sinus bradycardia (Chronic)    Not able to tolerate beta-blocker.    His last monitor did not show any evidence of chronotropic incompetence-able to increase heart rate to 130 bpm.  No indication for PPM yet.  Monitor closely.  No AV nodal agents.      Relevant Medications   lisinopril (ZESTRIL) 40 MG tablet   amLODipine (NORVASC) 10 MG tablet   hydrALAZINE (APRESOLINE) 100 MG tablet     Other   Statin myopathy (Chronic)    Interestingly, he did not tolerate rosuvastatin and atorvastatin.  But is tolerating low-dose simvastatin plus Zetia. Lipids from January 2023 look great.  No change.  Continues to active.  Continue dietary modification.      DOE (dyspnea on exertion) (Chronic)    Significantly improved from last month.  I suspect this was all related to his post-COVID symptoms.  Did well on the Myoview.  Nonischemic.       ===================================  HPI:    Arthur Lutz is a 81 y.o. male with a PMH notable for CAD (PTCA/12 of ostial D2 in 2009 and 2013, BMS PCI to RCA 2009, DES PCI of LCx in 2013), HTN (now difficult control), HLD, DM-2 and Chronic Venous Stasis below who presents today for 1 week follow-up.  Seen May 21, 2021 after COVID Hospitalization - ICU care with High Grade Fever & Hypoxic Resp Failure, MSSA PNA  Bacteremia. Carvedilol weaned off, lisinopril dose decreased.  Spironolactone continued CCB weaned off 2/2 edema Imdur continued Statin Holiday (Myopathy) - Pending CVRR evaluation in Jan.   July 06, 2019 seen by Dr. Percival Spanish for HTN Urgency --Home BP checked 233/97 mmHg-went down after taking extra dose of lisinopril.  Was associated with some headache and mild chest discomfort..   This was related to having stopped his carvedilol because of bradycardia and also him stopping spironolactone due to nocturia.   Dr. Percival Spanish added hydralazine 10 mg 3 times daily and told him to take extra doses for higher blood pressures. Hydralazine increased to 50 mg TID by Nicholes Rough, PA (at close f/u appt.)  Wgt trending down. Lipid panel ordered for ~ referral to [facility]. 07/31/2021 by Nicholes Rough, PA -- blood pressures were still elevated, unfortunately he did not increase his hydralazine.  Also discussed Zio  patch monitor.   => Sinus rhythm with bundle branch block.  Heart rate range 35 to 136 bpm.  Average 67.  19 atrial runs the fastest and longest was 16 beats (27.5 seconds with an average heart rate of 132 bpm.  No chronotropic incompetence. Relatively asymptomatic.   Scheduled to see lipid clinic on 1/31  Arthur Lutz was just seen on August 10, 2021: Still noted that his energy level is completely down, not recovered since October.  Some exertional dyspnea but stable.  Off-and-on chest tightness and pressure.  Wife indicated that he was complaining of chest pressure over the last few weeks.  (This is not mentioned on the other visits.)  No PND, orthopnea with trivial edema.  No sense of irregular heartbeats.  No change in fatigue with being off statin.  Blood pressure log ranged from 154/72 to 108/88; most pressures 1 62-9 80 and diastolic 47M to 54Y. Increased Imdur to 60 mg and hydralazine to 100 mg 3 times daily. Restarted spironolactone and Zetia => lipids reordered.  (Noted  intolerance of atorvastatin and rosuvastatin but did okay with low-dose simvastatin) Indicated that he was not currently on any type of medication for diabetes. Myoview ordered   Recent Hospitalizations: n/a  Reviewed  CV studies:    The following studies were reviewed today: (if available, images/films reviewed: From Epic Chart or Care Everywhere) Myoview 08/29/2021: LOW RISK.  EF 60 to 65% (61%).  No evidence of ischemia or infarction.  Small apical defect likely related to apical thinning.  Interval History:   Arthur Lutz returns here today for close follow-up to discuss results of his Myoview stress test.  Patient has been doing well since his Myoview stress test.  He reports that he has been taking the medications as prescribed and has not had any of this chest discomfort from previously.  Regarding medications his wife reports that he is still taking the amlodipine as well as the lisinopril although it is no longer on his medication list.  Unclear on which dose he is taking.  Denies any low blood pressures.  Also reports his blood pressures have not been as high as they were before his current medication regimen.  Currently taking unknown dose of amlodipine and lisinopril.  Regarding the hydralazine he could not tolerate the 100 mg dose of he is taking 50 mg 3 times a day.  When he took the 100 mg dose he became lightheaded and nauseous.  Overall "Arthur Lutz" seems to be doing a lot better.  He seems to have finally gained his energy level back having recovered from his COVID infection.  He is not having the chest tightness or pressure symptoms that he was having.  He still has some swelling but it pretty well controlled.  Some venous stasis changes noted, but no sores or wounds. Blood pressures seem to have stabilized.  He was able to tolerate the further titration of hydralazine but is back on his lisinopril and amlodipine even though they are not currently listed.  (This was confirmed via  telephone call by the wife after the visit)  CV Review of Symptoms (Summary) Cardiovascular ROS: Negative for chest pain, shortness of breath,.  Positive for lower extremity edema which is chronic.  : no chest pain or dyspnea on exertion positive for - edema and -notably improved exercise intolerance.  Still has some dizziness/lightheadedness but improved negative for - irregular heartbeat, orthopnea, palpitations, paroxysmal nocturnal dyspnea, rapid heart rate, shortness of breath, or  syncope/near syncope or TIA/amaurosis fugax, claudication.   REVIEWED OF SYSTEMS   Review of Systems  Eyes:  Negative for blurred vision.  Respiratory:  Negative for shortness of breath.   Cardiovascular:  Positive for leg swelling. Negative for chest pain, palpitations and PND.  Gastrointestinal:  Positive for nausea.  Genitourinary:  Positive for frequency and urgency.  Musculoskeletal:  Positive for joint pain.  Neurological:  Positive for dizziness. Negative for weakness and headaches.   I have reviewed and (if needed) personally updated the patient's problem list, medications, allergies, past medical and surgical history, social and family history.   PAST MEDICAL HISTORY   Past Medical History:  Diagnosis Date   Allergic rhinitis    Aortic valve sclerosis    Cause of murmur   Arthritis of knee, left, needs total Knee in future with Dr. Wynelle Link  08/07/2011   Bladder cancer ([place]) 11/2005   CAD S/P percutaneous coronary angioplasty 08/07/2011   NEW 08/07/11 cutting balloon PTCA to 95% ostial diag. and 90%  proximal LCX.  prior cutting balloon and to RCA  & ostium of ostial diag; Last Cath 02/2012 - Patent Diag PTCA, RCA & Circ BMS stents. Normal EF & EDP; Myoview 2/14 no evidence of ischemia or infarction   Diabetes mellitus type 2, controlled, with complications ([place])    Diabetes mellitus without complication ([place])    Dyslipidemia    History of heart attack 11/2007   "mild; did not affect the heart  muscle"; PCI to RCA, Cutting PTCA Diag ostium   Hypertension    Macular degeneration    Macular degeneration of right eye    Now getting shots.    Melanoma of skin ([place]) ~ 2005   "level 2; on my back"   Myocardial infarction [facility]) 2012   Osteoarthritis    Status post right knee arthroplasty, positive for staph infection. Pending right arthroplasty   Portacath in place 08/11/2012   Statin intolerance 08/07/2011   Stroke ([place]) 1980's   "had facial strokes; 2; about a year apart; never did find what caused them"    PAST SURGICAL HISTORY   Past Surgical History:  Procedure Laterality Date   7-day ZIO PATCH MONITOR  06/2021   Predominant rhythm-sinus w/ bundle branch block/IVCD noted.  HR range 35-136 bpm.  Average 67 bpm. HR 35 bpm @ 1AM. 19 atrial runs: Fastest and longest interval was 27.5 seconds (60 beats) with an average heart rate of 132 bpm, max 54 bpm =>occasionally w/ aberrancy & NOT not noted on diary.  Occasional isolated PACs.  No sustained tachy or bradycardic arrhythmia.  No chronotropic incompetence.   BIOPSY  05/28/2019   Procedure: BIOPSY;  Surgeon: Rogene Houston, MD;  Location: [facility];  Service: Endoscopy;;  cecum   CATARACT EXTRACTION W/ INTRAOCULAR LENS IMPLANT  ~ 2010   right   CATARACT EXTRACTION W/PHACO Left 11/21/2016   Procedure: CATARACT EXTRACTION PHACO AND INTRAOCULAR LENS PLACEMENT ([place]) CDE - 11.70 ;  Surgeon: Tonny Branch, MD;  Location: [facility] ORS;  Service: Ophthalmology;  Laterality: Left;  left   cold cup removal bladder lesion  11/2005   "malignant"   COLONOSCOPY N/A 12/09/2014   Procedure: COLONOSCOPY;  Surgeon: Rogene Houston, MD;  Location: [facility];  Service: Endoscopy;  Laterality: N/A;  955 - moved to 8:30 - Ann to notify pt   COLONOSCOPY WITH PROPOFOL N/A 05/28/2019   Procedure: COLONOSCOPY WITH PROPOFOL;  Surgeon: Rogene Houston, MD;  [facility]: [facility];  Service: Endoscopy;  Laterality: N/A;   CORONARY ANGIOPLASTY WITH STENT  PLACEMENT  11/2007   Mid RCA - Driver BMS 3.5 mm x 15 mm; 2.0 mm Cutting Balloon PTCA - ostial D1   CORONARY ANGIOPLASTY WITH STENT PLACEMENT  08/07/2011   Abnormal TM Myoview - for exertional throat discomfort: Patent RCA stent, 90% circumflex -- Vision BMS 3.5 mm x 12 mm --> 4.2 mm. Ostial D2 95% -- 2.25 mm Cutting Balloon PTCA   DOPPLER ECHOCARDIOGRAPHY  11/2007   Normal EF, impaired relaxation, with sclerotic aortic valve. No stenosis.   INGUINAL HERNIA REPAIR  ~ 1970   left   KNEE ARTHROSCOPY  02/20/2011   left   LEFT HEART CATHETERIZATION WITH CORONARY ANGIOGRAM N/A 08/07/2011   Procedure: LEFT HEART CATHETERIZATION WITH CORONARY ANGIOGRAM;  Surgeon: Leonie Man, MD;  Location: [facility] CATH LAB;  Service: Cardiovascular;  Laterality: N/A;   LEFT HEART CATHETERIZATION WITH CORONARY ANGIOGRAM N/A 03/06/2012   Procedure: LEFT HEART CATHETERIZATION WITH CORONARY ANGIOGRAM;  Surgeon: Leonie Man, MD;  Location: [facility] CATH LAB;  Service: Cardiovascular;;Widely patent RCA and LCx stents. Also patent PTCA site to D1, ~40-50% D2. Normal EF, normal EDP   NM MYOVIEW LTD  08/29/2021   a) 07/2012: Low risk, EF 49%, improved from prior. Small apical and apical septal defect, fixed likely artifact no skin or infarction.; b)  08/2021: LOW RISK.  EF 60 to 65% (61%).  No evidence of ischemia or infarction.  Small apical defect likely related to apical thinning.   PARTIAL KNEE ARTHROPLASTY  06/2003   right   PERCUTANEOUS CORONARY STENT INTERVENTION (PCI-S)  08/07/2011   Procedure: PERCUTANEOUS CORONARY STENT INTERVENTION (PCI-S);  Surgeon: Leonie Man, MD;  Location[facility] CATH LAB;  Service: Cardiovascular;;   POLYPECTOMY  05/28/2019   Procedure: POLYPECTOMY;  Surgeon: Rogene Houston, MD;  [facility];  Service: Endoscopy;;  colon   TONSILLECTOMY     "as a child"   TOTAL KNEE ARTHROPLASTY  11/2003   right x2 and one on left- Total of 3.   TOTAL KNEE ARTHROPLASTY Left 09/28/2012    Procedure: TOTAL KNEE ARTHROPLASTY;  Surgeon: Gearlean Alf, MD;  Location: [facility] ORS;  Service: Orthopedics;  Laterality: Left;   TRANSTHORACIC ECHOCARDIOGRAM  05/14/2021   EF 55 to 60%.  No R WMA.  GR 1 DD.   Mild LA dilation. Normal PAP and RAP.  Essentially normal.    Immunization History  Administered Date(s) Administered   Influenza-Unspecified 05/29/2018   Tdap 02/01/2017    MEDICATIONS/ALLERGIES   Current Meds  Medication Sig   acetaminophen (TYLENOL) 650 MG CR tablet Take 650 mg by mouth every 8 (eight) hours as needed for pain.   amLODipine (NORVASC) 10 MG tablet Take 10 mg by mouth daily.   Azelastine HCl 0.15 % SOLN Place 1 spray into the nose.   Cholecalciferol (VITAMIN D) 125 MCG (5000 UT) CAPS Take 5,000 Units by mouth daily.   clobetasol (TEMOVATE) 0.05 % external solution Apply 1 application topically daily as needed (irritation).   Coenzyme Q10 (COQ10) 100 MG CAPS Take 1 capsule by mouth daily.   EPINEPHrine 0.3 mg/0.3 mL IJ SOAJ injection Inject 0.3 mg into the muscle as needed for anaphylaxis.   ezetimibe (ZETIA) 10 MG tablet Take 1 tablet (10 mg total) by mouth daily.   Fluticasone Propionate (XHANCE) 93 MCG/ACT EXHU Place 2 application into the nose 2 (two) times daily. (Patient taking differently: as needed.)   isosorbide mononitrate (  IMDUR) 60 MG 24 hr tablet Take 1 tablet (60 mg total) by mouth daily.   lisinopril (ZESTRIL) 40 MG tablet Take 40 mg by mouth daily.   loperamide (IMODIUM) 2 MG capsule Take 1 capsule (2 mg total) by mouth 4 (four) times daily as needed for diarrhea or loose stools.   LOTEMAX 0.5 % OINT Place 1 application into the left eye daily as needed (irritation, dry eyes).   magnesium gluconate (MAGONATE) 500 MG tablet Take 500 mg by mouth daily.   meloxicam (MOBIC) 15 MG tablet meloxicam 15 mg tablet  TAKE 1 TABLET BY MOUTH EVERY DAY WITH FOOD FOR INFLAMMATION   Multiple Vitamins-Minerals (PRESERVISION AREDS PO) Take 1 capsule by mouth daily.    prednisoLONE acetate (PRED FORTE) 1 % ophthalmic suspension Place 1 drop into both eyes 2 (two) times daily as needed (irritation).   simvastatin (ZOCOR) 20 MG tablet Take 1 tablet (20 mg total) by mouth at bedtime.   spironolactone (ALDACTONE) 25 MG tablet Take 0.5 tablets (12.5 mg total) by mouth every morning.   tacrolimus (PROTOPIC) 0.1 % ointment Apply topically 2 (two) times daily.   tobramycin (TOBREX) 0.3 % ophthalmic solution Place 1 drop into both eyes daily as needed (before , during and after injection).   triazolam (HALCION) 0.125 MG tablet Take 1.5 tablets (0.1875 mg total) by mouth at bedtime. Pt takes 1.5 tabs at bedtime (Patient taking differently: Take 0.25 mg by mouth at bedtime. Pt takes 2 tabs at bedtime)   []  Coenzyme Q10 (CO Q-10) 100 MG CAPS Take 300 capsules by mouth 3 (three) times daily. (Patient taking differently: Take 1 capsule by mouth daily.)   []  hydrALAZINE (APRESOLINE) 100 MG tablet Taking one half tablet (50 mg total) by mouth 3 (three) times daily. ->  Was not able to tolerate increased dose to 100 mg    Allergies  Allergen Reactions   Aspirin Other (See Comments)    Was told to not take this due to having macular degeneration   Rosuvastatin Calcium Other (See Comments)    Aches and cramps   Other Rash and Other (See Comments)    Red meat = develops a rash on the back    SOCIAL HISTORY/FAMILY HISTORY   Reviewed in Epic:  Pertinent findings:  Social History   Tobacco Use   Smoking status: Former    Packs/day: 2.00    Years: 28.00    Pack years: 56.00    Types: Cigarettes    Quit date: 07/29/1980    Years since quitting: 41.1   Smokeless tobacco: Never  Vaping Use   Vaping Use: Never used  Substance Use Topics   Alcohol use: Yes    Alcohol/week: 2.0 standard drinks    Types: 1 Glasses of wine, 1 Standard drinks or equivalent per week    Comment:  "wine or whiskey" occasionally   Drug use: No   Social History   Social History Narrative    He is a married father of 2, grandfather of 26. He is not really getting a lot of exercise now. He previously had been working out on a treadmill at least 2 times a day for 15 minutes at a time, but he   is not able to do that as much now because his knee hurts him too bad. Does not smoke and only occasional alcoholic beverage.    OBJCTIVE -PE, EKG, labs   Wt Readings from Last 3 Encounters:  09/14/21 246 lb (111.6 kg)  08/29/21  249 lb (112.9 kg)  08/10/21 249 lb (112.9 kg)    Physical Exam: BP (!) 144/78    Pulse 60    Resp 16    Ht 6' (1.829 m)    Wt 246 lb (111.6 kg)    SpO2 95%    BMI 33.36 kg/m  Physical Exam Vitals reviewed.  Constitutional:      General: He is not in acute distress (Back to baseline).    Appearance: Normal appearance. He is obese. He is not ill-appearing.     Comments: Well-nourished, well-groomed.  Healthy-appearing.  No longer feels worn out.  Seems to be back to his baseline self.  HENT:     Head: Normocephalic and atraumatic.  Neck:     Vascular: Normal carotid pulses. No carotid bruit or JVD.  Cardiovascular:     Rate and Rhythm: Normal rate and regular rhythm. No extrasystoles are present.    Chest Wall: PMI is not displaced.     Pulses: Decreased pulses (Diminished, but palpable.  Feet are warm.).     Heart sounds: S1 normal and S2 normal. Heart sounds are distant. No murmur heard.   No friction rub. No gallop.     Comments: Normal S1, split S2. Pulmonary:     Effort: Pulmonary effort is normal. No respiratory distress.     Breath sounds: Normal breath sounds. No wheezing, rhonchi or rales.  Chest:     Chest wall: No tenderness.  Musculoskeletal:        General: Swelling (Symptoms 1 pitting bilaterally) present.     Cervical back: Normal range of motion and neck supple.  Skin:    General: Skin is warm and dry.     Findings: Bruising: Mild.  Neurological:     General: No focal deficit present.     Mental Status: He is alert and oriented to  person, place, and time.     Gait: Abnormal gait: Somewhat slow gait.  Psychiatric:        Mood and Affect: Mood normal.        Behavior: Behavior normal.        Thought Content: Thought content normal.        Judgment: Judgment normal.   - mild BLE Venous Stasis Derm changes   Adult ECG Report Not checked  Recent Labs: Reviewed Lab Results  Component Value Date   CHOL 112 08/20/2021   HDL 36 (L) 08/20/2021   LDLCALC 56 08/20/2021   TRIG 105 08/20/2021   CHOLHDL 3.1 08/20/2021   Lab Results  Component Value Date   CREATININE 0.93 08/20/2021   BUN 17 08/20/2021   NA 143 08/20/2021   K 4.1 08/20/2021   CL 105 08/20/2021   CO2 26 08/20/2021   CBC Latest Ref Rng & Units 05/16/2021 05/15/2021 05/14/2021  WBC 4.0 - 10.5 K/uL 7.7 9.6 12.7(H)  Hemoglobin 13.0 - 17.0 g/dL 12.4(L) 12.2(L) 12.5(L)  Hematocrit 39.0 - 52.0 % 38.6(L) 36.1(L) 38.2(L)  Platelets 150 - 400 K/uL 149(L) 123(L) 135(L)    Lab Results  Component Value Date   HGBA1C 6.9 (H) 05/13/2021   Lab Results  Component Value Date   TSH 1.760 08/21/2020    ==================================================  COVID-19 Education: The signs and symptoms of COVID-19 were discussed with the patient and how to seek care for testing (follow up with PCP or arrange E-visit).    Resident spent a total of 18 minutes with the patient in direct consultation in addition to 15 minutes in  chart review/charting.  Total 33 minutes  Attending spent additional 8 minutes with the patient and 6 minutes charting.  Total 14 minutes  Total Time: 47 min  Current medicines are reviewed at length with the patient today.  (+/- concerns) none  This visit occurred during the SARS-CoV-2 public health emergency.  Safety protocols were in place, including screening questions prior to the visit, additional usage of staff PPE, and extensive cleaning of exam room while observing appropriate contact time as indicated for disinfecting  solutions.  Notice: This dictation was prepared with Dragon dictation along with smart phrase technology. Any transcriptional errors that result from this process are unintentional and may not be corrected upon review.  Studies Ordered:   No orders of the defined types were placed in this encounter.   Patient Instructions / Medication Changes & Studies & Tests Ordered   Patient Instructions  Medication Instructions:   Call  back the office  with   Amlodipine  and Lisinopril    Continue all medicaitons *If you need a refill on your cardiac medications before your next appointment, please call your pharmacy*   Lab Work: Not needed    Testing/Procedures:  Not needed  Follow-Up: At [facility], you and your health needs are our priority.  As part of our continuing mission to provide you with exceptional heart care, we have created designated Provider Care Teams.  These Care Teams include your primary Cardiologist (physician) and Advanced Practice Providers (APPs -  Physician Assistants and Nurse Practitioners) who all work together to provide you with the care you need, when you need it.     Your next appointment:   6 month(s)  The format for your next appointment:   In Person  Provider:   Caron Presume, PA-C, Jory Sims, DNP, ANP, or Vara Guardian PA    Then, Glenetta Hew, MD will plan to see you again in 12 month(s).   Other instruction  ( Patient 's wife called back with information about  current medication in question  Amlodipine, Lisinopril, Isosorbide,hydralazine)  -> Called back.  Lisinopril 40 mg daily, amlodipine 10   Signed,  Gifford Shave, MD (Fam Med R3)   ATTENDING ATTESTATION  I have seen, examined and evaluated the patient along with the Resident Physician in clinic today.  I personally performed my own interview & exanimation.  After reviewing all the available data and chart, we discussed the patients laboratory, study & physical  findings as well as symptoms in detail. I agree with his findings, examination as well as impression recommendations as per our discussion.    Attending adjustments int the full clinic noted annotated in [place].    Signed, Glenetta Hew, M.D., M.S.  Glenetta Hew, M.D., M.S. Interventional Cardiologist   Pager # [phone] Phone # [phone] [address]. [place], [place] [address]   Thank you for choosing Heartcare at [facility]!!

## 2021-09-14 NOTE — Assessment & Plan Note (Addendum)
Patient's symptoms are continuing to improve.  Myoview stress test within normal limits.  Can still not initiate beta-blocker due to bradycardia and fatigue - Continue amlodipine - Aspirin 81 mg daily - Continue Zetia and simvastatin - Continue Imdur 60 mg  I suspect of the chest pressure he was having was probably still all post COVID-related.  Feeling much better now.  Active without any more chest pain.  Happy to hear about Myoview results.  Clearly on beta-blocker because of bradycardia.    For antianginal benefit he is on Imdur plus amlodipine  Remains on stable ACE inhibitor dose for afterload reduction  Lipids well controlled on Zetia and simvastatin  Need to restart aspirin 81 mg

## 2021-09-14 NOTE — Assessment & Plan Note (Addendum)
Patient completed Myoview after last visit.  Myoview within normal limits showing normal heart function. - Continue Imdur 60 mg daily along with amlodipine 10 mg - Continue to monitor for symptoms

## 2021-09-14 NOTE — Progress Notes (Deleted)
Primary Care Provider: Redmond School, MD Cardiologist: Glenetta Hew, MD Electrophysiologist: None  Clinic Note: No chief complaint on file.  ===================================  ASSESSMENT/PLAN   Problem List Items Addressed This Visit       Cardiology Problems   CAD S/P percutaneous coronary angioplasty: 08/07/11 cutting balloon PTCA to 95% ostial diag. and 90%  proximal LCX.  prior cutting balloon to RCA  & ostium of ostial diag., 2009  (Chronic)   Chronic stable angina (HCC) - negative Myoview & no new Dz on Cath (Chronic)   Essential hypertension (Chronic)   Hyperlipidemia associated with type 2 diabetes mellitus (HCC) (Chronic)   Venous stasis of both lower extremities - Primary (Chronic)   Sinus bradycardia (Chronic)     Other   Statin myopathy (Chronic)   DOE (dyspnea on exertion) (Chronic)    ===================================  HPI:    Arthur Lutz is a 81 y.o. male with a PMH notable for CAD (PTCA/12 of ostial D2 in 2009 and 2013, BMS PCI to RCA 2009, DES PCI of LCx in 2013), HTN (now difficult control), HLD, DM-2 and Chronic Venous Stasis below who presents today for 1 week follow-up.  Seen May 21, 2021 after COVID Hospitalization - ICU care with High Grade Fever & Hypoxic Resp Failure, MSSA PNA Bacteremia. Carvedilol weaned off, lisinopril dose decreased.  Spironolactone continued CCB weaned off 2/2 edema Imdur continued Statin Holiday (Myopathy) - Pending CVRR evaluation in Jan.   July 06, 2019 seen by Dr. Percival Spanish for HTN Urgency --Home BP checked 233/97 mmHg-went down after taking extra dose of lisinopril.  Was associated with some headache and mild chest discomfort..   This was related to having stopped his carvedilol because of bradycardia and also him stopping spironolactone due to nocturia.   Dr. Percival Spanish added hydralazine 10 mg 3 times daily and told him to take extra doses for higher blood pressures. Hydralazine increased to 50 mg TID by  Nicholes Rough, PA (at close f/u appt.)  Wgt trending down. Lipid panel ordered for ~ referral to [facility]. 07/31/2021 by Nicholes Rough, PA -- blood pressures were still elevated, unfortunately he did not increase his hydralazine.  Also discussed Zio patch monitor.   => Sinus rhythm with bundle branch block.  Heart rate range 35 to 136 bpm.  Average 67.  19 atrial runs the fastest and longest was 16 beats (27.5 seconds with an average heart rate of 132 bpm.  No chronotropic incompetence. Relatively asymptomatic.   Scheduled to see lipid clinic on 1/31  Arthur Lutz was just seen on August 10, 2021: Still noted that his energy level is completely down, not recovered since October.  Some exertional dyspnea but stable.  Off-and-on chest tightness and pressure.  Wife indicated that he was complaining of chest pressure over the last few weeks.  (This is not mentioned on the other visits.)  No PND, orthopnea with trivial edema.  No sense of irregular heartbeats.  No change in fatigue with being off statin.  Blood pressure log ranged from 154/72 to 108/88; most pressures 1 24-0 80 and diastolic 97D to 53G. Increased Imdur to 60 mg and hydralazine to 100 mg 3 times daily. Restarted spironolactone and Zetia => lipids reordered.  (Noted intolerance of atorvastatin and rosuvastatin but did okay with low-dose simvastatin) Indicated that he was not currently on any type of medication for diabetes. Myoview ordered   Recent Hospitalizations: n/a  Reviewed  CV studies:    The following studies  were reviewed today: (if available, images/films reviewed: From Epic Chart or Care Everywhere) Myoview 08/29/2021: LOW RISK.  EF 60 to 65% (61%).  No evidence of ischemia or infarction.  Small apical defect likely related to apical thinning.  Interval History:   Arthur Lutz returns here today for close follow-up to discuss results of his Myoview stress test.***  CV Review of Symptoms (Summary) Cardiovascular  ROS: positive for - chest pain, dyspnea on exertion, edema, orthopnea, and -orthopnea is really just how he sleeps for comfort; significant fatigue, exercise intolerance. negative for - irregular heartbeat, palpitations, paroxysmal nocturnal dyspnea, rapid heart rate, shortness of breath, or syncope/near syncope.  A/amaurosis fugax.  Claudication  REVIEWED OF SYSTEMS   ROS  for malaise/fatigue (Extremely worn out now.  Much more so than usual.  Prehospital, he still had those fatigue episodes with bradycardia.) and weight loss (Since being in the hospital.  About 10+ pounds).  HENT:  Negative for congestion and nosebleeds.   Respiratory:  Positive for shortness of breath.   Cardiovascular:  Positive for chest pain and leg swelling (Much better control.).  Gastrointestinal:  Positive for constipation (Off and on). Negative for abdominal pain, blood in stool and melena.  Genitourinary:  Positive for frequency (Urinates a lot with Lasix.).  Musculoskeletal:  Negative for falls, joint pain and myalgias.  Neurological:  Positive for dizziness (Lightheaded and dizzy.), weakness (Feels weak and lethargic since his hospital stay.-Not unexpected) and headaches. Negative for speech change and focal weakness.  Psychiatric/Behavioral:  Negative for depression (Sounds a little like dysthymia.) and memory loss. The patient is nervous/anxious. The patient   I have reviewed and (if needed) personally updated the patient's problem list, medications, allergies, past medical and surgical history, social and family history.   PAST MEDICAL HISTORY   Past Medical History:  Diagnosis Date   Allergic rhinitis    Aortic valve sclerosis    Cause of murmur   Arthritis of knee, left, needs total Knee in future with Dr. Wynelle Link  08/07/2011   Bladder cancer ([place]) 11/2005   CAD S/P percutaneous coronary angioplasty 08/07/2011   NEW 08/07/11 cutting balloon PTCA to 95% ostial diag. and 90%  proximal LCX.  prior cutting  balloon and to RCA  & ostium of ostial diag; Last Cath 02/2012 - Patent Diag PTCA, RCA & Circ BMS stents. Normal EF & EDP; Myoview 2/14 no evidence of ischemia or infarction   Diabetes mellitus type 2, controlled, with complications ([place])    Diabetes mellitus without complication ([place])    Dyslipidemia    History of heart attack 11/2007   "mild; did not affect the heart muscle"; PCI to RCA, Cutting PTCA Diag ostium   Hypertension    Macular degeneration    Macular degeneration of right eye    Now getting shots.    Melanoma of skin ([place]) ~ 2005   "level 2; on my back"   Myocardial infarction [facility]) 2012   Osteoarthritis    Status post right knee arthroplasty, positive for staph infection. Pending right arthroplasty   Portacath in place 08/11/2012   Statin intolerance 08/07/2011   Stroke ([place]) 1980's   "had facial strokes; 2; about a year apart; never did find what caused them"    PAST SURGICAL HISTORY   Past Surgical History:  Procedure Laterality Date   7-day ZIO PATCH MONITOR  06/2021   Predominant rhythm-sinus w/ bundle branch block/IVCD noted.  HR range 35-136 bpm.  Average 67 bpm. HR 35 bpm @  1AM. 19 atrial runs: Fastest and longest interval was 27.5 seconds (60 beats) with an average heart rate of 132 bpm, max 54 bpm =>occasionally w/ aberrancy & NOT not noted on diary.  Occasional isolated PACs.  No sustained tachy or bradycardic arrhythmia.  No chronotropic incompetence.   BIOPSY  05/28/2019   Procedure: BIOPSY;  Surgeon: Rogene Houston, MD;  Location: [facility] ENDO [facility];  Service: Endoscopy;;  cecum   CATARACT EXTRACTION W/ INTRAOCULAR LENS IMPLANT  ~ 2010   right   CATARACT EXTRACTION W/PHACO Left 11/21/2016   Procedure: CATARACT EXTRACTION PHACO AND INTRAOCULAR LENS PLACEMENT ([place]) CDE - 11.70 ;  Surgeon: Tonny Branch, MD;  Location: [facility] ORS;  Service: Ophthalmology;  Laterality: Left;  left   cold cup removal bladder lesion  11/2005   "malignant"   COLONOSCOPY N/A 12/09/2014    Procedure: COLONOSCOPY;  Surgeon: Rogene Houston, MD;  Location: [facility] END[facility];  Service: Endoscopy;  Laterality: N/A;  955 - moved to 8:30 - Ann to notify pt   COLONOSCOPY WITH PROPOFOL N/A 05/28/2019   Procedure: COLONOSCOPY WITH PROPOFOL;  Surgeon: Rogene Houston, MD;  Location: [facility];  Service: Endoscopy;  Laterality: N/A;   CORONARY ANGIOPLASTY WITH STENT PLACEMENT  11/2007   Mid RCA - Driver BMS 3.5 mm x 15 mm; 2.0 mm Cutting Balloon PTCA - ostial D1   CORONARY ANGIOPLASTY WITH STENT PLACEMENT  08/07/2011   Abnormal TM Myoview - for exertional throat discomfort: Patent RCA stent, 90% circumflex -- Vision BMS 3.5 mm x 12 mm --> 4.2 mm. Ostial D2 95% -- 2.25 mm Cutting Balloon PTCA   DOPPLER ECHOCARDIOGRAPHY  11/2007   Normal EF, impaired relaxation, with sclerotic aortic valve. No stenosis.   INGUINAL HERNIA REPAIR  ~ 1970   left   KNEE ARTHROSCOPY  02/20/2011   left   LEFT HEART CATHETERIZATION WITH CORONARY ANGIOGRAM N/A 08/07/2011   Procedure: LEFT HEART CATHETERIZATION WITH CORONARY ANGIOGRAM;  Surgeon: Leonie Man, MD;  Location: [facility] CATH LAB;  Service: Cardiovascular;  Laterality: N/A;   LEFT HEART CATHETERIZATION WITH CORONARY ANGIOGRAM N/A 03/06/2012   Procedure: LEFT HEART CATHETERIZATION WITH CORONARY ANGIOGRAM;  Surgeon: Leonie Man, MD;  Location: [facility] CATH LAB;  Service: Cardiovascular;;Widely patent RCA and LCx stents. Also patent PTCA site to D1, ~40-50% D2. Normal EF, normal EDP   NM MYOVIEW LTD  08/28/2012   Low risk, EF 49%, improved from prior. Small apical and apical septal defect, fixed likely artifact no skin or infarction.   PARTIAL KNEE ARTHROPLASTY  06/2003   right   PERCUTANEOUS CORONARY STENT INTERVENTION (PCI-S)  08/07/2011   Procedure: PERCUTANEOUS CORONARY STENT INTERVENTION (PCI-S);  Surgeon: Leonie Man, MD;  Location: [facility] CATH LAB;  Service: Cardiovascular;;   POLYPECTOMY  05/28/2019   Procedure: POLYPECTOMY;  Surgeon: Rogene Houston,  MD;  [facility];  Service: Endoscopy;;  colon   TONSILLECTOMY     "as a child"   TOTAL KNEE ARTHROPLASTY  11/2003   right x2 and one on left- Total of 3.   TOTAL KNEE ARTHROPLASTY Left 09/28/2012   Procedure: TOTAL KNEE ARTHROPLASTY;  Surgeon: Gearlean Alf, MD;  Location: [facility] ORS;  Service: Orthopedics;  Laterality: Left;   TRANSTHORACIC ECHOCARDIOGRAM  05/14/2021   EF 55 to 60%.  No R WMA.  GR 1 DD.   Mild LA dilation. Normal PAP and RAP.  Essentially normal.    Immunization History  Administered Date(s) Administered   Influenza-Unspecified  05/29/2018   Tdap 02/01/2017    MEDICATIONS/ALLERGIES   No outpatient medications have been marked as taking for the 09/14/21 encounter (Appointment) with Leonie Man, MD.    Allergies  Allergen Reactions   Aspirin Other (See Comments)    Was told to not take this due to having macular degeneration   Rosuvastatin Calcium Other (See Comments)    Aches and cramps   Other Rash and Other (See Comments)    Red meat = develops a rash on the back    SOCIAL HISTORY/FAMILY HISTORY   Reviewed in Epic:  Pertinent findings:  Social History   Tobacco Use   Smoking status: Former    Packs/day: 2.00    Years: 28.00    Pack years: 56.00    Types: Cigarettes    Quit date: 07/29/1980    Years since quitting: 41.1   Smokeless tobacco: Never  Vaping Use   Vaping Use: Never used  Substance Use Topics   Alcohol use: Yes    Alcohol/week: 2.0 standard drinks    Types: 1 Glasses of wine, 1 Standard drinks or equivalent per week    Comment:  "wine or whiskey" occasionally   Drug use: No   Social History   Social History Narrative   He is a married father of 2, grandfather of 37. He is not really getting a lot of exercise now. He previously had been working out on a treadmill at least 2 times a day for 15 minutes at a time, but he   is not able to do that as much now because his knee hurts him too bad. Does not smoke and only  occasional alcoholic beverage.    OBJCTIVE -PE, EKG, labs   Wt Readings from Last 3 Encounters:  08/29/21 249 lb (112.9 kg)  08/10/21 249 lb (112.9 kg)  07/31/21 246 lb 9.6 oz (111.9 kg)    Physical Exam: There were no vitals taken for this visit. Physical Exam Vitals reviewed.  Constitutional:      General: He is not in acute distress (Just seems to be).    Appearance: Normal appearance. He is obese.     Comments: Seems worn out.  Well-groomed.  HENT:     Head: Normocephalic and atraumatic.  Neck:     Vascular: No carotid bruit or JVD.  Cardiovascular:     Rate and Rhythm: Normal rate and regular rhythm. Occasional Extrasystoles are present.    Pulses: Decreased pulses (Diminished, but palpable.  Feet are warm.).     Heart sounds: Heart sounds are distant. No murmur heard.   No friction rub. No gallop.     Comments: Normal S1, split S2. Pulmonary:     Effort: Pulmonary effort is normal. No respiratory distress.     Breath sounds: Normal breath sounds. No wheezing, rhonchi or rales.  Chest:     Chest wall: No tenderness.  Musculoskeletal:        General: Swelling (Trace bilateral ankle) present.     Cervical back: Normal range of motion and neck supple.  Skin:    General: Skin is warm and dry.     Findings: Bruising (Mild) present.  Neurological:     General: No focal deficit present.     Mental Status: He is alert and oriented to person, place, and time.     Gait: Gait abnormal (Somewhat slow gait).  Psychiatric:        Behavior: Behavior normal.  Thought Content: Thought content normal.        Judgment: Judgment normal.     Comments: Seems somewhat down.     Adult ECG Report Not checked  Recent Labs: Reviewed Lab Results  Component Value Date   CHOL 112 08/20/2021   HDL 36 (L) 08/20/2021   LDLCALC 56 08/20/2021   TRIG 105 08/20/2021   CHOLHDL 3.1 08/20/2021   Lab Results  Component Value Date   CREATININE 0.93 08/20/2021   BUN 17 08/20/2021    NA 143 08/20/2021   K 4.1 08/20/2021   CL 105 08/20/2021   CO2 26 08/20/2021   CBC Latest Ref Rng & Units 05/16/2021 05/15/2021 05/14/2021  WBC 4.0 - 10.5 K/uL 7.7 9.6 12.7(H)  Hemoglobin 13.0 - 17.0 g/dL 12.4(L) 12.2(L) 12.5(L)  Hematocrit 39.0 - 52.0 % 38.6(L) 36.1(L) 38.2(L)  Platelets 150 - 400 K/uL 149(L) 123(L) 135(L)    Lab Results  Component Value Date   HGBA1C 6.9 (H) 05/13/2021   Lab Results  Component Value Date   TSH 1.760 08/21/2020    ==================================================  COVID-19 Education: The signs and symptoms of COVID-19 were discussed with the patient and how to seek care for testing (follow up with PCP or arrange E-visit).    I spent a total of 43 minutes with the patient spent in direct patient consultation.  Additional time spent with chart review  / charting (studies, outside notes, etc): 33 min Total Time: 76 min  Current medicines are reviewed at length with the patient today.  (+/- concerns) none  This visit occurred during the SARS-CoV-2 public health emergency.  Safety protocols were in place, including screening questions prior to the visit, additional usage of staff PPE, and extensive cleaning of exam room while observing appropriate contact time as indicated for disinfecting solutions.  Notice: This dictation was prepared with Dragon dictation along with smart phrase technology. Any transcriptional errors that result from this process are unintentional and may not be corrected upon review.  Studies Ordered:   No orders of the defined types were placed in this encounter.  Shared Decision Making/Informed Consent The risks [chest pain, shortness of breath, cardiac arrhythmias, dizziness, blood pressure fluctuations, myocardial infarction, stroke/transient ischemic attack, nausea, vomiting, allergic reaction, radiation exposure, metallic taste sensation and life-threatening complications (estimated to be 1 in 10,000)], benefits (risk  stratification, diagnosing coronary artery disease, treatment guidance) and alternatives of a nuclear stress test were discussed in detail with Mr. Ewart and he agrees to proceed.   Patient Instructions / Medication Changes & Studies & Tests Ordered   There are no Patient Instructions on file for this visit.     Glenetta Hew, M.D., M.S. Interventional Cardiologist   Pager # [phone] Phone # [phone] [address]. [place], [place] [address]   Thank you for choosing Heartcare at [facility]!!

## 2021-09-15 ENCOUNTER — Encounter: Payer: Self-pay | Admitting: Cardiology

## 2021-09-15 NOTE — Assessment & Plan Note (Addendum)
Interestingly, he did not tolerate rosuvastatin and atorvastatin.  But is tolerating low-dose simvastatin plus Zetia. Lipids from January 2023 look great.  No change.  Continues to active.  Continue dietary modification.

## 2021-09-15 NOTE — Assessment & Plan Note (Addendum)
No benefit in symptoms with statin holiday.  Remains on Zetia and simvastatin 20 mg.  He tolerates simvastatin better than atorvastatin and rosuvastatin.  Labs were just checked last month (January 2023) and LDL looks great.  No changes.

## 2021-09-15 NOTE — Assessment & Plan Note (Addendum)
Significantly improved from last month.  I suspect this was all related to his post-COVID symptoms.  Did well on the Myoview.  Nonischemic.

## 2021-09-15 NOTE — Assessment & Plan Note (Signed)
Not able to tolerate beta-blocker.    His last monitor did not show any evidence of chronotropic incompetence-able to increase heart rate to 130 bpm.  No indication for PPM yet.  Monitor closely.  No AV nodal agents.

## 2021-09-15 NOTE — Progress Notes (Signed)
ATTENDING ATTESTATION  I have seen, examined and evaluated the patient along with the Resident Physician (Dr. Caron Presume) in clinic today.  I personally performed my own interview & exanimation.  After reviewing all the available data and chart, we discussed the patients laboratory, study & physical findings as well as symptoms in detail. I agree with his findings, examination as well as impression recommendations as per our discussion.    Attending adjustments int the full clinic noted annotated in [place].   Essential hypertension Patient's blood pressures are improved today from previous evaluation.  Systolic blood pressure 283.  His wife reports he has been taking the hydralazine 50 mg 3 times daily, amlodipine, lisinopril but she is unsure on the dosing of the latter 2.  He is also started taking the spironolactone regularly even though it makes him urinate frequently.  He is taking 12.5 mg of that.  Patient was unable to tolerate the increased dose of hydralazine. - Continue hydralazine 50 mg 3 times daily - Patient is going to call with the dosing for the amlodipine as well as the lisinopril so that we have these on record but we are going to continue his current amlodipine and lisinopril dosing. - Continue statin - Continue BP monitoring -> allowing for mild permissive hypertension: we basically talked about target range of blood pressures for him being acceptable from 110 to 662 mmHg systolic.  For pressures greater than 160, he should take an extra dose of amlodipine.  Fearful of hypotension related orthostatic symptoms.  He feels more energetic with blood pressures that are more in the 130 to 140 mmHg range.  Chronic stable angina (HCC) - negative Myoview & no new Dz on Cath Patient completed Myoview after last visit.  Myoview within normal limits showing normal heart function. - Continue Imdur 60 mg daily along with amlodipine 10 mg - Continue to monitor for symptoms  CAD S/P  percutaneous coronary angioplasty: 08/07/11 cutting balloon PTCA to 95% ostial diag. and 90%  proximal LCX.  prior cutting balloon to RCA  & ostium of ostial diag., 2009  Patient's symptoms are continuing to improve.  Myoview stress test within normal limits.  Can still not initiate beta-blocker due to bradycardia and fatigue - Continue amlodipine - Aspirin 81 mg daily - Continue Zetia and simvastatin - Continue Imdur 60 mg  I suspect of the chest pressure he was having was probably still all post COVID-related.  Feeling much better now.  Active without any more chest pain.  Happy to hear about Myoview results. Clearly on beta-blocker because of bradycardia.   For antianginal benefit he is on Imdur plus amlodipine Remains on stable ACE inhibitor dose for afterload reduction Lipids well controlled on Zetia and simvastatin Need to restart aspirin 81 mg   Venous stasis of both lower extremities Actually, his swelling is pretty much minimal today.  He did not really do well with the support stockings because they were too tight around the leg just below the knee.  (We talked about trying above-the-knee stockings that can be rolled down to below the knee and then use Velcro to hold in place. Also discussed with elevation. Lasix has been stopped but spironolactone continued.  Plan: Continue to try different support stockings, and foot elevation as well as pedaling exercises. He is on spironolactone and no longer on loop or thiazide diuretic.  Sinus bradycardia Not able to tolerate beta-blocker.    His last monitor did not show any evidence of chronotropic incompetence-able  to increase heart rate to 130 bpm.  No indication for PPM yet.  Monitor closely.  No AV nodal agents.  Hyperlipidemia associated with type 2 diabetes mellitus ([place]) No benefit in symptoms with statin holiday.  Remains on Zetia and simvastatin 20 mg.  He tolerates simvastatin better than atorvastatin and  rosuvastatin.  Labs were just checked last month (January 2023) and LDL looks great.  No changes.  Statin myopathy Interestingly, he did not tolerate rosuvastatin and atorvastatin.  But is tolerating low-dose simvastatin plus Zetia. Lipids from January 2023 look great.  No change.  Continues to active.  Continue dietary modification.  DOE (dyspnea on exertion) Significantly improved from last month.  I suspect this was all related to his post-COVID symptoms.  Did well on the Myoview.  Nonischemic.    Glenetta Hew, M.D., M.S. Interventional Cardiologist   Pager # [phone] Phone # [phone] [address] [place], [place] [address]

## 2021-09-15 NOTE — Assessment & Plan Note (Addendum)
Actually, his swelling is pretty much minimal today.  He did not really do well with the support stockings because they were too tight around the leg just below the knee.  (We talked about trying above-the-knee stockings that can be rolled down to below the knee and then use Velcro to hold in place. Also discussed with elevation. Lasix has been stopped but spironolactone continued.  Plan:  Continue to try different support stockings, and foot elevation as well as pedaling exercises.  He is on spironolactone and no longer on loop or thiazide diuretic.

## 2021-09-18 DIAGNOSIS — M25661 Stiffness of right knee, not elsewhere classified: Secondary | ICD-10-CM | POA: Diagnosis not present

## 2021-09-18 DIAGNOSIS — M25562 Pain in left knee: Secondary | ICD-10-CM | POA: Diagnosis not present

## 2021-09-18 DIAGNOSIS — M25561 Pain in right knee: Secondary | ICD-10-CM | POA: Diagnosis not present

## 2021-09-18 DIAGNOSIS — Z96653 Presence of artificial knee joint, bilateral: Secondary | ICD-10-CM | POA: Diagnosis not present

## 2021-09-19 ENCOUNTER — Encounter (INDEPENDENT_AMBULATORY_CARE_PROVIDER_SITE_OTHER): Payer: Self-pay

## 2021-09-20 ENCOUNTER — Telehealth: Payer: Self-pay | Admitting: Cardiology

## 2021-09-20 DIAGNOSIS — Z08 Encounter for follow-up examination after completed treatment for malignant neoplasm: Secondary | ICD-10-CM | POA: Diagnosis not present

## 2021-09-20 DIAGNOSIS — H353211 Exudative age-related macular degeneration, right eye, with active choroidal neovascularization: Secondary | ICD-10-CM | POA: Diagnosis not present

## 2021-09-20 DIAGNOSIS — N401 Enlarged prostate with lower urinary tract symptoms: Secondary | ICD-10-CM | POA: Diagnosis not present

## 2021-09-20 DIAGNOSIS — R3912 Poor urinary stream: Secondary | ICD-10-CM | POA: Diagnosis not present

## 2021-09-20 DIAGNOSIS — N3941 Urge incontinence: Secondary | ICD-10-CM | POA: Diagnosis not present

## 2021-09-20 NOTE — Telephone Encounter (Signed)
Wife stated  patient had asked for a prescription at last appointment .  For when he has flare ups with his arthritis.  She states he does a taper dosage.   Wife aware will have defer to Dr Ellyn Hack and contact  once a answer is given. Wive voiced understanding.

## 2021-09-20 NOTE — Telephone Encounter (Signed)
°*  STAT* If patient is at the pharmacy, call can be transferred to refill team.   1. Which medications need to be refilled? (please list name of each medication and dose if known)  predniSONE (DELTASONE) 10 MG tablet  2. Which pharmacy/location (including street and city if local pharmacy) is medication to be sent to? [facility]  3. Do they need a 30 day or 90 day supply? 30 with refills   Wife states that Dr. Ellyn Hack was supposed to send an rx to the patient's pharmacy on Friday but it did not get sent over

## 2021-09-21 DIAGNOSIS — D225 Melanocytic nevi of trunk: Secondary | ICD-10-CM | POA: Diagnosis not present

## 2021-09-21 DIAGNOSIS — Z85828 Personal history of other malignant neoplasm of skin: Secondary | ICD-10-CM | POA: Diagnosis not present

## 2021-09-21 DIAGNOSIS — Z85820 Personal history of malignant melanoma of skin: Secondary | ICD-10-CM | POA: Diagnosis not present

## 2021-09-21 DIAGNOSIS — L814 Other melanin hyperpigmentation: Secondary | ICD-10-CM | POA: Diagnosis not present

## 2021-09-27 DIAGNOSIS — M25561 Pain in right knee: Secondary | ICD-10-CM | POA: Diagnosis not present

## 2021-09-27 DIAGNOSIS — Z96653 Presence of artificial knee joint, bilateral: Secondary | ICD-10-CM | POA: Diagnosis not present

## 2021-09-27 DIAGNOSIS — M25661 Stiffness of right knee, not elsewhere classified: Secondary | ICD-10-CM | POA: Diagnosis not present

## 2021-09-27 DIAGNOSIS — M25562 Pain in left knee: Secondary | ICD-10-CM | POA: Diagnosis not present

## 2021-09-28 MED ORDER — PREDNISONE 10 MG PO TABS
ORAL_TABLET | ORAL | 0 refills | Status: DC
Start: 2021-09-28 — End: 2022-03-07

## 2021-09-28 NOTE — Addendum Note (Signed)
Addended by: Raiford Simmonds on: 09/28/2021 05:57 PM ? ? Modules accepted: Orders ? ?

## 2021-09-28 NOTE — Telephone Encounter (Signed)
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 ?Would usually prefer to have PCP order, but I do remember agreeing to refill since they have been having issues getting rx from pcp.  ? ?Glenetta Hew   ? ?

## 2021-09-28 NOTE — Telephone Encounter (Signed)
Wife aware  Dr Ellyn Hack  prescribed prednisone 10 mg  for patient. ? Prescription was  sent to t the pharmacy  ?

## 2021-10-02 DIAGNOSIS — M25561 Pain in right knee: Secondary | ICD-10-CM | POA: Diagnosis not present

## 2021-10-02 DIAGNOSIS — M25562 Pain in left knee: Secondary | ICD-10-CM | POA: Diagnosis not present

## 2021-10-02 DIAGNOSIS — M25661 Stiffness of right knee, not elsewhere classified: Secondary | ICD-10-CM | POA: Diagnosis not present

## 2021-10-02 DIAGNOSIS — Z96653 Presence of artificial knee joint, bilateral: Secondary | ICD-10-CM | POA: Diagnosis not present

## 2021-10-05 DIAGNOSIS — M25561 Pain in right knee: Secondary | ICD-10-CM | POA: Diagnosis not present

## 2021-10-05 DIAGNOSIS — M25562 Pain in left knee: Secondary | ICD-10-CM | POA: Diagnosis not present

## 2021-10-15 DIAGNOSIS — H353221 Exudative age-related macular degeneration, left eye, with active choroidal neovascularization: Secondary | ICD-10-CM | POA: Diagnosis not present

## 2021-10-18 DIAGNOSIS — M25562 Pain in left knee: Secondary | ICD-10-CM | POA: Diagnosis not present

## 2021-10-18 DIAGNOSIS — M25661 Stiffness of right knee, not elsewhere classified: Secondary | ICD-10-CM | POA: Diagnosis not present

## 2021-10-18 DIAGNOSIS — Z96653 Presence of artificial knee joint, bilateral: Secondary | ICD-10-CM | POA: Diagnosis not present

## 2021-10-18 DIAGNOSIS — M25561 Pain in right knee: Secondary | ICD-10-CM | POA: Diagnosis not present

## 2021-10-23 ENCOUNTER — Encounter (HOSPITAL_COMMUNITY)
Admission: RE | Admit: 2021-10-23 | Discharge: 2021-10-23 | Disposition: A | Discharge status: Home / Self Care | Payer: Medicare Other | Source: Ambulatory Visit | Attending: Family Medicine | Admitting: Family Medicine

## 2021-10-23 DIAGNOSIS — Z452 Encounter for adjustment and management of vascular access device: Secondary | ICD-10-CM | POA: Diagnosis not present

## 2021-10-23 MED ORDER — HEPARIN SOD (PORK) LOCK FLUSH 100 UNIT/ML IV SOLN
INTRAVENOUS | Status: AC
  Filled 2021-10-23: qty 5

## 2021-10-23 MED ORDER — SODIUM CHLORIDE 0.9% FLUSH: 10.0000 mL | INTRAVENOUS | Status: DC | PRN

## 2021-10-23 MED ORDER — HEPARIN SOD (PORK) LOCK FLUSH 100 UNIT/ML IV SOLN: 500.0000 [IU] | INTRAVENOUS | Status: DC | PRN

## 2021-10-23 NOTE — Progress Notes (Signed)
Unable to flush port. Port was accessed by two different nurses and both times the port would not flush or return blood. It appeared that the saline was being pushed back out when trying to flush the port. Patient advised to call his primary doctor to let him know about his port.  ?

## 2021-10-24 DIAGNOSIS — H353211 Exudative age-related macular degeneration, right eye, with active choroidal neovascularization: Secondary | ICD-10-CM | POA: Diagnosis not present

## 2021-11-08 DIAGNOSIS — B351 Tinea unguium: Secondary | ICD-10-CM | POA: Diagnosis not present

## 2021-11-08 DIAGNOSIS — E1142 Type 2 diabetes mellitus with diabetic polyneuropathy: Secondary | ICD-10-CM | POA: Diagnosis not present

## 2021-11-12 DIAGNOSIS — H353221 Exudative age-related macular degeneration, left eye, with active choroidal neovascularization: Secondary | ICD-10-CM | POA: Diagnosis not present

## 2021-12-10 DIAGNOSIS — H35453 Secondary pigmentary degeneration, bilateral: Secondary | ICD-10-CM | POA: Diagnosis not present

## 2021-12-10 DIAGNOSIS — H35363 Drusen (degenerative) of macula, bilateral: Secondary | ICD-10-CM | POA: Diagnosis not present

## 2021-12-10 DIAGNOSIS — H3561 Retinal hemorrhage, right eye: Secondary | ICD-10-CM | POA: Diagnosis not present

## 2021-12-10 DIAGNOSIS — H353231 Exudative age-related macular degeneration, bilateral, with active choroidal neovascularization: Secondary | ICD-10-CM | POA: Diagnosis not present

## 2021-12-12 DIAGNOSIS — H353221 Exudative age-related macular degeneration, left eye, with active choroidal neovascularization: Secondary | ICD-10-CM | POA: Diagnosis not present

## 2021-12-17 DIAGNOSIS — H353211 Exudative age-related macular degeneration, right eye, with active choroidal neovascularization: Secondary | ICD-10-CM | POA: Diagnosis not present

## 2022-01-16 DIAGNOSIS — F411 Generalized anxiety disorder: Secondary | ICD-10-CM | POA: Diagnosis not present

## 2022-01-16 DIAGNOSIS — E782 Mixed hyperlipidemia: Secondary | ICD-10-CM | POA: Diagnosis not present

## 2022-01-16 DIAGNOSIS — N390 Urinary tract infection, site not specified: Secondary | ICD-10-CM | POA: Diagnosis not present

## 2022-01-17 DIAGNOSIS — H353221 Exudative age-related macular degeneration, left eye, with active choroidal neovascularization: Secondary | ICD-10-CM | POA: Diagnosis not present

## 2022-01-21 DIAGNOSIS — H353211 Exudative age-related macular degeneration, right eye, with active choroidal neovascularization: Secondary | ICD-10-CM | POA: Diagnosis not present

## 2022-01-24 DIAGNOSIS — B351 Tinea unguium: Secondary | ICD-10-CM | POA: Diagnosis not present

## 2022-01-24 DIAGNOSIS — M79672 Pain in left foot: Secondary | ICD-10-CM | POA: Diagnosis not present

## 2022-01-24 DIAGNOSIS — M79671 Pain in right foot: Secondary | ICD-10-CM | POA: Diagnosis not present

## 2022-01-24 DIAGNOSIS — E1142 Type 2 diabetes mellitus with diabetic polyneuropathy: Secondary | ICD-10-CM | POA: Diagnosis not present

## 2022-02-05 DIAGNOSIS — Z0000 Encounter for general adult medical examination without abnormal findings: Secondary | ICD-10-CM | POA: Diagnosis not present

## 2022-02-15 ENCOUNTER — Telehealth (INDEPENDENT_AMBULATORY_CARE_PROVIDER_SITE_OTHER): Payer: Self-pay

## 2022-02-15 ENCOUNTER — Other Ambulatory Visit (INDEPENDENT_AMBULATORY_CARE_PROVIDER_SITE_OTHER): Payer: Self-pay

## 2022-02-15 ENCOUNTER — Encounter (INDEPENDENT_AMBULATORY_CARE_PROVIDER_SITE_OTHER): Payer: Self-pay

## 2022-02-15 DIAGNOSIS — Z86010 Personal history of colonic polyps: Secondary | ICD-10-CM

## 2022-02-15 MED ORDER — NA SULFATE-K SULFATE-MG SULF 17.5-3.13-1.6 GM/177ML PO SOLN: 1.0000 | Freq: Once | ORAL | 0 refills | Status: AC

## 2022-02-15 NOTE — Telephone Encounter (Signed)
Referring MD/PCP: Fusco  Procedure: tcs   Reason/Indication:  hx of colon polyps  Has patient had this procedure before?  Yes 05/28/19  If so, when, by whom and where?    Is there a family history of colon cancer?  No   Who?  What age when diagnosed?    Is patient diabetic? If yes, Type 1 or Type 2   type 2 controlled       Does patient have prosthetic heart valve or mechanical valve?  no  Do you have a pacemaker/defibrillator?  no  Has patient ever had endocarditis/atrial fibrillation? no  Does patient use oxygen? no  Has patient had joint replacement within last 12 months?  no  Is patient constipated or do they take laxatives? Occasional   Does patient have a history of alcohol/drug use?  Alcohol occasional   Have you had a stroke/heart attack last 6 mths? no  Do you take medicine for weight loss?  no  For male patients,: have you had a hysterectomy n/a                      are you post menopausal n/a                      do you still have your menstrual cycle n/a  Is patient on blood thinner such as Coumadin, Plavix and/or Aspirin? No   Medications: SEE EPIC  Allergies: ASA, Rosuvastatin   Medication Adjustment per /Dr Jenetta Downer none  Procedure date & time: 03/15/22 at 230

## 2022-02-15 NOTE — Telephone Encounter (Signed)
Arthur Lutz Arthur Lutz, CMA  ?

## 2022-02-18 DIAGNOSIS — H353231 Exudative age-related macular degeneration, bilateral, with active choroidal neovascularization: Secondary | ICD-10-CM | POA: Diagnosis not present

## 2022-02-18 DIAGNOSIS — H35363 Drusen (degenerative) of macula, bilateral: Secondary | ICD-10-CM | POA: Diagnosis not present

## 2022-02-18 DIAGNOSIS — Q142 Congenital malformation of optic disc: Secondary | ICD-10-CM | POA: Diagnosis not present

## 2022-02-18 DIAGNOSIS — H43392 Other vitreous opacities, left eye: Secondary | ICD-10-CM | POA: Diagnosis not present

## 2022-02-20 DIAGNOSIS — H353221 Exudative age-related macular degeneration, left eye, with active choroidal neovascularization: Secondary | ICD-10-CM | POA: Diagnosis not present

## 2022-02-27 DIAGNOSIS — H353211 Exudative age-related macular degeneration, right eye, with active choroidal neovascularization: Secondary | ICD-10-CM | POA: Diagnosis not present

## 2022-03-06 DIAGNOSIS — H353230 Exudative age-related macular degeneration, bilateral, stage unspecified: Secondary | ICD-10-CM | POA: Diagnosis not present

## 2022-03-07 DIAGNOSIS — L821 Other seborrheic keratosis: Secondary | ICD-10-CM | POA: Diagnosis not present

## 2022-03-07 DIAGNOSIS — L820 Inflamed seborrheic keratosis: Secondary | ICD-10-CM | POA: Diagnosis not present

## 2022-03-07 DIAGNOSIS — D1730 Benign lipomatous neoplasm of skin and subcutaneous tissue of unspecified sites: Secondary | ICD-10-CM | POA: Diagnosis not present

## 2022-03-07 DIAGNOSIS — D1801 Hemangioma of skin and subcutaneous tissue: Secondary | ICD-10-CM | POA: Diagnosis not present

## 2022-03-09 ENCOUNTER — Other Ambulatory Visit: Payer: Self-pay | Admitting: Cardiology

## 2022-03-12 ENCOUNTER — Encounter (HOSPITAL_COMMUNITY)
Admission: RE | Admit: 2022-03-12 | Discharge: 2022-03-12 | Disposition: A | Discharge status: Home / Self Care | Payer: Medicare Other | Source: Ambulatory Visit | Attending: Gastroenterology | Admitting: Gastroenterology

## 2022-03-12 DIAGNOSIS — H35363 Drusen (degenerative) of macula, bilateral: Secondary | ICD-10-CM | POA: Diagnosis not present

## 2022-03-12 DIAGNOSIS — H353231 Exudative age-related macular degeneration, bilateral, with active choroidal neovascularization: Secondary | ICD-10-CM | POA: Diagnosis not present

## 2022-03-12 DIAGNOSIS — H35371 Puckering of macula, right eye: Secondary | ICD-10-CM | POA: Diagnosis not present

## 2022-03-12 DIAGNOSIS — H35453 Secondary pigmentary degeneration, bilateral: Secondary | ICD-10-CM | POA: Diagnosis not present

## 2022-03-15 ENCOUNTER — Ambulatory Visit (HOSPITAL_COMMUNITY): Payer: Medicare Other | Admitting: Anesthesiology

## 2022-03-15 ENCOUNTER — Encounter (HOSPITAL_COMMUNITY)
Admission: RE | Disposition: A | Discharge status: Home / Self Care | Payer: Self-pay | Source: Home / Self Care | Attending: Gastroenterology

## 2022-03-15 ENCOUNTER — Ambulatory Visit (HOSPITAL_BASED_OUTPATIENT_CLINIC_OR_DEPARTMENT_OTHER): Payer: Medicare Other | Admitting: Anesthesiology

## 2022-03-15 ENCOUNTER — Ambulatory Visit (HOSPITAL_COMMUNITY)
Admission: RE | Admit: 2022-03-15 | Discharge: 2022-03-15 | Disposition: A | Discharge status: Home / Self Care | Payer: Medicare Other | Attending: Gastroenterology | Admitting: Gastroenterology

## 2022-03-15 DIAGNOSIS — I10 Essential (primary) hypertension: Secondary | ICD-10-CM | POA: Insufficient documentation

## 2022-03-15 DIAGNOSIS — Z86010 Personal history of colonic polyps: Secondary | ICD-10-CM

## 2022-03-15 DIAGNOSIS — E785 Hyperlipidemia, unspecified: Secondary | ICD-10-CM | POA: Diagnosis not present

## 2022-03-15 DIAGNOSIS — K635 Polyp of colon: Secondary | ICD-10-CM

## 2022-03-15 DIAGNOSIS — Z1211 Encounter for screening for malignant neoplasm of colon: Secondary | ICD-10-CM | POA: Diagnosis not present

## 2022-03-15 DIAGNOSIS — I25119 Atherosclerotic heart disease of native coronary artery with unspecified angina pectoris: Secondary | ICD-10-CM | POA: Diagnosis not present

## 2022-03-15 DIAGNOSIS — Z955 Presence of coronary angioplasty implant and graft: Secondary | ICD-10-CM | POA: Insufficient documentation

## 2022-03-15 DIAGNOSIS — K5730 Diverticulosis of large intestine without perforation or abscess without bleeding: Secondary | ICD-10-CM

## 2022-03-15 DIAGNOSIS — I2510 Atherosclerotic heart disease of native coronary artery without angina pectoris: Secondary | ICD-10-CM | POA: Diagnosis not present

## 2022-03-15 DIAGNOSIS — Z8551 Personal history of malignant neoplasm of bladder: Secondary | ICD-10-CM | POA: Diagnosis not present

## 2022-03-15 DIAGNOSIS — K648 Other hemorrhoids: Secondary | ICD-10-CM

## 2022-03-15 DIAGNOSIS — Z7984 Long term (current) use of oral hypoglycemic drugs: Secondary | ICD-10-CM | POA: Diagnosis not present

## 2022-03-15 DIAGNOSIS — Z09 Encounter for follow-up examination after completed treatment for conditions other than malignant neoplasm: Secondary | ICD-10-CM | POA: Diagnosis not present

## 2022-03-15 DIAGNOSIS — E119 Type 2 diabetes mellitus without complications: Secondary | ICD-10-CM | POA: Diagnosis not present

## 2022-03-15 DIAGNOSIS — Z85820 Personal history of malignant melanoma of skin: Secondary | ICD-10-CM | POA: Insufficient documentation

## 2022-03-15 DIAGNOSIS — I252 Old myocardial infarction: Secondary | ICD-10-CM | POA: Diagnosis not present

## 2022-03-15 DIAGNOSIS — Z87891 Personal history of nicotine dependence: Secondary | ICD-10-CM | POA: Diagnosis not present

## 2022-03-15 DIAGNOSIS — Z8673 Personal history of transient ischemic attack (TIA), and cerebral infarction without residual deficits: Secondary | ICD-10-CM | POA: Insufficient documentation

## 2022-03-15 DIAGNOSIS — D122 Benign neoplasm of ascending colon: Secondary | ICD-10-CM | POA: Insufficient documentation

## 2022-03-15 HISTORY — PX: COLONOSCOPY WITH PROPOFOL: SHX5780

## 2022-03-15 HISTORY — PX: POLYPECTOMY: SHX149

## 2022-03-15 LAB — HM COLONOSCOPY

## 2022-03-15 SURGERY — COLONOSCOPY WITH PROPOFOL
Anesthesia: General

## 2022-03-15 MED ORDER — LIDOCAINE HCL (CARDIAC) PF 100 MG/5ML IV SOSY
PREFILLED_SYRINGE | INTRAVENOUS | Status: DC | PRN
  Administered 2022-03-15: 60 mg via INTRATRACHEAL

## 2022-03-15 MED ORDER — LACTATED RINGERS IV SOLN: INTRAVENOUS | Status: DC

## 2022-03-15 MED ORDER — LACTATED RINGERS IV SOLN: INTRAVENOUS | Status: DC | PRN

## 2022-03-15 MED ORDER — PROPOFOL 500 MG/50ML IV EMUL
INTRAVENOUS | Status: DC | PRN
  Administered 2022-03-15: 150 ug/kg/min via INTRAVENOUS

## 2022-03-15 MED ORDER — PROPOFOL 10 MG/ML IV BOLUS
INTRAVENOUS | Status: DC | PRN
  Administered 2022-03-15: 100 mg via INTRAVENOUS

## 2022-03-15 NOTE — Anesthesia Preprocedure Evaluation (Signed)
Anesthesia Evaluation  Patient identified by MRN, date of birth, ID band Patient awake    Reviewed: Allergy & Precautions, NPO status , Patient's Chart, lab work & pertinent test results  Airway Mallampati: II  TM Distance: >3 FB Neck ROM: Full    Dental  (+) Dental Advisory Given, Missing, Partial Upper   Pulmonary neg pulmonary ROS, former smoker,    Pulmonary exam normal breath sounds clear to auscultation       Cardiovascular Exercise Tolerance: Good hypertension, Pt. on medications + angina + CAD, + Past MI, + Cardiac Stents and + DOE  + Valvular Problems/Murmurs AS  Rhythm:Regular Rate:Bradycardia + Systolic murmurs    Neuro/Psych  Neuromuscular disease CVA negative psych ROS   GI/Hepatic negative GI ROS, Neg liver ROS,   Endo/Other  diabetes, Well Controlled, Type 2, Oral Hypoglycemic Agents  Renal/GU Renal disease  negative genitourinary   Musculoskeletal  (+) Arthritis , Osteoarthritis,    Abdominal   Peds negative pediatric ROS (+)  Hematology  (+) Blood dyscrasia, anemia ,   Anesthesia Other Findings   Reproductive/Obstetrics negative OB ROS                            Anesthesia Physical Anesthesia Plan  ASA: 3  Anesthesia Plan: General   Post-op Pain Management: Minimal or no pain anticipated   Induction: Intravenous  PONV Risk Score and Plan: Propofol infusion  Airway Management Planned: Nasal Cannula and Natural Airway  Additional Equipment:   Intra-op Plan:   Post-operative Plan:   Informed Consent: I have reviewed the patients History and Physical, chart, labs and discussed the procedure including the risks, benefits and alternatives for the proposed anesthesia with the patient or authorized representative who has indicated his/her understanding and acceptance.     Dental advisory given  Plan Discussed with: CRNA and Surgeon  Anesthesia Plan Comments:          Anesthesia Quick Evaluation

## 2022-03-15 NOTE — Transfer of Care (Signed)
Immediate Anesthesia Transfer of Care Note  Patient: Arthur Lutz  Procedure(s) Performed: COLONOSCOPY WITH PROPOFOL POLYPECTOMY INTESTINAL  Patient Location: Short Stay  Anesthesia Type:General  Level of Consciousness: sedated  Airway & Oxygen Therapy: Patient Spontanous Breathing  Post-op Assessment: Report given to RN and Post -op Vital signs reviewed and stable  Post vital signs: Reviewed and stable  Last Vitals:  Vitals Value Taken Time  BP 134/63 03/15/22 1415  Temp 36.5 C 03/15/22 1415  Pulse 77 03/15/22 1415  Resp    SpO2 93 % 03/15/22 1415    Last Pain:  Vitals:   03/15/22 1415  TempSrc: Oral  PainSc: 0-No pain         Complications: No notable events documented.

## 2022-03-15 NOTE — H&P (Signed)
Arthur Lutz is an 81 y.o. male.   Chief Complaint: history of colon polyps HPI: 81 year old male with past medical history of hypertension, MI, stroke, coronary artery disease status post stent placement, bladder cancer, macular degeneration, melanoma, diabetes and hyperlipidemia, coming for history of colon polyps.  Last colonoscopy was performed in 05/28/2019, was found to have 2 sessile serrated lesions and one tubular adenoma.  He was advised to repeat a colonoscopy in 1 year.  The patient denies having any nausea, vomiting, fever, chills, hematochezia, melena, hematemesis, abdominal distention, abdominal pain, diarrhea, jaundice, pruritus or weight loss.  No family history of colon cancer.  Past Medical History:  Diagnosis Date   Allergic rhinitis    Aortic valve sclerosis    Cause of murmur   Arthritis of knee, left, needs total Knee in future with Dr. Wynelle Link  08/07/2011   Bladder cancer ([place]) 11/2005   CAD S/P percutaneous coronary angioplasty 08/07/2011   NEW 08/07/11 cutting balloon PTCA to 95% ostial diag. and 90%  proximal LCX.  prior cutting balloon and to RCA  & ostium of ostial diag; Last Cath 02/2012 - Patent Diag PTCA, RCA & Circ BMS stents. Normal EF & EDP; Myoview 2/14 no evidence of ischemia or infarction   Diabetes mellitus type 2, controlled, with complications ([place])    Diabetes mellitus without complication ([place])    Dyslipidemia    History of heart attack 11/2007   "mild; did not affect the heart muscle"; PCI to RCA, Cutting PTCA Diag ostium   Hypertension    Macular degeneration    Macular degeneration of right eye    Now getting shots.    Melanoma of skin ([place]) ~ 2005   "level 2; on my back"   Myocardial infarction [facility]) 2012   Osteoarthritis    Status post right knee arthroplasty, positive for staph infection. Pending right arthroplasty   Portacath in place 08/11/2012   Statin intolerance 08/07/2011   Stroke ([place]) 1980's   "had facial strokes; 2; about a year  apart; never did find what caused them"    Past Surgical History:  Procedure Laterality Date   7-day ZIO PATCH MONITOR  06/2021   Predominant rhythm-sinus w/ bundle branch block/IVCD noted.  HR range 35-136 bpm.  Average 67 bpm. HR 35 bpm @ 1AM. 19 atrial runs: Fastest and longest interval was 27.5 seconds (60 beats) with an average heart rate of 132 bpm, max 54 bpm =>occasionally w/ aberrancy & NOT not noted on diary.  Occasional isolated PACs.  No sustained tachy or bradycardic arrhythmia.  No chronotropic incompetence.   BIOPSY  05/28/2019   Procedure: BIOPSY;  Surgeon: Rogene Houston, MD;  Location: [facility];  Service: Endoscopy;;  cecum   CATARACT EXTRACTION W/ INTRAOCULAR LENS IMPLANT  ~ 2010   right   CATARACT EXTRACTION W/PHACO Left 11/21/2016   Procedure: CATARACT EXTRACTION PHACO AND INTRAOCULAR LENS PLACEMENT ([place]) CDE - 11.70 ;  Surgeon: Tonny Branch, MD;  Location: [facility] ORS;  Service: Ophthalmology;  Laterality: Left;  left   cold cup removal bladder lesion  11/2005   "malignant"   COLONOSCOPY N/A 12/09/2014   Procedure: COLONOSCOPY;  Surgeon: Rogene Houston, MD;  Location: [facility];  Service: Endoscopy;  Laterality: N/A;  955 - moved to 8:30 - Ann to notify pt   COLONOSCOPY WITH PROPOFOL N/A 05/28/2019   Procedure: COLONOSCOPY WITH PROPOFOL;  Surgeon: Rogene Houston, MD;  [facility];  Service: Endoscopy;  Laterality: N/A;  CORONARY ANGIOPLASTY WITH STENT PLACEMENT  11/2007   Mid RCA - Driver BMS 3.5 mm x 15 mm; 2.0 mm Cutting Balloon PTCA - ostial D1   CORONARY ANGIOPLASTY WITH STENT PLACEMENT  08/07/2011   Abnormal TM Myoview - for exertional throat discomfort: Patent RCA stent, 90% circumflex -- Vision BMS 3.5 mm x 12 mm --> 4.2 mm. Ostial D2 95% -- 2.25 mm Cutting Balloon PTCA   DOPPLER ECHOCARDIOGRAPHY  11/2007   Normal EF, impaired relaxation, with sclerotic aortic valve. No stenosis.   INGUINAL HERNIA REPAIR  ~ 1970   left   KNEE ARTHROSCOPY   02/20/2011   left   LEFT HEART CATHETERIZATION WITH CORONARY ANGIOGRAM N/A 08/07/2011   Procedure: LEFT HEART CATHETERIZATION WITH CORONARY ANGIOGRAM;  Surgeon: Leonie Man, MD;  Location: [facility] CATH LAB;  Service: Cardiovascular;  Laterality: N/A;   LEFT HEART CATHETERIZATION WITH CORONARY ANGIOGRAM N/A 03/06/2012   Procedure: LEFT HEART CATHETERIZATION WITH CORONARY ANGIOGRAM;  Surgeon: Leonie Man, MD;  Location: [facility] CATH LAB;  Service: Cardiovascular;;Widely patent RCA and LCx stents. Also patent PTCA site to D1, ~40-50% D2. Normal EF, normal EDP   NM MYOVIEW LTD  08/29/2021   a) 07/2012: Low risk, EF 49%, improved from prior. Small apical and apical septal defect, fixed likely artifact no skin or infarction.; b)  08/2021: LOW RISK.  EF 60 to 65% (61%).  No evidence of ischemia or infarction.  Small apical defect likely related to apical thinning.   PARTIAL KNEE ARTHROPLASTY  06/2003   right   PERCUTANEOUS CORONARY STENT INTERVENTION (PCI-S)  08/07/2011   Procedure: PERCUTANEOUS CORONARY STENT INTERVENTION (PCI-S);  Surgeon: Leonie Man, MD;  Location: [facility] CATH LAB;  Service: Cardiovascular;;   POLYPECTOMY  05/28/2019   Procedure: POLYPECTOMY;  Surgeon: Rogene Houston, MD;  [facility];  Service: Endoscopy;;  colon   TONSILLECTOMY     "as a child"   TOTAL KNEE ARTHROPLASTY  11/2003   right x2 and one on left- Total of 3.   TOTAL KNEE ARTHROPLASTY Left 09/28/2012   Procedure: TOTAL KNEE ARTHROPLASTY;  Surgeon: Gearlean Alf, MD;  Location: [facility] ORS;  Service: Orthopedics;  Laterality: Left;   TRANSTHORACIC ECHOCARDIOGRAM  05/14/2021   EF 55 to 60%.  No R WMA.  GR 1 DD.   Mild LA dilation. Normal PAP and RAP.  Essentially normal.    Family History  Problem Relation Age of Onset   Emphysema Father    Lung cancer Paternal Uncle        x 2 smokers   Colon cancer Neg Hx    Social History:  reports that he quit smoking about 41 years ago. His smoking use included  cigarettes. He has a 56.00 pack-year smoking history. He has never used smokeless tobacco. He reports current alcohol use of about 2.0 standard drinks of alcohol per week. He reports that he does not use drugs.  Allergies:  Allergies  Allergen Reactions   Aspirin Other (See Comments)    Was told to not take this due to having macular degeneration   Rosuvastatin Calcium Other (See Comments)    Aches and cramps   Other Rash and Other (See Comments)    Red meat = develops a rash on the back    Medications Prior to Admission  Medication Sig Dispense Refill   acetaminophen (TYLENOL) 650 MG CR tablet Take 1,300 mg by mouth every 8 (eight) hours as needed for pain.  amLODipine (NORVASC) 10 MG tablet Take 10 mg by mouth daily.     azelastine (ASTELIN) 0.1 % nasal spray Place 1 spray into the nose daily as needed (allergies).     clobetasol (TEMOVATE) 0.05 % external solution Apply 1 application topically daily as needed (irritation).     Coenzyme Q10 (COQ10) 100 MG CAPS Take 100 mg by mouth daily.     EPINEPHrine 0.3 mg/0.3 mL IJ SOAJ injection Inject 0.3 mg into the muscle as needed for anaphylaxis.     ezetimibe (ZETIA) 10 MG tablet Take 10 mg by mouth daily.     gabapentin (NEURONTIN) 100 MG capsule Take 100 mg by mouth at bedtime as needed (pain).     hydrALAZINE (APRESOLINE) 50 MG tablet Take 50 mg by mouth 3 (three) times daily.     isosorbide mononitrate (IMDUR) 60 MG 24 hr tablet Take 60 mg by mouth daily.     lisinopril (ZESTRIL) 40 MG tablet TAKE 1/2 TABLET BY MOUTH ONCE DAILY. 45 tablet 0   LOTEMAX 0.5 % OINT Place 1 application into the left eye daily as needed (irritation, dry eyes).     magnesium gluconate (MAGONATE) 500 MG tablet Take 500 mg by mouth daily.     meloxicam (MOBIC) 15 MG tablet Take 15 mg by mouth daily as needed for pain.     Naltrexone HCl, Pain, (NALTREX) 4.5 MG CAPS Take 4.5 mg by mouth every evening.     naproxen sodium (ALEVE) 220 MG tablet Take 440 mg by  mouth 2 (two) times daily as needed (pain).     OVER THE COUNTER MEDICATION Take 2 Capfuls by mouth daily. HistaQuel (Histamine)     OVER THE COUNTER MEDICATION Take 1 capsule by mouth daily. Blood Sugar Formula     prednisoLONE acetate (PRED FORTE) 1 % ophthalmic suspension Place 1 drop into both eyes 2 (two) times daily as needed (irritation).     simvastatin (ZOCOR) 20 MG tablet Take 20 mg by mouth every evening.     tobramycin (TOBREX) 0.3 % ophthalmic solution Place 1 drop into both eyes daily as needed (before, during and after eye injection).     triazolam (HALCION) 0.125 MG tablet Take 1.5 tablets (0.1875 mg total) by mouth at bedtime. Pt takes 1.5 tabs at bedtime (Patient taking differently: Take 0.25 mg by mouth at bedtime. Pt takes 2 tabs at bedtime) 30 tablet 5   loperamide (IMODIUM) 2 MG capsule Take 1 capsule (2 mg total) by mouth 4 (four) times daily as needed for diarrhea or loose stools. 12 capsule 0    No results found for this or any previous visit (from the past 48 hour(s)). No results found.  Review of Systems  All other systems reviewed and are negative.   Blood pressure (!) 161/65, pulse (!) 49, temperature 97.8 F (36.6 C), resp. rate 14, SpO2 92 %. Physical Exam  GENERAL: The patient is AO x3, in no acute distress. HEENT: Head is normocephalic and atraumatic. EOMI are intact. Mouth is well hydrated and without lesions. NECK: Supple. No masses LUNGS: Clear to auscultation. No presence of rhonchi/wheezing/rales. Adequate chest expansion HEART: RRR, normal s1 and s2. ABDOMEN: Soft, nontender, no guarding, no peritoneal signs, and nondistended. BS +. No masses. EXTREMITIES: Without any cyanosis, clubbing, rash, lesions or edema. NEUROLOGIC: AOx3, no focal motor deficit. SKIN: no jaundice, no rashes  Assessment/Plan 82 year old male with past medical history of hypertension, MI, stroke, coronary artery disease status post stent placement, bladder cancer, macular  degeneration, melanoma, diabetes and hyperlipidemia, coming for history of colon polyps.  We will proceed with colonoscopy.  Harvel Quale, MD 03/15/2022, 1:12 PM

## 2022-03-15 NOTE — Anesthesia Postprocedure Evaluation (Signed)
Anesthesia Post Note  Patient: Arthur Lutz  Procedure(s) Performed: COLONOSCOPY WITH PROPOFOL POLYPECTOMY INTESTINAL  Patient location during evaluation: Phase II Anesthesia Type: General Level of consciousness: awake and alert and oriented Pain management: pain level controlled Vital Signs Assessment: post-procedure vital signs reviewed and stable Respiratory status: spontaneous breathing, nonlabored ventilation and respiratory function stable Cardiovascular status: blood pressure returned to baseline and stable Postop Assessment: no apparent nausea or vomiting Anesthetic complications: no   No notable events documented.   Last Vitals:  Vitals:   03/15/22 1245 03/15/22 1415  BP: (!) 161/65 134/63  Pulse: (!) 49 77  Resp: 14   Temp: 36.6 C 36.5 C  SpO2: 92% 93%    Last Pain:  Vitals:   03/15/22 1415  TempSrc: Oral  PainSc: 0-No pain                 Sache Sane C Moorea Boissonneault

## 2022-03-15 NOTE — Op Note (Addendum)
[facility] Patient Name: Arthur Lutz Procedure Date: 03/15/2022 1:16 PM MRN: [id number] Date of Birth: 1940/11/30 Attending MD: Maylon Peppers ,  CSN: [id number] Age: 81 Admit Type: Outpatient Procedure:                Colonoscopy Indications:              Surveillance: Personal history of adenomatous                            polyps on last colonoscopy 3 years ago Providers:                Maylon Peppers, Crystal Page, Raphael Gibney,                            Technician Referring MD:              Medicines:                Monitored Anesthesia Care Complications:            No immediate complications. Estimated Blood Loss:     Estimated blood loss: none. Procedure:                Pre-Anesthesia Assessment:                           - Prior to the procedure, a History and Physical                            was performed, and patient medications, allergies                            and sensitivities were reviewed. The patient's                            tolerance of previous anesthesia was reviewed.                           - The risks and benefits of the procedure and the                            sedation options and risks were discussed with the                            patient. All questions were answered and informed                            consent was obtained.                           After obtaining informed consent, the colonoscope                            was passed under direct vision. Throughout the                            procedure, the patient's blood pressure, pulse, and  oxygen saturations were monitored continuously. The                            [id number]-HQ190L ([id number]) scope was introduced through                            the anus and advanced to the the cecum, identified                            by appendiceal orifice and ileocecal valve. The                            colonoscopy was performed with difficulty  due to a                            redundant colon and significant looping. Successful                            completion of the procedure was aided by applying                            abdominal pressure in the epigastric and RLQ area                            intermittently. The patient tolerated the procedure                            well. The quality of the bowel preparation was                            adequate to identify polyps 6 mm and larger in size. Scope In: 1:24:10 PM Scope Out: 2:09:10 PM Scope Withdrawal Time: 0 hours 24 minutes 1 second  Total Procedure Duration: 0 hours 45 minutes 0 seconds  Findings:      The perianal and digital rectal examinations were normal.      The ascending colon revealed significantly excessive looping. Successful       completion of the procedure was aided by applying abdominal pressure in       the epigastric and RLQ area intermittently.      A 2 mm polyp was found in the ascending colon. The polyp was sessile.       The polyp was removed with a cold biopsy forceps. Resection and       retrieval were complete.      Two sessile polyps were found in the transverse colon and ascending       colon. The polyps were 4 to 5 mm in size. These polyps were removed with       a cold snare. Resection was complete, but the polyp tissue was not       retrieved.      A few small-mouthed diverticula were found in the sigmoid colon.      Non-bleeding internal hemorrhoids were found during retroflexion. The       hemorrhoids were small. Impression:               - There was significant  looping of the colon.                           - One 2 mm polyp in the ascending colon, removed                            with a cold biopsy forceps. Resected and retrieved.                           - Two 4 to 5 mm polyps in the transverse colon and                            in the ascending colon, removed with a cold snare.                            Complete  resection. Polyp tissue not retrieved.                           - Diverticulosis in the sigmoid colon.                           - Non-bleeding internal hemorrhoids. Moderate Sedation:      Per Anesthesia Care Recommendation:           - Discharge patient to home (ambulatory).                           - Resume previous diet.                           - Await pathology results.                           - Repeat colonoscopy in 5 years for screening                            purposes. Procedure Code(s):        --- Professional ---                           [phone], Colonoscopy, flexible; with removal of                            tumor(s), polyp(s), or other lesion(s) by snare                            technique                           30092, 58, Colonoscopy, flexible; with biopsy,                            single or multiple Diagnosis Code(s):        --- Professional ---  K63.5, Polyp of colon                           Z86.010, Personal history of colonic polyps                           K64.8, Other hemorrhoids                           K57.30, Diverticulosis of large intestine without                            perforation or abscess without bleeding CPT copyright 2019 American Medical Association. All rights reserved. The codes documented in this report are preliminary and upon coder review may  be revised to meet current compliance requirements. Maylon Peppers, MD Maylon Peppers,  03/15/2022 2:17:10 PM This report has been signed electronically. Number of Addenda: 0

## 2022-03-15 NOTE — Discharge Instructions (Signed)
You are being discharged to home.  Resume your previous diet.  We are waiting for your pathology results.  Your physician has indicated that a repeat colonoscopy is not recommended due to your current age (66 years or older) for screening purposes.  

## 2022-03-18 ENCOUNTER — Encounter (INDEPENDENT_AMBULATORY_CARE_PROVIDER_SITE_OTHER): Payer: Self-pay | Admitting: *Deleted

## 2022-03-19 LAB — SURGICAL PATHOLOGY

## 2022-03-21 ENCOUNTER — Encounter: Payer: Self-pay | Admitting: *Deleted

## 2022-03-21 ENCOUNTER — Other Ambulatory Visit: Payer: Self-pay | Admitting: *Deleted

## 2022-03-21 NOTE — Patient Instructions (Signed)
Visit Information  Thank you for taking time to visit with me today. Please don't hesitate to contact me if I can be of assistance to you before our next scheduled telephone appointment.  Following are the goals we discussed today:  Keep scheduled appointment for AWV  Please call the Suicide and Crisis Lifeline: 988 call the [place] National Suicide Prevention Lifeline: 205 872 1714 or TTY: 331 272 9445 TTY (917)863-1720) to talk to a trained counselor call 1-800-273-TALK (toll free, 24 hour hotline) call the [facility]: [phone] call 911 if you are experiencing a Mental Health or [place] or need someone to talk to.  Patient verbalizes understanding of instructions and care plan provided today and agrees to view in [place]. Active MyChart status and patient understanding of how to access instructions and care plan via MyChart confirmed with patient.     The patient has been provided with contact information for the care management team and has been advised to call with any health related questions or concerns.   Valente David, RN, MSN, [place] [facility] Care Management Coordinator [phone]

## 2022-03-21 NOTE — Patient Outreach (Signed)
  Care Coordination   Initial Visit Note   03/21/2022 Name: RIYAAN HEROUX MRN: [id number] DOB: June 10, 1941  NICANDRO PERRAULT is a 81 y.o. year old male who sees [employer], MD for primary care. I spoke with  Ihor Austin by phone today  What matters to the patients health and wellness today?  Report he is managing HTN and DM well, A1C 6.9 which is below goal of 7, had colonoscopy last week, no issues.   Still able to work, report AWV scheduled for next month.   Goals Addressed             This Visit's Progress    COMPLETED: Care Coordination Activities - No follow up needed       Care Coordination Interventions: Provided education to patient about basic DM disease process Reviewed medications with patient and discussed importance of medication adherence Discussed plans with patient for ongoing care management follow up and provided patient with direct contact information for care management team Reviewed scheduled/upcoming provider appointments including: Need for AWV Assessed social determinant of health barriers         SDOH assessments and interventions completed:  Yes  SDOH Interventions Today    Flowsheet Row Most Recent Value  SDOH Interventions   Food Insecurity Interventions Intervention Not Indicated  Financial Strain Interventions Intervention Not Indicated  Housing Interventions Intervention Not Indicated  Transportation Interventions Intervention Not Indicated        Care Coordination Interventions Activated:  Yes  Care Coordination Interventions:  Yes, provided   Follow up plan: No further intervention required.   Encounter Outcome:  Pt. Visit Completed   Valente David, RN, MSN, [place] Care [facility] Care Management Coordinator [phone]

## 2022-03-22 DIAGNOSIS — H353221 Exudative age-related macular degeneration, left eye, with active choroidal neovascularization: Secondary | ICD-10-CM | POA: Diagnosis not present

## 2022-03-27 DIAGNOSIS — M79642 Pain in left hand: Secondary | ICD-10-CM | POA: Diagnosis not present

## 2022-03-27 DIAGNOSIS — M79672 Pain in left foot: Secondary | ICD-10-CM | POA: Diagnosis not present

## 2022-03-27 DIAGNOSIS — M79671 Pain in right foot: Secondary | ICD-10-CM | POA: Diagnosis not present

## 2022-03-27 DIAGNOSIS — M109 Gout, unspecified: Secondary | ICD-10-CM | POA: Diagnosis not present

## 2022-03-27 DIAGNOSIS — L4050 Arthropathic psoriasis, unspecified: Secondary | ICD-10-CM | POA: Diagnosis not present

## 2022-03-27 DIAGNOSIS — M329 Systemic lupus erythematosus, unspecified: Secondary | ICD-10-CM | POA: Diagnosis not present

## 2022-03-27 DIAGNOSIS — M1990 Unspecified osteoarthritis, unspecified site: Secondary | ICD-10-CM | POA: Diagnosis not present

## 2022-04-03 DIAGNOSIS — H353211 Exudative age-related macular degeneration, right eye, with active choroidal neovascularization: Secondary | ICD-10-CM | POA: Diagnosis not present

## 2022-04-04 DIAGNOSIS — E1142 Type 2 diabetes mellitus with diabetic polyneuropathy: Secondary | ICD-10-CM | POA: Diagnosis not present

## 2022-04-04 DIAGNOSIS — B351 Tinea unguium: Secondary | ICD-10-CM | POA: Diagnosis not present

## 2022-04-10 ENCOUNTER — Other Ambulatory Visit: Payer: Self-pay | Admitting: Cardiology

## 2022-04-11 DIAGNOSIS — M329 Systemic lupus erythematosus, unspecified: Secondary | ICD-10-CM | POA: Diagnosis not present

## 2022-04-11 DIAGNOSIS — M109 Gout, unspecified: Secondary | ICD-10-CM | POA: Diagnosis not present

## 2022-04-11 DIAGNOSIS — M1990 Unspecified osteoarthritis, unspecified site: Secondary | ICD-10-CM | POA: Diagnosis not present

## 2022-04-11 DIAGNOSIS — Z1159 Encounter for screening for other viral diseases: Secondary | ICD-10-CM | POA: Diagnosis not present

## 2022-04-11 DIAGNOSIS — L4050 Arthropathic psoriasis, unspecified: Secondary | ICD-10-CM | POA: Diagnosis not present

## 2022-04-13 ENCOUNTER — Other Ambulatory Visit: Payer: Self-pay | Admitting: Cardiology

## 2022-04-17 DIAGNOSIS — R0781 Pleurodynia: Secondary | ICD-10-CM | POA: Diagnosis not present

## 2022-04-17 DIAGNOSIS — C679 Malignant neoplasm of bladder, unspecified: Secondary | ICD-10-CM | POA: Diagnosis not present

## 2022-04-17 DIAGNOSIS — I700 Atherosclerosis of aorta: Secondary | ICD-10-CM | POA: Diagnosis not present

## 2022-04-17 DIAGNOSIS — L4053 Psoriatic spondylitis: Secondary | ICD-10-CM | POA: Diagnosis not present

## 2022-04-18 DIAGNOSIS — E118 Type 2 diabetes mellitus with unspecified complications: Secondary | ICD-10-CM | POA: Diagnosis not present

## 2022-04-18 DIAGNOSIS — D518 Other vitamin B12 deficiency anemias: Secondary | ICD-10-CM | POA: Diagnosis not present

## 2022-04-18 DIAGNOSIS — I10 Essential (primary) hypertension: Secondary | ICD-10-CM | POA: Diagnosis not present

## 2022-04-18 DIAGNOSIS — E559 Vitamin D deficiency, unspecified: Secondary | ICD-10-CM | POA: Diagnosis not present

## 2022-04-24 DIAGNOSIS — H353221 Exudative age-related macular degeneration, left eye, with active choroidal neovascularization: Secondary | ICD-10-CM | POA: Diagnosis not present

## 2022-05-06 ENCOUNTER — Other Ambulatory Visit: Payer: Self-pay | Admitting: Cardiology

## 2022-05-15 DIAGNOSIS — H3530 Unspecified macular degeneration: Secondary | ICD-10-CM | POA: Diagnosis not present

## 2022-05-15 DIAGNOSIS — M150 Primary generalized (osteo)arthritis: Secondary | ICD-10-CM | POA: Diagnosis not present

## 2022-05-15 DIAGNOSIS — F411 Generalized anxiety disorder: Secondary | ICD-10-CM | POA: Diagnosis not present

## 2022-05-23 DIAGNOSIS — M329 Systemic lupus erythematosus, unspecified: Secondary | ICD-10-CM | POA: Diagnosis not present

## 2022-05-23 DIAGNOSIS — M1990 Unspecified osteoarthritis, unspecified site: Secondary | ICD-10-CM | POA: Diagnosis not present

## 2022-05-23 DIAGNOSIS — M109 Gout, unspecified: Secondary | ICD-10-CM | POA: Diagnosis not present

## 2022-05-23 DIAGNOSIS — L4050 Arthropathic psoriasis, unspecified: Secondary | ICD-10-CM | POA: Diagnosis not present

## 2022-05-27 DIAGNOSIS — H353221 Exudative age-related macular degeneration, left eye, with active choroidal neovascularization: Secondary | ICD-10-CM | POA: Diagnosis not present

## 2022-06-13 DIAGNOSIS — L821 Other seborrheic keratosis: Secondary | ICD-10-CM | POA: Diagnosis not present

## 2022-06-13 DIAGNOSIS — L820 Inflamed seborrheic keratosis: Secondary | ICD-10-CM | POA: Diagnosis not present

## 2022-06-15 ENCOUNTER — Other Ambulatory Visit: Payer: Self-pay | Admitting: Cardiology

## 2022-07-01 DIAGNOSIS — H353221 Exudative age-related macular degeneration, left eye, with active choroidal neovascularization: Secondary | ICD-10-CM | POA: Diagnosis not present

## 2022-07-04 DIAGNOSIS — E1142 Type 2 diabetes mellitus with diabetic polyneuropathy: Secondary | ICD-10-CM | POA: Diagnosis not present

## 2022-07-04 DIAGNOSIS — B351 Tinea unguium: Secondary | ICD-10-CM | POA: Diagnosis not present

## 2022-07-08 ENCOUNTER — Other Ambulatory Visit: Payer: Self-pay | Admitting: Cardiology

## 2022-07-09 ENCOUNTER — Telehealth: Payer: Self-pay | Admitting: Cardiology

## 2022-07-09 NOTE — Telephone Encounter (Signed)
Will stay on spironolactone, even if he is taking Lasix.  Okay to take Lasix as needed for swelling.  We can give him a prescription if necessary.  Will do the 20 mg as needed daily for swelling.  Can take 2 tabs initially significant swelling.  If he ends up taking it every day, then we can just change the prescription to daily.  Disp # 30 tab, 11 refills.  [place]

## 2022-07-09 NOTE — Telephone Encounter (Signed)
Called and spoke with wife, she states her husband is having issues with swelling bilateral legs, right leg more than left. He normally weighs and she states he stays around 250 lbs, give or take 5 lbs. She states he started over the weekend taking his lasix 20 mg daily dosage back (I did not see this on his recent medication list). After review of the chart, last OV note with Dr.Harding  Actually, his swelling is pretty much minimal today.  He did not really do well with the support stockings because they were too tight around the leg just below the knee.  (We talked about trying above-the-knee stockings that can be rolled down to below the knee and then use Velcro to hold in place. Also discussed with elevation. Lasix has been stopped but spironolactone continued.   Plan: Continue to try different support stockings, and foot elevation as well as pedaling exercises. He is on spironolactone and no longer on loop or thiazide diuretic.    Patient wife is asking if he should continue with lasix and spironolactone (this was discontinued as well and no longer on medication list due to expired RX) she would like to know if he should start back taking both of these, he was taking spironolactone daily 12.5 mg, however stopped it when he began back on the lasix 20 mg daily.   He denies SOB, or any other symptoms only that the legs are now weeping which is new, only in the right leg. He is currently taking amlodipine 10 mg daily as well. He is not using compression stockings, and tried to elevate legs when he can. Diet is the same, limits sodium intake.  Advised I would route to MD to review if any other changes should be made.

## 2022-07-09 NOTE — Telephone Encounter (Signed)
Pt c/o swelling: STAT is pt has developed SOB within 24 hours  How much weight have you gained and in what time span? Patient's wife unsure, she states he normally maintains his weight  If swelling, where is the swelling located?  Legs, they are weeping fluid   Are you currently taking a fluid pill?  Furosemide and Spironolactone   Are you currently SOB?  No, but patient has been tired   Do you have a log of your daily weights (if so, list)?  No   Have you gained 3 pounds in a day or 5 pounds in a week?  No   Have you traveled recently?  No

## 2022-07-10 ENCOUNTER — Other Ambulatory Visit: Payer: Self-pay

## 2022-07-10 MED ORDER — FUROSEMIDE 20 MG PO TABS: 20.0000 mg | ORAL_TABLET | Freq: Every day | ORAL | 11 refills | Status: DC | PRN

## 2022-07-10 NOTE — Telephone Encounter (Signed)
Called patient wife back, advised of note from MD.   Patient wife verbalized understanding.  RX sent to pharmacy.

## 2022-07-10 NOTE — Telephone Encounter (Signed)
Called patient, LVM to call back to discuss message from MD.  Left call back number.

## 2022-07-10 NOTE — Telephone Encounter (Signed)
Wife was returning call. Please advise ?

## 2022-08-01 DIAGNOSIS — M25562 Pain in left knee: Secondary | ICD-10-CM | POA: Diagnosis not present

## 2022-08-01 DIAGNOSIS — M25561 Pain in right knee: Secondary | ICD-10-CM | POA: Diagnosis not present

## 2022-08-01 DIAGNOSIS — Z96653 Presence of artificial knee joint, bilateral: Secondary | ICD-10-CM | POA: Diagnosis not present

## 2022-08-05 DIAGNOSIS — H353221 Exudative age-related macular degeneration, left eye, with active choroidal neovascularization: Secondary | ICD-10-CM | POA: Diagnosis not present

## 2022-08-12 DIAGNOSIS — J0100 Acute maxillary sinusitis, unspecified: Secondary | ICD-10-CM | POA: Diagnosis not present

## 2022-08-12 DIAGNOSIS — E6609 Other obesity due to excess calories: Secondary | ICD-10-CM | POA: Diagnosis not present

## 2022-08-12 DIAGNOSIS — Z6835 Body mass index (BMI) 35.0-35.9, adult: Secondary | ICD-10-CM | POA: Diagnosis not present

## 2022-08-12 DIAGNOSIS — L4053 Psoriatic spondylitis: Secondary | ICD-10-CM | POA: Diagnosis not present

## 2022-08-12 DIAGNOSIS — E1129 Type 2 diabetes mellitus with other diabetic kidney complication: Secondary | ICD-10-CM | POA: Diagnosis not present

## 2022-08-12 DIAGNOSIS — C679 Malignant neoplasm of bladder, unspecified: Secondary | ICD-10-CM | POA: Diagnosis not present

## 2022-08-12 DIAGNOSIS — I10 Essential (primary) hypertension: Secondary | ICD-10-CM | POA: Diagnosis not present

## 2022-08-12 DIAGNOSIS — M329 Systemic lupus erythematosus, unspecified: Secondary | ICD-10-CM | POA: Diagnosis not present

## 2022-08-12 DIAGNOSIS — F5101 Primary insomnia: Secondary | ICD-10-CM | POA: Diagnosis not present

## 2022-08-12 DIAGNOSIS — G720 Drug-induced myopathy: Secondary | ICD-10-CM | POA: Diagnosis not present

## 2022-08-17 ENCOUNTER — Other Ambulatory Visit: Payer: Self-pay | Admitting: Cardiology

## 2022-08-27 DIAGNOSIS — M109 Gout, unspecified: Secondary | ICD-10-CM | POA: Diagnosis not present

## 2022-08-27 DIAGNOSIS — M1990 Unspecified osteoarthritis, unspecified site: Secondary | ICD-10-CM | POA: Diagnosis not present

## 2022-08-27 DIAGNOSIS — L4050 Arthropathic psoriasis, unspecified: Secondary | ICD-10-CM | POA: Diagnosis not present

## 2022-08-27 DIAGNOSIS — Z85820 Personal history of malignant melanoma of skin: Secondary | ICD-10-CM | POA: Diagnosis not present

## 2022-08-29 ENCOUNTER — Telehealth: Payer: Self-pay | Admitting: *Deleted

## 2022-08-29 NOTE — Telephone Encounter (Signed)
Spoke to patient, he states he is having issue with  "sugar and Fluid"   RN asked if discuss with primary  about sugar. He states yes    PAtient staes he taking medication , but not using compression socks   Appointment schedule for 08/29/22

## 2022-08-30 ENCOUNTER — Ambulatory Visit: Payer: Medicare Other | Admitting: Cardiology

## 2022-08-30 DIAGNOSIS — R0781 Pleurodynia: Secondary | ICD-10-CM | POA: Diagnosis not present

## 2022-08-30 DIAGNOSIS — M25572 Pain in left ankle and joints of left foot: Secondary | ICD-10-CM | POA: Diagnosis not present

## 2022-09-02 ENCOUNTER — Encounter: Payer: Self-pay | Admitting: Cardiology

## 2022-09-02 NOTE — Progress Notes (Unsigned)
Primary Care Provider: Redmond School, [place] Cardiologist: Glenetta Hew, MD Electrophysiologist: None  Referring Provider: Redmond School, MD  Clinic Note: No chief complaint on file.   ===================================  ASSESSMENT/PLAN   Problem List Items Addressed This Visit       Cardiology Problems   Chronic stable angina [facility]) - negative Myoview & no new Dz on Cath (Chronic)   CAD S/P percutaneous coronary angioplasty: 08/07/11 cutting balloon PTCA to 95% ostial diag. and 90%  proximal LCX.  prior cutting balloon to RCA  & ostium of ostial diag., 2009  - Primary (Chronic)   Hyperlipidemia associated with type 2 diabetes mellitus (HCC) (Chronic)   Essential hypertension (Chronic)   Venous stasis of both lower extremities (Chronic)   Sinus bradycardia (Chronic)   ===================================  HPI:    Arthur Lutz is a 82 y.o. male with a PMH notable for CAD (PTCA/12 of ostial D2 in 2009 and 2013, BMS PCI to RCA 2009, DES PCI of LCx in 2013), HTN (now difficult control), HLD, DM-2 and Chronic Venous Stasis who presents today for essentially annual follow-up to discuss worsening edema.Arthur Lutz was last seen on September 14, 2021 as a 1 month follow-up (second post hospital follow-up).  We discussed his recent Myoview (08/29/2021) showing no evidence of ischemia or infarction-small apical defect thought to be apical thinning. => Indicated that he was taking medications as directed and was not having any more chest discomfort.  Was still taking amlodipine and lisinopril, but unclear of the dose.  Noted that BP was better since initiating therapy.  Was not able to tolerate hydralazine 100 mg 3 times daily was down to 50 mg 3 times daily.  He is finally recovered from his [place].  Recent Hospitalizations:  Colonoscopy with polypectomy 03/15/2022  Contact the office back in December 2023 with complaints of lower extremity edema. => Recommended  staying on spironolactone even while on Lasix.  Continue Lasix as necessary-up to 2 tablets as needed.  Called back on February 1 with complaints of "sugar and fluid ".  In the end he was taking medications but not using compression stockings.  Appointment scheduled initially for 08/29/2022 but then rescheduled for 09/02/2022.  Reviewed  CV studies:    The following studies were reviewed today: (if available, images/films reviewed: From Epic Chart or Care Everywhere) No new studies:  Interval History:   Arthur Lutz   CV Review of Symptoms (Summary):  {roscv:310661} Cardiovascular ROS: Negative for chest pain, shortness of breath,.  Positive for lower extremity edema which is chronic.  : no chest pain or dyspnea on exertion positive for - edema and -notably improved exercise intolerance.  Still has some dizziness/lightheadedness but improved negative for - irregular heartbeat, orthopnea, palpitations, paroxysmal nocturnal dyspnea, rapid heart rate, shortness of breath, or  syncope/near syncope or TIA/amaurosis fugax, claudication.  REVIEWED OF SYSTEMS   ROS  Leg swelling, nausea, frequency and urgency.  Joint pain.  Dizziness.  I have reviewed and (if needed) personally updated the patient's problem list, medications, allergies, past medical and surgical history, social and family history.   PAST MEDICAL HISTORY   Past Medical History:  Diagnosis Date   Allergic rhinitis    Aortic valve sclerosis    Cause of murmur   Arthritis of knee, left, needs total Knee in future with Dr. Wynelle Link  08/07/2011   Bladder cancer ([place]) 11/2005   CAD S/P percutaneous coronary angioplasty 08/07/2011   NEW 08/07/11 cutting  balloon PTCA to 95% ostial diag. and 90%  proximal LCX.  prior cutting balloon and to RCA  & ostium of ostial diag; Last Cath 02/2012 - Patent Diag PTCA, RCA & Circ BMS stents. Normal EF & EDP; Myoview 2/14 no evidence of ischemia or infarction   Diabetes mellitus type 2, controlled, with  complications ([place])    Diabetes mellitus without complication ([place])    Dyslipidemia    History of heart attack 11/2007   "mild; did not affect the heart muscle"; PCI to RCA, Cutting PTCA Diag ostium   Hypertension    Macular degeneration    Macular degeneration of right eye    Now getting shots.    Melanoma of skin ([place]) ~ 2005   "level 2; on my back"   Myocardial infarction [facility]) 2012   Osteoarthritis    Status post right knee arthroplasty, positive for staph infection. Pending right arthroplasty   Portacath in place 08/11/2012   Statin intolerance 08/07/2011   Stroke ([place]) 1980's   "had facial strokes; 2; about a year apart; never did find what caused them"    PAST SURGICAL HISTORY   Past Surgical History:  Procedure Laterality Date   7-day ZIO PATCH MONITOR  06/2021   Predominant rhythm-sinus w/ bundle branch block/IVCD noted.  HR range 35-136 bpm.  Average 67 bpm. HR 35 bpm @ 1AM. 19 atrial runs: Fastest and longest interval was 27.5 seconds (60 beats) with an average heart rate of 132 bpm, max 54 bpm =>occasionally w/ aberrancy & NOT not noted on diary.  Occasional isolated PACs.  No sustained tachy or bradycardic arrhythmia.  No chronotropic incompetence.   BIOPSY  05/28/2019   Procedure: BIOPSY;  Surgeon: Rogene Houston, MD;  [facility];  Service: Endoscopy;;  cecum   CATARACT EXTRACTION W/ INTRAOCULAR LENS IMPLANT  ~ 2010   right   CATARACT EXTRACTION W/PHACO Left 11/21/2016   Procedure: CATARACT EXTRACTION PHACO AND INTRAOCULAR LENS PLACEMENT ([place]) CDE - 11.70 ;  Surgeon: Tonny Branch, MD;  Location: [facility] OR[facility];  Service: Ophthalmology;  Laterality: Left;  left   cold cup removal bladder lesion  11/2005   "malignant"   COLONOSCOPY N/A 12/09/2014   Procedure: COLONOSCOPY;  Surgeon: Rogene Houston, MD;  [facility];  Service: Endoscopy;  Laterality: N/A;  955 - moved to 8:30 - Ann to notify pt   COLONOSCOPY WITH PROPOFOL N/A 05/28/2019   Procedure:  COLONOSCOPY WITH PROPOFOL;  Surgeon: Rogene Houston, MD;  [facility];  Service: Endoscopy;  Laterality: N/A;   COLONOSCOPY WITH PROPOFOL N/A 03/15/2022   Procedure: COLONOSCOPY WITH PROPOFOL;  Surgeon: Harvel Quale, MD;  [facility];  Service: Gastroenterology;  Laterality: N/A;  230 ASA 2   CORONARY ANGIOPLASTY WITH STENT PLACEMENT  11/2007   Mid RCA - Driver BMS 3.5 mm x 15 mm; 2.0 mm Cutting Balloon PTCA - ostial D1   CORONARY ANGIOPLASTY WITH STENT PLACEMENT  08/07/2011   Abnormal TM Myoview - for exertional throat discomfort: Patent RCA stent, 90% circumflex -- Vision BMS 3.5 mm x 12 mm --> 4.2 mm. Ostial D2 95% -- 2.25 mm Cutting Balloon PTCA   DOPPLER ECHOCARDIOGRAPHY  11/2007   Normal EF, impaired relaxation, with sclerotic aortic valve. No stenosis.   INGUINAL HERNIA REPAIR  ~ 1970   left   KNEE ARTHROSCOPY  02/20/2011   left   LEFT HEART CATHETERIZATION WITH CORONARY ANGIOGRAM N/A 08/07/2011   Procedure: LEFT HEART  CATHETERIZATION WITH CORONARY ANGIOGRAM;  Surgeon: Leonie Man, MD;  Location: [facility] CATH LAB;  Service: Cardiovascular;  Laterality: N/A;   LEFT HEART CATHETERIZATION WITH CORONARY ANGIOGRAM N/A 03/06/2012   Procedure: LEFT HEART CATHETERIZATION WITH CORONARY ANGIOGRAM;  Surgeon: Leonie Man, MD;  Location: [facility] CATH LAB;  Service: Cardiovascular;;Widely patent RCA and LCx stents. Also patent PTCA site to D1, ~40-50% D2. Normal EF, normal EDP   NM MYOVIEW LTD  08/29/2021   a) 07/2012: Low risk, EF 49%, improved from prior. Small apical and apical septal defect, fixed likely artifact no skin or infarction.; b)  08/2021: LOW RISK.  EF 60 to 65% (61%).  No evidence of ischemia or infarction.  Small apical defect likely related to apical thinning.   PARTIAL KNEE ARTHROPLASTY  06/2003   right   PERCUTANEOUS CORONARY STENT INTERVENTION (PCI-S)  08/07/2011   Procedure: PERCUTANEOUS CORONARY STENT INTERVENTION (PCI-S);  Surgeon: Leonie Man, MD;  Location: [facility] CATH LAB;  Service: Cardiovascular;;   POLYPECTOMY  05/28/2019   Procedure: POLYPECTOMY;  Surgeon: Rogene Houston, MD;  [facility];  Service: Endoscopy;;  colon   POLYPECTOMY  03/15/2022   Procedure: POLYPECTOMY INTESTINAL;  Surgeon: Harvel Quale, MD;  [facility];  Service: Gastroenterology;;   TONSILLECTOMY     "as a child"   TOTAL KNEE ARTHROPLASTY  11/2003   right x2 and one on left- Total of 3.   TOTAL KNEE ARTHROPLASTY Left 09/28/2012   Procedure: TOTAL KNEE ARTHROPLASTY;  Surgeon: Gearlean Alf, MD;  Location: [facility];  Service: Orthopedics;  Laterality: Left;   TRANSTHORACIC ECHOCARDIOGRAM  05/14/2021   EF 55 to 60%.  No R WMA.  GR 1 DD.   Mild LA dilation. Normal PAP and RAP.  Essentially normal.    Immunization History  Administered Date(s) Administered   Influenza-Unspecified 05/29/2018   Tdap 02/01/2017    MEDICATIONS/ALLERGIES   No outpatient medications have been marked as taking for the 09/03/22 encounter (Appointment) with Leonie Man, MD.    Allergies  Allergen Reactions   Aspirin Other (See Comments)    Was told to not take this due to having macular degeneration   Rosuvastatin Calcium Other (See Comments)    Aches and cramps   Other Rash and Other (See Comments)    Red meat = develops a rash on the back    SOCIAL HISTORY/FAMILY HISTORY   Reviewed in Epic:  Pertinent findings:  Social History   Tobacco Use   Smoking status: Former    Packs/day: 2.00    Years: 28.00    Total pack years: 56.00    Types: Cigarettes    Quit date: 07/29/1980    Years since quitting: 42.1   Smokeless tobacco: Never  Vaping Use   Vaping Use: Never used  Substance Use Topics   Alcohol use: Yes    Alcohol/week: 2.0 standard drinks of alcohol    Types: 1 Glasses of wine, 1 Standard drinks or equivalent per week    Comment:  "wine or whiskey" occasionally   Drug use: No   Social History   Social  History Narrative   He is a married father of 2, grandfather of 58. He is not really getting a lot of exercise now. He previously had been working out on a treadmill at least 2 times a day for 15 minutes at a time, but he   is not able to do that as much now because his  knee hurts him too bad. Does not smoke and only occasional alcoholic beverage.    OBJCTIVE -PE, EKG, labs   Wt Readings from Last 3 Encounters:  09/14/21 246 lb (111.6 kg)  08/29/21 249 lb (112.9 kg)  08/10/21 249 lb (112.9 kg)    Physical Exam: There were no vitals taken for this visit. Physical Exam  General: He is not in acute distress (Back to baseline).    Appearance: Normal appearance. He is obese. He is not ill-appearing.     Comments: Well-nourished, well-groomed.  Healthy-appearing.  No longer feels worn out.  Seems to be back to his baseline self.  HENT:     Head: Normocephalic and atraumatic.  Neck:     Vascular: Normal carotid pulses. No carotid bruit or JVD.  Cardiovascular:     Rate and Rhythm: Normal rate and regular rhythm. No extrasystoles are present.    Chest Wall: PMI is not displaced.     Pulses: Decreased pulses (Diminished, but palpable.  Feet are warm.).     Heart sounds: S1 normal and S2 normal. Heart sounds are distant. No murmur heard.   No friction rub. No gallop.     Comments: Normal S1, split S2. Pulmonary:     Effort: Pulmonary effort is normal. No respiratory distress.     Breath sounds: Normal breath sounds. No wheezing, rhonchi or rales.  Chest:     Chest wall: No tenderness.  Musculoskeletal:        General: Swelling (Symptoms 1 pitting bilaterally) present.     Cervical back: Normal range of motion and neck supple.  Skin:    General: Skin is warm and dry.     Findings: Bruising: Mild.  Neurological:     General: No focal deficit present.     Mental Status: He is alert and oriented to person, place, and time.     Gait: Abnormal gait: Somewhat slow gait.  Psychiatric:         Mood and Affect: Mood normal.        Behavior: Behavior normal.        Thought Content: Thought content normal.        Judgment: Judgment normal.    - mild BLE Venous Stasis Derm changes   Adult ECG Report  Rate: *** ;  Rhythm: {rhythm:17366};   Narrative Interpretation: ***  Recent Labs:  ***  Lab Results  Component Value Date   CHOL 112 08/20/2021   HDL 36 (L) 08/20/2021   LDLCALC 56 08/20/2021   TRIG 105 08/20/2021   CHOLHDL 3.1 08/20/2021   Lab Results  Component Value Date   CREATININE 0.93 08/20/2021   BUN 17 08/20/2021   NA 143 08/20/2021   K 4.1 08/20/2021   CL 105 08/20/2021   CO2 26 08/20/2021      Latest Ref Rng & Units 05/16/2021    3:01 AM 05/15/2021    6:03 AM 05/14/2021    7:49 AM  CBC  WBC 4.0 - 10.5 K/uL 7.7  9.6  12.7   Hemoglobin 13.0 - 17.0 g/dL 12.4  12.2  12.5   Hematocrit 39.0 - 52.0 % 38.6  36.1  38.2   Platelets 150 - 400 K/uL 149  123  135     Lab Results  Component Value Date   HGBA1C 6.9 (H) 05/13/2021   Lab Results  Component Value Date   TSH 1.760 08/21/2020    ================================================== I spent a total of ***minutes with the patient spent  in direct patient consultation.  Additional time spent with chart review  / charting (studies, outside notes, etc): *** min Total Time: *** min  Current medicines are reviewed at length with the patient today.  (+/- concerns) ***  Notice: This dictation was prepared with Dragon dictation along with smart phrase technology. Any transcriptional errors that result from this process are unintentional and may not be corrected upon review.  Studies Ordered:   No orders of the defined types were placed in this encounter.  No orders of the defined types were placed in this encounter.   Patient Instructions / Medication Changes & Studies & Tests Ordered   There are no Patient Instructions on file for this visit.     Leonie Man, MD, MS Glenetta Hew,  M.D., M.S. Interventional Cardiologist  [facility]  Pager # [phone] Phone # [phone] [address]. [place], [place] [address]   Thank you for choosing [facility]!!

## 2022-09-03 ENCOUNTER — Encounter: Payer: Self-pay | Admitting: Cardiology

## 2022-09-03 ENCOUNTER — Ambulatory Visit: Payer: Medicare Other | Attending: Cardiology | Admitting: Cardiology

## 2022-09-03 VITALS — BP 130/72 | HR 97 | Ht 72.0 in | Wt 252.0 lb

## 2022-09-03 DIAGNOSIS — I2089 Other forms of angina pectoris: Secondary | ICD-10-CM

## 2022-09-03 DIAGNOSIS — I10 Essential (primary) hypertension: Secondary | ICD-10-CM

## 2022-09-03 DIAGNOSIS — M19041 Primary osteoarthritis, right hand: Secondary | ICD-10-CM

## 2022-09-03 DIAGNOSIS — Z9861 Coronary angioplasty status: Secondary | ICD-10-CM | POA: Diagnosis not present

## 2022-09-03 DIAGNOSIS — R001 Bradycardia, unspecified: Secondary | ICD-10-CM | POA: Diagnosis not present

## 2022-09-03 DIAGNOSIS — I2510 Atherosclerotic heart disease of native coronary artery without angina pectoris: Secondary | ICD-10-CM

## 2022-09-03 DIAGNOSIS — R0609 Other forms of dyspnea: Secondary | ICD-10-CM

## 2022-09-03 DIAGNOSIS — I878 Other specified disorders of veins: Secondary | ICD-10-CM

## 2022-09-03 DIAGNOSIS — M19042 Primary osteoarthritis, left hand: Secondary | ICD-10-CM

## 2022-09-03 DIAGNOSIS — E785 Hyperlipidemia, unspecified: Secondary | ICD-10-CM

## 2022-09-03 DIAGNOSIS — M1712 Unilateral primary osteoarthritis, left knee: Secondary | ICD-10-CM

## 2022-09-03 DIAGNOSIS — E1169 Type 2 diabetes mellitus with other specified complication: Secondary | ICD-10-CM

## 2022-09-03 DIAGNOSIS — E669 Obesity, unspecified: Secondary | ICD-10-CM

## 2022-09-03 MED ORDER — PREDNISONE 10 MG PO TABS: ORAL_TABLET | ORAL | 1 refills | Status: DC

## 2022-09-03 MED ORDER — SPIRONOLACTONE 25 MG PO TABS: 25.0000 mg | ORAL_TABLET | Freq: Every day | ORAL | 3 refills | Status: DC

## 2022-09-03 MED ORDER — FUROSEMIDE 20 MG PO TABS: 20.0000 mg | ORAL_TABLET | Freq: Every day | ORAL | 3 refills | Status: DC | PRN

## 2022-09-03 NOTE — Patient Instructions (Addendum)
Medication Instructions:  No changes   *If you need a refill on your cardiac medications before your next appointment, please call your pharmacy*   Lab Work: Not needed    Testing/Procedures:  Not needed  Follow-Up: At [facility], you and your health needs are our priority.  As part of our continuing mission to provide you with exceptional heart care, we have created designated Provider Care Teams.  These Care Teams include your primary Cardiologist (physician) and Advanced Practice Providers (APPs -  Physician Assistants and Nurse Practitioners) who all work together to provide you with the care you need, when you need it.     Your next appointment:   12 month(s)  The format for your next appointment:   In Person  Provider:   Glenetta Hew, MD    Other Instructions   recommends you purchase some compression  socks/hose from [employer] in [place] ,[place]. You do not need an prescription to purchase the items.  Address  [address] [place], [place] [address]  Phone  [phone]   Compression   strength    8-15 mmHg X15-20 mmHg                           20-30 mmHg  30-40 mmHg.  You may also try a medical supply store, department store (i.e.- [place], [employer], [employer], specialty shoe stores ( shoe market), [employer] and [employer]) or  Chartered loss adjuster uniform store.  or [employer]

## 2022-09-04 ENCOUNTER — Encounter: Payer: Self-pay | Admitting: Cardiology

## 2022-09-04 DIAGNOSIS — M19041 Primary osteoarthritis, right hand: Secondary | ICD-10-CM | POA: Insufficient documentation

## 2022-09-04 DIAGNOSIS — H353221 Exudative age-related macular degeneration, left eye, with active choroidal neovascularization: Secondary | ICD-10-CM | POA: Diagnosis not present

## 2022-09-04 NOTE — Assessment & Plan Note (Signed)
Angina well-controlled with amlodipine and Imdur.

## 2022-09-04 NOTE — Assessment & Plan Note (Signed)
BP fairly well-controlled current medications including: Amlodipine 10 L daily, lisinopril 20 mg (one half of 40 mg tablet) daily, Hydralazine 50 mg 3 times daily along with Imdur 60 mg Spironolactone 25 mg daily Target BP range for systolic is 015 to 615 mmHg.  Would not be overly aggressive as he has had some issues with orthostatic symptoms.  His energy level is much better when he is more in the 130s to 140 mmHg range.

## 2022-09-04 NOTE — Assessment & Plan Note (Signed)
Arthritis of hands and feet.  Also knees bothering her as well.  Will give prednisone Dosepak (30 mg daily x 2 days, 20 mg daily x 2 days and 10 mg daily x 2 days).

## 2022-09-04 NOTE — Assessment & Plan Note (Signed)
The patient understands the need to lose weight with diet and exercise. We have discussed specific strategies for this.  

## 2022-09-04 NOTE — Assessment & Plan Note (Signed)
Unable to tolerate beta-blocker, however today he is heart rate is 97 which is unusual.

## 2022-09-04 NOTE — Assessment & Plan Note (Deleted)
He has intermittent bouts of arthritis which is very limiting.  He has not been on to get into see his orthopedic doctor.  He usually has and tapered Dosepak of prednisone for breakthrough spells, and he is noticing that his hands are starting to get more stiff and painful.  He usually has this treated with a short taper of 30 mg daily x 2 days, 20 mg daily x 2 days and then 10 mg daily x 2 days.--Prescription provided.

## 2022-09-04 NOTE — Assessment & Plan Note (Signed)
In the absence of PND or orthopnea, I think his leg swelling is probably more related to venous stasis.  He is still on very low-dose Lasix 20 mg daily which is okay to take additional dose.  He is also on spironolactone for BP and diuretic resistance.  Recommend compression stockings in the 15 to 20 mmHg range.  Foot elevation several times a day.

## 2022-09-04 NOTE — Assessment & Plan Note (Signed)
He is on Zetia and simvastatin.  Based on last labs, well within goal.  Normal LFTs.  Last A1c was in September 2023 was 6.6-down from previous 6.9.  He said that he has had 1 even more recently where the A1c was down to 6.3.  Will defer to PCP, but he is no longer on metformin or any other therapy.  Would consider SGLT2 inhibitor such as [place] or Jardiance.

## 2022-09-04 NOTE — Assessment & Plan Note (Signed)
Stable if not improved.  Nonischemic Myoview back in February 2023.  No major complaints since.

## 2022-09-04 NOTE — Assessment & Plan Note (Signed)
Doing relatively well.  No active angina type symptoms.  Myoview and February 2023 was nonischemic. No beta-blocker due to bradycardia.  Plan: Continue amlodipine 10 mg along with Imdur 60 mg for antianginal benefit  Continue 81 mg aspirin Continue combination of Zetia plus simvastatin Continue combination of lisinopril and hydralazine for afterload reduction.

## 2022-09-12 DIAGNOSIS — D225 Melanocytic nevi of trunk: Secondary | ICD-10-CM | POA: Diagnosis not present

## 2022-09-12 DIAGNOSIS — L814 Other melanin hyperpigmentation: Secondary | ICD-10-CM | POA: Diagnosis not present

## 2022-09-12 DIAGNOSIS — L578 Other skin changes due to chronic exposure to nonionizing radiation: Secondary | ICD-10-CM | POA: Diagnosis not present

## 2022-09-12 DIAGNOSIS — L820 Inflamed seborrheic keratosis: Secondary | ICD-10-CM | POA: Diagnosis not present

## 2022-09-19 DIAGNOSIS — M79673 Pain in unspecified foot: Secondary | ICD-10-CM | POA: Diagnosis not present

## 2022-09-19 DIAGNOSIS — B351 Tinea unguium: Secondary | ICD-10-CM | POA: Diagnosis not present

## 2022-09-19 DIAGNOSIS — E1142 Type 2 diabetes mellitus with diabetic polyneuropathy: Secondary | ICD-10-CM | POA: Diagnosis not present

## 2022-09-19 DIAGNOSIS — L309 Dermatitis, unspecified: Secondary | ICD-10-CM | POA: Diagnosis not present

## 2022-09-23 DIAGNOSIS — H353211 Exudative age-related macular degeneration, right eye, with active choroidal neovascularization: Secondary | ICD-10-CM | POA: Diagnosis not present

## 2022-10-02 ENCOUNTER — Other Ambulatory Visit: Payer: Self-pay | Admitting: Cardiology

## 2022-10-03 DIAGNOSIS — N401 Enlarged prostate with lower urinary tract symptoms: Secondary | ICD-10-CM | POA: Diagnosis not present

## 2022-10-03 DIAGNOSIS — N138 Other obstructive and reflux uropathy: Secondary | ICD-10-CM | POA: Diagnosis not present

## 2022-10-03 DIAGNOSIS — Z8551 Personal history of malignant neoplasm of bladder: Secondary | ICD-10-CM | POA: Diagnosis not present

## 2022-10-03 DIAGNOSIS — Z08 Encounter for follow-up examination after completed treatment for malignant neoplasm: Secondary | ICD-10-CM | POA: Diagnosis not present

## 2022-10-07 DIAGNOSIS — Z85820 Personal history of malignant melanoma of skin: Secondary | ICD-10-CM | POA: Diagnosis not present

## 2022-10-07 DIAGNOSIS — M1990 Unspecified osteoarthritis, unspecified site: Secondary | ICD-10-CM | POA: Diagnosis not present

## 2022-10-07 DIAGNOSIS — L4050 Arthropathic psoriasis, unspecified: Secondary | ICD-10-CM | POA: Diagnosis not present

## 2022-10-07 DIAGNOSIS — M109 Gout, unspecified: Secondary | ICD-10-CM | POA: Diagnosis not present

## 2022-10-09 DIAGNOSIS — H353221 Exudative age-related macular degeneration, left eye, with active choroidal neovascularization: Secondary | ICD-10-CM | POA: Diagnosis not present

## 2022-10-16 ENCOUNTER — Other Ambulatory Visit (HOSPITAL_COMMUNITY): Payer: Self-pay | Admitting: Dermatology

## 2022-10-16 DIAGNOSIS — L570 Actinic keratosis: Secondary | ICD-10-CM | POA: Diagnosis not present

## 2022-10-16 DIAGNOSIS — D489 Neoplasm of uncertain behavior, unspecified: Secondary | ICD-10-CM

## 2022-10-16 DIAGNOSIS — L821 Other seborrheic keratosis: Secondary | ICD-10-CM | POA: Diagnosis not present

## 2022-10-21 ENCOUNTER — Ambulatory Visit (HOSPITAL_COMMUNITY)
Admission: RE | Admit: 2022-10-21 | Discharge: 2022-10-21 | Disposition: A | Discharge status: Home / Self Care | Payer: Medicare Other | Source: Ambulatory Visit | Attending: Dermatology | Admitting: Dermatology

## 2022-10-21 DIAGNOSIS — D489 Neoplasm of uncertain behavior, unspecified: Secondary | ICD-10-CM | POA: Insufficient documentation

## 2022-10-21 DIAGNOSIS — R2232 Localized swelling, mass and lump, left upper limb: Secondary | ICD-10-CM | POA: Diagnosis not present

## 2022-10-25 ENCOUNTER — Ambulatory Visit (HOSPITAL_COMMUNITY): Payer: Medicare Other

## 2022-10-25 ENCOUNTER — Encounter (HOSPITAL_COMMUNITY): Payer: Self-pay

## 2022-10-30 DIAGNOSIS — H33102 Unspecified retinoschisis, left eye: Secondary | ICD-10-CM | POA: Diagnosis not present

## 2022-10-30 DIAGNOSIS — H353231 Exudative age-related macular degeneration, bilateral, with active choroidal neovascularization: Secondary | ICD-10-CM | POA: Diagnosis not present

## 2022-10-30 DIAGNOSIS — H47392 Other disorders of optic disc, left eye: Secondary | ICD-10-CM | POA: Diagnosis not present

## 2022-10-30 DIAGNOSIS — H43392 Other vitreous opacities, left eye: Secondary | ICD-10-CM | POA: Diagnosis not present

## 2022-10-30 DIAGNOSIS — H43812 Vitreous degeneration, left eye: Secondary | ICD-10-CM | POA: Diagnosis not present

## 2022-10-30 DIAGNOSIS — H35363 Drusen (degenerative) of macula, bilateral: Secondary | ICD-10-CM | POA: Diagnosis not present

## 2022-11-11 ENCOUNTER — Other Ambulatory Visit: Payer: Self-pay | Admitting: Cardiology

## 2022-11-13 DIAGNOSIS — H353221 Exudative age-related macular degeneration, left eye, with active choroidal neovascularization: Secondary | ICD-10-CM | POA: Diagnosis not present

## 2022-11-19 DIAGNOSIS — H0279 Other degenerative disorders of eyelid and periocular area: Secondary | ICD-10-CM | POA: Diagnosis not present

## 2022-11-19 DIAGNOSIS — H57813 Brow ptosis, bilateral: Secondary | ICD-10-CM | POA: Diagnosis not present

## 2022-11-19 DIAGNOSIS — H02834 Dermatochalasis of left upper eyelid: Secondary | ICD-10-CM | POA: Diagnosis not present

## 2022-11-19 DIAGNOSIS — H02413 Mechanical ptosis of bilateral eyelids: Secondary | ICD-10-CM | POA: Diagnosis not present

## 2022-11-19 DIAGNOSIS — H02831 Dermatochalasis of right upper eyelid: Secondary | ICD-10-CM | POA: Diagnosis not present

## 2022-11-28 DIAGNOSIS — E1142 Type 2 diabetes mellitus with diabetic polyneuropathy: Secondary | ICD-10-CM | POA: Diagnosis not present

## 2022-11-28 DIAGNOSIS — B351 Tinea unguium: Secondary | ICD-10-CM | POA: Diagnosis not present

## 2022-11-28 DIAGNOSIS — M79673 Pain in unspecified foot: Secondary | ICD-10-CM | POA: Diagnosis not present

## 2022-11-29 DIAGNOSIS — I10 Essential (primary) hypertension: Secondary | ICD-10-CM | POA: Diagnosis not present

## 2022-11-30 ENCOUNTER — Other Ambulatory Visit: Payer: Self-pay | Admitting: Physician Assistant

## 2022-12-02 NOTE — Telephone Encounter (Signed)
Pt of Dr. Harding. Please review for refill. Thank you! 

## 2022-12-11 ENCOUNTER — Other Ambulatory Visit: Payer: Self-pay | Admitting: Cardiology

## 2022-12-16 DIAGNOSIS — H353221 Exudative age-related macular degeneration, left eye, with active choroidal neovascularization: Secondary | ICD-10-CM | POA: Diagnosis not present

## 2023-01-09 DIAGNOSIS — Z79899 Other long term (current) drug therapy: Secondary | ICD-10-CM | POA: Diagnosis not present

## 2023-01-09 DIAGNOSIS — L4050 Arthropathic psoriasis, unspecified: Secondary | ICD-10-CM | POA: Diagnosis not present

## 2023-01-09 DIAGNOSIS — M109 Gout, unspecified: Secondary | ICD-10-CM | POA: Diagnosis not present

## 2023-01-09 DIAGNOSIS — M1990 Unspecified osteoarthritis, unspecified site: Secondary | ICD-10-CM | POA: Diagnosis not present

## 2023-01-13 DIAGNOSIS — H353221 Exudative age-related macular degeneration, left eye, with active choroidal neovascularization: Secondary | ICD-10-CM | POA: Diagnosis not present

## 2023-01-16 ENCOUNTER — Other Ambulatory Visit: Payer: Self-pay | Admitting: Cardiology

## 2023-01-28 DIAGNOSIS — E1142 Type 2 diabetes mellitus with diabetic polyneuropathy: Secondary | ICD-10-CM | POA: Diagnosis not present

## 2023-01-28 DIAGNOSIS — L600 Ingrowing nail: Secondary | ICD-10-CM | POA: Diagnosis not present

## 2023-02-06 DIAGNOSIS — B351 Tinea unguium: Secondary | ICD-10-CM | POA: Diagnosis not present

## 2023-02-06 DIAGNOSIS — E1142 Type 2 diabetes mellitus with diabetic polyneuropathy: Secondary | ICD-10-CM | POA: Diagnosis not present

## 2023-02-25 DIAGNOSIS — H524 Presbyopia: Secondary | ICD-10-CM | POA: Diagnosis not present

## 2023-03-17 DIAGNOSIS — H353221 Exudative age-related macular degeneration, left eye, with active choroidal neovascularization: Secondary | ICD-10-CM | POA: Diagnosis not present

## 2023-03-24 DIAGNOSIS — H353211 Exudative age-related macular degeneration, right eye, with active choroidal neovascularization: Secondary | ICD-10-CM | POA: Diagnosis not present

## 2023-03-29 ENCOUNTER — Other Ambulatory Visit: Payer: Self-pay | Admitting: Cardiology

## 2023-04-03 DIAGNOSIS — R350 Frequency of micturition: Secondary | ICD-10-CM | POA: Diagnosis not present

## 2023-04-03 DIAGNOSIS — R972 Elevated prostate specific antigen [PSA]: Secondary | ICD-10-CM | POA: Diagnosis not present

## 2023-04-03 DIAGNOSIS — N401 Enlarged prostate with lower urinary tract symptoms: Secondary | ICD-10-CM | POA: Diagnosis not present

## 2023-04-03 DIAGNOSIS — R3915 Urgency of urination: Secondary | ICD-10-CM | POA: Diagnosis not present

## 2023-04-10 DIAGNOSIS — L989 Disorder of the skin and subcutaneous tissue, unspecified: Secondary | ICD-10-CM | POA: Diagnosis not present

## 2023-04-10 DIAGNOSIS — L720 Epidermal cyst: Secondary | ICD-10-CM | POA: Diagnosis not present

## 2023-04-11 ENCOUNTER — Other Ambulatory Visit (HOSPITAL_COMMUNITY): Payer: Self-pay | Admitting: Dermatology

## 2023-04-11 DIAGNOSIS — L989 Disorder of the skin and subcutaneous tissue, unspecified: Secondary | ICD-10-CM

## 2023-04-11 DIAGNOSIS — L720 Epidermal cyst: Secondary | ICD-10-CM

## 2023-04-16 ENCOUNTER — Ambulatory Visit (HOSPITAL_COMMUNITY)
Admission: RE | Admit: 2023-04-16 | Discharge: 2023-04-16 | Disposition: A | Discharge status: Home / Self Care | Payer: Medicare Other | Source: Ambulatory Visit | Attending: Dermatology | Admitting: Dermatology

## 2023-04-16 DIAGNOSIS — L720 Epidermal cyst: Secondary | ICD-10-CM | POA: Insufficient documentation

## 2023-04-16 DIAGNOSIS — L989 Disorder of the skin and subcutaneous tissue, unspecified: Secondary | ICD-10-CM | POA: Diagnosis not present

## 2023-04-16 DIAGNOSIS — R590 Localized enlarged lymph nodes: Secondary | ICD-10-CM | POA: Diagnosis not present

## 2023-04-16 MED ORDER — GADOBUTROL 1 MMOL/ML IV SOLN
10.0000 mL | Freq: Once | INTRAVENOUS | Status: AC | PRN
  Administered 2023-04-16: 10 mL via INTRAVENOUS

## 2023-04-17 DIAGNOSIS — M79671 Pain in right foot: Secondary | ICD-10-CM | POA: Diagnosis not present

## 2023-04-17 DIAGNOSIS — M79672 Pain in left foot: Secondary | ICD-10-CM | POA: Diagnosis not present

## 2023-04-17 DIAGNOSIS — B351 Tinea unguium: Secondary | ICD-10-CM | POA: Diagnosis not present

## 2023-04-17 DIAGNOSIS — E1142 Type 2 diabetes mellitus with diabetic polyneuropathy: Secondary | ICD-10-CM | POA: Diagnosis not present

## 2023-04-21 DIAGNOSIS — H57813 Brow ptosis, bilateral: Secondary | ICD-10-CM | POA: Diagnosis not present

## 2023-04-21 DIAGNOSIS — H02831 Dermatochalasis of right upper eyelid: Secondary | ICD-10-CM | POA: Diagnosis not present

## 2023-04-21 DIAGNOSIS — H02021 Mechanical entropion of right upper eyelid: Secondary | ICD-10-CM | POA: Diagnosis not present

## 2023-04-21 DIAGNOSIS — H02413 Mechanical ptosis of bilateral eyelids: Secondary | ICD-10-CM | POA: Diagnosis not present

## 2023-04-21 DIAGNOSIS — H02834 Dermatochalasis of left upper eyelid: Secondary | ICD-10-CM | POA: Diagnosis not present

## 2023-04-21 DIAGNOSIS — H02024 Mechanical entropion of left upper eyelid: Secondary | ICD-10-CM | POA: Diagnosis not present

## 2023-04-29 DIAGNOSIS — Z125 Encounter for screening for malignant neoplasm of prostate: Secondary | ICD-10-CM | POA: Diagnosis not present

## 2023-04-29 DIAGNOSIS — Z0001 Encounter for general adult medical examination with abnormal findings: Secondary | ICD-10-CM | POA: Diagnosis not present

## 2023-04-29 DIAGNOSIS — G720 Drug-induced myopathy: Secondary | ICD-10-CM | POA: Diagnosis not present

## 2023-04-29 DIAGNOSIS — E039 Hypothyroidism, unspecified: Secondary | ICD-10-CM | POA: Diagnosis not present

## 2023-04-29 DIAGNOSIS — I10 Essential (primary) hypertension: Secondary | ICD-10-CM | POA: Diagnosis not present

## 2023-04-29 DIAGNOSIS — M329 Systemic lupus erythematosus, unspecified: Secondary | ICD-10-CM | POA: Diagnosis not present

## 2023-04-29 DIAGNOSIS — D518 Other vitamin B12 deficiency anemias: Secondary | ICD-10-CM | POA: Diagnosis not present

## 2023-04-29 DIAGNOSIS — E6609 Other obesity due to excess calories: Secondary | ICD-10-CM | POA: Diagnosis not present

## 2023-04-29 DIAGNOSIS — I700 Atherosclerosis of aorta: Secondary | ICD-10-CM | POA: Diagnosis not present

## 2023-04-29 DIAGNOSIS — L4053 Psoriatic spondylitis: Secondary | ICD-10-CM | POA: Diagnosis not present

## 2023-04-29 DIAGNOSIS — E559 Vitamin D deficiency, unspecified: Secondary | ICD-10-CM | POA: Diagnosis not present

## 2023-04-29 DIAGNOSIS — N182 Chronic kidney disease, stage 2 (mild): Secondary | ICD-10-CM | POA: Diagnosis not present

## 2023-04-29 DIAGNOSIS — E1129 Type 2 diabetes mellitus with other diabetic kidney complication: Secondary | ICD-10-CM | POA: Diagnosis not present

## 2023-04-29 DIAGNOSIS — Z23 Encounter for immunization: Secondary | ICD-10-CM | POA: Diagnosis not present

## 2023-04-29 DIAGNOSIS — Z6834 Body mass index (BMI) 34.0-34.9, adult: Secondary | ICD-10-CM | POA: Diagnosis not present

## 2023-04-29 DIAGNOSIS — Z1331 Encounter for screening for depression: Secondary | ICD-10-CM | POA: Diagnosis not present

## 2023-05-02 ENCOUNTER — Other Ambulatory Visit (HOSPITAL_COMMUNITY): Payer: Self-pay | Admitting: Dermatology

## 2023-05-02 DIAGNOSIS — D489 Neoplasm of uncertain behavior, unspecified: Secondary | ICD-10-CM

## 2023-05-02 NOTE — Progress Notes (Unsigned)
PROCEDURE / BIOPSY REVIEW Date: 05/02/23  Requested Biopsy site: RIGHT shoulder soft tissues Reason for request: Mass Imaging review: Best seen on [place] ST 10/21/22  Decision: Approved Imaging modality to perform: Ultrasound Schedule with: No sedation / Local anesthetic Schedule for: Any VIR  Additional comments:  @VIR : R shoulder SQ palpable mass @Schedulers . No AC hold req  Please contact me with questions, concerns, or if issue pertaining to this request arise.  Roanna Banning, MD [facility]

## 2023-05-05 ENCOUNTER — Ambulatory Visit (HOSPITAL_COMMUNITY)
Admission: RE | Admit: 2023-05-05 | Discharge: 2023-05-05 | Disposition: A | Discharge status: Home / Self Care | Payer: Medicare Other | Source: Ambulatory Visit | Attending: Dermatology | Admitting: Dermatology

## 2023-05-05 DIAGNOSIS — C4359 Malignant melanoma of other part of trunk: Secondary | ICD-10-CM | POA: Diagnosis not present

## 2023-05-05 DIAGNOSIS — R2231 Localized swelling, mass and lump, right upper limb: Secondary | ICD-10-CM | POA: Insufficient documentation

## 2023-05-05 DIAGNOSIS — D489 Neoplasm of uncertain behavior, unspecified: Secondary | ICD-10-CM

## 2023-05-05 DIAGNOSIS — R229 Localized swelling, mass and lump, unspecified: Secondary | ICD-10-CM | POA: Diagnosis not present

## 2023-05-05 DIAGNOSIS — Z85820 Personal history of malignant melanoma of skin: Secondary | ICD-10-CM | POA: Insufficient documentation

## 2023-05-05 MED ORDER — LIDOCAINE-EPINEPHRINE (PF) 2 %-1:200000 IJ SOLN
INTRAMUSCULAR | Status: AC
  Filled 2023-05-05: qty 20

## 2023-05-05 NOTE — Procedures (Signed)
Pre Procedure Dx: Indeterminate right upper back subcutaneous nodules. Post Procedural Dx: Same  Technically successful US guided biopsy of dominant indeterminate right upper back subcutaneous nodule.  EBL: Trace No immediate complications.   Katherina Right, MD Pager #: [phone]

## 2023-05-07 DIAGNOSIS — C4359 Malignant melanoma of other part of trunk: Secondary | ICD-10-CM | POA: Diagnosis not present

## 2023-05-07 LAB — SURGICAL PATHOLOGY

## 2023-05-19 DIAGNOSIS — H353221 Exudative age-related macular degeneration, left eye, with active choroidal neovascularization: Secondary | ICD-10-CM | POA: Diagnosis not present

## 2023-05-26 DIAGNOSIS — C4359 Malignant melanoma of other part of trunk: Secondary | ICD-10-CM | POA: Diagnosis not present

## 2023-05-26 DIAGNOSIS — C4330 Malignant melanoma of unspecified part of face: Secondary | ICD-10-CM | POA: Diagnosis not present

## 2023-05-26 DIAGNOSIS — Z87891 Personal history of nicotine dependence: Secondary | ICD-10-CM | POA: Diagnosis not present

## 2023-05-26 DIAGNOSIS — L0889 Other specified local infections of the skin and subcutaneous tissue: Secondary | ICD-10-CM | POA: Diagnosis not present

## 2023-05-26 DIAGNOSIS — C439 Malignant melanoma of skin, unspecified: Secondary | ICD-10-CM | POA: Diagnosis not present

## 2023-05-28 DIAGNOSIS — Z79899 Other long term (current) drug therapy: Secondary | ICD-10-CM | POA: Diagnosis not present

## 2023-05-28 DIAGNOSIS — L4050 Arthropathic psoriasis, unspecified: Secondary | ICD-10-CM | POA: Diagnosis not present

## 2023-05-28 DIAGNOSIS — M109 Gout, unspecified: Secondary | ICD-10-CM | POA: Diagnosis not present

## 2023-05-28 DIAGNOSIS — C439 Malignant melanoma of skin, unspecified: Secondary | ICD-10-CM | POA: Diagnosis not present

## 2023-06-04 DIAGNOSIS — D485 Neoplasm of uncertain behavior of skin: Secondary | ICD-10-CM | POA: Diagnosis not present

## 2023-06-04 DIAGNOSIS — C4331 Malignant melanoma of nose: Secondary | ICD-10-CM | POA: Diagnosis not present

## 2023-06-04 DIAGNOSIS — C4330 Malignant melanoma of unspecified part of face: Secondary | ICD-10-CM | POA: Diagnosis not present

## 2023-06-05 DIAGNOSIS — C439 Malignant melanoma of skin, unspecified: Secondary | ICD-10-CM | POA: Diagnosis not present

## 2023-06-05 DIAGNOSIS — R918 Other nonspecific abnormal finding of lung field: Secondary | ICD-10-CM | POA: Diagnosis not present

## 2023-06-05 DIAGNOSIS — C4359 Malignant melanoma of other part of trunk: Secondary | ICD-10-CM | POA: Diagnosis not present

## 2023-06-16 DIAGNOSIS — L4050 Arthropathic psoriasis, unspecified: Secondary | ICD-10-CM | POA: Diagnosis not present

## 2023-06-16 DIAGNOSIS — C4359 Malignant melanoma of other part of trunk: Secondary | ICD-10-CM | POA: Diagnosis not present

## 2023-06-16 DIAGNOSIS — C439 Malignant melanoma of skin, unspecified: Secondary | ICD-10-CM | POA: Diagnosis not present

## 2023-06-16 DIAGNOSIS — C4330 Malignant melanoma of unspecified part of face: Secondary | ICD-10-CM | POA: Diagnosis not present

## 2023-06-18 DIAGNOSIS — C439 Malignant melanoma of skin, unspecified: Secondary | ICD-10-CM | POA: Diagnosis not present

## 2023-06-18 DIAGNOSIS — G9389 Other specified disorders of brain: Secondary | ICD-10-CM | POA: Diagnosis not present

## 2023-06-23 DIAGNOSIS — C4361 Malignant melanoma of right upper limb, including shoulder: Secondary | ICD-10-CM | POA: Diagnosis not present

## 2023-06-23 DIAGNOSIS — Z79899 Other long term (current) drug therapy: Secondary | ICD-10-CM | POA: Diagnosis not present

## 2023-06-23 DIAGNOSIS — Z8551 Personal history of malignant neoplasm of bladder: Secondary | ICD-10-CM | POA: Diagnosis not present

## 2023-06-23 DIAGNOSIS — Z5112 Encounter for antineoplastic immunotherapy: Secondary | ICD-10-CM | POA: Diagnosis not present

## 2023-06-23 DIAGNOSIS — C439 Malignant melanoma of skin, unspecified: Secondary | ICD-10-CM | POA: Diagnosis not present

## 2023-06-23 DIAGNOSIS — Z87891 Personal history of nicotine dependence: Secondary | ICD-10-CM | POA: Diagnosis not present

## 2023-06-23 DIAGNOSIS — C4359 Malignant melanoma of other part of trunk: Secondary | ICD-10-CM | POA: Diagnosis not present

## 2023-06-23 DIAGNOSIS — C7931 Secondary malignant neoplasm of brain: Secondary | ICD-10-CM | POA: Diagnosis not present

## 2023-06-30 ENCOUNTER — Ambulatory Visit
Admission: EM | Admit: 2023-06-30 | Discharge: 2023-06-30 | Disposition: A | Discharge status: Home / Self Care | Payer: Medicare Other | Attending: Nurse Practitioner | Admitting: Nurse Practitioner

## 2023-06-30 DIAGNOSIS — R2241 Localized swelling, mass and lump, right lower limb: Secondary | ICD-10-CM | POA: Diagnosis not present

## 2023-06-30 DIAGNOSIS — L03115 Cellulitis of right lower limb: Secondary | ICD-10-CM

## 2023-06-30 MED ORDER — CEPHALEXIN 500 MG PO CAPS: 500.0000 mg | ORAL_CAPSULE | Freq: Four times a day (QID) | ORAL | 0 refills | Status: AC

## 2023-06-30 NOTE — ED Triage Notes (Signed)
Pt presents with redness, warm to the touch and swelling to right leg that started 6-8 weeks ago and is getting worse, left leg is starting swell but not red or warm to the touch like the right leg.

## 2023-06-30 NOTE — Discharge Instructions (Signed)
Take the Keflex as prescribed to treat for infection in the skin of your leg.  If symptoms do not improve significantly towards the middle of the week, please follow-up with your primary care provider or return here to be seen.  If you develop chest pain, shortness of breath, fever, or heart racing, please seek care emergently.

## 2023-06-30 NOTE — ED Notes (Signed)
Pt oxygen saturation level is 88 pt states his normal oxygen level is between 85-89, states he does not wear oxygen. Provider notified.

## 2023-06-30 NOTE — ED Provider Notes (Signed)
[facility]    CSN: [id number] Arrival date & time: 06/30/23  1502      History   Chief Complaint Chief Complaint  Patient presents with   Leg Swelling    HPI Arthur Lutz is a 82 y.o. male.   Patient presents today with right lower extremity swelling, warmth, and redness that has been ongoing for the past few weeks.  Reports in the past 1 to 2 weeks, it has gotten worse and he reports the left leg is starting to swell as well.  He denies chest pain or shortness of breath orthopnea.  He is taking furosemide as prescribed.  No fevers or nausea/vomiting.  Denies history of similar.  No open wounds or leaking on his legs.  Pain is a 4 out of 10 at rest and slightly worse with ambulation.  Has tried taking more furosemide and compression stockings without relief.    Past Medical History:  Diagnosis Date   Allergic rhinitis    Aortic valve sclerosis    Cause of murmur   Arthritis of knee, left, needs total Knee in future with Dr. Lequita Halt  08/07/2011   Bladder cancer (HCC) 11/2005   CAD S/P percutaneous coronary angioplasty 08/07/2011   NEW 08/07/11 cutting balloon PTCA to 95% ostial diag. and 90%  proximal LCX.  prior cutting balloon and to RCA  & ostium of ostial diag; Last Cath 02/2012 - Patent Diag PTCA, RCA & Circ BMS stents. Normal EF & EDP; Myoview 2/14 no evidence of ischemia or infarction   Diabetes mellitus type 2, controlled, with complications (HCC)    Dyslipidemia    History of heart attack 11/2007   5/'09: "mild; did not affect the heart muscle"; PCI to RCA, Cutting PTCA Diag ostium;   Hypertension    Macular degeneration    Macular degeneration of right eye    Now getting shots.    Melanoma of skin (HCC) ~ 2005   "level 2; on my back"   Osteoarthritis    Status post right knee arthroplasty, positive for staph infection. Pending right arthroplasty   Portacath in place 08/11/2012   Statin intolerance 08/07/2011   Stroke [facility]) 1980's   "had facial  strokes; 2; about a year apart; never did find what caused them"    Patient Active Problem List   Diagnosis Date Noted   Arthritis of both hands 09/04/2022   Acute metabolic encephalopathy    AKI (acute kidney injury) (HCC) 05/12/2021   Chest pain 05/12/2021   COVID-19 virus infection 05/12/2021   Elevated d-dimer 05/12/2021   Psoriasis 04/23/2019   Colitis 04/06/2019   Diarrhea 04/06/2019   Abdominal pain 03/29/2019   Seasonal and perennial allergic rhinitis 01/06/2019   Sinus bradycardia 08/29/2017   Chronic stable angina (HCC) - negative Myoview & no new Dz on Cath 05/23/2015   Insomnia due to stress 07/04/2014    Class: Chronic   Venous stasis of both lower extremities 11/24/2013   Abnormal biliary HIDA scan 09/15/2013   Intestinal bacterial overgrowth 08/16/2013   Bloating 08/16/2013   Obesity (BMI 30.0-34.9) 01/30/2013   Stiffness of joint, not elsewhere classified, lower leg 10/26/2012   Weakness of left leg 10/26/2012   Difficulty walking 10/26/2012   Postoperative anemia due to acute blood loss 09/29/2012   OA (osteoarthritis) of knee 09/28/2012   Port-A-Cath in place 08/11/2012   CAD S/P percutaneous coronary angioplasty: 08/07/11 cutting balloon PTCA to 95% ostial diag. and 90%  proximal LCX.  prior cutting  balloon to RCA  & ostium of ostial diag., 2009  08/07/2011   Essential hypertension 08/07/2011   DOE (dyspnea on exertion) 08/07/2011   Diabetes mellitus type 2, controlled, with complications (HCC) 08/07/2011   Hyperlipidemia associated with type 2 diabetes mellitus (HCC) 08/07/2011   Arthritis of knee, left, needs total Knee in future with Dr. Lequita Halt  08/07/2011   Statin myopathy 08/07/2011    Past Surgical History:  Procedure Laterality Date   7-day ZIO PATCH MONITOR  06/2021   Predominant rhythm-sinus w/ bundle branch block/IVCD noted.  HR range 35-136 bpm.  Average 67 bpm. HR 35 bpm @ 1AM. 19 atrial runs: Fastest and longest interval was 27.5 seconds (60  beats) with an average heart rate of 132 bpm, max 54 bpm =>occasionally w/ aberrancy & NOT not noted on diary.  Occasional isolated PACs.  No sustained tachy or bradycardic arrhythmia.  No chronotropic incompetence.   BIOPSY  05/28/2019   Procedure: BIOPSY;  Surgeon: Malissa Hippo, MD;  Location: [facility];  Service: Endoscopy;;  cecum   CATARACT EXTRACTION W/ INTRAOCULAR LENS IMPLANT  ~ 2010   right   CATARACT EXTRACTION W/PHACO Left 11/21/2016   Procedure: CATARACT EXTRACTION PHACO AND INTRAOCULAR LENS PLACEMENT (IOC) CDE - 11.70 ;  Surgeon: Gemma Payor, MD;  Location: [facility];  Service: Ophthalmology;  Laterality: Left;  left   cold cup removal bladder lesion  11/2005   "malignant"   COLONOSCOPY N/A 12/09/2014   Procedure: COLONOSCOPY;  Surgeon: Malissa Hippo, MD;  Location: [facility];  Service: Endoscopy;  Laterality: N/A;  955 - moved to 8:30 - Ann to notify pt   COLONOSCOPY WITH PROPOFOL N/A 05/28/2019   Procedure: COLONOSCOPY WITH PROPOFOL;  Surgeon: Malissa Hippo, MD;  [facility]: [facility];  Service: Endoscopy;  Laterality: N/A;   COLONOSCOPY WITH PROPOFOL N/A 03/15/2022   Procedure: COLONOSCOPY WITH PROPOFOL;  Surgeon: Dolores Frame, MD;  Location: [facility];  Service: Gastroenterology;  Laterality: N/A;  230 ASA 2   CORONARY ANGIOPLASTY WITH STENT PLACEMENT  11/2007   Mid RCA - Driver BMS 3.5 mm x 15 mm; 2.0 mm Cutting Balloon PTCA - ostial D1   INGUINAL HERNIA REPAIR  ~ 1970   left   KNEE ARTHROSCOPY  02/20/2011   left   LEFT HEART CATHETERIZATION WITH CORONARY ANGIOGRAM N/A 08/07/2011   Procedure: LEFT HEART CATHETERIZATION WITH CORONARY ANGIOGRAM;  Surgeon: Marykay Lex, MD;  Location: [facility] CATH LAB;  Service: Cardiovascular;  Laterality: N/A;;; Abnormal TM Myoview - for exertional throat discomfort: Patent RCA stent, 90% circumflex -- BMS PCI; Ostial D2 95% - PTCA   LEFT HEART CATHETERIZATION WITH CORONARY ANGIOGRAM N/A 03/06/2012   Procedure:  LEFT HEART CATHETERIZATION WITH CORONARY ANGIOGRAM;  Surgeon: Marykay Lex, MD;  Location: [facility] CATH LAB;  Service: Cardiovascular;;Widely patent RCA and LCx stents. Also patent PTCA site to D1, ~40-50% D2. Normal EF, normal EDP   NM MYOVIEW LTD  08/29/2021   a) 07/2012: Low risk, EF 49%, improved from prior. Small apical and apical septal defect, fixed likely artifact no skin or infarction.; b)  08/2021: LOW RISK.  EF 60 to 65% (61%).  No evidence of ischemia or infarction.  Small apical defect likely related to apical thinning.   PARTIAL KNEE ARTHROPLASTY  06/2003   right   PERCUTANEOUS CORONARY STENT INTERVENTION (PCI-S)  08/07/2011   Procedure: PERCUTANEOUS CORONARY STENT INTERVENTION (PCI-S);  Surgeon: Marykay Lex, MD;  Location: [facility] CATH LAB;  Service: CV;;  Abnormal TM Myoview - for exertional throat discomfort: Patent RCA stent, 90% circumflex -- Vision BMS 3.5 mm x 12 mm --> 4.2 mm. Ostial D2 95% -- 2.25 mm Cutting Balloon PTCA   POLYPECTOMY  05/28/2019   Procedure: POLYPECTOMY;  Surgeon: Malissa Hippo, MD;  [facility];  Service: Endoscopy;;  colon   POLYPECTOMY  03/15/2022   Procedure: POLYPECTOMY INTESTINAL;  Surgeon: Dolores Frame, MD;  Location: [facility];  Service: Gastroenterology;;   TONSILLECTOMY     "as a child"   TOTAL KNEE ARTHROPLASTY  11/2003   right x2 and one on left- Total of 3.   TOTAL KNEE ARTHROPLASTY Left 09/28/2012   Procedure: TOTAL KNEE ARTHROPLASTY;  Surgeon: Loanne Drilling, MD;  Location: [facility];  Service: Orthopedics;  Laterality: Left;   TRANSTHORACIC ECHOCARDIOGRAM  05/14/2021   EF 55 to 60%.  No R WMA.  GR 1 DD.   Mild LA dilation. Normal PAP and RAP.  Essentially normal.       Home Medications    Prior to Admission medications   Medication Sig Start Date End Date Taking? Authorizing Provider  amLODipine (NORVASC) 10 MG tablet TAKE (1) TABLET BY MOUTH ONCE DAILY. 04/01/23  Yes Marykay Lex, MD  Apremilast (OTEZLA)  30 MG TABS Take as directed 05/23/22  Yes [provider]  cephALEXin (KEFLEX) 500 MG capsule Take 1 capsule (500 mg total) by mouth 4 (four) times daily for 5 days. 06/30/23 07/05/23 Yes Cathlean Marseilles A, NP  clobetasol (TEMOVATE) 0.05 % external solution Apply 1 application topically daily as needed (irritation). 03/01/19  Yes [provider]  Coenzyme Q10 (COQ10) 100 MG CAPS Take 100 mg by mouth daily.   Yes [provider]  ezetimibe (ZETIA) 10 MG tablet Take 10 mg by mouth daily.   Yes [provider]  furosemide (LASIX) 20 MG tablet Take 1 tablet (20 mg total) by mouth daily as needed. An may take an additional 20 mg if needed 09/03/22  Yes Marykay Lex, MD  gabapentin (NEURONTIN) 100 MG capsule Take 100 mg by mouth at bedtime as needed (pain).   Yes [provider]  hydrALAZINE (APRESOLINE) 50 MG tablet TAKE (1) TABLET BY MOUTH (3) TIMES DAILY. 12/02/22  Yes Marykay Lex, MD  isosorbide mononitrate (IMDUR) 60 MG 24 hr tablet TAKE (1) TABLET BY MOUTH ONCE DAILY. 11/13/22  Yes Marykay Lex, MD  lisinopril (ZESTRIL) 40 MG tablet TAKE 1/2 TABLET BY MOUTH ONCE DAILY. 07/10/22  Yes Marykay Lex, MD  simvastatin (ZOCOR) 20 MG tablet TAKE (1) TABLET BY MOUTH AT BEDTIME. 10/03/22  Yes Marykay Lex, MD  spironolactone (ALDACTONE) 25 MG tablet Take 1 tablet (25 mg total) by mouth daily. 09/03/22  Yes Marykay Lex, MD  triazolam (HALCION) 0.125 MG tablet Take 1.5 tablets (0.1875 mg total) by mouth at bedtime. Pt takes 1.5 tabs at bedtime Patient taking differently: Take 0.25 mg by mouth at bedtime. Pt takes 2 tabs at bedtime 08/29/17  Yes Marykay Lex, MD  acetaminophen (TYLENOL) 650 MG CR tablet Take 1,300 mg by mouth every 8 (eight) hours as needed for pain.    [provider]  azelastine (ASTELIN) 0.1 % nasal spray Place 1 spray into the nose daily as needed (allergies).    [provider]  EPINEPHrine 0.3 mg/0.3 mL IJ SOAJ  injection Inject 0.3 mg into the muscle as needed for anaphylaxis. 12/25/18   [provider]  loperamide (IMODIUM) 2 MG capsule Take 1 capsule (2 mg total) by mouth 4 (four) times daily as needed for diarrhea or loose stools. 03/30/19   Sabas Sous, MD  LOTEMAX 0.5 % OINT Place 1 application into the left eye daily as needed (irritation, dry eyes). 02/15/21   [provider]  magnesium gluconate (MAGONATE) 500 MG tablet Take 500 mg by mouth daily.    [provider]  meloxicam (MOBIC) 15 MG tablet Take 15 mg by mouth daily as needed for pain.    [provider]  Naltrexone HCl, Pain, (NALTREX) 4.5 MG CAPS Take 4.5 mg by mouth every evening.    [provider]  naproxen sodium (ALEVE) 220 MG tablet Take 440 mg by mouth 2 (two) times daily as needed (pain).    [provider]  OVER THE COUNTER MEDICATION Take 2 Capfuls by mouth daily. HistaQuel (Histamine)    [provider]  OVER THE COUNTER MEDICATION Take 1 capsule by mouth daily. Blood Sugar Formula    [provider]  prednisoLONE acetate (PRED FORTE) 1 % ophthalmic suspension Place 1 drop into both eyes 2 (two) times daily as needed (irritation). 03/08/21   [provider]  predniSONE (DELTASONE) 10 MG tablet TAKE AS DIRECTED AS NEEDED FOR ARTHRITIS. TAKE 3 TABLETS FOR 2 DAYS, THEN 2 TABLETS FOR 1 DAY, THEN 1 TABLET FOR 1 DAY. 01/16/23   Marykay Lex, MD  tobramycin (TOBREX) 0.3 % ophthalmic solution Place 1 drop into both eyes daily as needed (before, during and after eye injection). 02/20/21   [provider]    Family History Family History  Problem Relation Age of Onset   Emphysema Father    Lung cancer Paternal Uncle        x 2 smokers   Colon cancer Neg Hx     Social History Social History   Tobacco Use   Smoking status: Former    Current packs/day: 0.00    Average packs/day: 2.0 packs/day for 28.0 years (56.0 ttl pk-yrs)    Types:  Cigarettes    Start date: 07/29/1952    Quit date: 07/29/1980    Years since quitting: 42.9   Smokeless tobacco: Never  Vaping Use   Vaping status: Never Used  Substance Use Topics   Alcohol use: Yes    Alcohol/week: 2.0 standard drinks of alcohol    Types: 1 Glasses of wine, 1 Standard drinks or equivalent per week    Comment:  "wine or whiskey" occasionally   Drug use: No     Allergies   Apremilast, Aspirin, Leflunomide, Methotrexate, Rosuvastatin calcium, and Other   Review of Systems Review of Systems Per HPI  Physical Exam Triage Vital Signs ED Triage Vitals  Encounter Vitals Group     BP 06/30/23 1606 (!) 147/88     Systolic BP Percentile --      Diastolic BP Percentile --      Pulse Rate 06/30/23 1606 77     Resp 06/30/23 1606 18     Temp 06/30/23 1606 98.4 F (36.9 C)     Temp Source 06/30/23 1606 Oral     SpO2 06/30/23 1606 (!) 88 %     Weight --      Height --      Head Circumference --      Peak Flow --      Pain Score 06/30/23 1611 4     Pain Loc --  Pain Education --      Exclude from Growth Chart --    No data found.  Updated Vital Signs BP (!) 147/88 (BP Location: Right Arm)   Pulse 77   Temp 98.4 F (36.9 C) (Oral)   Resp 18   SpO2 90%   Visual Acuity Right Eye Distance:   Left Eye Distance:   Bilateral Distance:    Right Eye Near:   Left Eye Near:    Bilateral Near:     Physical Exam Vitals and nursing note reviewed.  Constitutional:      General: He is not in acute distress.    Appearance: Normal appearance. He is not toxic-appearing.  HENT:     Head: Normocephalic and atraumatic.     Mouth/Throat:     Mouth: Mucous membranes are moist.     Pharynx: Oropharynx is clear.  Cardiovascular:     Rate and Rhythm: Normal rate.  Pulmonary:     Effort: Pulmonary effort is normal. No respiratory distress.  Musculoskeletal:     Right lower leg: Tenderness present. No deformity. 2+ Edema present.     Left lower leg: No deformity  or tenderness. No edema.     Right foot: Normal capillary refill. Normal pulse.     Left foot: Normal capillary refill. Normal pulse.     Comments: Right lower extremity diffusely erythematous, blanches with palpation.  Negative Homans' sign.  Skin:    Capillary Refill: Capillary refill takes less than 2 seconds.     Coloration: Skin is not jaundiced or pale.     Findings: Erythema present.  Neurological:     Mental Status: He is alert and oriented to person, place, and time.  Psychiatric:        Behavior: Behavior is cooperative.      UC Treatments / Results  Labs (all labs ordered are listed, but only abnormal results are displayed) Labs Reviewed - No data to display  EKG   Radiology No results found.  Procedures Procedures (including critical care time)  Medications Ordered in UC Medications - No data to display  Initial Impression / Assessment and Plan / UC Course  I have reviewed the triage vital signs and the nursing notes.  Pertinent labs & imaging results that were available during my care of the patient were reviewed by me and considered in my medical decision making (see chart for details).   Patient is well-appearing, normotensive, afebrile, not tachycardic, not tachypneic, oxygenating well on room air.    1. Localized swelling of right lower extremity 2. Cellulitis of right lower extremity Suspicious for cellulitis Treat with Keflex 500 mg 4 times daily for 5 days; recent kidney function normal Continue all current prescribed medications, recommended compression stockings Strict ER and return precautions discussed with patient  The patient was given the opportunity to ask questions.  All questions answered to their satisfaction.  The patient is in agreement to this plan.    Final Clinical Impressions(s) / UC Diagnoses   Final diagnoses:  Localized swelling of right lower extremity     Discharge Instructions      Take the Keflex as prescribed to  treat for infection in the skin of your leg.  If symptoms do not improve significantly towards the middle of the week, please follow-up with your primary care provider or return here to be seen.  If you develop chest pain, shortness of breath, fever, or heart racing, please seek care emergently.   ED Prescriptions  Medication Sig Dispense Auth. Provider   cephALEXin (KEFLEX) 500 MG capsule Take 1 capsule (500 mg total) by mouth 4 (four) times daily for 5 days. 20 capsule Valentino Nose, NP      PDMP not reviewed this encounter.   Valentino Nose, NP 06/30/23 [phone]

## 2023-07-03 DIAGNOSIS — C439 Malignant melanoma of skin, unspecified: Secondary | ICD-10-CM | POA: Diagnosis not present

## 2023-07-03 DIAGNOSIS — C7931 Secondary malignant neoplasm of brain: Secondary | ICD-10-CM | POA: Diagnosis not present

## 2023-07-07 DIAGNOSIS — H353221 Exudative age-related macular degeneration, left eye, with active choroidal neovascularization: Secondary | ICD-10-CM | POA: Diagnosis not present

## 2023-07-08 DIAGNOSIS — L0390 Cellulitis, unspecified: Secondary | ICD-10-CM | POA: Diagnosis not present

## 2023-07-09 ENCOUNTER — Emergency Department (HOSPITAL_COMMUNITY)
Admission: EM | Admit: 2023-07-09 | Discharge: 2023-07-09 | Disposition: A | Discharge status: Home / Self Care | Payer: Medicare Other | Attending: Emergency Medicine | Admitting: Emergency Medicine

## 2023-07-09 ENCOUNTER — Emergency Department (HOSPITAL_COMMUNITY): Payer: Medicare Other

## 2023-07-09 ENCOUNTER — Other Ambulatory Visit: Payer: Self-pay

## 2023-07-09 DIAGNOSIS — L03115 Cellulitis of right lower limb: Secondary | ICD-10-CM | POA: Insufficient documentation

## 2023-07-09 DIAGNOSIS — Z7984 Long term (current) use of oral hypoglycemic drugs: Secondary | ICD-10-CM | POA: Diagnosis not present

## 2023-07-09 DIAGNOSIS — Z79899 Other long term (current) drug therapy: Secondary | ICD-10-CM | POA: Insufficient documentation

## 2023-07-09 DIAGNOSIS — I10 Essential (primary) hypertension: Secondary | ICD-10-CM | POA: Insufficient documentation

## 2023-07-09 DIAGNOSIS — M79604 Pain in right leg: Secondary | ICD-10-CM | POA: Diagnosis not present

## 2023-07-09 DIAGNOSIS — I2510 Atherosclerotic heart disease of native coronary artery without angina pectoris: Secondary | ICD-10-CM | POA: Insufficient documentation

## 2023-07-09 DIAGNOSIS — Z85820 Personal history of malignant melanoma of skin: Secondary | ICD-10-CM | POA: Insufficient documentation

## 2023-07-09 DIAGNOSIS — Z8673 Personal history of transient ischemic attack (TIA), and cerebral infarction without residual deficits: Secondary | ICD-10-CM | POA: Insufficient documentation

## 2023-07-09 DIAGNOSIS — E119 Type 2 diabetes mellitus without complications: Secondary | ICD-10-CM | POA: Diagnosis not present

## 2023-07-09 DIAGNOSIS — M7989 Other specified soft tissue disorders: Secondary | ICD-10-CM | POA: Diagnosis not present

## 2023-07-09 NOTE — Discharge Instructions (Signed)
No evidence of blood clot in the legs.  Continue your antibiotics.  Follow-up with your doctors.

## 2023-07-09 NOTE — ED Provider Notes (Signed)
Doppler study negative for DVT on the right side.  Patient will continue his treatment for cellulitis and follow-up with his doctors.   Vanetta Mulders, MD 07/09/23 [phone]

## 2023-07-09 NOTE — ED Provider Notes (Signed)
[facility] AT [facility] Provider Note   CSN: [id number] Arrival date & time: 07/09/23  0541     History  No chief complaint on file.   Arthur Lutz is a 82 y.o. male.  HPI     This is an 82 year old male with a history of metastatic melanoma who presents with right leg pain and swelling.  He was seen and evaluated 1 week ago and treated for cellulitis by urgent care.  He states he has had some persistent discomfort.  No systemic symptoms such as fever.  He was seen at another urgent care yesterday and was told to follow-up with his primary doctor to rule out a blood clot.  He is not having any chest pain or shortness of breath.  No history of blood clots.  Home Medications Prior to Admission medications   Medication Sig Start Date End Date Taking? Authorizing Provider  acetaminophen (TYLENOL) 650 MG CR tablet Take 1,300 mg by mouth every 8 (eight) hours as needed for pain.    [provider]  amLODipine (NORVASC) 10 MG tablet TAKE (1) TABLET BY MOUTH ONCE DAILY. 04/01/23   Marykay Lex, MD  Apremilast (OTEZLA) 30 MG TABS Take as directed 05/23/22   [provider]  azelastine (ASTELIN) 0.1 % nasal spray Place 1 spray into the nose daily as needed (allergies).    [provider]  clobetasol (TEMOVATE) 0.05 % external solution Apply 1 application topically daily as needed (irritation). 03/01/19   [provider]  Coenzyme Q10 (COQ10) 100 MG CAPS Take 100 mg by mouth daily.    [provider]  EPINEPHrine 0.3 mg/0.3 mL IJ SOAJ injection Inject 0.3 mg into the muscle as needed for anaphylaxis. 12/25/18   [provider]  ezetimibe (ZETIA) 10 MG tablet Take 10 mg by mouth daily.    [provider]  furosemide (LASIX) 20 MG tablet Take 1 tablet (20 mg total) by mouth daily as needed. An may take an additional 20 mg if needed 09/03/22   Marykay Lex, MD  gabapentin (NEURONTIN) 100 MG capsule  Take 100 mg by mouth at bedtime as needed (pain).    [provider]  hydrALAZINE (APRESOLINE) 50 MG tablet TAKE (1) TABLET BY MOUTH (3) TIMES DAILY. 12/02/22   Marykay Lex, MD  isosorbide mononitrate (IMDUR) 60 MG 24 hr tablet TAKE (1) TABLET BY MOUTH ONCE DAILY. 11/13/22   Marykay Lex, MD  lisinopril (ZESTRIL) 40 MG tablet TAKE 1/2 TABLET BY MOUTH ONCE DAILY. 07/10/22   Marykay Lex, MD  loperamide (IMODIUM) 2 MG capsule Take 1 capsule (2 mg total) by mouth 4 (four) times daily as needed for diarrhea or loose stools. 03/30/19   Sabas Sous, MD  LOTEMAX 0.5 % OINT Place 1 application into the left eye daily as needed (irritation, dry eyes). 02/15/21   [provider]  magnesium gluconate (MAGONATE) 500 MG tablet Take 500 mg by mouth daily.    [provider]  meloxicam (MOBIC) 15 MG tablet Take 15 mg by mouth daily as needed for pain.    [provider]  Naltrexone HCl, Pain, (NALTREX) 4.5 MG CAPS Take 4.5 mg by mouth every evening.    [provider]  naproxen sodium (ALEVE) 220 MG tablet Take 440 mg by mouth 2 (two) times daily as needed (pain).    [provider]  OVER THE COUNTER MEDICATION Take 2 Capfuls by mouth daily. HistaQuel (  Histamine)    [provider]  OVER THE COUNTER MEDICATION Take 1 capsule by mouth daily. Blood Sugar Formula    [provider]  prednisoLONE acetate (PRED FORTE) 1 % ophthalmic suspension Place 1 drop into both eyes 2 (two) times daily as needed (irritation). 03/08/21   [provider]  predniSONE (DELTASONE) 10 MG tablet TAKE AS DIRECTED AS NEEDED FOR ARTHRITIS. TAKE 3 TABLETS FOR 2 DAYS, THEN 2 TABLETS FOR 1 DAY, THEN 1 TABLET FOR 1 DAY. 01/16/23   Marykay Lex, MD  simvastatin (ZOCOR) 20 MG tablet TAKE (1) TABLET BY MOUTH AT BEDTIME. 10/03/22   Marykay Lex, MD  spironolactone (ALDACTONE) 25 MG tablet Take 1 tablet (25 mg total) by mouth daily. 09/03/22   Marykay Lex, MD  tobramycin (TOBREX) 0.3 % ophthalmic solution Place 1 drop into both eyes daily as needed (before, during and after eye injection). 02/20/21   [provider]  triazolam (HALCION) 0.125 MG tablet Take 1.5 tablets (0.1875 mg total) by mouth at bedtime. Pt takes 1.5 tabs at bedtime Patient taking differently: Take 0.25 mg by mouth at bedtime. Pt takes 2 tabs at bedtime 08/29/17   Marykay Lex, MD      Allergies    Apremilast, Aspirin, Leflunomide, Methotrexate, Rosuvastatin calcium, and Other    Review of Systems   Review of Systems  Constitutional:  Negative for fever.  Respiratory:  Negative for shortness of breath.   Cardiovascular:  Positive for leg swelling. Negative for chest pain.  All other systems reviewed and are negative.   Physical Exam Updated Vital Signs BP (!) 161/70 (BP Location: Left Arm)   Pulse 70   Temp 97.6 F (36.4 C) (Oral)   Resp 19   Wt 112 kg   SpO2 92%   BMI 33.50 kg/m  Physical Exam Vitals and nursing note reviewed.  Constitutional:      Appearance: He is well-developed. He is obese. He is not ill-appearing.  HENT:     Head: Normocephalic and atraumatic.  Eyes:     Pupils: Pupils are equal, round, and reactive to light.  Cardiovascular:     Rate and Rhythm: Normal rate and regular rhythm.     Heart sounds: Normal heart sounds. No murmur heard. Pulmonary:     Effort: Pulmonary effort is normal. No respiratory distress.     Breath sounds: Normal breath sounds. No wheezing.  Abdominal:     Palpations: Abdomen is soft.     Tenderness: There is no abdominal tenderness.  Musculoskeletal:     Cervical back: Neck supple.     Comments: Tenderness to palpation with slight asymmetric swelling of the right calf, slight erythema noted, no extensive warmth or streaking  Lymphadenopathy:     Cervical: No cervical adenopathy.  Skin:    General: Skin is warm and dry.  Neurological:     Mental Status: He is alert and oriented to person,  place, and time.  Psychiatric:        Mood and Affect: Mood normal.     ED Results / Procedures / Treatments   Labs (all labs ordered are listed, but only abnormal results are displayed) Labs Reviewed - No data to display  EKG None  Radiology No results found.  Procedures Procedures    Medications Ordered in ED Medications - No data to display  ED Course/ Medical Decision Making/ A&P  Medical Decision Making  This patient presents to the ED for concern of right leg swelling and pain, this involves an extensive number of treatment options, and is a complaint that carries with it a high risk of complications and morbidity.  I considered the following differential and admission for this acute, potentially life threatening condition.  The differential diagnosis includes cellulitis, DVT, contusion  MDM:    This is AN 82 year old male who presents with concern for possible DVT.  Currently being treated for cellulitis with antibiotics.  History of metastatic melanoma.  He does have some asymmetric swelling.  Denies any systemic symptoms related to cellulitis such as fever.  Erythema is minimal at this point.  Will obtain ultrasound imaging.  (Labs, imaging, consults)  Labs: I Ordered, and personally interpreted labs.  The pertinent results include: None  Imaging Studies ordered: I ordered imaging studies including ultrasound pending I independently visualized and interpreted imaging. I agree with the radiologist interpretation  Additional history obtained from chart review.  External records from outside source obtained and reviewed including prior evaluations  Cardiac Monitoring: The patient was maintained on a cardiac monitor.  If on the cardiac monitor, I personally viewed and interpreted the cardiac monitored which showed an underlying rhythm of: Sinus  Reevaluation: After the interventions noted above, I reevaluated the patient and  found that they have :stayed the same  Social Determinants of Health:  lives independently  Disposition: Discharge  Co morbidities that complicate the patient evaluation  Past Medical History:  Diagnosis Date   Allergic rhinitis    Aortic valve sclerosis    Cause of murmur   Arthritis of knee, left, needs total Knee in future with Dr. Lequita Halt  08/07/2011   Bladder cancer (HCC) 11/2005   CAD S/P percutaneous coronary angioplasty 08/07/2011   NEW 08/07/11 cutting balloon PTCA to 95% ostial diag. and 90%  proximal LCX.  prior cutting balloon and to RCA  & ostium of ostial diag; Last Cath 02/2012 - Patent Diag PTCA, RCA & Circ BMS stents. Normal EF & EDP; Myoview 2/14 no evidence of ischemia or infarction   Diabetes mellitus type 2, controlled, with complications (HCC)    Dyslipidemia    History of heart attack 11/2007   5/'09: "mild; did not affect the heart muscle"; PCI to RCA, Cutting PTCA Diag ostium;   Hypertension    Macular degeneration    Macular degeneration of right eye    Now getting shots.    Melanoma of skin (HCC) ~ 2005   "level 2; on my back"   Osteoarthritis    Status post right knee arthroplasty, positive for staph infection. Pending right arthroplasty   Portacath in place 08/11/2012   Statin intolerance 08/07/2011   Stroke (HCC) 1980's   "had facial strokes; 2; about a year apart; never did find what caused them"     Medicines No orders of the defined types were placed in this encounter.   I have reviewed the patients home medicines and have made adjustments as needed  Problem List / ED Course: Problem List Items Addressed This Visit   None               Final Clinical Impression(s) / ED Diagnoses Final diagnoses:  None    Rx / DC Orders ED Discharge Orders     None         Shon Baton, MD 07/09/23 [phone]

## 2023-07-09 NOTE — ED Triage Notes (Signed)
Pt c/o pain and swelling to right leg, was seen at [facility] on 12/2 and dx with cellulitis and prescribed antibiotics. Pt reports that his family worried that he has a blood clot.

## 2023-07-09 NOTE — ED Notes (Signed)
See triage notes. Redness and swelling noted to r lower leg. No increased warmth noted. Pedal pulses present.

## 2023-07-10 DIAGNOSIS — C439 Malignant melanoma of skin, unspecified: Secondary | ICD-10-CM | POA: Diagnosis not present

## 2023-07-10 DIAGNOSIS — C4359 Malignant melanoma of other part of trunk: Secondary | ICD-10-CM | POA: Diagnosis not present

## 2023-07-10 DIAGNOSIS — C7931 Secondary malignant neoplasm of brain: Secondary | ICD-10-CM | POA: Diagnosis not present

## 2023-07-14 ENCOUNTER — Other Ambulatory Visit: Payer: Self-pay | Admitting: Cardiology

## 2023-07-21 DIAGNOSIS — C4359 Malignant melanoma of other part of trunk: Secondary | ICD-10-CM | POA: Diagnosis not present

## 2023-07-21 DIAGNOSIS — Z87891 Personal history of nicotine dependence: Secondary | ICD-10-CM | POA: Diagnosis not present

## 2023-07-21 DIAGNOSIS — C439 Malignant melanoma of skin, unspecified: Secondary | ICD-10-CM | POA: Diagnosis not present

## 2023-07-21 DIAGNOSIS — Z5111 Encounter for antineoplastic chemotherapy: Secondary | ICD-10-CM | POA: Diagnosis not present

## 2023-07-21 DIAGNOSIS — C7931 Secondary malignant neoplasm of brain: Secondary | ICD-10-CM | POA: Diagnosis not present

## 2023-07-21 DIAGNOSIS — T451X5A Adverse effect of antineoplastic and immunosuppressive drugs, initial encounter: Secondary | ICD-10-CM | POA: Diagnosis not present

## 2023-07-21 DIAGNOSIS — L2989 Other pruritus: Secondary | ICD-10-CM | POA: Diagnosis not present

## 2023-07-21 DIAGNOSIS — L03115 Cellulitis of right lower limb: Secondary | ICD-10-CM | POA: Diagnosis not present

## 2023-07-21 DIAGNOSIS — Z5112 Encounter for antineoplastic immunotherapy: Secondary | ICD-10-CM | POA: Diagnosis not present

## 2023-08-01 DIAGNOSIS — J069 Acute upper respiratory infection, unspecified: Secondary | ICD-10-CM | POA: Diagnosis not present

## 2023-08-05 DIAGNOSIS — R059 Cough, unspecified: Secondary | ICD-10-CM | POA: Diagnosis not present

## 2023-08-05 DIAGNOSIS — J22 Unspecified acute lower respiratory infection: Secondary | ICD-10-CM | POA: Diagnosis not present

## 2023-08-05 DIAGNOSIS — R509 Fever, unspecified: Secondary | ICD-10-CM | POA: Diagnosis not present

## 2023-08-07 DIAGNOSIS — B351 Tinea unguium: Secondary | ICD-10-CM | POA: Diagnosis not present

## 2023-08-07 DIAGNOSIS — E1142 Type 2 diabetes mellitus with diabetic polyneuropathy: Secondary | ICD-10-CM | POA: Diagnosis not present

## 2023-08-20 DIAGNOSIS — R197 Diarrhea, unspecified: Secondary | ICD-10-CM | POA: Diagnosis not present

## 2023-08-20 DIAGNOSIS — C7931 Secondary malignant neoplasm of brain: Secondary | ICD-10-CM | POA: Diagnosis not present

## 2023-08-20 DIAGNOSIS — M2550 Pain in unspecified joint: Secondary | ICD-10-CM | POA: Diagnosis not present

## 2023-08-20 DIAGNOSIS — C4359 Malignant melanoma of other part of trunk: Secondary | ICD-10-CM | POA: Diagnosis not present

## 2023-08-20 DIAGNOSIS — Z5112 Encounter for antineoplastic immunotherapy: Secondary | ICD-10-CM | POA: Diagnosis not present

## 2023-08-20 DIAGNOSIS — L2989 Other pruritus: Secondary | ICD-10-CM | POA: Diagnosis not present

## 2023-08-20 DIAGNOSIS — R0981 Nasal congestion: Secondary | ICD-10-CM | POA: Diagnosis not present

## 2023-08-20 DIAGNOSIS — Z87891 Personal history of nicotine dependence: Secondary | ICD-10-CM | POA: Diagnosis not present

## 2023-08-20 DIAGNOSIS — Z79899 Other long term (current) drug therapy: Secondary | ICD-10-CM | POA: Diagnosis not present

## 2023-08-20 DIAGNOSIS — R0602 Shortness of breath: Secondary | ICD-10-CM | POA: Diagnosis not present

## 2023-08-20 DIAGNOSIS — M1990 Unspecified osteoarthritis, unspecified site: Secondary | ICD-10-CM | POA: Diagnosis not present

## 2023-08-20 DIAGNOSIS — L299 Pruritus, unspecified: Secondary | ICD-10-CM | POA: Diagnosis not present

## 2023-08-20 DIAGNOSIS — T50995A Adverse effect of other drugs, medicaments and biological substances, initial encounter: Secondary | ICD-10-CM | POA: Diagnosis not present

## 2023-08-20 DIAGNOSIS — R5383 Other fatigue: Secondary | ICD-10-CM | POA: Diagnosis not present

## 2023-08-20 DIAGNOSIS — T451X5A Adverse effect of antineoplastic and immunosuppressive drugs, initial encounter: Secondary | ICD-10-CM | POA: Diagnosis not present

## 2023-08-20 DIAGNOSIS — L03115 Cellulitis of right lower limb: Secondary | ICD-10-CM | POA: Diagnosis not present

## 2023-08-20 DIAGNOSIS — C4330 Malignant melanoma of unspecified part of face: Secondary | ICD-10-CM | POA: Diagnosis not present

## 2023-08-25 DIAGNOSIS — C439 Malignant melanoma of skin, unspecified: Secondary | ICD-10-CM | POA: Diagnosis not present

## 2023-08-27 DIAGNOSIS — H353221 Exudative age-related macular degeneration, left eye, with active choroidal neovascularization: Secondary | ICD-10-CM | POA: Diagnosis not present

## 2023-09-08 DIAGNOSIS — R590 Localized enlarged lymph nodes: Secondary | ICD-10-CM | POA: Diagnosis not present

## 2023-09-08 DIAGNOSIS — R5381 Other malaise: Secondary | ICD-10-CM | POA: Diagnosis not present

## 2023-09-08 DIAGNOSIS — C439 Malignant melanoma of skin, unspecified: Secondary | ICD-10-CM | POA: Diagnosis not present

## 2023-09-08 DIAGNOSIS — K862 Cyst of pancreas: Secondary | ICD-10-CM | POA: Diagnosis not present

## 2023-09-08 DIAGNOSIS — G9389 Other specified disorders of brain: Secondary | ICD-10-CM | POA: Diagnosis not present

## 2023-09-08 DIAGNOSIS — C7931 Secondary malignant neoplasm of brain: Secondary | ICD-10-CM | POA: Diagnosis not present

## 2023-09-08 DIAGNOSIS — K1379 Other lesions of oral mucosa: Secondary | ICD-10-CM | POA: Diagnosis not present

## 2023-09-08 DIAGNOSIS — K869 Disease of pancreas, unspecified: Secondary | ICD-10-CM | POA: Diagnosis not present

## 2023-09-14 DIAGNOSIS — Z6831 Body mass index (BMI) 31.0-31.9, adult: Secondary | ICD-10-CM | POA: Diagnosis not present

## 2023-09-14 DIAGNOSIS — M25532 Pain in left wrist: Secondary | ICD-10-CM | POA: Diagnosis not present

## 2023-09-14 DIAGNOSIS — M25531 Pain in right wrist: Secondary | ICD-10-CM | POA: Diagnosis not present

## 2023-09-14 DIAGNOSIS — E669 Obesity, unspecified: Secondary | ICD-10-CM | POA: Diagnosis not present

## 2023-09-15 DIAGNOSIS — K1379 Other lesions of oral mucosa: Secondary | ICD-10-CM | POA: Diagnosis not present

## 2023-09-15 DIAGNOSIS — E119 Type 2 diabetes mellitus without complications: Secondary | ICD-10-CM | POA: Diagnosis not present

## 2023-09-15 DIAGNOSIS — M25519 Pain in unspecified shoulder: Secondary | ICD-10-CM | POA: Diagnosis not present

## 2023-09-15 DIAGNOSIS — L4050 Arthropathic psoriasis, unspecified: Secondary | ICD-10-CM | POA: Diagnosis not present

## 2023-09-15 DIAGNOSIS — Z9689 Presence of other specified functional implants: Secondary | ICD-10-CM | POA: Diagnosis not present

## 2023-09-15 DIAGNOSIS — Z87891 Personal history of nicotine dependence: Secondary | ICD-10-CM | POA: Diagnosis not present

## 2023-09-15 DIAGNOSIS — R197 Diarrhea, unspecified: Secondary | ICD-10-CM | POA: Diagnosis not present

## 2023-09-15 DIAGNOSIS — Z5111 Encounter for antineoplastic chemotherapy: Secondary | ICD-10-CM | POA: Diagnosis not present

## 2023-09-15 DIAGNOSIS — L299 Pruritus, unspecified: Secondary | ICD-10-CM | POA: Diagnosis not present

## 2023-09-15 DIAGNOSIS — R21 Rash and other nonspecific skin eruption: Secondary | ICD-10-CM | POA: Diagnosis not present

## 2023-09-15 DIAGNOSIS — M25529 Pain in unspecified elbow: Secondary | ICD-10-CM | POA: Diagnosis not present

## 2023-09-15 DIAGNOSIS — L03115 Cellulitis of right lower limb: Secondary | ICD-10-CM | POA: Diagnosis not present

## 2023-09-15 DIAGNOSIS — R0981 Nasal congestion: Secondary | ICD-10-CM | POA: Diagnosis not present

## 2023-09-15 DIAGNOSIS — K1230 Oral mucositis (ulcerative), unspecified: Secondary | ICD-10-CM | POA: Diagnosis not present

## 2023-09-15 DIAGNOSIS — K1231 Oral mucositis (ulcerative) due to antineoplastic therapy: Secondary | ICD-10-CM | POA: Diagnosis not present

## 2023-09-15 DIAGNOSIS — Z5112 Encounter for antineoplastic immunotherapy: Secondary | ICD-10-CM | POA: Diagnosis not present

## 2023-09-15 DIAGNOSIS — C7931 Secondary malignant neoplasm of brain: Secondary | ICD-10-CM | POA: Diagnosis not present

## 2023-09-15 DIAGNOSIS — C439 Malignant melanoma of skin, unspecified: Secondary | ICD-10-CM | POA: Diagnosis not present

## 2023-09-15 DIAGNOSIS — R590 Localized enlarged lymph nodes: Secondary | ICD-10-CM | POA: Diagnosis not present

## 2023-09-24 DIAGNOSIS — C4330 Malignant melanoma of unspecified part of face: Secondary | ICD-10-CM | POA: Diagnosis not present

## 2023-09-24 DIAGNOSIS — K1231 Oral mucositis (ulcerative) due to antineoplastic therapy: Secondary | ICD-10-CM | POA: Diagnosis not present

## 2023-10-08 DIAGNOSIS — T45AX5S Adverse effect of immune checkpoint inhibitors and immunostimulant drugs, sequela: Secondary | ICD-10-CM | POA: Diagnosis not present

## 2023-10-08 DIAGNOSIS — C7931 Secondary malignant neoplasm of brain: Secondary | ICD-10-CM | POA: Diagnosis not present

## 2023-10-08 DIAGNOSIS — C439 Malignant melanoma of skin, unspecified: Secondary | ICD-10-CM | POA: Diagnosis not present

## 2023-10-08 DIAGNOSIS — K1231 Oral mucositis (ulcerative) due to antineoplastic therapy: Secondary | ICD-10-CM | POA: Diagnosis not present

## 2023-10-09 DIAGNOSIS — C672 Malignant neoplasm of lateral wall of bladder: Secondary | ICD-10-CM | POA: Diagnosis not present

## 2023-10-15 DIAGNOSIS — H353221 Exudative age-related macular degeneration, left eye, with active choroidal neovascularization: Secondary | ICD-10-CM | POA: Diagnosis not present

## 2023-10-15 DIAGNOSIS — L4050 Arthropathic psoriasis, unspecified: Secondary | ICD-10-CM | POA: Diagnosis not present

## 2023-10-22 DIAGNOSIS — C4330 Malignant melanoma of unspecified part of face: Secondary | ICD-10-CM | POA: Diagnosis not present

## 2023-10-22 DIAGNOSIS — T45AX5S Adverse effect of immune checkpoint inhibitors and immunostimulant drugs, sequela: Secondary | ICD-10-CM | POA: Diagnosis not present

## 2023-10-22 DIAGNOSIS — K1379 Other lesions of oral mucosa: Secondary | ICD-10-CM | POA: Diagnosis not present

## 2023-10-22 DIAGNOSIS — K1231 Oral mucositis (ulcerative) due to antineoplastic therapy: Secondary | ICD-10-CM | POA: Diagnosis not present

## 2023-10-27 DIAGNOSIS — C439 Malignant melanoma of skin, unspecified: Secondary | ICD-10-CM | POA: Diagnosis not present

## 2023-10-27 DIAGNOSIS — C801 Malignant (primary) neoplasm, unspecified: Secondary | ICD-10-CM | POA: Diagnosis not present

## 2023-10-27 DIAGNOSIS — C7931 Secondary malignant neoplasm of brain: Secondary | ICD-10-CM | POA: Diagnosis not present

## 2023-11-03 DIAGNOSIS — C7931 Secondary malignant neoplasm of brain: Secondary | ICD-10-CM | POA: Diagnosis not present

## 2023-11-03 DIAGNOSIS — R5383 Other fatigue: Secondary | ICD-10-CM | POA: Diagnosis not present

## 2023-11-03 DIAGNOSIS — K1231 Oral mucositis (ulcerative) due to antineoplastic therapy: Secondary | ICD-10-CM | POA: Diagnosis not present

## 2023-11-03 DIAGNOSIS — C4330 Malignant melanoma of unspecified part of face: Secondary | ICD-10-CM | POA: Diagnosis not present

## 2023-11-05 DIAGNOSIS — L308 Other specified dermatitis: Secondary | ICD-10-CM | POA: Diagnosis not present

## 2023-11-05 DIAGNOSIS — T887XXA Unspecified adverse effect of drug or medicament, initial encounter: Secondary | ICD-10-CM | POA: Diagnosis not present

## 2023-11-05 DIAGNOSIS — C439 Malignant melanoma of skin, unspecified: Secondary | ICD-10-CM | POA: Diagnosis not present

## 2023-11-05 DIAGNOSIS — K121 Other forms of stomatitis: Secondary | ICD-10-CM | POA: Diagnosis not present

## 2023-11-05 DIAGNOSIS — D225 Melanocytic nevi of trunk: Secondary | ICD-10-CM | POA: Diagnosis not present

## 2023-11-05 DIAGNOSIS — D485 Neoplasm of uncertain behavior of skin: Secondary | ICD-10-CM | POA: Diagnosis not present

## 2023-11-05 DIAGNOSIS — L578 Other skin changes due to chronic exposure to nonionizing radiation: Secondary | ICD-10-CM | POA: Diagnosis not present

## 2023-11-10 DIAGNOSIS — M7731 Calcaneal spur, right foot: Secondary | ICD-10-CM | POA: Diagnosis not present

## 2023-11-10 DIAGNOSIS — M159 Polyosteoarthritis, unspecified: Secondary | ICD-10-CM | POA: Diagnosis not present

## 2023-11-10 DIAGNOSIS — M51369 Other intervertebral disc degeneration, lumbar region without mention of lumbar back pain or lower extremity pain: Secondary | ICD-10-CM | POA: Diagnosis not present

## 2023-11-10 DIAGNOSIS — M47814 Spondylosis without myelopathy or radiculopathy, thoracic region: Secondary | ICD-10-CM | POA: Diagnosis not present

## 2023-11-10 DIAGNOSIS — Z1159 Encounter for screening for other viral diseases: Secondary | ICD-10-CM | POA: Diagnosis not present

## 2023-11-10 DIAGNOSIS — C439 Malignant melanoma of skin, unspecified: Secondary | ICD-10-CM | POA: Diagnosis not present

## 2023-11-10 DIAGNOSIS — M47812 Spondylosis without myelopathy or radiculopathy, cervical region: Secondary | ICD-10-CM | POA: Diagnosis not present

## 2023-11-10 DIAGNOSIS — M47816 Spondylosis without myelopathy or radiculopathy, lumbar region: Secondary | ICD-10-CM | POA: Diagnosis not present

## 2023-11-10 DIAGNOSIS — Z79899 Other long term (current) drug therapy: Secondary | ICD-10-CM | POA: Diagnosis not present

## 2023-11-10 DIAGNOSIS — T45AX5A Adverse effect of immune checkpoint inhibitors and immunostimulant drugs, initial encounter: Secondary | ICD-10-CM | POA: Diagnosis not present

## 2023-11-10 DIAGNOSIS — M50321 Other cervical disc degeneration at C4-C5 level: Secondary | ICD-10-CM | POA: Diagnosis not present

## 2023-11-10 DIAGNOSIS — L4050 Arthropathic psoriasis, unspecified: Secondary | ICD-10-CM | POA: Diagnosis not present

## 2023-11-10 DIAGNOSIS — M7732 Calcaneal spur, left foot: Secondary | ICD-10-CM | POA: Diagnosis not present

## 2023-11-10 DIAGNOSIS — M8589 Other specified disorders of bone density and structure, multiple sites: Secondary | ICD-10-CM | POA: Diagnosis not present

## 2023-11-24 DIAGNOSIS — C439 Malignant melanoma of skin, unspecified: Secondary | ICD-10-CM | POA: Diagnosis not present

## 2023-11-27 DIAGNOSIS — K1379 Other lesions of oral mucosa: Secondary | ICD-10-CM | POA: Diagnosis not present

## 2023-12-02 ENCOUNTER — Ambulatory Visit: Attending: Cardiology | Admitting: Cardiology

## 2023-12-02 ENCOUNTER — Encounter: Payer: Self-pay | Admitting: Cardiology

## 2023-12-02 VITALS — BP 142/78 | HR 82 | Ht 72.0 in | Wt 238.4 lb

## 2023-12-02 DIAGNOSIS — Z9861 Coronary angioplasty status: Secondary | ICD-10-CM

## 2023-12-02 DIAGNOSIS — I878 Other specified disorders of veins: Secondary | ICD-10-CM

## 2023-12-02 DIAGNOSIS — I2089 Other forms of angina pectoris: Secondary | ICD-10-CM

## 2023-12-02 DIAGNOSIS — E785 Hyperlipidemia, unspecified: Secondary | ICD-10-CM

## 2023-12-02 DIAGNOSIS — G720 Drug-induced myopathy: Secondary | ICD-10-CM

## 2023-12-02 DIAGNOSIS — E1169 Type 2 diabetes mellitus with other specified complication: Secondary | ICD-10-CM

## 2023-12-02 DIAGNOSIS — I10 Essential (primary) hypertension: Secondary | ICD-10-CM | POA: Diagnosis not present

## 2023-12-02 DIAGNOSIS — I2510 Atherosclerotic heart disease of native coronary artery without angina pectoris: Secondary | ICD-10-CM | POA: Diagnosis not present

## 2023-12-02 DIAGNOSIS — T466X5D Adverse effect of antihyperlipidemic and antiarteriosclerotic drugs, subsequent encounter: Secondary | ICD-10-CM

## 2023-12-02 DIAGNOSIS — E66811 Obesity, class 1: Secondary | ICD-10-CM

## 2023-12-02 MED ORDER — ATORVASTATIN CALCIUM 40 MG PO TABS: 40.0000 mg | ORAL_TABLET | Freq: Every day | ORAL | 3 refills | Status: AC

## 2023-12-02 NOTE — Progress Notes (Signed)
 Cardiology Office Note:  .   Date:  12/08/2023  ID:  Arthur Lutz, DOB [birth year], MRN [id number] PCP: Kathyleen Parkins, MD  [facility] Providers Cardiologist:  Randene Bustard, MD     Chief Complaint  Patient presents with   Follow-up    Doing well.  No major issues.  Recently treated for recurrent melanoma    Patient Profile: .     Arthur Lutz is an obese 83 y.o. male with a PMH notable for CAD (PTCA ostial D2 2009 in 2013, BMS PCI to RCA 2009, DES PCI to LCx in 2013), HTN, HLD and DM-2 with Chronic Venous Stasis who presents here for delayed annual follow-up.  Referred at the request of Kathyleen Parkins, MD.     Arthur Lutz was last seen on September 03, 2022 for routine follow-up doing well.  No active anginal symptoms.  Edema controlled-taking Lasix  almost every day but not additional doses..  Noted that he was not as energetic as he had been, but denied any exertional dyspnea or chest pain.  No medication changes made.  PRN prednisone  for arthritis pains ordered along with daily Lasix  and spironolactone .  Subjective  Discussed the use of AI scribe software for clinical note transcription with the patient, who gave verbal consent to proceed.  History of Present Illness History of Present Illness Arthur Lutz "Arthur Lutz" is an 83 year old male with history of CAD with recently diagnosed/treated Recurrent melanoma who presents for follow-up of his condition.  He has a history of coronary artery disease with history of PTCA/PCI.   He continues to work daily, which he believes keeps his mind and body active.  He has not had any issues at all with chest pain or pressure with rest or exertion.  No rapid irregular beats palpitations.  No syncope or near syncope.  His recent cholesterol levels were not at the desired target standing dose of simvastatin  40 mg and Zetia , and he has experienced cramps with rosuvastatin in the past.   He denies any PND or orthopnea, but he does  note swelling in his legs, particularly at the end of the day, with the right leg being more affected. He takes furosemide  daily to manage this swelling. The swelling is influenced by the type of socks he wears and his diet.  No blood in stools, urine, or epistaxis.  He was diagnosed with recurrent melanoma in November after initially noticing a sensation in his back, which he attributed to lying on a rock. His caregiver found nothing significant, and a dermatologist decided to monitor it due to his history of melanoma. A month later, a biopsy confirmed melanoma, leading to immediate treatment. He has undergone immunotherapy and radiation for melanoma located at the back of his head. A PET scan and MRI identified melanoma in the brain, lung, and back of the head. Radiation treatment was successful in eliminating the spot in the back of his head, and follow-up MRIs have shown no signs of recurrence. He continues to receive regular scans every three months.  He has blisters on his lips, tongue, and mouth, which have been painful and affected his ability to eat. He was prescribed Valtrex 500 mg and a mouthwash, which have significantly improved his symptoms.     Objective   CV Medications: Amlodipine  10 mg daily, Imdur  60 mg daily, spironolactone  25 mg daily lisinopril  20 mg (1/2 tablet -40 mg) daily, hydralazine  50 mg 3 times daily; furosemide  20 mg daily;  Zetia  10 mg daily, simvastatin  40 mg daily 81 mg aspirin   Studies Reviewed: Aaron Aas   EKG Interpretation Date/Time:  Tuesday Dec 02 2023 15:15:09 EDT Ventricular Rate:  80 PR Interval:  182 QRS Duration:  138 QT Interval:  418 QTC Calculation: 482 R Axis:   23  Text Interpretation: Normal sinus rhythm Right bundle branch block When compared with ECG of 13-May-2021 18:15, No significant change was found Confirmed by Randene Bustard ([id number]) on 12/02/2023 4:05:26 PM    Lab Results  Component Value Date   CHOL 112 08/20/2021   HDL 36 (L)  08/20/2021   LDLCALC 56 08/20/2021   TRIG 105 08/20/2021   CHOLHDL 3.1 08/20/2021    LABS (from PCP via [facility]) 04/2023: LDL: 78; Total cholesterol: 134; Triglycerides: 78; HDL: 41; A1c 6.3; BUN/Cr 22/0.9.  TSH 1.92 Hgb 13.5  RADIOLOGY PET scan: Detected melanoma in previously identified locations Head MRI: Spot in the occipital bone, size of a pencil eraser, no signs of melanoma  DIAGNOSTIC Echocardiogram: Aortic calcification, soft murmur from turbulence across the aortic valve (2022)  PATHOLOGY Biopsy: Melanoma (05/2023)  Risk Assessment/Calculations:         Physical Exam:   VS:  BP (!) 142/78   Pulse 82   Ht 6' (1.829 m)   Wt 238 lb 6.4 oz (108.1 kg)   SpO2 90%   BMI 32.33 kg/m    BP on recheck was 138/76 Wt Readings from Last 3 Encounters:  12/02/23 238 lb 6.4 oz (108.1 kg)  07/09/23 247 lb (112 kg)  09/03/22 252 lb (114.3 kg)    GEN: Well nourished, well developed in no acute distress; mildly obese NECK: No JVD; No carotid bruits CARDIAC: RRR, (distant) normal S1, split S2; soft 1/6 SEM at RUSB but otherwise no additional murmurs, rubs, gallops RESPIRATORY:  Clear to auscultation without rales, wheezing or rhonchi ; nonlabored, good air movement. ABDOMEN: Soft, non-tender, non-distended EXTREMITIES: Trivial bilateral ankle edema (R> L); No deformity     ASSESSMENT AND PLAN: .    Problem List Items Addressed This Visit       Cardiology Problems   CAD S/P percutaneous coronary angioplasty: 08/07/11 cutting balloon PTCA to 95% ostial diag. and 90%  proximal LCX.  prior cutting balloon to RCA  & ostium of ostial diag., 2009  - Primary (Chronic)   Doing well without any active anginal symptoms. Myoview  in February 2023 was nonischemic. Not on beta-blocker because of bradycardia and fatigue Continue current dose of amlodipine  10 mg daily and Imdur  60mg  daily for antianginal benefit Continue aspirin  81 mg daily along with Zetia  10 mg daily but switch from simvastatin   to atorvastatin  40 mg daily       Relevant Medications   atorvastatin  (LIPITOR) 40 MG tablet   Chronic stable angina (HCC) - negative Myoview  & no new Dz on Cath (Chronic)   Chronic stable angina with negative Myoview  in 2022.  No new disease noted on cath as well at that time.  Continue medical management with amlodipine  10 Miller daily and Imdur  60 mg daily.  Doing well.  Only notes symptoms if he overexerts, which has not occurred recently.      Relevant Medications   atorvastatin  (LIPITOR) 40 MG tablet   Essential hypertension (Chronic)   Mildly elevated blood pressure today, otherwise pretty stable on current dose of amlodipine  10 mg daily, lisinopril  20 mg daily and hydralazine  50 mg 3 times daily plus spironolactone  25 mg daily. - Low threshold to titrate  him back to 40 mg lisinopril .  (However at higher doses ETS orthostasis) Would potentially allow for mild permissive hypertension.      Relevant Medications   atorvastatin  (LIPITOR) 40 MG tablet   Other Relevant Orders   EKG 12-Lead (Completed)   Hyperlipidemia associated with type 2 diabetes mellitus (HCC) (Chronic)   Most recent LDL was 78 with a target<55: - Continue Zetia  with low threshold to convert to Nexlizet  - Convert from simvastatin  to atorvastatin  40 mg and monitor for arthralgias..  Most recent A1c is 6.3.  Well-controlled-defer management to PCP.      Relevant Medications   atorvastatin  (LIPITOR) 40 MG tablet   Other Relevant Orders   EKG 12-Lead (Completed)   Venous stasis of both lower extremities (Chronic)   Mild edema in right leg, managed with furosemide . - Continue furosemide  daily. - Advise extra furosemide  dose if weight increases by 3 pounds in a day or more than 5 pounds in a week, or if swelling worsens. - Consider compression stockings above the knee.      Relevant Medications   atorvastatin  (LIPITOR) 40 MG tablet     Other   Obesity (BMI 30.0-34.9) (Chronic)   The patient understands  the need to lose weight with diet and exercise. We have discussed specific strategies for this. Question if he would potentially be a candidate for GLP-1 agonist if A1c is higher.      Statin myopathy (Chronic)   Notable myalgias on rosuvastatin.  Has been tolerant of simvastatin , but not at goal.  Will rechallenge with atorvastatin .            Follow-Up: Return in about 11 months (around 11/01/2024) for [employer].    Signed, Arleen Lacer, MD, MS Randene Bustard, M.D., M.S. Interventional Chartered certified accountant  Pager # [phone]

## 2023-12-02 NOTE — Patient Instructions (Addendum)
 Medication Instructions:   Stop taking Simvastatin    Start taking Atorvastatin  40 mg daily    If you have worsening swelling or weight gain of 3 lb or more over night  take an extra dose Furosemide     *If you need a refill on your cardiac medications before your next appointment, please call your pharmacy*   Lab Work: Not needed    Testing/Procedures:  Not needed  Follow-Up: At [facility], you and your health needs are our priority.  As part of our continuing mission to provide you with exceptional heart care, we have created designated Provider Care Teams.  These Care Teams include your primary Cardiologist (physician) and Advanced Practice Providers (APPs -  Physician Assistants and Nurse Practitioners) who all work together to provide you with the care you need, when you need it.     Your next appointment:   10 to 11 month(s)  The format for your next appointment:   In Person  Provider:   Randene Bustard, MD

## 2023-12-03 DIAGNOSIS — H353221 Exudative age-related macular degeneration, left eye, with active choroidal neovascularization: Secondary | ICD-10-CM | POA: Diagnosis not present

## 2023-12-06 ENCOUNTER — Other Ambulatory Visit: Payer: Self-pay | Admitting: Cardiology

## 2023-12-08 ENCOUNTER — Encounter: Payer: Self-pay | Admitting: Cardiology

## 2023-12-08 NOTE — Assessment & Plan Note (Addendum)
 Mild edema in right leg, managed with furosemide . - Continue furosemide  daily. - Advise extra furosemide  dose if weight increases by 3 pounds in a day or more than 5 pounds in a week, or if swelling worsens. - Consider compression stockings above the knee.

## 2023-12-08 NOTE — Assessment & Plan Note (Signed)
 Mildly elevated blood pressure today, otherwise pretty stable on current dose of amlodipine  10 mg daily, lisinopril  20 mg daily and hydralazine  50 mg 3 times daily plus spironolactone  25 mg daily. - Low threshold to titrate him back to 40 mg lisinopril .  (However at higher doses ETS orthostasis) Would potentially allow for mild permissive hypertension.

## 2023-12-08 NOTE — Assessment & Plan Note (Signed)
 Notable myalgias on rosuvastatin.  Has been tolerant of simvastatin , but not at goal.  Will rechallenge with atorvastatin .

## 2023-12-08 NOTE — Assessment & Plan Note (Addendum)
 Most recent LDL was 78 with a target<55: - Continue Zetia  with low threshold to convert to Nexlizet  - Convert from simvastatin  to atorvastatin  40 mg and monitor for arthralgias..  Most recent A1c is 6.3.  Well-controlled-defer management to PCP.

## 2023-12-08 NOTE — Assessment & Plan Note (Signed)
 Doing well without any active anginal symptoms. Myoview  in February 2023 was nonischemic. Not on beta-blocker because of bradycardia and fatigue Continue current dose of amlodipine  10 mg daily and Imdur  60mg  daily for antianginal benefit Continue aspirin  81 mg daily along with Zetia  10 mg daily but switch from simvastatin  to atorvastatin  40 mg daily

## 2023-12-08 NOTE — Assessment & Plan Note (Signed)
 Chronic stable angina with negative Myoview  in 2022.  No new disease noted on cath as well at that time.  Continue medical management with amlodipine  10 Miller daily and Imdur  60 mg daily.  Doing well.  Only notes symptoms if he overexerts, which has not occurred recently.

## 2023-12-08 NOTE — Assessment & Plan Note (Signed)
 The patient understands the need to lose weight with diet and exercise. We have discussed specific strategies for this. Question if he would potentially be a candidate for GLP-1 agonist if A1c is higher.

## 2023-12-09 DIAGNOSIS — C7931 Secondary malignant neoplasm of brain: Secondary | ICD-10-CM | POA: Diagnosis not present

## 2023-12-09 DIAGNOSIS — Z87891 Personal history of nicotine dependence: Secondary | ICD-10-CM | POA: Diagnosis not present

## 2023-12-09 DIAGNOSIS — G479 Sleep disorder, unspecified: Secondary | ICD-10-CM | POA: Diagnosis not present

## 2023-12-09 DIAGNOSIS — Z5112 Encounter for antineoplastic immunotherapy: Secondary | ICD-10-CM | POA: Diagnosis not present

## 2023-12-09 DIAGNOSIS — K1231 Oral mucositis (ulcerative) due to antineoplastic therapy: Secondary | ICD-10-CM | POA: Diagnosis not present

## 2023-12-09 DIAGNOSIS — M1990 Unspecified osteoarthritis, unspecified site: Secondary | ICD-10-CM | POA: Diagnosis not present

## 2023-12-09 DIAGNOSIS — Z5111 Encounter for antineoplastic chemotherapy: Secondary | ICD-10-CM | POA: Diagnosis not present

## 2023-12-09 DIAGNOSIS — C439 Malignant melanoma of skin, unspecified: Secondary | ICD-10-CM | POA: Diagnosis not present

## 2023-12-09 DIAGNOSIS — T451X5A Adverse effect of antineoplastic and immunosuppressive drugs, initial encounter: Secondary | ICD-10-CM | POA: Diagnosis not present

## 2023-12-09 DIAGNOSIS — E119 Type 2 diabetes mellitus without complications: Secondary | ICD-10-CM | POA: Diagnosis not present

## 2023-12-09 DIAGNOSIS — Z9229 Personal history of other drug therapy: Secondary | ICD-10-CM | POA: Diagnosis not present

## 2023-12-09 DIAGNOSIS — Z79899 Other long term (current) drug therapy: Secondary | ICD-10-CM | POA: Diagnosis not present

## 2023-12-09 DIAGNOSIS — T45AX5S Adverse effect of immune checkpoint inhibitors and immunostimulant drugs, sequela: Secondary | ICD-10-CM | POA: Diagnosis not present

## 2023-12-09 DIAGNOSIS — L409 Psoriasis, unspecified: Secondary | ICD-10-CM | POA: Diagnosis not present

## 2023-12-16 ENCOUNTER — Other Ambulatory Visit: Payer: Self-pay

## 2023-12-16 ENCOUNTER — Emergency Department (HOSPITAL_COMMUNITY)
Admission: EM | Admit: 2023-12-16 | Discharge: 2023-12-16 | Disposition: A | Discharge status: Home / Self Care | Attending: Emergency Medicine | Admitting: Emergency Medicine

## 2023-12-16 ENCOUNTER — Emergency Department (HOSPITAL_COMMUNITY)

## 2023-12-16 ENCOUNTER — Encounter (HOSPITAL_COMMUNITY): Payer: Self-pay

## 2023-12-16 DIAGNOSIS — R531 Weakness: Secondary | ICD-10-CM | POA: Insufficient documentation

## 2023-12-16 DIAGNOSIS — E119 Type 2 diabetes mellitus without complications: Secondary | ICD-10-CM | POA: Insufficient documentation

## 2023-12-16 DIAGNOSIS — Z8551 Personal history of malignant neoplasm of bladder: Secondary | ICD-10-CM | POA: Diagnosis not present

## 2023-12-16 DIAGNOSIS — R918 Other nonspecific abnormal finding of lung field: Secondary | ICD-10-CM | POA: Diagnosis not present

## 2023-12-16 DIAGNOSIS — I10 Essential (primary) hypertension: Secondary | ICD-10-CM | POA: Diagnosis not present

## 2023-12-16 DIAGNOSIS — Z79899 Other long term (current) drug therapy: Secondary | ICD-10-CM | POA: Insufficient documentation

## 2023-12-16 DIAGNOSIS — G9389 Other specified disorders of brain: Secondary | ICD-10-CM | POA: Diagnosis not present

## 2023-12-16 DIAGNOSIS — I517 Cardiomegaly: Secondary | ICD-10-CM | POA: Diagnosis not present

## 2023-12-16 DIAGNOSIS — R0602 Shortness of breath: Secondary | ICD-10-CM | POA: Diagnosis not present

## 2023-12-16 LAB — CBC WITH DIFFERENTIAL/PLATELET
Abs Immature Granulocytes: 0.01 10*3/uL (ref 0.00–0.07)
Basophils Absolute: 0.1 10*3/uL (ref 0.0–0.1)
Basophils Relative: 1 %
Eosinophils Absolute: 0.1 10*3/uL (ref 0.0–0.5)
Eosinophils Relative: 2 %
HCT: 40.3 % (ref 39.0–52.0)
Hemoglobin: 12.7 g/dL — ABNORMAL LOW (ref 13.0–17.0)
Immature Granulocytes: 0 %
Lymphocytes Relative: 15 %
Lymphs Abs: 0.9 10*3/uL (ref 0.7–4.0)
MCH: 29.4 pg (ref 26.0–34.0)
MCHC: 31.5 g/dL (ref 30.0–36.0)
MCV: 93.3 fL (ref 80.0–100.0)
Monocytes Absolute: 1.1 10*3/uL — ABNORMAL HIGH (ref 0.1–1.0)
Monocytes Relative: 20 %
Neutro Abs: 3.5 10*3/uL (ref 1.7–7.7)
Neutrophils Relative %: 62 %
Platelets: 234 10*3/uL (ref 150–400)
RBC: 4.32 MIL/uL (ref 4.22–5.81)
RDW: 13.8 % (ref 11.5–15.5)
WBC: 5.6 10*3/uL (ref 4.0–10.5)
nRBC: 0 % (ref 0.0–0.2)

## 2023-12-16 LAB — TROPONIN I (HIGH SENSITIVITY)
Troponin I (High Sensitivity): 13 ng/L (ref <18)
Troponin I (High Sensitivity): 11 ng/L (ref <18)

## 2023-12-16 LAB — RESP PANEL BY RT-PCR (RSV, FLU A&B, COVID)  RVPGX2
Influenza A by PCR: NEGATIVE
Influenza B by PCR: NEGATIVE
Resp Syncytial Virus by PCR: NEGATIVE
SARS Coronavirus 2 by RT PCR: NEGATIVE

## 2023-12-16 LAB — COMPREHENSIVE METABOLIC PANEL WITH GFR
ALT: 11 U/L (ref 0–44)
AST: 15 U/L (ref 15–41)
Albumin: 3.5 g/dL (ref 3.5–5.0)
Alkaline Phosphatase: 58 U/L (ref 38–126)
Anion gap: 9 (ref 5–15)
BUN: 12 mg/dL (ref 8–23)
CO2: 27 mmol/L (ref 22–32)
Calcium: 8.9 mg/dL (ref 8.9–10.3)
Chloride: 101 mmol/L (ref 98–111)
Creatinine, Ser: 0.82 mg/dL (ref 0.61–1.24)
GFR, Estimated: >60 mL/min (ref >60)
Glucose, Bld: 115 mg/dL — ABNORMAL HIGH (ref 70–99)
Potassium: 3.7 mmol/L (ref 3.5–5.1)
Sodium: 137 mmol/L (ref 135–145)
Total Bilirubin: 1 mg/dL (ref 0.0–1.2)
Total Protein: 6.3 g/dL — ABNORMAL LOW (ref 6.5–8.1)

## 2023-12-16 LAB — URINALYSIS, ROUTINE W REFLEX MICROSCOPIC
Bilirubin Urine: NEGATIVE
Glucose, UA: NEGATIVE mg/dL
Hgb urine dipstick: NEGATIVE
Ketones, ur: NEGATIVE mg/dL
Leukocytes,Ua: NEGATIVE
Nitrite: NEGATIVE
Protein, ur: NEGATIVE mg/dL
Specific Gravity, Urine: 1.019 (ref 1.005–1.030)
pH: 6 (ref 5.0–8.0)

## 2023-12-16 LAB — CBG MONITORING, ED: Glucose-Capillary: 102 mg/dL — ABNORMAL HIGH (ref 70–99)

## 2023-12-16 MED ORDER — LACTATED RINGERS IV BOLUS
500.0000 mL | Freq: Once | INTRAVENOUS | Status: AC
  Administered 2023-12-16: 500 mL via INTRAVENOUS

## 2023-12-16 NOTE — ED Provider Notes (Signed)
 [place] EMERGENCY DEPARTMENT AT [facility] Provider Note   CSN: [id number] Arrival date & time: 12/16/23  1433     History  Chief Complaint  Patient presents with   Weakness    Arthur Lutz is a 83 y.o. male.  Patient is a 83 year old male who presents with general weakness.  He has a history of diabetes, hypertension, prior bladder cancer, melanoma, prior stroke.  He is currently getting immunotherapy for melanoma at [facility]  He has got some sores in his mouth related to that.  He has not really been eating and drinking much and over the last few days he has had some progressive weakness.  He feels generally fatigued.  He was trying to get out of his car and was very weak and needed to have help getting back in.  He feels a little bit lightheaded.  He denies any cough or shortness of breath.  He has had some increased leg swelling.  He denies any.  No chest pain or tightness.  No nausea or vomiting.  He does have some loose stools which he has had with the immunotherapy treatment.  He denies any abdominal pain.  No known fevers.  No urinary symptoms.       Home Medications Prior to Admission medications   Medication Sig Start Date End Date Taking? Authorizing Provider  acetaminophen  (TYLENOL ) 650 MG CR tablet Take 1,300 mg by mouth every 8 (eight) hours as needed for pain.   Yes [provider]  amLODipine  (NORVASC ) 10 MG tablet TAKE (1) TABLET BY MOUTH ONCE DAILY. 07/15/23  Yes Arleen Lacer, MD  Apremilast (OTEZLA) 30 MG TABS Take 1 tablet by mouth 2 (two) times daily. Take as directed 05/23/22  Yes [provider]  ascorbic acid  (VITAMIN C) 1000 MG tablet Take 1,000 mg by mouth daily. 10/10/17  Yes [provider]  atorvastatin  (LIPITOR) 40 MG tablet Take 1 tablet (40 mg total) by mouth daily. Patient taking differently: Take 40 mg by mouth every evening. 12/02/23 03/01/24 Yes Arleen Lacer, MD  azelastine  (ASTELIN ) 0.1 % nasal spray Place  1 spray into the nose daily as needed (allergies).   Yes [provider]  clobetasol (TEMOVATE) 0.05 % external solution Apply 1 application topically daily as needed (irritation). 03/01/19  Yes [provider]  Coenzyme Q10 (COQ10) 100 MG CAPS Take 100 mg by mouth daily.   Yes [provider]  DPH-Lido-AlHydr-MgHydr-Simeth (FIRST-MOUTHWASH BLM) SUSP Take 5 mLs by mouth daily. 11/30/23  Yes [provider]  furosemide  (LASIX ) 20 MG tablet Take 1 tablet (20 mg total) by mouth daily as needed. An may take an additional 20 mg if needed Patient taking differently: Take 20 mg by mouth daily as needed for fluid. 09/03/22  Yes Arleen Lacer, MD  gabapentin (NEURONTIN) 100 MG capsule Take 100 mg by mouth at bedtime as needed (pain).   Yes [provider]  hydrALAZINE  (APRESOLINE ) 50 MG tablet TAKE (1) TABLET BY MOUTH (3) TIMES DAILY. Patient taking differently: Take 50 mg by mouth daily. 12/02/22  Yes Arleen Lacer, MD  isosorbide  mononitrate (IMDUR ) 60 MG 24 hr tablet TAKE (1) TABLET BY MOUTH ONCE DAILY. 12/08/23  Yes Arleen Lacer, MD  lisinopril  (ZESTRIL ) 40 MG tablet TAKE 1/2 TABLET BY MOUTH ONCE DAILY. 07/10/22  Yes Arleen Lacer, MD  loperamide  (IMODIUM ) 2 MG capsule Take 1 capsule (2 mg total) by mouth 4 (four) times daily as needed for diarrhea  or loose stools. 03/30/19  Yes Edson Graces, MD  LOTEMAX 0.5 % OINT Place 1 application into the left eye daily as needed (irritation, dry eyes). 02/15/21  Yes [provider]  magnesium  gluconate (MAGONATE) 500 MG tablet Take 500 mg by mouth daily.   Yes [provider]  meloxicam (MOBIC) 15 MG tablet Take 15 mg by mouth daily as needed for pain.   Yes [provider]  naproxen sodium (ALEVE) 220 MG tablet Take 440 mg by mouth 2 (two) times daily as needed (pain).   Yes [provider]  OVER THE COUNTER MEDICATION Take 2 Capfuls by mouth daily. HistaQuel (Histamine)   Yes  [provider]  OVER THE COUNTER MEDICATION Take 1 capsule by mouth daily. Blood Sugar Formula   Yes [provider]  oxyCODONE  (OXY IR/ROXICODONE ) 5 MG immediate release tablet Take 5 mg by mouth every 4 (four) hours as needed for moderate pain (pain score 4-6). 12/09/23  Yes [provider]  prednisoLONE acetate (PRED FORTE) 1 % ophthalmic suspension Place 1 drop into both eyes 2 (two) times daily as needed (irritation). 03/08/21  Yes [provider]  predniSONE  (DELTASONE ) 10 MG tablet TAKE AS DIRECTED AS NEEDED FOR ARTHRITIS. TAKE 3 TABLETS FOR 2 DAYS, THEN 2 TABLETS FOR 1 DAY, THEN 1 TABLET FOR 1 DAY. Patient taking differently: Take 10 mg by mouth See admin instructions. TAKE AS DIRECTED AS NEEDED FOR ARTHRITIS. TAKE 3 TABLETS FOR 2 DAYS, THEN 2 TABLETS FOR 1 DAY, THEN 1 TABLET FOR 1 DAY. 01/16/23  Yes Arleen Lacer, MD  spironolactone  (ALDACTONE ) 25 MG tablet Take 1 tablet (25 mg total) by mouth daily. 09/03/22  Yes Arleen Lacer, MD  tobramycin (TOBREX) 0.3 % ophthalmic solution Place 1 drop into both eyes daily as needed (for eye irritation). 02/20/21  Yes [provider]  triazolam  (HALCION ) 0.125 MG tablet Take 1.5 tablets (0.1875 mg total) by mouth at bedtime. Pt takes 1.5 tabs at bedtime Patient taking differently: Take 0.25 mg by mouth at bedtime. 08/29/17  Yes Arleen Lacer, MD  valACYclovir (VALTREX) 500 MG tablet Take 500 mg by mouth daily. 11/28/23 11/27/24 Yes [provider]  EPINEPHrine  0.3 mg/0.3 mL IJ SOAJ injection Inject 0.3 mg into the muscle as needed for anaphylaxis. 12/25/18   [provider]  ezetimibe  (ZETIA ) 10 MG tablet Take 10 mg by mouth daily. Patient not taking: Reported on 12/16/2023    [provider]  Naltrexone HCl, Pain, (NALTREX) 4.5 MG CAPS Take 4.5 mg by mouth every evening. Patient not taking: Reported on 12/16/2023    [provider]      Allergies    Apremilast, Aspirin ,  Leflunomide, Methotrexate, Rosuvastatin calcium , and Other    Review of Systems   Review of Systems  Constitutional:  Positive for fatigue. Negative for chills, diaphoresis and fever.  HENT:  Positive for mouth sores. Negative for congestion, rhinorrhea and sneezing.   Eyes: Negative.   Respiratory:  Negative for cough, chest tightness and shortness of breath.   Cardiovascular:  Negative for chest pain and leg swelling.  Gastrointestinal:  Positive for diarrhea. Negative for abdominal pain, blood in stool, nausea and vomiting.  Genitourinary:  Negative for difficulty urinating, flank pain, frequency and hematuria.  Musculoskeletal:  Negative for arthralgias and back pain.  Skin:  Negative for rash.  Neurological:  Positive for light-headedness. Negative for dizziness, speech difficulty, weakness, numbness and headaches.    Physical Exam Updated Vital  Signs BP (!) 140/79 (BP Location: Left Arm)   Pulse 83   Temp 98.6 F (37 C) (Oral)   Resp 17   Ht 6' (1.829 m)   Wt 106.6 kg   SpO2 90%   BMI 31.87 kg/m  Physical Exam Constitutional:      Appearance: He is well-developed.  HENT:     Head: Normocephalic and atraumatic.     Mouth/Throat:     Comments: Some crusted lesions on his lips Eyes:     Pupils: Pupils are equal, round, and reactive to light.  Cardiovascular:     Rate and Rhythm: Normal rate and regular rhythm.     Heart sounds: Normal heart sounds.  Pulmonary:     Effort: Pulmonary effort is normal. No respiratory distress.     Breath sounds: Normal breath sounds. No wheezing or rales.     Comments: Patient is noted to be hypoxic at 84% on room air with a good waveform.  Was placed on 2 L/min of oxygen.  This was increased to 4 L to get his oxygen saturations over 90.  No increased work of breathing. Chest:     Chest wall: No tenderness.  Abdominal:     General: Bowel sounds are normal.     Palpations: Abdomen is soft.     Tenderness: There is no abdominal  tenderness. There is no guarding or rebound.  Musculoskeletal:        General: Normal range of motion.     Cervical back: Normal range of motion and neck supple.     Comments: 3+ pitting edema to lower extremities bilaterally.  He has some ecchymosis to his left calf which family says is unchanged.  Lymphadenopathy:     Cervical: No cervical adenopathy.  Skin:    General: Skin is warm and dry.     Findings: No rash.  Neurological:     Mental Status: He is alert and oriented to person, place, and time.     ED Results / Procedures / Treatments   Labs (all labs ordered are listed, but only abnormal results are displayed) Labs Reviewed  COMPREHENSIVE METABOLIC PANEL WITH GFR - Abnormal; Notable for the following components:      Result Value   Glucose, Bld 115 (*)    Total Protein 6.3 (*)    All other components within normal limits  CBC WITH DIFFERENTIAL/PLATELET - Abnormal; Notable for the following components:   Hemoglobin 12.7 (*)    Monocytes Absolute 1.1 (*)    All other components within normal limits  CBG MONITORING, ED - Abnormal; Notable for the following components:   Glucose-Capillary 102 (*)    All other components within normal limits  RESP PANEL BY RT-PCR (RSV, FLU A&B, COVID)  [id number]  URINALYSIS, ROUTINE W REFLEX MICROSCOPIC  TROPONIN I (HIGH SENSITIVITY)  TROPONIN I (HIGH SENSITIVITY)    EKG EKG Interpretation Date/Time:  Tuesday Dec 16 2023 15:24:15 EDT Ventricular Rate:  82 PR Interval:  162 QRS Duration:  134 QT Interval:  390 QTC Calculation: 455 R Axis:   23  Text Interpretation: Sinus rhythm with Premature atrial complexes Right bundle branch block Abnormal ECG When compared with ECG of 02-Dec-2023 15:15, PREVIOUS ECG IS PRESENT since last tracing no significant change Confirmed by Hershel Los ([id number]) on 12/16/2023 5:29:39 PM  Radiology DG Chest Port 1 View Result Date: 12/16/2023 CLINICAL DATA:  Shortness of breath. EXAM: PORTABLE CHEST 1 VIEW  COMPARISON:  May 13, 2021. FINDINGS: Stable cardiomegaly.  Left subclavian Port-A-Cath is unchanged. Minimal right basilar subsegmental atelectasis or scarring is noted. Left lung is clear. Bony thorax is unremarkable. IMPRESSION: Minimal right basilar subsegmental atelectasis or scarring. Electronically Signed   By: Rosalene Colon M.D.   On: 12/16/2023 18:31   CT Head Wo Contrast Result Date: 12/16/2023 CLINICAL DATA:  Generalized weakness x5 days. EXAM: CT HEAD WITHOUT CONTRAST TECHNIQUE: Contiguous axial images were obtained from the base of the skull through the vertex without intravenous contrast. RADIATION DOSE REDUCTION: This exam was performed according to the departmental dose-optimization program which includes automated exposure control, adjustment of the mA and/or kV according to patient size and/or use of iterative reconstruction technique. COMPARISON:  None Available. FINDINGS: Brain: There is generalized cerebral atrophy with widening of the extra-axial spaces and ventricular dilatation. There are areas of decreased attenuation within the white matter tracts of the supratentorial brain, consistent with microvascular disease changes. Vascular: No hyperdense vessel or unexpected calcification. Skull: Normal. Negative for fracture or focal lesion. Sinuses/Orbits: No acute finding. Other: None. IMPRESSION: 1. Generalized cerebral atrophy and microvascular disease changes of the supratentorial brain. 2. No acute intracranial abnormality. Electronically Signed   By: Virgle Grime M.D.   On: 12/16/2023 17:31    Procedures Procedures    Medications Ordered in ED Medications  lactated ringers  bolus 500 mL (500 mLs Intravenous New Bag/Given 12/16/23 1748)    ED Course/ Medical Decision Making/ A&P                                 Medical Decision Making Amount and/or Complexity of Data Reviewed Radiology: ordered.   Patient is a 83 year old male who presents with general weakness.   He is currently getting immunotherapy for melanoma at [facility] and has not been eating and drinking much because of mouth sores.  He does not really report any other symptoms.  He does not have a fever.  Chest x-ray does not show any evidence of pneumonia or pulmonary edema.  This was interpreted by me and confirmed by the radiologist.  EKG does not show any concerning arrhythmias or ischemic changes.  He does not have other symptoms that would be more concerning for ACS.  Labs are nonconcerning.  He was notably hypoxic on arrival.  When I assessed him laying in bed, his oxygenation was about 84% on room air.  He was placed on nasal cannula oxygen.  Chest x-ray was nonrevealing as a source of the hypoxia.  Given his risk factors, CTA was ordered to assess for PE or may be occult pneumonia.  Although he is not symptomatic for pneumonia.  However when I went back to advise him of this, he sitting on the bed saying that he feels better and ready to go home.  He was given some IV fluids.  I advised him that due to his hypoxia, I was concerned about other issues such as a pulmonary embolus.  He says that he feels fine and wants to go home.  His oxygenation was 92 on room air.  He says his oxygen level is always a little bit low and he does not want to stay for any further treatment.  I did advise him that a PE could potentially be life-threatening.  He is still not wanting to get the test and wants to go home.  He was discharged home.  He was encouraged to follow-up with his primary care doctor.  Return precautions were given.  Final Clinical Impression(s) / ED Diagnoses Final diagnoses:  Weakness    Rx / DC Orders ED Discharge Orders     None         Hershel Los, MD 12/16/23 [phone]
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
## 2023-12-16 NOTE — ED Notes (Signed)
 Patient transported to CT

## 2023-12-18 DIAGNOSIS — K1231 Oral mucositis (ulcerative) due to antineoplastic therapy: Secondary | ICD-10-CM | POA: Diagnosis not present

## 2023-12-18 DIAGNOSIS — M2550 Pain in unspecified joint: Secondary | ICD-10-CM | POA: Diagnosis not present

## 2023-12-18 DIAGNOSIS — R5383 Other fatigue: Secondary | ICD-10-CM | POA: Diagnosis not present

## 2023-12-18 DIAGNOSIS — C4330 Malignant melanoma of unspecified part of face: Secondary | ICD-10-CM | POA: Diagnosis not present

## 2023-12-18 DIAGNOSIS — L4050 Arthropathic psoriasis, unspecified: Secondary | ICD-10-CM | POA: Diagnosis not present

## 2023-12-18 DIAGNOSIS — K0889 Other specified disorders of teeth and supporting structures: Secondary | ICD-10-CM | POA: Diagnosis not present

## 2023-12-18 DIAGNOSIS — R531 Weakness: Secondary | ICD-10-CM | POA: Diagnosis not present

## 2023-12-18 DIAGNOSIS — T45AX5S Adverse effect of immune checkpoint inhibitors and immunostimulant drugs, sequela: Secondary | ICD-10-CM | POA: Diagnosis not present

## 2023-12-24 DIAGNOSIS — K1231 Oral mucositis (ulcerative) due to antineoplastic therapy: Secondary | ICD-10-CM | POA: Diagnosis not present

## 2023-12-24 DIAGNOSIS — E1129 Type 2 diabetes mellitus with other diabetic kidney complication: Secondary | ICD-10-CM | POA: Diagnosis not present

## 2023-12-24 DIAGNOSIS — E860 Dehydration: Secondary | ICD-10-CM | POA: Diagnosis not present

## 2023-12-24 DIAGNOSIS — C679 Malignant neoplasm of bladder, unspecified: Secondary | ICD-10-CM | POA: Diagnosis not present

## 2023-12-24 DIAGNOSIS — Z6833 Body mass index (BMI) 33.0-33.9, adult: Secondary | ICD-10-CM | POA: Diagnosis not present

## 2023-12-24 DIAGNOSIS — L4053 Psoriatic spondylitis: Secondary | ICD-10-CM | POA: Diagnosis not present

## 2023-12-24 DIAGNOSIS — C439 Malignant melanoma of skin, unspecified: Secondary | ICD-10-CM | POA: Diagnosis not present

## 2023-12-24 DIAGNOSIS — E6609 Other obesity due to excess calories: Secondary | ICD-10-CM | POA: Diagnosis not present

## 2023-12-24 DIAGNOSIS — M329 Systemic lupus erythematosus, unspecified: Secondary | ICD-10-CM | POA: Diagnosis not present

## 2023-12-24 DIAGNOSIS — I10 Essential (primary) hypertension: Secondary | ICD-10-CM | POA: Diagnosis not present

## 2023-12-25 DIAGNOSIS — C439 Malignant melanoma of skin, unspecified: Secondary | ICD-10-CM | POA: Diagnosis not present

## 2023-12-27 ENCOUNTER — Other Ambulatory Visit: Payer: Self-pay | Admitting: Cardiology

## 2023-12-31 DIAGNOSIS — C4330 Malignant melanoma of unspecified part of face: Secondary | ICD-10-CM | POA: Diagnosis not present

## 2023-12-31 DIAGNOSIS — T45AX5D Adverse effect of immune checkpoint inhibitors and immunostimulant drugs, subsequent encounter: Secondary | ICD-10-CM | POA: Diagnosis not present

## 2023-12-31 DIAGNOSIS — K1231 Oral mucositis (ulcerative) due to antineoplastic therapy: Secondary | ICD-10-CM | POA: Diagnosis not present

## 2023-12-31 DIAGNOSIS — K5900 Constipation, unspecified: Secondary | ICD-10-CM | POA: Diagnosis not present

## 2024-01-08 DIAGNOSIS — T45AX5A Adverse effect of immune checkpoint inhibitors and immunostimulant drugs, initial encounter: Secondary | ICD-10-CM | POA: Diagnosis not present

## 2024-01-08 DIAGNOSIS — R5383 Other fatigue: Secondary | ICD-10-CM | POA: Diagnosis not present

## 2024-01-08 DIAGNOSIS — C439 Malignant melanoma of skin, unspecified: Secondary | ICD-10-CM | POA: Diagnosis not present

## 2024-01-08 DIAGNOSIS — M2540 Effusion, unspecified joint: Secondary | ICD-10-CM | POA: Diagnosis not present

## 2024-01-08 DIAGNOSIS — K1231 Oral mucositis (ulcerative) due to antineoplastic therapy: Secondary | ICD-10-CM | POA: Diagnosis not present

## 2024-01-08 DIAGNOSIS — Z5189 Encounter for other specified aftercare: Secondary | ICD-10-CM | POA: Diagnosis not present

## 2024-01-08 DIAGNOSIS — E2749 Other adrenocortical insufficiency: Secondary | ICD-10-CM | POA: Diagnosis not present

## 2024-01-08 DIAGNOSIS — Z9289 Personal history of other medical treatment: Secondary | ICD-10-CM | POA: Diagnosis not present

## 2024-01-08 DIAGNOSIS — Z7952 Long term (current) use of systemic steroids: Secondary | ICD-10-CM | POA: Diagnosis not present

## 2024-01-17 ENCOUNTER — Ambulatory Visit
Admission: EM | Admit: 2024-01-17 | Discharge: 2024-01-17 | Disposition: A | Discharge status: Home / Self Care | Attending: Internal Medicine | Admitting: Internal Medicine

## 2024-01-17 ENCOUNTER — Ambulatory Visit: Payer: Self-pay | Admitting: Internal Medicine

## 2024-01-17 ENCOUNTER — Ambulatory Visit (HOSPITAL_COMMUNITY)
Admission: RE | Admit: 2024-01-17 | Discharge: 2024-01-17 | Disposition: A | Discharge status: Home / Self Care | Source: Ambulatory Visit | Attending: Internal Medicine | Admitting: Internal Medicine

## 2024-01-17 DIAGNOSIS — E860 Dehydration: Secondary | ICD-10-CM | POA: Diagnosis not present

## 2024-01-17 DIAGNOSIS — W2203XA Walked into furniture, initial encounter: Secondary | ICD-10-CM | POA: Insufficient documentation

## 2024-01-17 DIAGNOSIS — M19042 Primary osteoarthritis, left hand: Secondary | ICD-10-CM | POA: Diagnosis not present

## 2024-01-17 DIAGNOSIS — Z955 Presence of coronary angioplasty implant and graft: Secondary | ICD-10-CM | POA: Diagnosis not present

## 2024-01-17 DIAGNOSIS — Z96653 Presence of artificial knee joint, bilateral: Secondary | ICD-10-CM | POA: Diagnosis not present

## 2024-01-17 DIAGNOSIS — W1830XA Fall on same level, unspecified, initial encounter: Secondary | ICD-10-CM | POA: Diagnosis not present

## 2024-01-17 DIAGNOSIS — J9811 Atelectasis: Secondary | ICD-10-CM | POA: Diagnosis not present

## 2024-01-17 DIAGNOSIS — J439 Emphysema, unspecified: Secondary | ICD-10-CM | POA: Diagnosis not present

## 2024-01-17 DIAGNOSIS — R9082 White matter disease, unspecified: Secondary | ICD-10-CM | POA: Diagnosis not present

## 2024-01-17 DIAGNOSIS — S60352D Superficial foreign body of left thumb, subsequent encounter: Secondary | ICD-10-CM | POA: Insufficient documentation

## 2024-01-17 DIAGNOSIS — Z23 Encounter for immunization: Secondary | ICD-10-CM | POA: Diagnosis not present

## 2024-01-17 DIAGNOSIS — M79642 Pain in left hand: Secondary | ICD-10-CM | POA: Insufficient documentation

## 2024-01-17 DIAGNOSIS — J9621 Acute and chronic respiratory failure with hypoxia: Secondary | ICD-10-CM | POA: Diagnosis not present

## 2024-01-17 DIAGNOSIS — N401 Enlarged prostate with lower urinary tract symptoms: Secondary | ICD-10-CM | POA: Diagnosis not present

## 2024-01-17 DIAGNOSIS — Z8673 Personal history of transient ischemic attack (TIA), and cerebral infarction without residual deficits: Secondary | ICD-10-CM | POA: Diagnosis not present

## 2024-01-17 DIAGNOSIS — Z7984 Long term (current) use of oral hypoglycemic drugs: Secondary | ICD-10-CM | POA: Diagnosis not present

## 2024-01-17 DIAGNOSIS — R338 Other retention of urine: Secondary | ICD-10-CM | POA: Diagnosis not present

## 2024-01-17 DIAGNOSIS — E11628 Type 2 diabetes mellitus with other skin complications: Secondary | ICD-10-CM | POA: Diagnosis not present

## 2024-01-17 DIAGNOSIS — E118 Type 2 diabetes mellitus with unspecified complications: Secondary | ICD-10-CM | POA: Diagnosis not present

## 2024-01-17 DIAGNOSIS — M25532 Pain in left wrist: Secondary | ICD-10-CM

## 2024-01-17 DIAGNOSIS — M19032 Primary osteoarthritis, left wrist: Secondary | ICD-10-CM | POA: Diagnosis not present

## 2024-01-17 DIAGNOSIS — G9340 Encephalopathy, unspecified: Secondary | ICD-10-CM | POA: Diagnosis not present

## 2024-01-17 DIAGNOSIS — I110 Hypertensive heart disease with heart failure: Secondary | ICD-10-CM | POA: Diagnosis not present

## 2024-01-17 DIAGNOSIS — E785 Hyperlipidemia, unspecified: Secondary | ICD-10-CM | POA: Diagnosis not present

## 2024-01-17 DIAGNOSIS — I517 Cardiomegaly: Secondary | ICD-10-CM | POA: Diagnosis not present

## 2024-01-17 DIAGNOSIS — Z7952 Long term (current) use of systemic steroids: Secondary | ICD-10-CM | POA: Diagnosis not present

## 2024-01-17 DIAGNOSIS — I252 Old myocardial infarction: Secondary | ICD-10-CM | POA: Diagnosis not present

## 2024-01-17 DIAGNOSIS — R4182 Altered mental status, unspecified: Secondary | ICD-10-CM | POA: Diagnosis not present

## 2024-01-17 DIAGNOSIS — L03114 Cellulitis of left upper limb: Secondary | ICD-10-CM

## 2024-01-17 DIAGNOSIS — K5790 Diverticulosis of intestine, part unspecified, without perforation or abscess without bleeding: Secondary | ICD-10-CM | POA: Diagnosis not present

## 2024-01-17 DIAGNOSIS — E876 Hypokalemia: Secondary | ICD-10-CM | POA: Diagnosis not present

## 2024-01-17 DIAGNOSIS — L03119 Cellulitis of unspecified part of limb: Secondary | ICD-10-CM | POA: Diagnosis not present

## 2024-01-17 DIAGNOSIS — Z87891 Personal history of nicotine dependence: Secondary | ICD-10-CM | POA: Diagnosis not present

## 2024-01-17 DIAGNOSIS — I2510 Atherosclerotic heart disease of native coronary artery without angina pectoris: Secondary | ICD-10-CM | POA: Diagnosis not present

## 2024-01-17 DIAGNOSIS — R531 Weakness: Secondary | ICD-10-CM | POA: Diagnosis not present

## 2024-01-17 DIAGNOSIS — I700 Atherosclerosis of aorta: Secondary | ICD-10-CM | POA: Diagnosis not present

## 2024-01-17 DIAGNOSIS — J9611 Chronic respiratory failure with hypoxia: Secondary | ICD-10-CM | POA: Diagnosis not present

## 2024-01-17 DIAGNOSIS — I358 Other nonrheumatic aortic valve disorders: Secondary | ICD-10-CM | POA: Diagnosis not present

## 2024-01-17 DIAGNOSIS — K7689 Other specified diseases of liver: Secondary | ICD-10-CM | POA: Diagnosis not present

## 2024-01-17 DIAGNOSIS — R410 Disorientation, unspecified: Secondary | ICD-10-CM | POA: Diagnosis not present

## 2024-01-17 DIAGNOSIS — A419 Sepsis, unspecified organism: Secondary | ICD-10-CM | POA: Diagnosis not present

## 2024-01-17 DIAGNOSIS — C7931 Secondary malignant neoplasm of brain: Secondary | ICD-10-CM | POA: Diagnosis not present

## 2024-01-17 DIAGNOSIS — I5032 Chronic diastolic (congestive) heart failure: Secondary | ICD-10-CM | POA: Diagnosis not present

## 2024-01-17 DIAGNOSIS — K828 Other specified diseases of gallbladder: Secondary | ICD-10-CM | POA: Diagnosis not present

## 2024-01-17 DIAGNOSIS — I672 Cerebral atherosclerosis: Secondary | ICD-10-CM | POA: Diagnosis not present

## 2024-01-17 DIAGNOSIS — E2740 Unspecified adrenocortical insufficiency: Secondary | ICD-10-CM | POA: Diagnosis not present

## 2024-01-17 DIAGNOSIS — I10 Essential (primary) hypertension: Secondary | ICD-10-CM | POA: Diagnosis not present

## 2024-01-17 DIAGNOSIS — R456 Violent behavior: Secondary | ICD-10-CM | POA: Diagnosis not present

## 2024-01-17 DIAGNOSIS — G9341 Metabolic encephalopathy: Secondary | ICD-10-CM | POA: Diagnosis not present

## 2024-01-17 DIAGNOSIS — M7989 Other specified soft tissue disorders: Secondary | ICD-10-CM | POA: Diagnosis not present

## 2024-01-17 MED ORDER — AMOXICILLIN-POT CLAVULANATE 875-125 MG PO TABS: 1.0000 | ORAL_TABLET | Freq: Two times a day (BID) | ORAL | 0 refills | Status: DC

## 2024-01-17 MED ORDER — TETANUS-DIPHTH-ACELL PERTUSSIS 5-2.5-18.5 LF-MCG/0.5 IM SUSY
0.5000 mL | PREFILLED_SYRINGE | Freq: Once | INTRAMUSCULAR | Status: AC
  Administered 2024-01-17: 0.5 mL via INTRAMUSCULAR

## 2024-01-17 NOTE — ED Provider Notes (Addendum)
 [facility]    CSN: [id number] Arrival date & time: 01/17/24  0820      History   Chief Complaint No chief complaint on file.   HPI Arthur Lutz is a 83 y.o. male.   Patient presents to urgent care for evaluation of pain and swelling of the left wrist/left hand that started as a result of an injury 2 days ago.  He is unsure of what he hit his left hand on but believes he may have hit it on a metal machine.  Injury caused an abrasion to the dorsum of the left hand.  He states hand and wrist did not hurt very much initially after the injury and began hurting/swelling the next day.  He now has significant bruising, redness, warmth, and swelling of the dorsum of the left hand.  Left wrist is additionally tender and swollen.  Denies numbness and tingling distally to injury, previous injury to the left wrist/left hand, fever, chills, body aches, and recent antibiotic or steroid use.  History of type 2 diabetes.  Last tetanus injection was in 2018. He has taken tylenol  for pain prior to arrival with minimal relief of pain.      Past Medical History:  Diagnosis Date   Allergic rhinitis    Aortic valve sclerosis    Cause of murmur   Arthritis of knee, left, needs total Knee in future with Dr. Melodi  08/07/2011   Bladder cancer (HCC) 11/2005   CAD S/P percutaneous coronary angioplasty 08/07/2011   NEW 08/07/11 cutting balloon PTCA to 95% ostial diag. and 90%  proximal LCX.  prior cutting balloon and to RCA  & ostium of ostial diag; Last Cath 02/2012 - Patent Diag PTCA, RCA & Circ BMS stents. Normal EF & EDP; Myoview  2/14 no evidence of ischemia or infarction   Diabetes mellitus type 2, controlled, with complications (HCC)    Dyslipidemia    History of heart attack 11/2007   5/'09: mild; did not affect the heart muscle; PCI to RCA, Cutting PTCA Diag ostium;   Hypertension    Macular degeneration    Macular degeneration of right eye    Now getting shots.    Melanoma of skin  (HCC) ~ 2005   level 2; on my back   Osteoarthritis    Status post right knee arthroplasty, positive for staph infection. Pending right arthroplasty   Portacath in place 08/11/2012   Statin intolerance 08/07/2011   Stroke (HCC) 1980's   had facial strokes; 2; about a year apart; never did find what caused them    Patient Active Problem List   Diagnosis Date Noted   Arthritis of both hands 09/04/2022   Acute metabolic encephalopathy    AKI (acute kidney injury) (HCC) 05/12/2021   COVID-19 virus infection 05/12/2021   Elevated d-dimer 05/12/2021   Psoriasis 04/23/2019   Colitis 04/06/2019   Diarrhea 04/06/2019   Abdominal pain 03/29/2019   Seasonal and perennial allergic rhinitis 01/06/2019   Sinus bradycardia 08/29/2017   Chronic stable angina (HCC) - negative Myoview  & no new Dz on Cath 05/23/2015   Insomnia due to stress 07/04/2014    Class: Chronic   Venous stasis of both lower extremities 11/24/2013   Abnormal biliary HIDA scan 09/15/2013   Intestinal bacterial overgrowth 08/16/2013   Bloating 08/16/2013   Obesity (BMI 30.0-34.9) 01/30/2013   Stiffness of joint, not elsewhere classified, lower leg 10/26/2012   Weakness of left leg 10/26/2012   Difficulty walking 10/26/2012  Postoperative anemia due to acute blood loss 09/29/2012   OA (osteoarthritis) of knee 09/28/2012   Port-A-Cath in place 08/11/2012   CAD S/P percutaneous coronary angioplasty: 08/07/11 cutting balloon PTCA to 95% ostial diag. and 90%  proximal LCX.  prior cutting balloon to RCA  & ostium of ostial diag., 2009  08/07/2011   Essential hypertension 08/07/2011   Diabetes mellitus type 2, controlled, with complications (HCC) 08/07/2011   Hyperlipidemia associated with type 2 diabetes mellitus (HCC) 08/07/2011   Arthritis of knee, left, needs total Knee in future with Dr. Melodi  08/07/2011   Statin myopathy 08/07/2011    Past Surgical History:  Procedure Laterality Date   7-day ZIO PATCH MONITOR   06/2021   Predominant rhythm-sinus w/ bundle branch block/IVCD noted.  HR range 35-136 bpm.  Average 67 bpm. HR 35 bpm @ 1AM. 19 atrial runs: Fastest and longest interval was 27.5 seconds (60 beats) with an average heart rate of 132 bpm, max 54 bpm =>occasionally w/ aberrancy & NOT not noted on diary.  Occasional isolated PACs.  No sustained tachy or bradycardic arrhythmia.  No chronotropic incompetence.   BIOPSY  05/28/2019   Procedure: BIOPSY;  Surgeon: Golda Claudis PENNER, MD;  Location: [facility];  Service: Endoscopy;;  cecum   CATARACT EXTRACTION W/ INTRAOCULAR LENS IMPLANT  ~ 2010   right   CATARACT EXTRACTION W/PHACO Left 11/21/2016   Procedure: CATARACT EXTRACTION PHACO AND INTRAOCULAR LENS PLACEMENT (IOC) CDE - 11.70 ;  Surgeon: Cherene Mania, MD;  Location: [facility];  Service: Ophthalmology;  Laterality: Left;  left   cold cup removal bladder lesion  11/2005   malignant   COLONOSCOPY N/A 12/09/2014   Procedure: COLONOSCOPY;  Surgeon: Claudis PENNER Golda, MD;  Location: [facility];  Service: Endoscopy;  Laterality: N/A;  955 - moved to 8:30 - Ann to notify pt   COLONOSCOPY WITH PROPOFOL  N/A 05/28/2019   Procedure: COLONOSCOPY WITH PROPOFOL ;  Surgeon: Golda Claudis PENNER, MD;  [facility]: [facility];  Service: Endoscopy;  Laterality: N/A;   COLONOSCOPY WITH PROPOFOL  N/A 03/15/2022   Procedure: COLONOSCOPY WITH PROPOFOL ;  Surgeon: Eartha Angelia Sieving, MD;  Location: [facility];  Service: Gastroenterology;  Laterality: N/A;  230 ASA 2   CORONARY ANGIOPLASTY WITH STENT PLACEMENT  11/2007   Mid RCA - Driver BMS 3.5 mm x 15 mm; 2.0 mm Cutting Balloon PTCA - ostial D1   INGUINAL HERNIA REPAIR  ~ 1970   left   KNEE ARTHROSCOPY  02/20/2011   left   LEFT HEART CATHETERIZATION WITH CORONARY ANGIOGRAM N/A 08/07/2011   Procedure: LEFT HEART CATHETERIZATION WITH CORONARY ANGIOGRAM;  Surgeon: Alm LELON Clay, MD;  Location: [facility] CATH LAB;  Service: Cardiovascular;  Laterality: N/A;;; Abnormal TM  Myoview  - for exertional throat discomfort: Patent RCA stent, 90% circumflex -- BMS PCI; Ostial D2 95% - PTCA   LEFT HEART CATHETERIZATION WITH CORONARY ANGIOGRAM N/A 03/06/2012   Procedure: LEFT HEART CATHETERIZATION WITH CORONARY ANGIOGRAM;  Surgeon: Alm LELON Clay, MD;  Location: [facility] CATH LAB;  Service: Cardiovascular;;Widely patent RCA and LCx stents. Also patent PTCA site to D1, ~40-50% D2. Normal EF, normal EDP   NM MYOVIEW  LTD  08/29/2021   a) 07/2012: Low risk, EF 49%, improved from prior. Small apical and apical septal defect, fixed likely artifact no skin or infarction.; b)  08/2021: LOW RISK.  EF 60 to 65% (61%).  No evidence of ischemia or infarction.  Small apical defect likely related to apical thinning.  PARTIAL KNEE ARTHROPLASTY  06/2003   right   PERCUTANEOUS CORONARY STENT INTERVENTION (PCI-S)  08/07/2011   Procedure: PERCUTANEOUS CORONARY STENT INTERVENTION (PCI-S);  Surgeon: Alm LELON Clay, MD;  Location: [facility] CATH LAB;  Service: CV;;  Abnormal TM Myoview  - for exertional throat discomfort: Patent RCA stent, 90% circumflex -- Vision BMS 3.5 mm x 12 mm --> 4.2 mm. Ostial D2 95% -- 2.25 mm Cutting Balloon PTCA   POLYPECTOMY  05/28/2019   Procedure: POLYPECTOMY;  Surgeon: Golda Claudis PENNER, MD;  [facility];  Service: Endoscopy;;  colon   POLYPECTOMY  03/15/2022   Procedure: POLYPECTOMY INTESTINAL;  Surgeon: Eartha Angelia Sieving, MD;  Location: [facility];  Service: Gastroenterology;;   TONSILLECTOMY     as a child   TOTAL KNEE ARTHROPLASTY  11/2003   right x2 and one on left- Total of 3.   TOTAL KNEE ARTHROPLASTY Left 09/28/2012   Procedure: TOTAL KNEE ARTHROPLASTY;  Surgeon: Dempsey LULLA Moan, MD;  Location: [facility];  Service: Orthopedics;  Laterality: Left;   TRANSTHORACIC ECHOCARDIOGRAM  05/14/2021   EF 55 to 60%.  No R WMA.  GR 1 DD.   Mild LA dilation. Normal PAP and RAP.  Essentially normal.       Home Medications    Prior to Admission medications    Medication Sig Start Date End Date Taking? Authorizing Provider  amoxicillin -clavulanate (AUGMENTIN ) 875-125 MG tablet Take 1 tablet by mouth every 12 (twelve) hours. 01/17/24  Yes Enedelia Dorna HERO, FNP  acetaminophen  (TYLENOL ) 650 MG CR tablet Take 1,300 mg by mouth every 8 (eight) hours as needed for pain.    [provider]  amLODipine  (NORVASC ) 10 MG tablet TAKE (1) TABLET BY MOUTH ONCE DAILY. 12/29/23   Clay Alm LELON, MD  Apremilast (OTEZLA) 30 MG TABS Take 1 tablet by mouth 2 (two) times daily. Take as directed 05/23/22   [provider]  ascorbic acid  (VITAMIN C) 1000 MG tablet Take 1,000 mg by mouth daily. 10/10/17   [provider]  atorvastatin  (LIPITOR) 40 MG tablet Take 1 tablet (40 mg total) by mouth daily. Patient taking differently: Take 40 mg by mouth every evening. 12/02/23 03/01/24  Clay Alm LELON, MD  azelastine  (ASTELIN ) 0.1 % nasal spray Place 1 spray into the nose daily as needed (allergies).    [provider]  clobetasol (TEMOVATE) 0.05 % external solution Apply 1 application topically daily as needed (irritation). 03/01/19   [provider]  Coenzyme Q10 (COQ10) 100 MG CAPS Take 100 mg by mouth daily.    [provider]  DPH-Lido-AlHydr-MgHydr-Simeth (FIRST-MOUTHWASH BLM) SUSP Take 5 mLs by mouth daily. 11/30/23   [provider]  EPINEPHrine  0.3 mg/0.3 mL IJ SOAJ injection Inject 0.3 mg into the muscle as needed for anaphylaxis. 12/25/18   [provider]  ezetimibe  (ZETIA ) 10 MG tablet Take 10 mg by mouth daily. Patient not taking: Reported on 12/16/2023    [provider]  furosemide  (LASIX ) 20 MG tablet Take 1 tablet (20 mg total) by mouth daily as needed. An may take an additional 20 mg if needed Patient taking differently: Take 20 mg by mouth daily as needed for fluid. 09/03/22   Clay Alm LELON, MD  gabapentin (NEURONTIN) 100 MG capsule Take 100 mg by mouth at bedtime as needed (pain).     [provider]  hydrALAZINE  (APRESOLINE ) 50 MG tablet TAKE (1) TABLET BY MOUTH (3) TIMES DAILY. Patient taking differently: Take 50 mg by  mouth daily. 12/02/22   Anner Alm ORN, MD  isosorbide  mononitrate (IMDUR ) 60 MG 24 hr tablet TAKE (1) TABLET BY MOUTH ONCE DAILY. 12/08/23   Anner Alm ORN, MD  lisinopril  (ZESTRIL ) 40 MG tablet TAKE 1/2 TABLET BY MOUTH ONCE DAILY. 07/10/22   Anner Alm ORN, MD  loperamide  (IMODIUM ) 2 MG capsule Take 1 capsule (2 mg total) by mouth 4 (four) times daily as needed for diarrhea or loose stools. 03/30/19   Theadore Ozell HERO, MD  LOTEMAX 0.5 % OINT Place 1 application into the left eye daily as needed (irritation, dry eyes). 02/15/21   [provider]  magnesium  gluconate (MAGONATE) 500 MG tablet Take 500 mg by mouth daily.    [provider]  meloxicam (MOBIC) 15 MG tablet Take 15 mg by mouth daily as needed for pain.    [provider]  Naltrexone HCl, Pain, (NALTREX) 4.5 MG CAPS Take 4.5 mg by mouth every evening. Patient not taking: Reported on 12/16/2023    [provider]  naproxen sodium (ALEVE) 220 MG tablet Take 440 mg by mouth 2 (two) times daily as needed (pain).    [provider]  OVER THE COUNTER MEDICATION Take 2 Capfuls by mouth daily. HistaQuel (Histamine)    [provider]  OVER THE COUNTER MEDICATION Take 1 capsule by mouth daily. Blood Sugar Formula    [provider]  oxyCODONE  (OXY IR/ROXICODONE ) 5 MG immediate release tablet Take 5 mg by mouth every 4 (four) hours as needed for moderate pain (pain score 4-6). 12/09/23   [provider]  prednisoLONE acetate (PRED FORTE) 1 % ophthalmic suspension Place 1 drop into both eyes 2 (two) times daily as needed (irritation). 03/08/21   [provider]  predniSONE  (DELTASONE ) 10 MG tablet TAKE AS DIRECTED AS NEEDED FOR ARTHRITIS. TAKE 3 TABLETS FOR 2 DAYS, THEN 2 TABLETS FOR 1 DAY, THEN 1 TABLET FOR 1 DAY. Patient  taking differently: Take 10 mg by mouth See admin instructions. TAKE AS DIRECTED AS NEEDED FOR ARTHRITIS. TAKE 3 TABLETS FOR 2 DAYS, THEN 2 TABLETS FOR 1 DAY, THEN 1 TABLET FOR 1 DAY. 01/16/23   Anner Alm ORN, MD  spironolactone  (ALDACTONE ) 25 MG tablet Take 1 tablet (25 mg total) by mouth daily. 09/03/22   Anner Alm ORN, MD  tobramycin (TOBREX) 0.3 % ophthalmic solution Place 1 drop into both eyes daily as needed (for eye irritation). 02/20/21   [provider]  triazolam  (HALCION ) 0.125 MG tablet Take 1.5 tablets (0.1875 mg total) by mouth at bedtime. Pt takes 1.5 tabs at bedtime Patient taking differently: Take 0.25 mg by mouth at bedtime. 08/29/17   Anner Alm ORN, MD  valACYclovir (VALTREX) 500 MG tablet Take 500 mg by mouth daily. 11/28/23 11/27/24  [provider]    Family History Family History  Problem Relation Age of Onset   Emphysema Father    Lung cancer Paternal Uncle        x 2 smokers   Colon cancer Neg Hx     Social History Social History   Tobacco Use   Smoking status: Former    Current packs/day: 0.00    Average packs/day: 2.0 packs/day for 28.0 years (56.0 ttl pk-yrs)    Types: Cigarettes    Start date: 07/29/1952    Quit date: 07/29/1980    Years since quitting: 43.4   Smokeless tobacco: Never  Vaping Use   Vaping status: Never Used  Substance Use Topics   Alcohol  use: Yes    Alcohol/week: 2.0 standard drinks of alcohol    Types: 1 Glasses of wine, 1 Standard drinks or equivalent per week    Comment:  wine or whiskey occasionally   Drug use: No     Allergies   Apremilast, Aspirin , Leflunomide, Methotrexate, Rosuvastatin calcium , and Other   Review of Systems Review of Systems Per HPI  Physical Exam Triage Vital Signs ED Triage Vitals  Encounter Vitals Group     BP 01/17/24 0832 (!) 167/76     Girls Systolic BP Percentile --      Girls Diastolic BP Percentile --      Boys Systolic BP Percentile --      Boys Diastolic BP  Percentile --      Pulse Rate 01/17/24 0832 62     Resp 01/17/24 0832 18     Temp 01/17/24 0832 97.9 F (36.6 C)     Temp Source 01/17/24 0832 Oral     SpO2 01/17/24 0832 92 %     Weight --      Height --      Head Circumference --      Peak Flow --      Pain Score 01/17/24 0835 8     Pain Loc --      Pain Education --      Exclude from Growth Chart --    No data found.  Updated Vital Signs BP (!) 167/76 (BP Location: Right Arm)   Pulse 62   Temp 97.9 F (36.6 C) (Oral)   Resp 18   SpO2 92%   Visual Acuity Right Eye Distance:   Left Eye Distance:   Bilateral Distance:    Right Eye Near:   Left Eye Near:    Bilateral Near:     Physical Exam Vitals and nursing note reviewed.  Constitutional:      Appearance: He is not ill-appearing or toxic-appearing.  HENT:     Head: Normocephalic and atraumatic.     Right Ear: Hearing and external ear normal.     Left Ear: Hearing and external ear normal.     Nose: Nose normal.     Mouth/Throat:     Lips: Pink.   Eyes:     General: Lids are normal. Vision grossly intact. Gaze aligned appropriately.     Extraocular Movements: Extraocular movements intact.     Conjunctiva/sclera: Conjunctivae normal.   Pulmonary:     Effort: Pulmonary effort is normal.   Musculoskeletal:     Right forearm: Normal.     Left forearm: Normal.     Right wrist: Normal.     Left wrist: Swelling (Diffuse soft tissue swelling of the left wrist), tenderness (Tender to palpation over the radial portion of the left wrist) and bony tenderness present. No deformity, effusion, lacerations, snuff box tenderness or crepitus. Decreased range of motion. Normal pulse.     Right hand: Normal.     Left hand: Swelling, laceration (Superficial abrasion (nonbleeding) to the dorsum of the left hand), tenderness (Diffuse tenderness over the dorsum of the left hand) and bony tenderness present. No deformity. Decreased range of motion (Able to make a loose fist).  Normal strength (5/5 strength against resistance with flexion and extension of all 5 fingers of the left hand). Normal sensation (Sensation intact to distal all 5 fingers of the left hand). There is no disruption of two-point discrimination. Normal capillary refill (Less than 2 cap refill). Normal pulse (+2 left radial  pulse).       Hands:     Cervical back: Neck supple.   Skin:    General: Skin is warm and dry.     Capillary Refill: Capillary refill takes less than 2 seconds.     Findings: No rash.   Neurological:     General: No focal deficit present.     Mental Status: He is alert and oriented to person, place, and time. Mental status is at baseline.     Cranial Nerves: No dysarthria or facial asymmetry.   Psychiatric:        Mood and Affect: Mood normal.        Speech: Speech normal.        Behavior: Behavior normal.        Thought Content: Thought content normal.        Judgment: Judgment normal.      UC Treatments / Results  Labs (all labs ordered are listed, but only abnormal results are displayed) Labs Reviewed - No data to display  EKG   Radiology No results found.  Procedures Procedures (including critical care time)  Medications Ordered in UC Medications  Tdap (BOOSTRIX ) injection 0.5 mL (has no administration in time range)    Initial Impression / Assessment and Plan / UC Course  I have reviewed the triage vital signs and the nursing notes.  Pertinent labs & imaging results that were available during my care of the patient were reviewed by me and considered in my medical decision making (see chart for details).   1.  Left wrist pain, cellulitis of left hand, type 2 diabetes with other skin complication Presentation is consistent with cellulitis of the left hand secondary to injury/abrasion of the left hand that happened 1 to 2 days ago.  He is a type II diabetic and is at risk for delayed wound healing. Augmentin  twice daily for 7 days ordered to treat  cellulitis.  Denies history of MRSA infection.  RICE recommended. Left wrist brace applied in clinic. Tdap updated in clinic today.  Unfortunately, we do not have an x-ray tech today and therefore do not have imaging.  Left hand x-ray and left wrist x-ray have been ordered to be completed at [facility] emergency department.  Staff will call if imaging shows any acute bony abnormality requiring change in treatment plan. Otherwise, recommend follow-up with hand specialist via walking referral on after visit summary as needed in the next 5 to 7 days. Additionally recommend follow-up with PCP in 5-7 days for re-check. ER return precautions discussed.  Counseled patient on potential for adverse effects with medications prescribed/recommended today, strict ER and return-to-clinic precautions discussed, patient verbalized understanding.    Final Clinical Impressions(s) / UC Diagnoses   Final diagnoses:  Left wrist pain  Type 2 diabetes mellitus with other skin complication, without long-term current use of insulin  (HCC)  Cellulitis of left hand     Discharge Instructions      Please go to [facility] and have your x-ray of your left wrist performed. I will call you if you have any broken bones or if the results of the x-ray change are currently treatment plan.  Please wear the wrist brace 24/7 for compression and stability of the left wrist.  I am concerned that the wound on the left wrist has now become infected causing diffuse redness, warmth, and swelling to the left hand.  Please take Augmentin  antibiotic with breakfast and with dinner for the next 7  days.  Rest, elevate, and ice the left wrist to reduce swelling and pain.   If you develop worsening redness, swelling, fever, chills, or numbness/tingling to the fingers of the left hand, please go to the emergency room. Follow-up with the hand specialist listed on your paperwork in the next 5-7 days for  re-check. Additionally, schedule an appointment with your primary care provider for re-check in the next 5-7 days as needed. Feel better!     ED Prescriptions     Medication Sig Dispense Auth. Provider   amoxicillin -clavulanate (AUGMENTIN ) 875-125 MG tablet Take 1 tablet by mouth every 12 (twelve) hours. 14 tablet Enedelia Dorna HERO, FNP      PDMP not reviewed this encounter.   Enedelia Dorna HERO, FNP 01/17/24 0940    Enedelia Dorna HERO, FNP 01/17/24 [phone]

## 2024-01-17 NOTE — Discharge Instructions (Addendum)
 Please go to [facility] and have your x-ray of your left wrist performed. I will call you if you have any broken bones or if the results of the x-ray change are currently treatment plan.  Please wear the wrist brace 24/7 for compression and stability of the left wrist.  I am concerned that the wound on the left wrist has now become infected causing diffuse redness, warmth, and swelling to the left hand.  Please take Augmentin  antibiotic with breakfast and with dinner for the next 7 days.  Rest, elevate, and ice the left wrist to reduce swelling and pain.   If you develop worsening redness, swelling, fever, chills, or numbness/tingling to the fingers of the left hand, please go to the emergency room. Follow-up with the hand specialist listed on your paperwork in the next 5-7 days for re-check. Additionally, schedule an appointment with your primary care provider for re-check in the next 5-7 days as needed. Feel better!

## 2024-01-17 NOTE — ED Triage Notes (Signed)
 Pt reports he hit his left hand on something x 2 days. States hand is swollen and broke the skin

## 2024-01-18 ENCOUNTER — Inpatient Hospital Stay (HOSPITAL_COMMUNITY)
Admission: EM | Admit: 2024-01-18 | Discharge: 2024-01-20 | DRG: 871 | Disposition: A | Discharge status: Home / Self Care | Attending: Family Medicine | Admitting: Family Medicine

## 2024-01-18 ENCOUNTER — Inpatient Hospital Stay (HOSPITAL_COMMUNITY)

## 2024-01-18 ENCOUNTER — Emergency Department (HOSPITAL_COMMUNITY)

## 2024-01-18 ENCOUNTER — Other Ambulatory Visit: Payer: Self-pay

## 2024-01-18 ENCOUNTER — Encounter (HOSPITAL_COMMUNITY): Payer: Self-pay | Admitting: Emergency Medicine

## 2024-01-18 DIAGNOSIS — Z85820 Personal history of malignant melanoma of skin: Secondary | ICD-10-CM

## 2024-01-18 DIAGNOSIS — Z9152 Personal history of nonsuicidal self-harm: Secondary | ICD-10-CM

## 2024-01-18 DIAGNOSIS — I110 Hypertensive heart disease with heart failure: Secondary | ICD-10-CM | POA: Diagnosis present

## 2024-01-18 DIAGNOSIS — I252 Old myocardial infarction: Secondary | ICD-10-CM

## 2024-01-18 DIAGNOSIS — G9341 Metabolic encephalopathy: Secondary | ICD-10-CM | POA: Diagnosis present

## 2024-01-18 DIAGNOSIS — W1830XA Fall on same level, unspecified, initial encounter: Secondary | ICD-10-CM | POA: Diagnosis not present

## 2024-01-18 DIAGNOSIS — K1230 Oral mucositis (ulcerative), unspecified: Secondary | ICD-10-CM | POA: Diagnosis present

## 2024-01-18 DIAGNOSIS — R21 Rash and other nonspecific skin eruption: Secondary | ICD-10-CM

## 2024-01-18 DIAGNOSIS — Z96653 Presence of artificial knee joint, bilateral: Secondary | ICD-10-CM | POA: Diagnosis present

## 2024-01-18 DIAGNOSIS — R4182 Altered mental status, unspecified: Secondary | ICD-10-CM | POA: Diagnosis not present

## 2024-01-18 DIAGNOSIS — Z8673 Personal history of transient ischemic attack (TIA), and cerebral infarction without residual deficits: Secondary | ICD-10-CM

## 2024-01-18 DIAGNOSIS — I5032 Chronic diastolic (congestive) heart failure: Secondary | ICD-10-CM | POA: Diagnosis present

## 2024-01-18 DIAGNOSIS — I672 Cerebral atherosclerosis: Secondary | ICD-10-CM | POA: Diagnosis not present

## 2024-01-18 DIAGNOSIS — I2510 Atherosclerotic heart disease of native coronary artery without angina pectoris: Secondary | ICD-10-CM | POA: Diagnosis present

## 2024-01-18 DIAGNOSIS — Z9841 Cataract extraction status, right eye: Secondary | ICD-10-CM

## 2024-01-18 DIAGNOSIS — I358 Other nonrheumatic aortic valve disorders: Secondary | ICD-10-CM | POA: Diagnosis present

## 2024-01-18 DIAGNOSIS — Z961 Presence of intraocular lens: Secondary | ICD-10-CM | POA: Diagnosis present

## 2024-01-18 DIAGNOSIS — J9611 Chronic respiratory failure with hypoxia: Secondary | ICD-10-CM | POA: Diagnosis not present

## 2024-01-18 DIAGNOSIS — L03114 Cellulitis of left upper limb: Secondary | ICD-10-CM | POA: Diagnosis present

## 2024-01-18 DIAGNOSIS — J9621 Acute and chronic respiratory failure with hypoxia: Secondary | ICD-10-CM | POA: Diagnosis present

## 2024-01-18 DIAGNOSIS — N401 Enlarged prostate with lower urinary tract symptoms: Secondary | ICD-10-CM | POA: Diagnosis present

## 2024-01-18 DIAGNOSIS — Z888 Allergy status to other drugs, medicaments and biological substances status: Secondary | ICD-10-CM

## 2024-01-18 DIAGNOSIS — L03119 Cellulitis of unspecified part of limb: Secondary | ICD-10-CM

## 2024-01-18 DIAGNOSIS — E2740 Unspecified adrenocortical insufficiency: Secondary | ICD-10-CM | POA: Diagnosis present

## 2024-01-18 DIAGNOSIS — J439 Emphysema, unspecified: Secondary | ICD-10-CM | POA: Diagnosis present

## 2024-01-18 DIAGNOSIS — I700 Atherosclerosis of aorta: Secondary | ICD-10-CM | POA: Diagnosis present

## 2024-01-18 DIAGNOSIS — K5730 Diverticulosis of large intestine without perforation or abscess without bleeding: Secondary | ICD-10-CM | POA: Diagnosis present

## 2024-01-18 DIAGNOSIS — Z79899 Other long term (current) drug therapy: Secondary | ICD-10-CM

## 2024-01-18 DIAGNOSIS — Z801 Family history of malignant neoplasm of trachea, bronchus and lung: Secondary | ICD-10-CM

## 2024-01-18 DIAGNOSIS — E785 Hyperlipidemia, unspecified: Secondary | ICD-10-CM | POA: Diagnosis present

## 2024-01-18 DIAGNOSIS — Z87891 Personal history of nicotine dependence: Secondary | ICD-10-CM

## 2024-01-18 DIAGNOSIS — M1712 Unilateral primary osteoarthritis, left knee: Secondary | ICD-10-CM | POA: Diagnosis present

## 2024-01-18 DIAGNOSIS — R6520 Severe sepsis without septic shock: Secondary | ICD-10-CM

## 2024-01-18 DIAGNOSIS — R9082 White matter disease, unspecified: Secondary | ICD-10-CM | POA: Diagnosis not present

## 2024-01-18 DIAGNOSIS — E860 Dehydration: Secondary | ICD-10-CM | POA: Diagnosis present

## 2024-01-18 DIAGNOSIS — Z886 Allergy status to analgesic agent status: Secondary | ICD-10-CM

## 2024-01-18 DIAGNOSIS — E876 Hypokalemia: Secondary | ICD-10-CM | POA: Diagnosis present

## 2024-01-18 DIAGNOSIS — Z7952 Long term (current) use of systemic steroids: Secondary | ICD-10-CM

## 2024-01-18 DIAGNOSIS — E118 Type 2 diabetes mellitus with unspecified complications: Secondary | ICD-10-CM | POA: Diagnosis present

## 2024-01-18 DIAGNOSIS — A419 Sepsis, unspecified organism: Principal | ICD-10-CM | POA: Diagnosis present

## 2024-01-18 DIAGNOSIS — C7931 Secondary malignant neoplasm of brain: Secondary | ICD-10-CM | POA: Diagnosis present

## 2024-01-18 DIAGNOSIS — I872 Venous insufficiency (chronic) (peripheral): Secondary | ICD-10-CM | POA: Diagnosis present

## 2024-01-18 DIAGNOSIS — Z955 Presence of coronary angioplasty implant and graft: Secondary | ICD-10-CM

## 2024-01-18 DIAGNOSIS — J9811 Atelectasis: Secondary | ICD-10-CM | POA: Diagnosis present

## 2024-01-18 DIAGNOSIS — R338 Other retention of urine: Secondary | ICD-10-CM | POA: Diagnosis not present

## 2024-01-18 DIAGNOSIS — Z8551 Personal history of malignant neoplasm of bladder: Secondary | ICD-10-CM

## 2024-01-18 DIAGNOSIS — L409 Psoriasis, unspecified: Secondary | ICD-10-CM | POA: Diagnosis present

## 2024-01-18 DIAGNOSIS — Z825 Family history of asthma and other chronic lower respiratory diseases: Secondary | ICD-10-CM

## 2024-01-18 DIAGNOSIS — Z791 Long term (current) use of non-steroidal anti-inflammatories (NSAID): Secondary | ICD-10-CM

## 2024-01-18 LAB — CBC WITH DIFFERENTIAL/PLATELET
Abs Immature Granulocytes: 0.04 10*3/uL (ref 0.00–0.07)
Basophils Absolute: 0 10*3/uL (ref 0.0–0.1)
Basophils Relative: 0 %
Eosinophils Absolute: 0 10*3/uL (ref 0.0–0.5)
Eosinophils Relative: 1 %
HCT: 37.6 % — ABNORMAL LOW (ref 39.0–52.0)
Hemoglobin: 12.2 g/dL — ABNORMAL LOW (ref 13.0–17.0)
Immature Granulocytes: 1 %
Lymphocytes Relative: 7 %
Lymphs Abs: 0.5 10*3/uL — ABNORMAL LOW (ref 0.7–4.0)
MCH: 29.7 pg (ref 26.0–34.0)
MCHC: 32.4 g/dL (ref 30.0–36.0)
MCV: 91.5 fL (ref 80.0–100.0)
Monocytes Absolute: 1.4 10*3/uL — ABNORMAL HIGH (ref 0.1–1.0)
Monocytes Relative: 18 %
Neutro Abs: 5.7 10*3/uL (ref 1.7–7.7)
Neutrophils Relative %: 73 %
Platelets: 249 10*3/uL (ref 150–400)
RBC: 4.11 MIL/uL — ABNORMAL LOW (ref 4.22–5.81)
RDW: 13.3 % (ref 11.5–15.5)
WBC: 7.7 10*3/uL (ref 4.0–10.5)
nRBC: 0 % (ref 0.0–0.2)

## 2024-01-18 LAB — VITAMIN B12: Vitamin B-12: 1197 pg/mL — ABNORMAL HIGH (ref 180–914)

## 2024-01-18 LAB — BASIC METABOLIC PANEL WITH GFR
Anion gap: 9 (ref 5–15)
BUN: 11 mg/dL (ref 8–23)
CO2: 25 mmol/L (ref 22–32)
Calcium: 8.6 mg/dL — ABNORMAL LOW (ref 8.9–10.3)
Chloride: 103 mmol/L (ref 98–111)
Creatinine, Ser: 0.67 mg/dL (ref 0.61–1.24)
GFR, Estimated: >60 mL/min (ref >60)
Glucose, Bld: 132 mg/dL — ABNORMAL HIGH (ref 70–99)
Potassium: 3.3 mmol/L — ABNORMAL LOW (ref 3.5–5.1)
Sodium: 137 mmol/L (ref 135–145)

## 2024-01-18 LAB — GLUCOSE, CAPILLARY
Glucose-Capillary: 149 mg/dL — ABNORMAL HIGH (ref 70–99)
Glucose-Capillary: 128 mg/dL — ABNORMAL HIGH (ref 70–99)
Glucose-Capillary: 158 mg/dL — ABNORMAL HIGH (ref 70–99)
Glucose-Capillary: 152 mg/dL — ABNORMAL HIGH (ref 70–99)

## 2024-01-18 LAB — CBC
HCT: 40.3 % (ref 39.0–52.0)
Hemoglobin: 13 g/dL (ref 13.0–17.0)
MCH: 29.4 pg (ref 26.0–34.0)
MCHC: 32.3 g/dL (ref 30.0–36.0)
MCV: 91.2 fL (ref 80.0–100.0)
Platelets: 239 10*3/uL (ref 150–400)
RBC: 4.42 MIL/uL (ref 4.22–5.81)
RDW: 13.2 % (ref 11.5–15.5)
WBC: 8.4 10*3/uL (ref 4.0–10.5)
nRBC: 0 % (ref 0.0–0.2)

## 2024-01-18 LAB — URINALYSIS, W/ REFLEX TO CULTURE (INFECTION SUSPECTED)
Bilirubin Urine: NEGATIVE
Glucose, UA: >=500 mg/dL — AB
Hgb urine dipstick: NEGATIVE
Ketones, ur: NEGATIVE mg/dL
Leukocytes,Ua: NEGATIVE
Nitrite: NEGATIVE
Protein, ur: 30 mg/dL — AB
Specific Gravity, Urine: 1.011 (ref 1.005–1.030)
pH: 8 (ref 5.0–8.0)

## 2024-01-18 LAB — COMPREHENSIVE METABOLIC PANEL WITH GFR
ALT: 15 U/L (ref 0–44)
AST: 14 U/L — ABNORMAL LOW (ref 15–41)
Albumin: 3.5 g/dL (ref 3.5–5.0)
Alkaline Phosphatase: 70 U/L (ref 38–126)
Anion gap: 10 (ref 5–15)
BUN: 12 mg/dL (ref 8–23)
CO2: 28 mmol/L (ref 22–32)
Calcium: 8.9 mg/dL (ref 8.9–10.3)
Chloride: 98 mmol/L (ref 98–111)
Creatinine, Ser: 0.77 mg/dL (ref 0.61–1.24)
GFR, Estimated: >60 mL/min (ref >60)
Glucose, Bld: 183 mg/dL — ABNORMAL HIGH (ref 70–99)
Potassium: 3.7 mmol/L (ref 3.5–5.1)
Sodium: 136 mmol/L (ref 135–145)
Total Bilirubin: 1.4 mg/dL — ABNORMAL HIGH (ref 0.0–1.2)
Total Protein: 6.8 g/dL (ref 6.5–8.1)

## 2024-01-18 LAB — RESP PANEL BY RT-PCR (RSV, FLU A&B, COVID)  RVPGX2
Influenza A by PCR: NEGATIVE
Influenza B by PCR: NEGATIVE
Resp Syncytial Virus by PCR: NEGATIVE
SARS Coronavirus 2 by RT PCR: NEGATIVE

## 2024-01-18 LAB — CBG MONITORING, ED: Glucose-Capillary: 176 mg/dL — ABNORMAL HIGH (ref 70–99)

## 2024-01-18 LAB — LACTIC ACID, PLASMA: Lactic Acid, Venous: 1.4 mmol/L (ref 0.5–1.9)

## 2024-01-18 LAB — PROTIME-INR
INR: 1.1 (ref 0.8–1.2)
Prothrombin Time: 14 s (ref 11.4–15.2)

## 2024-01-18 LAB — MAGNESIUM: Magnesium: 2 mg/dL (ref 1.7–2.4)

## 2024-01-18 LAB — PHOSPHORUS: Phosphorus: 3.7 mg/dL (ref 2.5–4.6)

## 2024-01-18 LAB — PROCALCITONIN: Procalcitonin: <0.1 ng/mL

## 2024-01-18 LAB — TSH: TSH: 0.948 u[IU]/mL (ref 0.350–4.500)

## 2024-01-18 LAB — URIC ACID: Uric Acid, Serum: 2.5 mg/dL — ABNORMAL LOW (ref 3.7–8.6)

## 2024-01-18 LAB — C-REACTIVE PROTEIN: CRP: 11.4 mg/dL — ABNORMAL HIGH (ref <1.0)

## 2024-01-18 LAB — SEDIMENTATION RATE: Sed Rate: 24 mm/h — ABNORMAL HIGH (ref 0–20)

## 2024-01-18 MED ORDER — MELATONIN 3 MG PO TABS: 6.0000 mg | ORAL_TABLET | Freq: Every evening | ORAL | Status: DC | PRN

## 2024-01-18 MED ORDER — VANCOMYCIN HCL 1250 MG/250ML IV SOLN
1250.0000 mg | Freq: Two times a day (BID) | INTRAVENOUS | Status: DC
  Administered 2024-01-18 – 2024-01-20 (×4): 1250 mg via INTRAVENOUS
  Filled 2024-01-18 (×4): qty 250

## 2024-01-18 MED ORDER — HYDROCORTISONE SOD SUC (PF) 100 MG IJ SOLR
50.0000 mg | Freq: Two times a day (BID) | INTRAMUSCULAR | Status: DC
  Administered 2024-01-18 – 2024-01-19 (×3): 50 mg via INTRAVENOUS
  Filled 2024-01-18 (×3): qty 2

## 2024-01-18 MED ORDER — TRIAZOLAM 0.25 MG PO TABS: 0.2500 mg | ORAL_TABLET | Freq: Every evening | ORAL | Status: DC | PRN

## 2024-01-18 MED ORDER — POTASSIUM CHLORIDE CRYS ER 20 MEQ PO TBCR
40.0000 meq | EXTENDED_RELEASE_TABLET | Freq: Once | ORAL | Status: DC
  Filled 2024-01-18: qty 2

## 2024-01-18 MED ORDER — ISOSORBIDE MONONITRATE ER 60 MG PO TB24
60.0000 mg | ORAL_TABLET | Freq: Every day | ORAL | Status: DC
  Administered 2024-01-18 – 2024-01-20 (×3): 60 mg via ORAL
  Filled 2024-01-18 (×3): qty 1

## 2024-01-18 MED ORDER — POLYETHYLENE GLYCOL 3350 17 G PO PACK: 17.0000 g | PACK | Freq: Every day | ORAL | Status: DC | PRN

## 2024-01-18 MED ORDER — LACTATED RINGERS IV BOLUS
500.0000 mL | Freq: Once | INTRAVENOUS | Status: AC
  Administered 2024-01-18: 500 mL via INTRAVENOUS

## 2024-01-18 MED ORDER — PREDNISONE 10 MG PO TABS
10.0000 mg | ORAL_TABLET | Freq: Every day | ORAL | Status: DC
  Filled 2024-01-18: qty 1

## 2024-01-18 MED ORDER — SODIUM CHLORIDE 0.9% FLUSH
3.0000 mL | Freq: Two times a day (BID) | INTRAVENOUS | Status: DC
  Administered 2024-01-18 – 2024-01-19 (×4): 3 mL via INTRAVENOUS

## 2024-01-18 MED ORDER — IOHEXOL 300 MG/ML  SOLN
100.0000 mL | Freq: Once | INTRAMUSCULAR | Status: AC | PRN
  Administered 2024-01-18: 100 mL via INTRAVENOUS

## 2024-01-18 MED ORDER — VANCOMYCIN HCL IN DEXTROSE 1-5 GM/200ML-% IV SOLN
1000.0000 mg | Freq: Once | INTRAVENOUS | Status: DC
  Filled 2024-01-18: qty 200

## 2024-01-18 MED ORDER — ACETAMINOPHEN 500 MG PO TABS
1000.0000 mg | ORAL_TABLET | Freq: Once | ORAL | Status: AC
  Administered 2024-01-18: 1000 mg via ORAL
  Filled 2024-01-18: qty 2

## 2024-01-18 MED ORDER — HYDRALAZINE HCL 20 MG/ML IJ SOLN: 5.0000 mg | Freq: Four times a day (QID) | INTRAMUSCULAR | Status: DC | PRN

## 2024-01-18 MED ORDER — OXYCODONE HCL 5 MG PO TABS
5.0000 mg | ORAL_TABLET | Freq: Four times a day (QID) | ORAL | Status: DC | PRN
  Administered 2024-01-18 – 2024-01-19 (×2): 5 mg via ORAL
  Filled 2024-01-18 (×2): qty 1

## 2024-01-18 MED ORDER — ALBUTEROL SULFATE (2.5 MG/3ML) 0.083% IN NEBU: 2.5000 mg | INHALATION_SOLUTION | RESPIRATORY_TRACT | Status: DC | PRN

## 2024-01-18 MED ORDER — MELATONIN 3 MG PO TABS
6.0000 mg | ORAL_TABLET | Freq: Every day | ORAL | Status: DC
  Administered 2024-01-18 – 2024-01-19 (×2): 6 mg via ORAL
  Filled 2024-01-18 (×2): qty 2

## 2024-01-18 MED ORDER — LACTATED RINGERS IV BOLUS (SEPSIS)
1000.0000 mL | Freq: Once | INTRAVENOUS | Status: AC
  Administered 2024-01-18: 1000 mL via INTRAVENOUS

## 2024-01-18 MED ORDER — SODIUM CHLORIDE 0.9 % IV SOLN
2.0000 g | Freq: Once | INTRAVENOUS | Status: AC
  Administered 2024-01-18: 2 g via INTRAVENOUS
  Filled 2024-01-18: qty 12.5

## 2024-01-18 MED ORDER — METRONIDAZOLE 500 MG/100ML IV SOLN: 500.0000 mg | Freq: Two times a day (BID) | INTRAVENOUS | Status: DC

## 2024-01-18 MED ORDER — LACTATED RINGERS IV SOLN: INTRAVENOUS | Status: DC

## 2024-01-18 MED ORDER — ONDANSETRON HCL 4 MG/2ML IJ SOLN: 4.0000 mg | Freq: Four times a day (QID) | INTRAMUSCULAR | Status: DC | PRN

## 2024-01-18 MED ORDER — VANCOMYCIN HCL 2000 MG/400ML IV SOLN
2000.0000 mg | Freq: Once | INTRAVENOUS | Status: AC
  Administered 2024-01-18: 2000 mg via INTRAVENOUS
  Filled 2024-01-18: qty 400

## 2024-01-18 MED ORDER — CHLORHEXIDINE GLUCONATE CLOTH 2 % EX PADS
6.0000 | MEDICATED_PAD | Freq: Every day | CUTANEOUS | Status: DC
  Administered 2024-01-18 – 2024-01-19 (×2): 6 via TOPICAL

## 2024-01-18 MED ORDER — AMLODIPINE BESYLATE 5 MG PO TABS
10.0000 mg | ORAL_TABLET | Freq: Every day | ORAL | Status: DC
  Administered 2024-01-18 – 2024-01-20 (×3): 10 mg via ORAL
  Filled 2024-01-18 (×3): qty 2

## 2024-01-18 MED ORDER — TAMSULOSIN HCL 0.4 MG PO CAPS
0.4000 mg | ORAL_CAPSULE | Freq: Every day | ORAL | Status: DC
  Administered 2024-01-18 – 2024-01-20 (×3): 0.4 mg via ORAL
  Filled 2024-01-18 (×3): qty 1

## 2024-01-18 MED ORDER — METRONIDAZOLE 500 MG/100ML IV SOLN
500.0000 mg | Freq: Two times a day (BID) | INTRAVENOUS | Status: DC
  Administered 2024-01-18 – 2024-01-19 (×4): 500 mg via INTRAVENOUS
  Filled 2024-01-18 (×4): qty 100

## 2024-01-18 MED ORDER — HALOPERIDOL LACTATE 5 MG/ML IJ SOLN
5.0000 mg | Freq: Once | INTRAMUSCULAR | Status: AC
  Administered 2024-01-18: 5 mg via INTRAVENOUS
  Filled 2024-01-18: qty 1

## 2024-01-18 MED ORDER — OXYCODONE HCL 5 MG PO TABS: 5.0000 mg | ORAL_TABLET | ORAL | Status: DC | PRN

## 2024-01-18 MED ORDER — METRONIDAZOLE 500 MG/100ML IV SOLN
500.0000 mg | Freq: Once | INTRAVENOUS | Status: AC
  Administered 2024-01-18: 500 mg via INTRAVENOUS
  Filled 2024-01-18: qty 100

## 2024-01-18 MED ORDER — SODIUM CHLORIDE 0.9 % IV SOLN
2.0000 g | Freq: Three times a day (TID) | INTRAVENOUS | Status: DC
  Administered 2024-01-18 – 2024-01-20 (×6): 2 g via INTRAVENOUS
  Filled 2024-01-18 (×7): qty 12.5

## 2024-01-18 MED ORDER — POTASSIUM CHLORIDE 10 MEQ/100ML IV SOLN
10.0000 meq | INTRAVENOUS | Status: AC
  Administered 2024-01-18 (×3): 10 meq via INTRAVENOUS
  Filled 2024-01-18 (×3): qty 100

## 2024-01-18 MED ORDER — PANTOPRAZOLE SODIUM 40 MG IV SOLR
40.0000 mg | Freq: Two times a day (BID) | INTRAVENOUS | Status: DC
  Administered 2024-01-18 – 2024-01-19 (×4): 40 mg via INTRAVENOUS
  Filled 2024-01-18 (×5): qty 10

## 2024-01-18 MED ORDER — TRIAZOLAM 0.25 MG PO TABS: 0.2500 mg | ORAL_TABLET | Freq: Every day | ORAL | Status: DC

## 2024-01-18 MED ORDER — INSULIN ASPART 100 UNIT/ML IJ SOLN
0.0000 [IU] | Freq: Three times a day (TID) | INTRAMUSCULAR | Status: DC
  Administered 2024-01-18 – 2024-01-19 (×2): 1 [IU] via SUBCUTANEOUS
  Administered 2024-01-19: 2 [IU] via SUBCUTANEOUS

## 2024-01-18 MED ORDER — ATORVASTATIN CALCIUM 40 MG PO TABS
40.0000 mg | ORAL_TABLET | Freq: Every evening | ORAL | Status: DC
  Administered 2024-01-18 – 2024-01-19 (×2): 40 mg via ORAL
  Filled 2024-01-18 (×2): qty 1

## 2024-01-18 MED ORDER — ACETAMINOPHEN 500 MG PO TABS: 1000.0000 mg | ORAL_TABLET | Freq: Four times a day (QID) | ORAL | Status: DC | PRN

## 2024-01-18 NOTE — Progress Notes (Signed)
   01/18/24 1820  TOC Brief Assessment  Insurance and Status Reviewed  Patient has primary care physician Yes  Home environment has been reviewed From home c/wife.  Prior level of function: Assisted.  Prior/Current Home Services No current home services  Social Drivers of Health Review SDOH reviewed no interventions necessary  Readmission risk has been reviewed Yes  Transition of care needs no transition of care needs at this time   Transition of Care Department [facility]) has reviewed patient and no TOC needs have been identified at this time. We will continue to monitor patient advancement through interdisciplinary progression rounds. If new patient transition needs arise, please place a TOC consult.

## 2024-01-18 NOTE — ED Triage Notes (Signed)
 Pt bib EMS after family called reporting decreased oral intake x 4 days. Family believes pt is dehydrated.

## 2024-01-18 NOTE — ED Notes (Signed)
 drained from pt's bladder via in and out. Pt now relaxed and sleeping.

## 2024-01-18 NOTE — H&P (Addendum)
 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 Patient not able to provide significant history. Wife and daughter report that he has had decreased oral intake over the past few days. And over the same time period developed pain and swelling at his L wrist / hand. Family are not sure about the details, unclear if he had fallen or injured before this evening. Seen by urgent care yesterday and felt may be cellulitis; had reported hitting L hand on a metal machine causing abrasion to dorsum of hand. so Rx'd course of Augmentin  although had not picked up yet. Family also noted a round erythematous patch over his right flank, were wondering about an insect bite although none seen. Also developed nausea but no vomiting, no abd pain or diarrhea. Has chronic mucositis from suspected IRAE of immunotherapy. Otherwise no recent illness, no fevers noted prior to ED, no chills, chest pain, SOB, cough, cold symptoms, other rashes than mentioned. Had become slightly confused which happens when dehydrated. But around midnight had a ground level fall, think he hit his head. And needed lots of help to get up. After the fall family decided to call EMS and bring him in.   Otherwise family notes that he typically has low O2 sats in the high 80s on prior evaluations. He is apparently very against having oxygen  therapy but family are not sure why he is so against it.   Review of Systems:  ROS complete and negative except as marked above   Allergies  Allergen Reactions   Apremilast Other (See Comments)    unknown   Aspirin  Other (See Comments)    Was told to not take this due to having macular degeneration   Leflunomide Other (See Comments)    unknown   Methotrexate Other (See Comments)    unknown   Rosuvastatin Calcium  Other (See Comments)    Aches and cramps   Other Rash and Other (See Comments)    Red meat = develops a rash on the back    Prior to Admission medications   Medication Sig Start Date End Date Taking? Authorizing Provider  acetaminophen  (TYLENOL ) 650 MG CR tablet Take 1,300 mg by mouth every 8 (eight) hours as needed for pain.    [provider]  amLODipine  (NORVASC ) 10 MG tablet TAKE (1) TABLET BY MOUTH ONCE DAILY. 12/29/23   Anner Alm LELON, MD  amoxicillin -clavulanate (AUGMENTIN ) 875-125 MG tablet Take 1 tablet by mouth every 12 (twelve) hours. 01/17/24   Enedelia Dorna HERO, FNP  Apremilast (OTEZLA) 30 MG TABS Take 1 tablet by mouth 2 (two) times daily. Take as directed 05/23/22   [provider]  ascorbic acid  (VITAMIN C) 1000 MG tablet Take 1,000 mg by mouth daily. 10/10/17   [provider]  atorvastatin  (LIPITOR) 40 MG tablet Take 1 tablet (40 mg total) by mouth daily. Patient taking differently: Take  40 mg by mouth every evening. 12/02/23 03/01/24  Anner Alm ORN, MD  azelastine  (ASTELIN ) 0.1 % nasal spray Place 1 spray into the nose daily as needed (allergies).    [provider]  clobetasol (TEMOVATE) 0.05 % external solution Apply 1 application topically daily as needed (irritation). 03/01/19   [provider]  Coenzyme Q10 (COQ10) 100 MG CAPS Take 100 mg by mouth daily.    [provider]  DPH-Lido-AlHydr-MgHydr-Simeth (FIRST-MOUTHWASH BLM) SUSP Take 5 mLs by mouth daily. 11/30/23   [provider]   EPINEPHrine  0.3 mg/0.3 mL IJ SOAJ injection Inject 0.3 mg into the muscle as needed for anaphylaxis. 12/25/18   [provider]  ezetimibe  (ZETIA ) 10 MG tablet Take 10 mg by mouth daily. Patient not taking: Reported on 12/16/2023    [provider]  furosemide  (LASIX ) 20 MG tablet Take 1 tablet (20 mg total) by mouth daily as needed. An may take an additional 20 mg if needed Patient taking differently: Take 20 mg by mouth daily as needed for fluid. 09/03/22   Anner Alm ORN, MD  gabapentin (NEURONTIN) 100 MG capsule Take 100 mg by mouth at bedtime as needed (pain).    [provider]  hydrALAZINE  (APRESOLINE ) 50 MG tablet TAKE (1) TABLET BY MOUTH (3) TIMES DAILY. Patient taking differently: Take 50 mg by mouth daily. 12/02/22   Anner Alm ORN, MD  isosorbide  mononitrate (IMDUR ) 60 MG 24 hr tablet TAKE (1) TABLET BY MOUTH ONCE DAILY. 12/08/23   Anner Alm ORN, MD  lisinopril  (ZESTRIL ) 40 MG tablet TAKE 1/2 TABLET BY MOUTH ONCE DAILY. 07/10/22   Anner Alm ORN, MD  loperamide  (IMODIUM ) 2 MG capsule Take 1 capsule (2 mg total) by mouth 4 (four) times daily as needed for diarrhea or loose stools. 03/30/19   Theadore Ozell HERO, MD  LOTEMAX 0.5 % OINT Place 1 application into the left eye daily as needed (irritation, dry eyes). 02/15/21   [provider]  magnesium  gluconate (MAGONATE) 500 MG tablet Take 500 mg by mouth daily.    [provider]  meloxicam (MOBIC) 15 MG tablet Take 15 mg by mouth daily as needed for pain.    [provider]  Naltrexone HCl, Pain, (NALTREX) 4.5 MG CAPS Take 4.5 mg by mouth every evening. Patient not taking: Reported on 12/16/2023    [provider]  naproxen sodium (ALEVE) 220 MG tablet Take 440 mg by mouth 2 (two) times daily as needed (pain).    [provider]  OVER THE COUNTER MEDICATION Take 2 Capfuls by mouth daily. HistaQuel (Histamine)    [provider]  OVER THE COUNTER MEDICATION Take  1 capsule by mouth daily. Blood Sugar Formula    [provider]  oxyCODONE  (OXY IR/ROXICODONE ) 5 MG immediate release tablet Take 5 mg by mouth every 4 (four) hours as needed for moderate pain (pain score 4-6). 12/09/23   [provider]  prednisoLONE acetate (PRED FORTE) 1 % ophthalmic suspension Place 1 drop into both eyes 2 (two) times daily as needed (irritation). 03/08/21   [provider]  predniSONE  (DELTASONE ) 10 MG tablet TAKE AS DIRECTED AS NEEDED FOR ARTHRITIS. TAKE 3 TABLETS FOR 2 DAYS, THEN 2 TABLETS FOR 1 DAY, THEN 1 TABLET FOR 1 DAY. Patient taking differently: Take 10 mg by mouth See admin instructions. TAKE AS DIRECTED AS NEEDED FOR ARTHRITIS. TAKE 3 TABLETS FOR 2 DAYS, THEN 2 TABLETS FOR 1 DAY, THEN 1 TABLET FOR 1 DAY. 01/16/23  Anner Alm ORN, MD  spironolactone  (ALDACTONE ) 25 MG tablet Take 1 tablet (25 mg total) by mouth daily. 09/03/22   Anner Alm ORN, MD  tobramycin (TOBREX) 0.3 % ophthalmic solution Place 1 drop into both eyes daily as needed (for eye irritation). 02/20/21   [provider]  triazolam  (HALCION ) 0.125 MG tablet Take 1.5 tablets (0.1875 mg total) by mouth at bedtime. Pt takes 1.5 tabs at bedtime Patient taking differently: Take 0.25 mg by mouth at bedtime. 08/29/17   Anner Alm ORN, MD  valACYclovir (VALTREX) 500 MG tablet Take 500 mg by mouth daily. 11/28/23 11/27/24  [provider]    Past Medical History:  Diagnosis Date   Allergic rhinitis    Aortic valve sclerosis    Cause of murmur   Arthritis of knee, left, needs total Knee in future with Dr. Melodi  08/07/2011   Bladder cancer (HCC) 11/2005   CAD S/P percutaneous coronary angioplasty 08/07/2011   NEW 08/07/11 cutting balloon PTCA to 95% ostial diag. and 90%  proximal LCX.  prior cutting balloon and to RCA  & ostium of ostial diag; Last Cath 02/2012 - Patent Diag PTCA, RCA & Circ BMS stents. Normal EF & EDP; Myoview  2/14 no evidence of ischemia or infarction    Diabetes mellitus type 2, controlled, with complications (HCC)    Dyslipidemia    History of heart attack 11/2007   5/'09: mild; did not affect the heart muscle; PCI to RCA, Cutting PTCA Diag ostium;   Hypertension    Macular degeneration    Macular degeneration of right eye    Now getting shots.    Melanoma of skin (HCC) ~ 2005   level 2; on my back   Osteoarthritis    Status post right knee arthroplasty, positive for staph infection. Pending right arthroplasty   Portacath in place 08/11/2012   Statin intolerance 08/07/2011   Stroke (HCC) 1980's   had facial strokes; 2; about a year apart; never did find what caused them    Past Surgical History:  Procedure Laterality Date   7-day ZIO PATCH MONITOR  06/2021   Predominant rhythm-sinus w/ bundle branch block/IVCD noted.  HR range 35-136 bpm.  Average 67 bpm. HR 35 bpm @ 1AM. 19 atrial runs: Fastest and longest interval was 27.5 seconds (60 beats) with an average heart rate of 132 bpm, max 54 bpm =>occasionally w/ aberrancy & NOT not noted on diary.  Occasional isolated PACs.  No sustained tachy or bradycardic arrhythmia.  No chronotropic incompetence.   BIOPSY  05/28/2019   Procedure: BIOPSY;  Surgeon: Golda Claudis PENNER, MD;  Location: [facility];  Service: Endoscopy;;  cecum   CATARACT EXTRACTION W/ INTRAOCULAR LENS IMPLANT  ~ 2010   right   CATARACT EXTRACTION W/PHACO Left 11/21/2016   Procedure: CATARACT EXTRACTION PHACO AND INTRAOCULAR LENS PLACEMENT (IOC) CDE - 11.70 ;  Surgeon: Cherene Mania, MD;  Location: [facility];  Service: Ophthalmology;  Laterality: Left;  left   cold cup removal bladder lesion  11/2005   malignant   COLONOSCOPY N/A 12/09/2014   Procedure: COLONOSCOPY;  Surgeon: Claudis PENNER Golda, MD;  Location: [facility];  Service: Endoscopy;  Laterality: N/A;  955 - moved to 8:30 - Ann to notify pt   COLONOSCOPY WITH PROPOFOL  N/A 05/28/2019   Procedure: COLONOSCOPY WITH PROPOFOL ;  Surgeon: Golda Claudis PENNER, MD;   Location: [facility];  Service: Endoscopy;  Laterality: N/A;   COLONOSCOPY WITH PROPOFOL  N/A 03/15/2022   Procedure:  COLONOSCOPY WITH PROPOFOL ;  Surgeon: Eartha Flavors, Toribio, MD;  Location: [facility];  Service: Gastroenterology;  Laterality: N/A;  230 ASA 2   CORONARY ANGIOPLASTY WITH STENT PLACEMENT  11/2007   Mid RCA - Driver BMS 3.5 mm x 15 mm; 2.0 mm Cutting Balloon PTCA - ostial D1   INGUINAL HERNIA REPAIR  ~ 1970   left   KNEE ARTHROSCOPY  02/20/2011   left   LEFT HEART CATHETERIZATION WITH CORONARY ANGIOGRAM N/A 08/07/2011   Procedure: LEFT HEART CATHETERIZATION WITH CORONARY ANGIOGRAM;  Surgeon: Alm LELON Clay, MD;  Location: [facility] CATH LAB;  Service: Cardiovascular;  Laterality: N/A;;; Abnormal TM Myoview  - for exertional throat discomfort: Patent RCA stent, 90% circumflex -- BMS PCI; Ostial D2 95% - PTCA   LEFT HEART CATHETERIZATION WITH CORONARY ANGIOGRAM N/A 03/06/2012   Procedure: LEFT HEART CATHETERIZATION WITH CORONARY ANGIOGRAM;  Surgeon: Alm LELON Clay, MD;  Location: [facility] CATH LAB;  Service: Cardiovascular;;Widely patent RCA and LCx stents. Also patent PTCA site to D1, ~40-50% D2. Normal EF, normal EDP   NM MYOVIEW  LTD  08/29/2021   a) 07/2012: Low risk, EF 49%, improved from prior. Small apical and apical septal defect, fixed likely artifact no skin or infarction.; b)  08/2021: LOW RISK.  EF 60 to 65% (61%).  No evidence of ischemia or infarction.  Small apical defect likely related to apical thinning.   PARTIAL KNEE ARTHROPLASTY  06/2003   right   PERCUTANEOUS CORONARY STENT INTERVENTION (PCI-S)  08/07/2011   Procedure: PERCUTANEOUS CORONARY STENT INTERVENTION (PCI-S);  Surgeon: Alm LELON Clay, MD;  Location: [facility] CATH LAB;  Service: CV;;  Abnormal TM Myoview  - for exertional throat discomfort: Patent RCA stent, 90% circumflex -- Vision BMS 3.5 mm x 12 mm --> 4.2 mm. Ostial D2 95% -- 2.25 mm Cutting Balloon PTCA   POLYPECTOMY  05/28/2019   Procedure: POLYPECTOMY;   Surgeon: Golda Claudis PENNER, MD;  Location: [facility];  Service: Endoscopy;;  colon   POLYPECTOMY  03/15/2022   Procedure: POLYPECTOMY INTESTINAL;  Surgeon: Eartha Flavors Toribio, MD;  Location: [facility];  Service: Gastroenterology;;   TONSILLECTOMY     as a child   TOTAL KNEE ARTHROPLASTY  11/2003   right x2 and one on left- Total of 3.   TOTAL KNEE ARTHROPLASTY Left 09/28/2012   Procedure: TOTAL KNEE ARTHROPLASTY;  Surgeon: Dempsey LULLA Moan, MD;  Location: [facility];  Service: Orthopedics;  Laterality: Left;   TRANSTHORACIC ECHOCARDIOGRAM  05/14/2021   EF 55 to 60%.  No R WMA.  GR 1 DD.   Mild LA dilation. Normal PAP and RAP.  Essentially normal.     reports that he quit smoking about 43 years ago. His smoking use included cigarettes. He started smoking about 71 years ago. He has a 56 pack-year smoking history. He has never used smokeless tobacco. He reports current alcohol use of about 2.0 standard drinks of alcohol per week. He reports that he does not use drugs.  Family History  Problem Relation Age of Onset   Emphysema Father    Lung cancer Paternal Uncle        x 2 smokers   Colon cancer Neg Hx      Physical Exam: Vitals:   01/18/24 0335 01/18/24 0345 01/18/24 0350 01/18/24 0400  BP: (!) 151/77   (!) 149/77  Pulse:  91 90 87  Resp: (!) 25 (!) 22 (!) 23 (!) 27  Temp:      TempSrc:  SpO2:  93% 96% 93%  Weight:      Height:        Gen: Awake, alert, chronically ill appearing   HEENT: Head appears atraumatic. Mucositis.  Neck: C spine nontender, neck supple  CV: Regular, normal S1, S2, 3/6 SEM  Resp: Normal WOB, on , CTAB  Abd: He had AUR at time of my exam, initially distended lower abd -> after I/O cath Flat, hyperactive, nontender MSK: There is swelling over the left wrist and dorsum of the hand, there is a central skin tear on the dorsum of the hand with dark/ purple appearance of skin at the injury, There is slight erythema but more so appears to have  venous congestion, tenderness is greatest at the wrist joint and wrist is painful with ROM. LE Symmetric, there is 1+ edema  Skin: See HEENT, MSK for more findings. Otherwise on the L flank there is a round ~ 10 cm ovoid area with central ecchymosis, and more peripheral erythema. Cherry angiomas over the abd.  Neuro: Alert and interactive, after decompression of bladder, more somnolent. Awakens to mild nonpainful stim, moving all extremities.  Psych: euthymic, appropriate    Data review:   Labs reviewed, notable for:   Lactate 1.4  T bili 1.4, other LFT wnl  WBC 7   Micro:  Results for orders placed or performed during the hospital encounter of 01/18/24  Resp panel by RT-PCR (RSV, Flu A&B, Covid) Anterior Nasal Swab     Status: None   Collection Time: 01/18/24  2:11 AM   Specimen: Anterior Nasal Swab  Result Value Ref Range Status   SARS Coronavirus 2 by RT PCR NEGATIVE NEGATIVE Final    Comment: (NOTE) SARS-CoV-2 target nucleic acids are NOT DETECTED.  The SARS-CoV-2 RNA is generally detectable in upper respiratory specimens during the acute phase of infection. The lowest concentration of SARS-CoV-2 viral copies this assay can detect is 138 copies/mL. A negative result does not preclude SARS-Cov-2 infection and should not be used as the sole basis for treatment or other patient management decisions. A negative result may occur with  improper specimen collection/handling, submission of specimen other than nasopharyngeal swab, presence of viral mutation(s) within the areas targeted by this assay, and inadequate number of viral copies(<138 copies/mL). A negative result must be combined with clinical observations, patient history, and epidemiological information. The expected result is Negative.  Fact Sheet for Patients:  BloggerCourse.com  Fact Sheet for Healthcare Providers:  SeriousBroker.it  This test is no t yet approved or  cleared by the [place]  FDA and  has been authorized for detection and/or diagnosis of SARS-CoV-2 by FDA under an Emergency Use Authorization (EUA). This EUA will remain  in effect (meaning this test can be used) for the duration of the COVID-19 declaration under Section 564(b)(1) of the Act, 21 U.S.C.section 360bbb-3(b)(1), unless the authorization is terminated  or revoked sooner.       Influenza A by PCR NEGATIVE NEGATIVE Final   Influenza B by PCR NEGATIVE NEGATIVE Final    Comment: (NOTE) The Xpert Xpress SARS-CoV-2/FLU/RSV plus assay is intended as an aid in the diagnosis of influenza from Nasopharyngeal swab specimens and should not be used as a sole basis for treatment. Nasal washings and aspirates are unacceptable for Xpert Xpress SARS-CoV-2/FLU/RSV testing.  Fact Sheet for Patients: BloggerCourse.com  Fact Sheet for Healthcare Providers: SeriousBroker.it  This test is not yet approved or cleared by the [place]  FDA and has been authorized for detection  and/or diagnosis of SARS-CoV-2 by FDA under an Emergency Use Authorization (EUA). This EUA will remain in effect (meaning this test can be used) for the duration of the COVID-19 declaration under Section 564(b)(1) of the Act, 21 U.S.C. section 360bbb-3(b)(1), unless the authorization is terminated or revoked.     Resp Syncytial Virus by PCR NEGATIVE NEGATIVE Final    Comment: (NOTE) Fact Sheet for Patients: BloggerCourse.com  Fact Sheet for Healthcare Providers: SeriousBroker.it  This test is not yet approved or cleared by the [place]  FDA and has been authorized for detection and/or diagnosis of SARS-CoV-2 by FDA under an Emergency Use Authorization (EUA). This EUA will remain in effect (meaning this test can be used) for the duration of the COVID-19 declaration under Section 564(b)(1) of the Act, 21  U.S.C. section 360bbb-3(b)(1), unless the authorization is terminated or revoked.  Performed at [facility], [address] [place], [place] [address]     Imaging reviewed:  CT CHEST ABDOMEN PELVIS W CONTRAST Result Date: 01/18/2024 CLINICAL DATA:  Decreased intake, possible sepsis EXAM: CT CHEST, ABDOMEN, AND PELVIS WITH CONTRAST TECHNIQUE: Multidetector CT imaging of the chest, abdomen and pelvis was performed following the standard protocol during bolus administration of intravenous contrast. RADIATION DOSE REDUCTION: This exam was performed according to the departmental dose-optimization program which includes automated exposure control, adjustment of the mA and/or kV according to patient size and/or use of iterative reconstruction technique. CONTRAST:  OMNIPAQUE  IOHEXOL  300 MG/ML  SOLN COMPARISON:  Chest x-ray from earlier in the same day, 05/14/2021. FINDINGS: CT CHEST FINDINGS Cardiovascular: Atherosclerotic calcifications of the thoracic aorta are noted. No aneurysmal dilatation or dissection is seen. Coronary calcifications are noted. Pulmonary artery shows no large central pulmonary embolus. Timing was not performed for embolus evaluation. Left chest wall port is noted. Mediastinum/Nodes: Thoracic inlet is within normal limits. No hilar or mediastinal adenopathy is noted. The esophagus as visualized is within normal limits. Lungs/Pleura: Mild atelectatic changes are noted in the bases bilaterally. Mild emphysematous changes are seen in the apices. No effusion is noted. Musculoskeletal: Mild degenerative changes of the thoracic spine are seen. No acute rib abnormality is noted. CT ABDOMEN PELVIS FINDINGS Hepatobiliary: Scattered cysts are noted within the liver. The gallbladder is well distended without focal abnormality. Pancreas: Unremarkable. No pancreatic ductal dilatation or surrounding inflammatory changes. Spleen: Normal in size without focal abnormality. Adrenals/Urinary Tract:  Adrenal glands are within normal limits. Kidneys show normal enhancement pattern. Simple cysts are noted on the right. No complicating factors are seen. No follow-up is recommended. Bladder is within normal limits. Stomach/Bowel: Scattered fecal material is noted throughout the colon. No obstructive or inflammatory changes are seen. Mild diverticular change is noted without evidence of diverticulitis. The appendix demonstrates multiple appendicoliths although no inflammatory changes are seen. Small bowel and stomach are within normal limits. Vascular/Lymphatic: Aortic atherosclerosis. No enlarged abdominal or pelvic lymph nodes. Reproductive: Prostate is unremarkable. Other: No abdominal wall hernia or abnormality. No abdominopelvic ascites. Musculoskeletal: Degenerative changes of lumbar spine are noted. IMPRESSION: CT of the chest: Mild basilar atelectasis. CT of the abdomen and pelvis: Diverticulosis without diverticulitis. No acute abnormality noted. Electronically Signed   By: Oneil Devonshire M.D.   On: 01/18/2024 01:58   DG Chest Portable 1 View Result Date: 01/18/2024 CLINICAL DATA:  Sepsis EXAM: PORTABLE CHEST 1 VIEW COMPARISON:  12/16/2023 FINDINGS: Left chest wall port is again seen and stable. Cardiac shadow is enlarged. Lungs are hypoinflated with basilar atelectasis left greater than right. No  bony abnormality is noted. IMPRESSION: Hypoinflation with small basilar atelectatic changes. Electronically Signed   By: Oneil Devonshire M.D.   On: 01/18/2024 01:02   DG Wrist Complete Left Result Date: 01/17/2024 CLINICAL DATA:  Two day history of hand swelling EXAM: LEFT WRIST - COMPLETE 4 VIEW; LEFT HAND - COMPLETE 3 VIEW COMPARISON:  Left hand radiograph dated 08/05/2021 FINDINGS: There is no evidence of fracture or dislocation. Diffuse degenerative changes of the hand and wrist. Unchanged punctate metallic radiodensity projecting over the palmar aspect of the thumb metacarpal phalangeal joint. Diffuse soft  tissue swelling of the hand. IMPRESSION: 1. Diffuse soft tissue swelling of the hand. No acute fracture or dislocation. 2. Unchanged punctate metallic foreign body at the base of the thumb. Electronically Signed   By: Limin  Xu M.D.   On: 01/17/2024 13:22   DG Hand Complete Left Result Date: 01/17/2024 CLINICAL DATA:  Two day history of hand swelling EXAM: LEFT WRIST - COMPLETE 4 VIEW; LEFT HAND - COMPLETE 3 VIEW COMPARISON:  Left hand radiograph dated 08/05/2021 FINDINGS: There is no evidence of fracture or dislocation. Diffuse degenerative changes of the hand and wrist. Unchanged punctate metallic radiodensity projecting over the palmar aspect of the thumb metacarpal phalangeal joint. Diffuse soft tissue swelling of the hand. IMPRESSION: 1. Diffuse soft tissue swelling of the hand. No acute fracture or dislocation. 2. Unchanged punctate metallic foreign body at the base of the thumb. Electronically Signed   By: Limin  Xu M.D.   On: 01/17/2024 13:22    EKG:  Personally reviewed SR RBBB without acute ischemic changes.   ED Course:  Treated with Vanc, Cefepime , Flagyl , 1 L IVF.     Assessment/Plan:  83 y.o. male with hx metastatic melanoma with mets to brain s/p SBRT, immunotherapy with near-CR, IRAE with psoriatic flare and mucositis, adrenal insufficiency on chronic glucocorticoid, CAD with PCI, HFpEF, HTN, DM type 2, HLD, remote hx bladder CA, venous insufficiency, who presents with AMS, weakness, and GLF. Admitted with suspected sepsis with unknown source.   Sepsis, unknown source, present on admission, improving  Cellulitis L hand, ? Septic arthritis L wrist.  Dehydration Hx 2-3 days pain and swelling in the L hand, abrasion over the dorsum of hand. Only other localizing symptoms include mucositis and rash on the R flank. Reportedly chronic hypoxia with no recent respiratory symptoms. On initial eval febrile to 39.6C, tachycardic into 110s, RR in mid 20s, desat to 85% on RA requiring 4L O2.  Exam not overly impressive with cellulitis in the L hand, moreso concerned about the L wrist which is equisitely tender and painful Rom, will attach pic of hands and of L flank to media. May even be inflammatory arthropathy or traumatic injury in the hand / wrist / flank in although XR of hand / wrist yesterday negative for acute process. UA rare bacteria, otherwise not consistent with infection. CT C/A/P unrevealing for source with only mild atelectasis at bases and no other acute findings. Source questionable, possibly skin / soft tissue from cellulitis, or mucositis, possible septic arthritis in the wrist. Less likely pulmonary considering fam report of chronic hypoxia.  - Continue vancomycin  pharmacy to dose, cefepime  2 g IV every 8 hour.  Okay to stop Flagyl  less likely anaerobic source.  - S/p 1 L IV fluid, lactate within normal limit.  Continue LR at 100 cc an hour for now - Follow-up blood cultures, check RVP, sputum culture.  Check ESR, CRP, uric acid - Depending on clinical improvement /  inflammatory markers may consider MRI of the left wrist  Encephalopathy, metabolic Presumed in setting of underlying infection and dehydration.  Does have history of recent fall, metastatic melanoma with brain mets s/p SBRT. On my evaluation he is oriented to person, [place], not to time or situation. Moving all extremities. Somnolent (although received haldol shortly before for agitation related to AUR). Labs unrevealing for cause. CT head negative for acute findings.. - Management of infection/volume status per above - Check TSH, B12, consider VBG if not improving with above   ? Chronic hypoxic respiratory failure  Family reports chronic sats in upper 80s, against home O2. Emphysema noted on imaging.  -Albuterol  prn, I-S, flutter valve -Evaluating for potential infection per above although feel less likely -Home O2 eval before discharge.   Acute urinary retention Significant urinary retention  resulting in agitation, hypertension in the ED.  Had 1.3 L out on straight cath. Then second episode of retention with 1L out.  - If additional episode of retention will place Foley catheter, discussed with family - Serial bladder scan   Ground level fall, deconditioning  -PT evaluation  Chronic medical problems: metastatic melanoma with mets to brain s/p SBRT, immunotherapy with near-CR: Follows with [facility], off immunotherapy at this time.  IRAE with psoriatic flare and mucositis: Noted, on oxycodone  for pain control   Adrenal insufficiency: Continue prednisone  10 mg daily CAD with PCI: Not on aspirin . Continue home atorvastatin .  HFpEF: Without acute exacerbation, TTE '22 LVEF 55-60%, grade 1 diastolic dysfunction.  Aortic sclerosis with murmur: noted on exam, chronic.  HTN: Continue home Amlodipine , and ISMN. Held hydralazine  and Lisinopril  for now.  DM type 2: Diet controlled. SSI for very sensitive.  HLD: Statin per above remote hx bladder CA: Noted.  Venous insufficiency: Noted   Body mass index is 29.84 kg/m.    DVT prophylaxis:  SCDs Code Status:  Full Code Diet:  Diet Orders (From admission, onward)     Start     Ordered   01/18/24 0252  Diet regular Room service appropriate? Yes; Fluid consistency: Thin  Diet effective now       Question Answer Comment  Room service appropriate? Yes   Fluid consistency: Thin      01/18/24 0253           Family Communication:  Yes discussed with wife and daughter at bedside   Consults:  None   Admission status:   Inpatient, Telemetry bed  Severity of Illness: The appropriate patient status for this patient is INPATIENT. Inpatient status is judged to be reasonable and necessary in order to provide the required intensity of service to ensure the patient's safety. The patient's presenting symptoms, physical exam findings, and initial radiographic and laboratory data in the context of their chronic comorbidities is felt to  place them at high risk for further clinical deterioration. Furthermore, it is not anticipated that the patient will be medically stable for discharge from the hospital within 2 midnights of admission.   * I certify that at the point of admission it is my clinical judgment that the patient will require inpatient hospital care spanning beyond 2 midnights from the point of admission due to high intensity of service, high risk for further deterioration and high frequency of surveillance required.*   Dorn Dawson, MD [facility]  How to contact the [facility] Attending or Consulting provider 7A - 7P or covering provider during after hours 7P -7A, for this patient.  Check the care team  in [facility] and look for a) attending/consulting [facility] provider listed and b) the [facility] team listed Log into [email or web address] and use [facility] universal password to access. If you do not have the password, please contact the hospital operator. Locate the [facility] provider you are looking for under [facility] and page to a number that you can be directly reached. If you still have difficulty reaching the provider, please page the [facility] (Director on Call) for the Hospitalists listed on amion for assistance.  01/18/2024, 4:16 AM

## 2024-01-18 NOTE — Progress Notes (Signed)
 Pharmacy Antibiotic Note  Arthur Lutz is a 83 y.o. male admitted on 01/18/2024 with Altered mental status/generalized weakness.  Pharmacy has been consulted for vancomycin  dosing for sepsis.  -Cefepime /flagyl  in ED -Tmax 103.2, sCr at baseline -Blood cultures collected  Plan: -Cefepime  2g IV every 8 hours -Flagyl  500mg  IV every 12 hours -Vancomycin  2g IV x1 -Vancomycin  1250mg  IV every 12 hours (AUC 486, Vd 0.72, IBW, sCr 0.8) -Monitor renal function -Follow up signs of clinical improvement, LOT, de-escalation of antibiotics   Height: 6' (182.9 cm) Weight: 99.8 kg (220 lb) IBW/kg (Calculated) : 77.6  Temp (24hrs), Avg:100.8 F (38.2 C), Min:97.9 F (36.6 C), Max:103.2 F (39.6 C)  Recent Labs  Lab 01/18/24 0041  WBC 7.7  CREATININE 0.77  LATICACIDVEN 1.4    Estimated Creatinine Clearance: 90.1 mL/min (by C-G formula based on SCr of 0.77 mg/dL).    Allergies  Allergen Reactions   Apremilast Other (See Comments)    unknown   Aspirin  Other (See Comments)    Was told to not take this due to having macular degeneration   Leflunomide Other (See Comments)    unknown   Methotrexate Other (See Comments)    unknown   Rosuvastatin Calcium  Other (See Comments)    Aches and cramps   Other Rash and Other (See Comments)    Red meat = develops a rash on the back    Antimicrobials this admission: Cefepime  6/22 >>  Flagyl  6/22 >>  Vancomycin  6/22 >>  Microbiology results: 6/22 BCx:   Thank you for allowing pharmacy to be a part of this patient's care.  Lynwood Poplar, PharmD, BCPS Clinical Pharmacist 01/18/2024 3:08 AM

## 2024-01-18 NOTE — ED Notes (Addendum)
 Pt is very restless at bedside. Pt standing at beside with daughter c/o of not being able to get his pee out. This RN assisted pt back in bed to give medication and assessed pt's abdomen. Abdomen distended and could palpate pt's bladder. Will bladder scan

## 2024-01-18 NOTE — Plan of Care (Signed)

## 2024-01-18 NOTE — ED Notes (Signed)
 Pt came back from CT and had to urinate. Pt given urinal and repositioned. Pt unable to urinate and requested to be in and out again. Bladder scanned pt and then in and out. Drained from bladder Provider made aware

## 2024-01-18 NOTE — Progress Notes (Addendum)
 PROGRESS NOTE    Arthur Lutz  FMW:[id number] DOB: [birth year] DOA: 01/18/2024 PCP: Bertell Satterfield, MD   Brief Narrative: 83 year old with PMH significant for metastatic Melanoma mets to brain S/P SBRT, Immunotherapy with near CR, IRAE with psoriatic flare and mucositis, adrenal insuficiency on chronic glucocorticoid, CAD with PCI, HFpEF, HTN, DM type 2, HLD, remote history of bladder cancer, presents with AMS, weakness.  Patient has also developed left hand wrist swelling redness and pain. He presented with acute metabolic encephalopathy, left hand with concern of infection.  He was also noted to have oxygen saturation in the 80.   Assessment & Plan:   Active Problems:   Acute metabolic encephalopathy   Sepsis (HCC)   Dehydration   Acute urinary retention   Chronic hypoxic respiratory failure (HCC)   Ground-level fall   1-Sepsis secondary to left hand cellulitis infection Patient presented with left hand swelling, he has an abrasion at the dorsum of the hand, fever temperature 103, tachycardic heart rate 110s, tachypnea respiration rate 20.  Source of infection left hand. - Continue with IV fluids - Continue with IV cefepime , vancomycin .  Will add Flagyl . - X-ray:Diffuse soft tissue swelling of the hand. No acute fracture or dislocation. Unchanged punctate metallic foreign body at the base of the thumb. - CT chest abdomen and pelvis: Chest: Mild basilar atelectasis, diverticulosis without diverticulitis.  2-Left hand swelling and redness: Concern with cellulitis and infection - Continue IV vancomycin  and cefepime  will add Flagyl  - Will proceed with checking CT left hand with  contrast.  Will proceed with MRI because he has a foreign metal on his left thumb - Depending on CT results may need to consult hand surgeon - Uric acid 2.5, ESR 24  3-Acute metabolic encephalopathy: - Patient presented with lethargy, worsening confusion, weakness. - In the setting of sepsis and infection  and dehydration. - He has been more somnolent also after he received Haldol - TSH: 0.9 B12: 1197 - Keep him on IV fluid  4-questionable Acute on chronic hypoxic respiratory failure Emphysema noted on x-ray Family reports chronic oxygen saturations in the upper 80s CT chest basilar atelectasis.  - Oxygen sats 95% on 4 L - He will need oxygen on ambulation and mild need to be discharged home with oxygen supplementation  5-Acute urinary retention: Will start Flomax - Foley catheter placed , had 1.3 L out with a straight cath, the second episode of retention with 1 L. -He will need to be discharged with Foley catheter, follow-up with urology as an outpatient.  Will start Flomax  Ground-level fall: Deconditioning Will need PT CT head no acute finding  Hypokalemia; replete orally.   Metastatic melanoma with mets to brain s/p SBRT, immunotherapy with near-CR: Follows with [facility], off immunotherapy at this time.  -per wife last MRI brain was negative for mets.   IRAE with psoriatic flare and mucositis: Noted, on oxycodone  for pain control   Adrenal insufficiency: on  prednisone  10 mg daily. He wont be able to take prednisone  due to AMS. Will order Hydrocortisone 50 mg IV BID.   CAD with PCI: Not on aspirin . Continue home atorvastatin .  HFpEF: Without acute exacerbation, TTE '22 LVEF 55-60%, grade 1 diastolic dysfunction.  Aortic sclerosis with murmur: chronic.  HTN: Continue home Amlodipine , and ISMN. Held hydralazine  and Lisinopril  for now.  DM type 2: Diet controlled. SSI for very sensitive.  HLD: Statin remote hx bladder CA: Noted.  Venous insufficiency: Noted    Estimated body  mass index is 29.84 kg/m as calculated from the following:   Height as of this encounter: 6' (1.829 m).   Weight as of this encounter: 99.8 kg.   DVT prophylaxis: SCD Code Status: Full code Family Communication: Multiple family member at bedside Disposition Plan:  Status is: Inpatient Remains  inpatient appropriate because: management of left hand infection    Consultants:  none  Procedures:  none  Antimicrobials:  Vancomycin  , cefepime , flagyl   Subjective: He is sleepy, would say few word. =keeps eyes close.   Objective: Vitals:   01/18/24 0453 01/18/24 0455 01/18/24 0500 01/18/24 0531  BP:   (!) 131/108 (!) 171/88  Pulse:  85 85 80  Resp:  (!) 22 (!) 25 20  Temp: 98.8 F (37.1 C)   99.1 F (37.3 C)  TempSrc: Oral   Oral  SpO2:  96% 95% 95%  Weight:      Height:        Intake/Output Summary (Last 24 hours) at 01/18/2024 0733 Last data filed at 01/18/2024 0653 Gross per 24 hour  Intake 1320.61 ml  Output 2326 ml  Net -1005.39 ml   Filed Weights   01/18/24 0027  Weight: 99.8 kg    Examination:  General exam: Appears calm and comfortable  Respiratory system: Clear to auscultation. Respiratory effort normal. Cardiovascular system: S1 & S2 heard, RRR. No JVD, murmurs, rubs, gallops or clicks. No pedal edema. Gastrointestinal system: Abdomen is nondistended, soft and nontender. No organomegaly or masses felt. Normal bowel sounds heard. Central nervous system: sleepy.  Extremities: no edema left hand with redness swelling   Data Reviewed: I have personally reviewed following labs and imaging studies  CBC: Recent Labs  Lab 01/18/24 0041  WBC 7.7  NEUTROABS 5.7  HGB 12.2*  HCT 37.6*  MCV 91.5  PLT 249   Basic Metabolic Panel: Recent Labs  Lab 01/18/24 0041 01/18/24 0549  NA 136 137  K 3.7 3.3*  CL 98 103  CO2 28 25  GLUCOSE 183* 132*  BUN 12 11  CREATININE 0.77 0.67  CALCIUM  8.9 8.6*  MG  --  2.0  PHOS  --  3.7   GFR: Estimated Creatinine Clearance: 90.1 mL/min (by C-G formula based on SCr of 0.67 mg/dL). Liver Function Tests: Recent Labs  Lab 01/18/24 0041  AST 14*  ALT 15  ALKPHOS 70  BILITOT 1.4*  PROT 6.8  ALBUMIN  3.5   No results for input(s): LIPASE, AMYLASE in the last 168 hours. No results for input(s):  AMMONIA in the last 168 hours. Coagulation Profile: Recent Labs  Lab 01/18/24 0041  INR 1.1   Cardiac Enzymes: No results for input(s): CKTOTAL, CKMB, CKMBINDEX, TROPONINI in the last 168 hours. BNP (last 3 results) No results for input(s): PROBNP in the last 8760 hours. HbA1C: No results for input(s): HGBA1C in the last 72 hours. CBG: Recent Labs  Lab 01/18/24 0028  GLUCAP 176*   Lipid Profile: No results for input(s): CHOL, HDL, LDLCALC, TRIG, CHOLHDL, LDLDIRECT in the last 72 hours. Thyroid  Function Tests: Recent Labs    01/18/24 0549  TSH 0.948   Anemia Panel: Recent Labs    01/18/24 0549  VITAMINB12 1,197*   Sepsis Labs: Recent Labs  Lab 01/18/24 0041  LATICACIDVEN 1.4    Recent Results (from the past 240 hours)  Resp panel by RT-PCR (RSV, Flu A&B, Covid) Anterior Nasal Swab     Status: None   Collection Time: 01/18/24  2:11 AM   Specimen: Anterior  Nasal Swab  Result Value Ref Range Status   SARS Coronavirus 2 by RT PCR NEGATIVE NEGATIVE Final    Comment: (NOTE) SARS-CoV-2 target nucleic acids are NOT DETECTED.  The SARS-CoV-2 RNA is generally detectable in upper respiratory specimens during the acute phase of infection. The lowest concentration of SARS-CoV-2 viral copies this assay can detect is 138 copies/mL. A negative result does not preclude SARS-Cov-2 infection and should not be used as the sole basis for treatment or other patient management decisions. A negative result may occur with  improper specimen collection/handling, submission of specimen other than nasopharyngeal swab, presence of viral mutation(s) within the areas targeted by this assay, and inadequate number of viral copies(<138 copies/mL). A negative result must be combined with clinical observations, patient history, and epidemiological information. The expected result is Negative.  Fact Sheet for Patients:   BloggerCourse.com  Fact Sheet for Healthcare Providers:  SeriousBroker.it  This test is no t yet approved or cleared by the [place]  FDA and  has been authorized for detection and/or diagnosis of SARS-CoV-2 by FDA under an Emergency Use Authorization (EUA). This EUA will remain  in effect (meaning this test can be used) for the duration of the COVID-19 declaration under Section 564(b)(1) of the Act, 21 U.S.C.section 360bbb-3(b)(1), unless the authorization is terminated  or revoked sooner.       Influenza A by PCR NEGATIVE NEGATIVE Final   Influenza B by PCR NEGATIVE NEGATIVE Final    Comment: (NOTE) The Xpert Xpress SARS-CoV-2/FLU/RSV plus assay is intended as an aid in the diagnosis of influenza from Nasopharyngeal swab specimens and should not be used as a sole basis for treatment. Nasal washings and aspirates are unacceptable for Xpert Xpress SARS-CoV-2/FLU/RSV testing.  Fact Sheet for Patients: BloggerCourse.com  Fact Sheet for Healthcare Providers: SeriousBroker.it  This test is not yet approved or cleared by the [place]  FDA and has been authorized for detection and/or diagnosis of SARS-CoV-2 by FDA under an Emergency Use Authorization (EUA). This EUA will remain in effect (meaning this test can be used) for the duration of the COVID-19 declaration under Section 564(b)(1) of the Act, 21 U.S.C. section 360bbb-3(b)(1), unless the authorization is terminated or revoked.     Resp Syncytial Virus by PCR NEGATIVE NEGATIVE Final    Comment: (NOTE) Fact Sheet for Patients: BloggerCourse.com  Fact Sheet for Healthcare Providers: SeriousBroker.it  This test is not yet approved or cleared by the [place]  FDA and has been authorized for detection and/or diagnosis of SARS-CoV-2 by FDA under an Emergency Use  Authorization (EUA). This EUA will remain in effect (meaning this test can be used) for the duration of the COVID-19 declaration under Section 564(b)(1) of the Act, 21 U.S.C. section 360bbb-3(b)(1), unless the authorization is terminated or revoked.  Performed at [facility], [address], [place], [place] [address]          Radiology Studies: CT HEAD WO CONTRAST ( ) Result Date: 01/18/2024 EXAM: CT HEAD WITHOUT CONTRAST 01/18/2024 04:35:20 AM TECHNIQUE: CT of the head was performed without the administration of intravenous contrast. Automated exposure control, iterative reconstruction, and/or weight based adjustment of the mA/kV was utilized to reduce the radiation dose to as low as reasonably achievable. COMPARISON: CT head without contrast 12/16/2023. CLINICAL HISTORY: AMS, fall, hx melanoma met to brain. Eval for ICH. Pt bib EMS after family called reporting decreased oral intake x 4 days. Family believes pt is dehydrated. FINDINGS: BRAIN AND VENTRICLES: No acute hemorrhage. Gray-white differentiation is preserved.  No hydrocephalus. No extra-axial collection. No mass effect or midline shift. Mild generalized atrophy and moderate diffuse white matter disease are present bilaterally. ORBITS: Bilateral lens replacements are noted. The globes and orbits are otherwise within normal limits. SINUSES: No acute abnormality. SOFT TISSUES AND SKULL: No acute soft tissue abnormality. No skull fracture. Intervertebral atherosclerotic calcifications are present in the basilar artery. IMPRESSION: 1. No acute intracranial abnormality. 2. Mild generalized atrophy and moderate diffuse white matter disease bilaterally is stable . Electronically signed by: Lonni Necessary MD 01/18/2024 05:06 AM EDT RP Workstation: [id number]   CT CHEST ABDOMEN PELVIS W CONTRAST Result Date: 01/18/2024 CLINICAL DATA:  Decreased intake, possible sepsis EXAM: CT CHEST, ABDOMEN, AND PELVIS WITH CONTRAST TECHNIQUE:  Multidetector CT imaging of the chest, abdomen and pelvis was performed following the standard protocol during bolus administration of intravenous contrast. RADIATION DOSE REDUCTION: This exam was performed according to the departmental dose-optimization program which includes automated exposure control, adjustment of the mA and/or kV according to patient size and/or use of iterative reconstruction technique. CONTRAST:  OMNIPAQUE  IOHEXOL  300 MG/ML  SOLN COMPARISON:  Chest x-ray from earlier in the same day, 05/14/2021. FINDINGS: CT CHEST FINDINGS Cardiovascular: Atherosclerotic calcifications of the thoracic aorta are noted. No aneurysmal dilatation or dissection is seen. Coronary calcifications are noted. Pulmonary artery shows no large central pulmonary embolus. Timing was not performed for embolus evaluation. Left chest wall port is noted. Mediastinum/Nodes: Thoracic inlet is within normal limits. No hilar or mediastinal adenopathy is noted. The esophagus as visualized is within normal limits. Lungs/Pleura: Mild atelectatic changes are noted in the bases bilaterally. Mild emphysematous changes are seen in the apices. No effusion is noted. Musculoskeletal: Mild degenerative changes of the thoracic spine are seen. No acute rib abnormality is noted. CT ABDOMEN PELVIS FINDINGS Hepatobiliary: Scattered cysts are noted within the liver. The gallbladder is well distended without focal abnormality. Pancreas: Unremarkable. No pancreatic ductal dilatation or surrounding inflammatory changes. Spleen: Normal in size without focal abnormality. Adrenals/Urinary Tract: Adrenal glands are within normal limits. Kidneys show normal enhancement pattern. Simple cysts are noted on the right. No complicating factors are seen. No follow-up is recommended. Bladder is within normal limits. Stomach/Bowel: Scattered fecal material is noted throughout the colon. No obstructive or inflammatory changes are seen. Mild diverticular change  is noted without evidence of diverticulitis. The appendix demonstrates multiple appendicoliths although no inflammatory changes are seen. Small bowel and stomach are within normal limits. Vascular/Lymphatic: Aortic atherosclerosis. No enlarged abdominal or pelvic lymph nodes. Reproductive: Prostate is unremarkable. Other: No abdominal wall hernia or abnormality. No abdominopelvic ascites. Musculoskeletal: Degenerative changes of lumbar spine are noted. IMPRESSION: CT of the chest: Mild basilar atelectasis. CT of the abdomen and pelvis: Diverticulosis without diverticulitis. No acute abnormality noted. Electronically Signed   By: Oneil Devonshire M.D.   On: 01/18/2024 01:58   DG Chest Portable 1 View Result Date: 01/18/2024 CLINICAL DATA:  Sepsis EXAM: PORTABLE CHEST 1 VIEW COMPARISON:  12/16/2023 FINDINGS: Left chest wall port is again seen and stable. Cardiac shadow is enlarged. Lungs are hypoinflated with basilar atelectasis left greater than right. No bony abnormality is noted. IMPRESSION: Hypoinflation with small basilar atelectatic changes. Electronically Signed   By: Oneil Devonshire M.D.   On: 01/18/2024 01:02   DG Wrist Complete Left Result Date: 01/17/2024 CLINICAL DATA:  Two day history of hand swelling EXAM: LEFT WRIST - COMPLETE 4 VIEW; LEFT HAND - COMPLETE 3 VIEW COMPARISON:  Left hand radiograph dated 08/05/2021 FINDINGS: There is  no evidence of fracture or dislocation. Diffuse degenerative changes of the hand and wrist. Unchanged punctate metallic radiodensity projecting over the palmar aspect of the thumb metacarpal phalangeal joint. Diffuse soft tissue swelling of the hand. IMPRESSION: 1. Diffuse soft tissue swelling of the hand. No acute fracture or dislocation. 2. Unchanged punctate metallic foreign body at the base of the thumb. Electronically Signed   By: Limin  Xu M.D.   On: 01/17/2024 13:22   DG Hand Complete Left Result Date: 01/17/2024 CLINICAL DATA:  Two day history of hand swelling EXAM:  LEFT WRIST - COMPLETE 4 VIEW; LEFT HAND - COMPLETE 3 VIEW COMPARISON:  Left hand radiograph dated 08/05/2021 FINDINGS: There is no evidence of fracture or dislocation. Diffuse degenerative changes of the hand and wrist. Unchanged punctate metallic radiodensity projecting over the palmar aspect of the thumb metacarpal phalangeal joint. Diffuse soft tissue swelling of the hand. IMPRESSION: 1. Diffuse soft tissue swelling of the hand. No acute fracture or dislocation. 2. Unchanged punctate metallic foreign body at the base of the thumb. Electronically Signed   By: Limin  Xu M.D.   On: 01/17/2024 13:22        Scheduled Meds:  amLODipine   10 mg Oral Daily   atorvastatin   40 mg Oral QPM   Chlorhexidine  Gluconate Cloth  6 each Topical Daily   insulin  aspart  0-6 Units Subcutaneous TID WC   isosorbide  mononitrate  60 mg Oral Daily   melatonin  6 mg Oral QHS   predniSONE   10 mg Oral Q breakfast   sodium chloride  flush  3 mL Intravenous Q12H   Continuous Infusions:  ceFEPime  (MAXIPIME ) IV     lactated ringers  100 mL/hr at 01/18/24 0628   vancomycin        LOS: 0 days    Time spent: 35 Minutes    Chaylee Ehrsam A Janeka Libman, MD [facility]   If 7PM-7AM, please contact night-coverage [email or web address]  01/18/2024, 7:33 AM

## 2024-01-18 NOTE — Progress Notes (Signed)
 Patient unable to urinate, bladder scan for 710cc, MD notified and he ordered Foley to be placed. This RN went to place the Foley but patient wanted to go sit on the toilet. This RN assisted patient to the toilet and informed day shift nurse to insert the Foley when patient is done with toileting.

## 2024-01-18 NOTE — ED Provider Notes (Signed)
 Arthur Lutz EMERGENCY DEPARTMENT AT [facility]  Provider Note  CSN: [id number] Arrival date & time: 01/18/24 0021  History Chief Complaint  Patient presents with  . Possible Dehydration  . Altered Mental Status    Arthur Lutz is a 83 y.o. male with history of melanoma treated with immunotherapy at [facility], currently on hold, has been on long term chronic steroids for mucositis and joint pain which had been tapered off in May. He was seen in the ED on 5/20 with general weakness and steroids were subsequently re-started. He saw Endo on 6/12 for evaluation of steroid dependence/adrenal insufficiency. They are working on a long term taper and transition to hydrocortisone. He also saw UC earlier in the day for L hand pain/swelling where he had sustained a superficial abrasion a few days before. Xrays were neg. He was given Rx for augmentin  but pharmacy was closed and he wasn't able to start it. Tonight he became confused and generally weak. He has not had good PO intake recently. Family called EMS for transport to the ED. They report normal vitals with them. On arrival here he was noted to be febrile. Not tachycardic or hypotensive. Low SpO2 which is apparently chronic for him, but refuses home oxygen per daughter. He is unable to provide much additional history but was complaining of abdominal pain as well.    Home Medications Prior to Admission medications   Medication Sig Start Date End Date Taking? Authorizing Provider  acetaminophen  (TYLENOL ) 650 MG CR tablet Take 1,300 mg by mouth every 8 (eight) hours as needed for pain.    [provider]  amLODipine  (NORVASC ) 10 MG tablet TAKE (1) TABLET BY MOUTH ONCE DAILY. 12/29/23   Anner Alm LELON, MD  amoxicillin -clavulanate (AUGMENTIN ) 875-125 MG tablet Take 1 tablet by mouth every 12 (twelve) hours. 01/17/24   Enedelia Dorna HERO, FNP  Apremilast (OTEZLA) 30 MG TABS Take 1 tablet by mouth 2 (two) times daily. Take as directed  05/23/22   [provider]  ascorbic acid  (VITAMIN C) 1000 MG tablet Take 1,000 mg by mouth daily. 10/10/17   [provider]  atorvastatin  (LIPITOR) 40 MG tablet Take 1 tablet (40 mg total) by mouth daily. Patient taking differently: Take 40 mg by mouth every evening. 12/02/23 03/01/24  Anner Alm LELON, MD  azelastine  (ASTELIN ) 0.1 % nasal spray Place 1 spray into the nose daily as needed (allergies).    [provider]  clobetasol (TEMOVATE) 0.05 % external solution Apply 1 application topically daily as needed (irritation). 03/01/19   [provider]  Coenzyme Q10 (COQ10) 100 MG CAPS Take 100 mg by mouth daily.    [provider]  DPH-Lido-AlHydr-MgHydr-Simeth (FIRST-MOUTHWASH BLM) SUSP Take 5 mLs by mouth daily. 11/30/23   [provider]  EPINEPHrine  0.3 mg/0.3 mL IJ SOAJ injection Inject 0.3 mg into the muscle as needed for anaphylaxis. 12/25/18   [provider]  ezetimibe  (ZETIA ) 10 MG tablet Take 10 mg by mouth daily. Patient not taking: Reported on 12/16/2023    [provider]  furosemide  (LASIX ) 20 MG tablet Take 1 tablet (20 mg total) by mouth daily as needed. An may take an additional 20 mg if needed Patient taking differently: Take 20 mg by mouth daily as needed for fluid. 09/03/22   Anner Alm LELON, MD  gabapentin (NEURONTIN) 100 MG capsule Take 100 mg by mouth at bedtime as needed (pain).    [provider]  hydrALAZINE  (APRESOLINE ) 50 MG  tablet TAKE (1) TABLET BY MOUTH (3) TIMES DAILY. Patient taking differently: Take 50 mg by mouth daily. 12/02/22   Anner Alm ORN, MD  isosorbide  mononitrate (IMDUR ) 60 MG 24 hr tablet TAKE (1) TABLET BY MOUTH ONCE DAILY. 12/08/23   Anner Alm ORN, MD  lisinopril  (ZESTRIL ) 40 MG tablet TAKE 1/2 TABLET BY MOUTH ONCE DAILY. 07/10/22   Anner Alm ORN, MD  loperamide  (IMODIUM ) 2 MG capsule Take 1 capsule (2 mg total) by mouth 4 (four) times daily as needed for diarrhea or loose  stools. 03/30/19   Theadore Ozell HERO, MD  LOTEMAX 0.5 % OINT Place 1 application into the left eye daily as needed (irritation, dry eyes). 02/15/21   [provider]  magnesium  gluconate (MAGONATE) 500 MG tablet Take 500 mg by mouth daily.    [provider]  meloxicam (MOBIC) 15 MG tablet Take 15 mg by mouth daily as needed for pain.    [provider]  Naltrexone HCl, Pain, (NALTREX) 4.5 MG CAPS Take 4.5 mg by mouth every evening. Patient not taking: Reported on 12/16/2023    [provider]  naproxen sodium (ALEVE) 220 MG tablet Take 440 mg by mouth 2 (two) times daily as needed (pain).    [provider]  OVER THE COUNTER MEDICATION Take 2 Capfuls by mouth daily. HistaQuel (Histamine)    [provider]  OVER THE COUNTER MEDICATION Take 1 capsule by mouth daily. Blood Sugar Formula    [provider]  oxyCODONE  (OXY IR/ROXICODONE ) 5 MG immediate release tablet Take 5 mg by mouth every 4 (four) hours as needed for moderate pain (pain score 4-6). 12/09/23   [provider]  prednisoLONE acetate (PRED FORTE) 1 % ophthalmic suspension Place 1 drop into both eyes 2 (two) times daily as needed (irritation). 03/08/21   [provider]  predniSONE  (DELTASONE ) 10 MG tablet TAKE AS DIRECTED AS NEEDED FOR ARTHRITIS. TAKE 3 TABLETS FOR 2 DAYS, THEN 2 TABLETS FOR 1 DAY, THEN 1 TABLET FOR 1 DAY. Patient taking differently: Take 10 mg by mouth See admin instructions. TAKE AS DIRECTED AS NEEDED FOR ARTHRITIS. TAKE 3 TABLETS FOR 2 DAYS, THEN 2 TABLETS FOR 1 DAY, THEN 1 TABLET FOR 1 DAY. 01/16/23   Anner Alm ORN, MD  spironolactone  (ALDACTONE ) 25 MG tablet Take 1 tablet (25 mg total) by mouth daily. 09/03/22   Anner Alm ORN, MD  tobramycin (TOBREX) 0.3 % ophthalmic solution Place 1 drop into both eyes daily as needed (for eye irritation). 02/20/21   [provider]  triazolam  (HALCION ) 0.125 MG tablet Take 1.5 tablets (0.1875 mg  total) by mouth at bedtime. Pt takes 1.5 tabs at bedtime Patient taking differently: Take 0.25 mg by mouth at bedtime. 08/29/17   Anner Alm ORN, MD  valACYclovir (VALTREX) 500 MG tablet Take 500 mg by mouth daily. 11/28/23 11/27/24  [provider]     Allergies    Apremilast, Aspirin , Leflunomide, Methotrexate, Rosuvastatin calcium , and Other   Review of Systems   Review of Systems Please see HPI for pertinent positives and negatives  Physical Exam BP (!) 177/151   Pulse 100   Temp (!) 101.2 F (38.4 C) (Oral)   Resp 16   Ht 6' (1.829 m)   Wt 99.8 kg   SpO2 92%   BMI 29.84 kg/m   Physical Exam Vitals and nursing note reviewed.  Constitutional:      Appearance: Normal appearance.  HENT:     Head: Normocephalic  and atraumatic.     Nose: Nose normal.     Mouth/Throat:     Mouth: Mucous membranes are dry.     Comments: mucositis  Eyes:     Extraocular Movements: Extraocular movements intact.     Conjunctiva/sclera: Conjunctivae normal.    Cardiovascular:     Rate and Rhythm: Normal rate.  Pulmonary:     Effort: Pulmonary effort is normal.     Breath sounds: Normal breath sounds.  Abdominal:     General: Abdomen is flat.     Palpations: Abdomen is soft.     Tenderness: There is no abdominal tenderness.   Musculoskeletal:        General: No swelling. Normal range of motion.     Cervical back: Neck supple.     Comments: Erythema, warmth to L dorsal hand, superficial abrasion   Skin:    General: Skin is warm and dry.     Comments: Circular erythema and mild induration to R flank, possibly some central clearing. No known tick bite.    Neurological:     General: No focal deficit present.     Mental Status: He is alert.   Psychiatric:        Mood and Affect: Mood normal.     ED Results / Procedures / Treatments   EKG EKG Interpretation Date/Time:  Sunday January 18 2024 00:38:26 EDT Ventricular Rate:  94 PR Interval:    QRS Duration:  150 QT  Interval:  390 QTC Calculation: 488 R Axis:   65  Text Interpretation: Sinus rhythm Right bundle branch block No significant change since last tracing Confirmed by Roselyn Dunnings [id number]) on 01/18/2024 12:58:49 AM  Procedures .Critical Care  Performed by: Roselyn Dunnings NOVAK, MD Authorized by: Roselyn Dunnings NOVAK, MD   Critical care provider statement:    Critical care time (minutes):  60   Critical care time was exclusive of:  Separately billable procedures and treating other patients   Critical care was necessary to treat or prevent imminent or life-threatening deterioration of the following conditions:  Sepsis   Critical care was time spent personally by me on the following activities:  Development of treatment plan with patient or surrogate, discussions with consultants, evaluation of patient's response to treatment, examination of patient, ordering and review of laboratory studies, ordering and review of radiographic studies, ordering and performing treatments and interventions, pulse oximetry, re-evaluation of patient's condition and review of old charts   Care discussed with: admitting provider     Medications Ordered in the ED Medications  metroNIDAZOLE  (FLAGYL ) IVPB 500 mg (500 mg Intravenous New Bag/Given 01/18/24 0205)  vancomycin  (VANCOCIN ) IVPB 1000 mg/200 mL premix (has no administration in time range)  lactated ringers  bolus 1,000 mL (1,000 mLs Intravenous New Bag/Given 01/18/24 0102)  ceFEPIme  (MAXIPIME ) 2 g in sodium chloride  0.9 % 100 mL IVPB (0 g Intravenous Stopped 01/18/24 0200)  acetaminophen  (TYLENOL ) tablet 1,000 mg (1,000 mg Oral Given 01/18/24 0106)  iohexol  (OMNIPAQUE ) 300 MG/ML solution 100 mL (100 mLs Intravenous Contrast Given 01/18/24 0119)    Initial Impression and Plan  Patient with complex history here with fever, confusion and general weakness. Sepsis protocol initiated, no clear source by history will begin broad spectrum Abx. Check labs, CT CAP for source.  1L IVF pending initial lactic acid.   ED Course   Clinical Course as of 01/18/24 0251  Sun Jan 18, 2024  0109 UA neg for infection. CBC with normal WBC. INR is normal.  [  CS]  0109 I personally viewed the images from radiology studies and agree with radiologist interpretation: CXR without infiltrates or poor inspiration. CT is pending to eval occult PNA [CS]  0114 CMP is unremarkable. Lactic acid is normal.  [CS]  0203 I personally viewed the images from radiology studies and agree with radiologist interpretation: CT neg for source of infection.  [CS]  0204 No clear source identified, could be from his rash. Consider tick borne illness given appearance of rash, but as above, no reported tick bites. Will discuss with hospitalist.  [CS]  0230 Spoke with Dr. Keturah, Hospitalist, who will evaluate for admission.  [CS]  0250 Patient more agitated/restless and pulling at monitor/IV lines. Will give a dose of haldol.  [CS]    Clinical Course User Index [CS] Roselyn Carlin NOVAK, MD     MDM Rules/Calculators/A&P Medical Decision Making Problems Addressed: Cellulitis of hand: acute illness or injury Rash: acute illness or injury Sepsis with encephalopathy without septic shock, due to unspecified organism [facility]): acute illness or injury  Amount and/or Complexity of Data Reviewed Labs: ordered. Decision-making details documented in ED Course. Radiology: ordered and independent interpretation performed. Decision-making details documented in ED Course. ECG/medicine tests: ordered and independent interpretation performed. Decision-making details documented in ED Course.  Risk OTC drugs. Prescription drug management. Decision regarding hospitalization.     Final Clinical Impression(s) / ED Diagnoses Final diagnoses:  Sepsis with encephalopathy without septic shock, due to unspecified organism [facility])  Rash  Cellulitis of hand    Rx / DC Orders ED Discharge Orders     None         Roselyn Carlin NOVAK, MD 01/18/24 [phone]

## 2024-01-19 DIAGNOSIS — A419 Sepsis, unspecified organism: Secondary | ICD-10-CM | POA: Diagnosis not present

## 2024-01-19 LAB — GLUCOSE, CAPILLARY
Glucose-Capillary: 147 mg/dL — ABNORMAL HIGH (ref 70–99)
Glucose-Capillary: 214 mg/dL — ABNORMAL HIGH (ref 70–99)
Glucose-Capillary: 189 mg/dL — ABNORMAL HIGH (ref 70–99)
Glucose-Capillary: 162 mg/dL — ABNORMAL HIGH (ref 70–99)

## 2024-01-19 MED ORDER — PREDNISONE 20 MG PO TABS
50.0000 mg | ORAL_TABLET | Freq: Every day | ORAL | Status: DC
  Administered 2024-01-20: 50 mg via ORAL
  Filled 2024-01-19: qty 1

## 2024-01-19 MED ORDER — TRIAZOLAM 0.25 MG PO TABS: 0.2500 mg | ORAL_TABLET | Freq: Every day | ORAL | Status: DC

## 2024-01-19 MED ORDER — VALACYCLOVIR HCL 500 MG PO TABS
500.0000 mg | ORAL_TABLET | Freq: Every day | ORAL | Status: DC
  Administered 2024-01-19 – 2024-01-20 (×2): 500 mg via ORAL
  Filled 2024-01-19 (×2): qty 1

## 2024-01-19 MED ORDER — TRIAZOLAM 0.25 MG PO TABS
0.2500 mg | ORAL_TABLET | Freq: Every day | ORAL | Status: DC
  Administered 2024-01-20: 0.25 mg via ORAL

## 2024-01-19 MED ORDER — PREDNISONE 20 MG PO TABS: 50.0000 mg | ORAL_TABLET | Freq: Every day | ORAL | Status: DC

## 2024-01-19 MED ORDER — SENNOSIDES-DOCUSATE SODIUM 8.6-50 MG PO TABS
1.0000 | ORAL_TABLET | Freq: Every day | ORAL | Status: DC
  Filled 2024-01-19: qty 1

## 2024-01-19 MED ORDER — GABAPENTIN 100 MG PO CAPS: 100.0000 mg | ORAL_CAPSULE | Freq: Every evening | ORAL | Status: DC | PRN

## 2024-01-19 NOTE — Plan of Care (Signed)

## 2024-01-19 NOTE — Progress Notes (Signed)
 [facility] PROGRESS NOTE  Arthur Lutz (DOB: Dec 10, 1940) FMW:[id number] PCP: Bertell Satterfield, MD  Brief Narrative: Arthur Lutz is an 83 year old with PMH significant for metastatic Melanoma mets to brain S/P SBRT, Immunotherapy with near CR, IRAE with psoriatic flare and mucositis, adrenal insuficiency on chronic glucocorticoid, CAD with PCI, HFpEF, HTN, DM type 2, HLD, remote history of bladder cancer, presents with AMS, weakness.  Patient has also developed left hand wrist swelling redness and pain. He presented with acute metabolic encephalopathy, left hand with concern of infection.  He was also noted to have oxygen saturation in the 80.   Subjective: Feels significantly better, family at bedside.   Objective: BP 116/65 (BP Location: Right Arm)   Pulse 98   Temp 98.3 F (36.8 C) (Oral)   Resp 18   Ht 6' (1.829 m)   Wt 99.8 kg   SpO2 90%   BMI 29.84 kg/m   Gen: Elderly WDWN male in no distress Pulm: Clear, nonlabored  CV: RRR, no MRG GI: Soft, NT, ND, +BS  Neuro: Alert and oriented. No new focal deficits. Ext: Warm, dry. Diffuse modestly tender edema and erythema to dorsal left hand. No fluctuance. Full AROM. Skin: Small eschar on radial dorsal surface without exudate. No other rashes, lesions or ulcers on visualized skin   Assessment & Plan: Sepsis secondary to left hand cellulitis infection Patient presented with left hand swelling, he has an abrasion at the dorsum of the hand, fever temperature 103, tachycardic heart rate 110s, tachypnea respiration rate 20.  Source of infection left hand. Sepsis physiology resolved.   Left hand swelling and redness: Concern for cellulitis. Significantly improving. Failed outpatient abx.  - CT reviewed, no fluid collection or bony erosions. +diffuse dorsal edema consistent with exam, no gas. Mod arthritic changes at the wrist, changes consistent per radiology in setting of CPPD arthropathy. Suggest orthopedics follow up.  -  Inflammatory markers modestly elevated with normal PCT and uric acid.     Acute metabolic encephalopathy: - Patient presented with lethargy, worsening confusion, weakness. - In the setting of sepsis and infection and dehydration. - He has been more somnolent also after he received Haldol - TSH: 0.9 B12: 1197  - Has returned to baseline. Continue delirium precautions and Tx as elsewhere defined.    Acute on chronic hypoxic respiratory failure Emphysema noted on x-ray Family reports chronic oxygen saturations in the upper 80s CT chest basilar atelectasis.  - Ambulatory pulse oximetry planned 6/24.    Acute urinary retention:  Known BPH.  -Pt strongly prefers to attempt voiding trial prior to discharge. Will DC foley, d/w RN today. Continue newly started tamsulosin.  - Suggest urology follow up regardless for LUTS.    Ground-level fall: Deconditioning Will need PT > No f/u recommended. Doing much better.  CT head no acute finding   Hypokalemia; replete orally.    Metastatic melanoma with mets to brain s/p SBRT, immunotherapy with near-CR: Follows with [facility], off immunotherapy at this time.  -per wife last MRI brain was negative for mets.    IRAE with psoriatic flare and mucositis: Noted, on oxycodone  for pain control. Higher dose steroids appears to be improving mucositis dramatically.  Adrenal insufficiency: Baseline recent prednisone  10 mg daily. Given hydrocortisone 50 mg IV BID and much improved, transition to higher dose prednisone  and monitor. Anticipate protracted taper.    CAD with PCI: Not on aspirin . Continue home atorvastatin .  HFpEF: Without acute exacerbation, TTE '22 LVEF 55-60%, grade  1 diastolic dysfunction.  Aortic sclerosis with murmur: chronic.  HTN: Continue home Amlodipine , and ISMN. Held hydralazine  and Lisinopril  for now.  DM type 2: Diet controlled. SSI for very sensitive.  HLD: Statin remote hx bladder CA: Noted.  Venous insufficiency: Noted  Arthur KATHEE Come, MD [facility] [email or web address] 01/19/2024, 6:13 PM

## 2024-01-19 NOTE — Progress Notes (Signed)
 Patient voided of clear, yellow urine in urinal. Bladder scanned patient and recorded 85ml.

## 2024-01-19 NOTE — Evaluation (Signed)
 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 Patient not able to provide significant history. Wife and daughter report that he has had decreased oral intake over the past few days. And over the same time period developed pain and swelling at his L wrist / hand. Family are not sure about the details, unclear if he had fallen or injured before this evening. Seen by urgent care yesterday and felt may be cellulitis; had reported hitting L hand on a metal machine causing abrasion to dorsum of hand. so Rx'd course of Augmentin  although had not picked up yet. Family also noted a round erythematous patch over his right flank, were wondering about an insect bite although none seen. Also developed nausea but no vomiting, no abd pain or diarrhea. Has chronic mucositis from suspected IRAE of immunotherapy. Otherwise no recent illness, no fevers noted prior to ED, no chills, chest pain, SOB, cough, cold symptoms, other rashes than mentioned. Had become slightly confused which happens when dehydrated. But around midnight had a ground level fall, think he hit his head. And needed lots of help to get up. After the fall family decided to call EMS and bring him in.      Otherwise family notes that he typically has low O2 sats in the high 80s on prior evaluations. He is apparently very against having oxygen therapy but family are not sure why he is so against it.   Clinical Impression  Patient functioning near/at baseline  for functional mobility and gait other than c/o severe pain in left hand/wrist having difficulty putting on shoes and functional activities with left hand, otherwise demonstrates good return for ambulating in room, hallways without problem.  Plan:  Patient discharged from physical therapy to care of nursing for ambulation daily as tolerated for length of stay.          If plan is discharge home, recommend the following: Help with stairs or ramp for entrance;A little help with bathing/dressing/bathroom;Assistance with cooking/housework   Can travel by private vehicle        Equipment Recommendations None recommended by PT  Recommendations for Other Services       Functional Status Assessment Patient has had a recent decline in their functional status and demonstrates the ability to make significant improvements in function in a reasonable and predictable amount of time.     Precautions / Restrictions Precautions Precautions: None Recall of Precautions/Restrictions: Intact Restrictions Weight Bearing Restrictions Per Provider Order: No      Mobility  Bed Mobility Overal bed mobility: Modified Independent                  Transfers Overall transfer level: Modified independent                      Ambulation/Gait Ambulation/Gait assistance: Modified independent (Device/Increase time) Gait Distance (Feet): 120 Feet Assistive device: None Gait Pattern/deviations: WFL(Within Functional Limits) Gait velocity: near normal     General Gait Details: grossly WFL with good return for ambulation  in room, hallways without loss of balance or need for an AD  Stairs            Wheelchair Mobility     Tilt Bed    Modified Rankin (Stroke Patients Only)       Balance Overall balance assessment: No apparent balance deficits (not formally assessed)                                           Pertinent Vitals/Pain Pain Assessment Pain  Assessment: Faces Faces Pain Scale: Hurts even more Pain Location: left hand wrist Pain Descriptors / Indicators: Discomfort, Grimacing, Sore, Sharp Pain Intervention(s): Limited activity within patient's tolerance, Monitored during session, Repositioned    Home Living Family/patient expects to be discharged to:: Private residence Living Arrangements: Spouse/significant other Available Help at Discharge: Family;Available 24 hours/day Type of Home: House Home Access: Stairs to enter Entrance Stairs-Rails: Right Entrance Stairs-Number of Steps: 5 Alternate Level Stairs-Number of Steps: has elevator to reach 2nd floor Home Layout: Two level Home Equipment: Shower seat;Grab bars - tub/shower      Prior Function Prior Level of Function : Independent/Modified Independent             Mobility Comments: Community ambulation without AD, drives, works ADLs Comments: Independent     Extremity/Trunk Assessment   Upper Extremity Assessment Upper Extremity Assessment: Defer to OT evaluation    Lower Extremity Assessment Lower Extremity Assessment: Overall WFL for tasks assessed    Cervical / Trunk Assessment Cervical / Trunk Assessment: Normal  Communication   Communication Communication: No apparent difficulties    Cognition Arousal: Alert Behavior During Therapy: WFL for tasks assessed/performed   PT - Cognitive impairments: No apparent impairments                         Following commands: Intact       Cueing Cueing Techniques: Verbal cues     General Comments      Exercises     Assessment/Plan    PT Assessment Patient does not need any further PT services  PT Problem List         PT Treatment Interventions      PT Goals (Current goals can be found in the Care Plan section)  Acute Rehab PT Goals Patient Stated Goal: return home with family to assist PT Goal Formulation: With patient Time For Goal Achievement: 01/19/24 Potential to Achieve  Goals: Good    Frequency       Co-evaluation               AM-PAC PT 6 Clicks Mobility  Outcome Measure Help needed turning from your back to your side while in a flat bed without using bedrails?: None Help needed moving from lying on your back to sitting on the side of a flat bed without using bedrails?: None Help needed moving to and from a bed to a chair (including a wheelchair)?: None Help needed standing up from a chair using your arms (e.g., wheelchair or bedside chair)?: None Help needed to walk in hospital room?: None Help needed climbing 3-5 steps with a railing? : A Little 6 Click Score: 23    End of Session   Activity Tolerance: Patient tolerated treatment well Patient left: in chair;with call bell/phone within reach;with family/visitor present Nurse Communication: Mobility status PT Visit Diagnosis:  Unsteadiness on feet (R26.81);Other abnormalities of gait and mobility (R26.89);Muscle weakness (generalized) (M62.81)    Time: 9053-8990 PT Time Calculation (min) (ACUTE ONLY): 23 min   Charges:   PT Evaluation $PT Eval Moderate Complexity: 1 Mod PT Treatments $Therapeutic Activity: 23-37 mins PT General Charges $$ ACUTE PT VISIT: 1 Visit         1:46 PM, 01/19/24 Lynwood Music, MPT Physical Therapist with [facility] [phone] office [phone] mobile phone

## 2024-01-20 DIAGNOSIS — A419 Sepsis, unspecified organism: Secondary | ICD-10-CM | POA: Diagnosis not present

## 2024-01-20 LAB — GLUCOSE, CAPILLARY: Glucose-Capillary: 140 mg/dL — ABNORMAL HIGH (ref 70–99)

## 2024-01-20 MED ORDER — TAMSULOSIN HCL 0.4 MG PO CAPS
0.4000 mg | ORAL_CAPSULE | Freq: Every day | ORAL | 0 refills | Status: DC
Start: 2024-01-20 — End: 2024-04-28

## 2024-01-20 MED ORDER — CEFADROXIL 500 MG PO CAPS
500.0000 mg | ORAL_CAPSULE | Freq: Two times a day (BID) | ORAL | 0 refills | Status: AC
Start: 2024-01-20 — End: 2024-01-24

## 2024-01-20 MED ORDER — DOXYCYCLINE HYCLATE 100 MG PO CAPS: 100.0000 mg | ORAL_CAPSULE | Freq: Two times a day (BID) | ORAL | 0 refills | Status: AC

## 2024-01-20 MED ORDER — PREDNISONE 10 MG PO TABS: ORAL_TABLET | ORAL | 0 refills | Status: AC

## 2024-01-20 NOTE — Evaluation (Signed)
 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 Patient not able to provide significant history. Wife and daughter report that he has had decreased oral intake over the past few days. And over the same time period developed pain and swelling at his L wrist / hand. Family are not sure about the details, unclear if he had fallen or injured before this evening. Seen by urgent care yesterday and felt may be cellulitis; had reported hitting L hand on a metal machine causing abrasion to dorsum of hand. so Rx'd course of Augmentin  although had not picked up yet. Family also noted a round erythematous patch over his right flank, were wondering about an insect bite although none seen. Also developed nausea but no vomiting, no abd pain or diarrhea. Has chronic mucositis from suspected IRAE of immunotherapy. Otherwise no recent illness, no fevers noted prior to ED, no chills, chest pain, SOB, cough, cold symptoms, other rashes than mentioned. Had become slightly confused which happens when dehydrated. But around midnight had a ground level fall, think he hit his head. And needed lots of help to get up. After the fall family decided to call EMS and bring him in.      Otherwise family notes that he typically has low O2 sats in the high 80s on prior evaluations. He is apparently very against having oxygen therapy but family are not sure why he is so against it.     Clinical Impressions Pt agreeable to brief OT  evaluation. Pt L wrist noted to be swollen and stiff. Limited in A/ROM and P/ROM of wrist. Poor grip strength and A/ROM of 4th and 5th digits for the grasp. Pt able to ambulate without assist. Single hand held assist for bed mobility. Wife reports being home at all times. Deferring to outpatient OT for further OT assessment and treatment. Pt is not recommended for further acute OT services and will be discharged to care of nursing staff for remaining length of stay.      If plan is discharge home, recommend the following:   A little help with bathing/dressing/bathroom     Functional Status Assessment   Patient has had a recent decline in their functional status and demonstrates the ability to make significant improvements in function in a reasonable and predictable amount of time.     Equipment Recommendations   None recommended by OT             Precautions/Restrictions   Precautions Precautions: None Recall of Precautions/Restrictions: Intact Restrictions Weight Bearing Restrictions Per Provider Order: No     Mobility Bed Mobility Overal bed mobility: Needs Assistance Bed Mobility: Supine to Sit     Supine to sit: Min assist, HOB elevated     General bed mobility comments: single hand held assist    Transfers Overall transfer level: Independent                 General transfer comment: Pt observed to be ambulating in the hall.  Balance Overall balance assessment: No apparent balance deficits (not formally assessed)                                         ADL either performed or assessed with clinical judgement   ADL Overall ADL's : Needs assistance/impaired                                       General ADL Comments: Min A needed for fine motor componenet of dressing most likely. Independent otherwise.     Vision Baseline Vision/History: 1 Wears glasses Ability to See in Adequate Light: 1  Impaired Patient Visual Report: No change from baseline Vision Assessment?: Wears glasses for reading     Perception Perception: Not tested       Praxis Praxis: Not tested       Pertinent Vitals/Pain Pain Assessment Pain Assessment: No/denies pain     Extremity/Trunk Assessment Upper Extremity Assessment Upper Extremity Assessment: LUE deficits/detail LUE Deficits / Details: Wrist limited to minimal A/ROM as wel las P/ROM. Wrist swollen with very limited mobility. Composit grasp only partial with 2nd and 3rd digit, unable to flex remaining 4th and 5th digits. Poor fine motor skills. LUE Coordination: decreased fine motor   Lower Extremity Assessment Lower Extremity Assessment: Defer to PT evaluation   Cervical / Trunk Assessment Cervical / Trunk Assessment: Normal   Communication Communication Communication: No apparent difficulties   Cognition Arousal: Alert Behavior During Therapy: WFL for tasks assessed/performed Cognition: No apparent impairments                               Following commands: Intact       Cueing  General Comments   Cueing Techniques: Verbal cues                 Home Living Family/patient expects to be discharged to:: Private residence Living Arrangements: Spouse/significant other Available Help at Discharge: Family;Available 24 hours/day Type of Home: House Home Access: Stairs to enter [employer] of Steps: 5 Entrance Stairs-Rails: Right Home Layout: Two level Alternate Level Stairs-Number of Steps: has elevator to reach 2nd floor   Bathroom Shower/Tub: Chief Strategy Officer: Standard Bathroom Accessibility: Yes   Home Equipment: Shower seat;Grab bars - tub/shower   Additional Comments: per chart      Prior Functioning/Environment Prior Level of Function : Independent/Modified Independent             Mobility Comments: Community ambulation without AD, drives, works ADLs  Comments: Independent     End of Session    Activity Tolerance: Patient tolerated treatment well Patient left: in bed;with call bell/phone within reach;with bed alarm set  OT Visit Diagnosis: Muscle weakness (generalized) (M62.81);Other symptoms and signs involving cognitive function                Time: 9149-9145 OT Time Calculation (min): 4 min Charges:  OT General Charges $OT Visit: 1 Visit OT Evaluation $OT Eval Low Complexity: 1 Low  Ashvik Grundman OT, MOT  Jayson Person 01/20/2024, 9:19 AM

## 2024-01-20 NOTE — Discharge Summary (Signed)
 Physician Discharge Summary   Patient: Arthur Lutz MRN: [id number] DOB: [birth year]  Admit date:     01/18/2024  Discharge date: 01/20/24  Discharge Physician: Bernardino KATHEE Come   PCP: Bertell Satterfield, MD   Recommendations at discharge:  Follow up with PCP in 1-2 weeks, suggest recheck BMP, CBC.  Follow up with urology in next month. Pt had acute urinary retention but passed voiding trial after initiation of tamsulosin which is continued at discharge.  Follow up with oncology as per routine. Note significant improvement in mucositis with higher dose steroids which are prescribed in taper at discharge.   Discharge Diagnoses: Active Problems:   Acute metabolic encephalopathy   Sepsis (HCC)   Dehydration   Acute urinary retention   Chronic hypoxic respiratory failure (HCC)   Ground-level fall  Hospital Course: Arthur Lutz is an 83 year old with PMH significant for metastatic Melanoma mets to brain S/P SBRT, Immunotherapy with near CR, IRAE with psoriatic flare and mucositis, adrenal insuficiency on chronic glucocorticoid, CAD with PCI, HFpEF, HTN, DM type 2, HLD, remote history of bladder cancer, presents with AMS, weakness.  Patient has also developed left hand wrist swelling redness and pain. He presented with acute metabolic encephalopathy, left hand with concern of infection.  He was also noted to have oxygen saturation in the 80%'s  Assessment and Plan: Sepsis secondary to left hand cellulitis infection Patient presented with left hand swelling, he has an abrasion at the dorsum of the hand, fever temperature 103, tachycardic heart rate 110s, tachypnea respiration rate 20.  Source of infection left hand. Sepsis physiology resolved.   Left hand cellulitis. Significantly improving. Failed outpatient abx.  - CT reviewed, no fluid collection or bony erosions. +diffuse dorsal edema consistent with exam, no gas. Mod arthritic changes at the wrist, changes consistent per radiology in  setting of CPPD arthropathy. Suggest orthopedics follow up.  - Inflammatory markers modestly elevated with normal PCT and uric acid.   - Complete course with antibiotics after discharge.    Acute metabolic encephalopathy: - Patient presented with lethargy, worsening confusion, weakness. - In the setting of sepsis and infection and dehydration.  - TSH: 0.9, vitamin B12:1197  - Has returned to baseline.     Acute hypoxic respiratory failure Emphysema noted on x-ray Family reports chronic oxygen saturations in the upper 80s but isn't on O2 at home.  CT chest basilar atelectasis.  - Ambulatory pulse oximetry performed 6/24 and patient felt well and did not desaturate below 92%, so does not qualify for supplemental oxygen. Suspect initial hypoxemia may have been due to hypoventilation due to depressed mental status.    Acute urinary retention:  Known BPH.  - Continue newly started tamsulosin.  - Suggest urology follow up regardless for LUTS.    Ground-level fall: Deconditioning. After treatment, he is doing much better, fully independent and needs no PT follow up. CT head no acute finding. Can continue NSAID but urged to pick either meloxicam or ibuprofen .    Hypokalemia; replete orally.    Metastatic melanoma with mets to brain s/p SBRT, immunotherapy with near-CR: Follows with [facility], off immunotherapy at this time.  -per wife last MRI brain was negative for mets.    IRAE with psoriatic flare and mucositis: Noted, on oxycodone  for pain control. Higher dose steroids appears to be improving mucositis dramatically.  Adrenal insufficiency: Baseline recent prednisone  10 mg daily. Given hydrocortisone 50 mg IV BID and much improved, transitioned to higher dose prednisone  and will  initiate a protracted taper.    CAD with PCI: Not on aspirin . Continue home atorvastatin .  HFpEF: Without acute exacerbation, TTE '22 LVEF 55-60%, grade 1 diastolic dysfunction.  Aortic sclerosis with murmur:  chronic.  HTN: Continue home Amlodipine , and ISMN. Held hydralazine  and Lisinopril  for now.  DM type 2: Diet controlled. SSI for very sensitive.  HLD: Statin remote hx bladder CA: Noted.  Venous insufficiency: Noted  Consultants: None Procedures performed: None  Disposition: Home Diet recommendation:  Cardiac diet DISCHARGE MEDICATION: Allergies as of 01/20/2024       Reactions   Apremilast Other (See Comments)   unknown   Aspirin  Other (See Comments)   Was told to not take this due to having macular degeneration   Leflunomide Other (See Comments)   unknown   Methotrexate Other (See Comments)   unknown   Rosuvastatin Calcium  Other (See Comments)   Aches and cramps   Other Rash, Other (See Comments)   Red meat = develops a rash on the back        Medication List     STOP taking these medications    amoxicillin -clavulanate 875-125 MG tablet Commonly known as: AUGMENTIN    naproxen sodium 220 MG tablet Commonly known as: ALEVE       TAKE these medications    acetaminophen  650 MG CR tablet Commonly known as: TYLENOL  Take 1,300 mg by mouth every 8 (eight) hours as needed for pain.   amLODipine  10 MG tablet Commonly known as: NORVASC  TAKE (1) TABLET BY MOUTH ONCE DAILY.   ascorbic acid  1000 MG tablet Commonly known as: VITAMIN C Take 1,000 mg by mouth daily.   atorvastatin  40 MG tablet Commonly known as: LIPITOR Take 1 tablet (40 mg total) by mouth daily. What changed: when to take this   azelastine  0.1 % nasal spray Commonly known as: ASTELIN  Place 1 spray into the nose daily as needed (allergies).   cefadroxil 500 MG capsule Commonly known as: DURICEF Take 1 capsule (500 mg total) by mouth 2 (two) times daily for 4 days.   clobetasol 0.05 % external solution Commonly known as: TEMOVATE Apply 1 application topically daily as needed (irritation).   CoQ10 100 MG Caps Take 100 mg by mouth daily.   doxycycline 100 MG capsule Commonly known as:  VIBRAMYCIN Take 1 capsule (100 mg total) by mouth 2 (two) times daily for 4 days.   EPINEPHrine  0.3 mg/0.3 mL Soaj injection Commonly known as: EPI-PEN Inject 0.3 mg into the muscle as needed for anaphylaxis.   ezetimibe  10 MG tablet Commonly known as: ZETIA  Take 10 mg by mouth daily.   furosemide  20 MG tablet Commonly known as: LASIX  Take 1 tablet (20 mg total) by mouth daily as needed. An may take an additional 20 mg if needed What changed:  reasons to take this additional instructions   gabapentin 100 MG capsule Commonly known as: NEURONTIN Take 100 mg by mouth at bedtime as needed (pain).   hydrALAZINE  50 MG tablet Commonly known as: APRESOLINE  TAKE (1) TABLET BY MOUTH (3) TIMES DAILY. What changed: See the new instructions.   isosorbide  mononitrate 60 MG 24 hr tablet Commonly known as: IMDUR  TAKE (1) TABLET BY MOUTH ONCE DAILY.   lisinopril  40 MG tablet Commonly known as: ZESTRIL  TAKE 1/2 TABLET BY MOUTH ONCE DAILY.   loperamide  2 MG capsule Commonly known as: IMODIUM  Take 1 capsule (2 mg total) by mouth 4 (four) times daily as needed for diarrhea or loose stools.   Lotemax  0.5 % Oint Generic drug: Loteprednol Etabonate Place 1 application into the left eye daily as needed (irritation, dry eyes).   magic mouthwash Soln Take 5 mLs by mouth daily. Suspension contains equal amounts of Maalox Extra Strength, nystatin, and diphenhydramine .   magnesium  gluconate 500 MG tablet Commonly known as: MAGONATE Take 500 mg by mouth daily.   meloxicam 15 MG tablet Commonly known as: MOBIC Take 15 mg by mouth daily as needed for pain.   Naltrex 4.5 MG Caps Generic drug: Naltrexone HCl (Pain) Take 4.5 mg by mouth every evening.   Otezla 30 MG Tabs Generic drug: Apremilast Take 1 tablet by mouth 2 (two) times daily. Take as directed   OVER THE COUNTER MEDICATION Take 2 Capfuls by mouth daily. HistaQuel (Histamine)   OVER THE COUNTER MEDICATION Take 1 capsule by  mouth daily. Blood Sugar Formula   oxyCODONE  5 MG immediate release tablet Commonly known as: Oxy IR/ROXICODONE  Take 5 mg by mouth every 4 (four) hours as needed for moderate pain (pain score 4-6).   prednisoLONE acetate 1 % ophthalmic suspension Commonly known as: PRED FORTE Place 1 drop into both eyes 2 (two) times daily as needed (irritation).   predniSONE  10 MG tablet Commonly known as: DELTASONE  Take 5 tablets (50 mg total) by mouth daily with breakfast for 2 days, THEN 4 tablets (40 mg total) daily with breakfast for 2 days, THEN 3 tablets (30 mg total) daily with breakfast for 2 days, THEN 2 tablets (20 mg total) daily with breakfast for 2 days, THEN 1 tablet (10 mg total) daily with breakfast for 2 days, THEN 0.5 tablets (5 mg total) daily with breakfast for 20 days. Start taking on: January 20, 2024 What changed: See the new instructions.   spironolactone  25 MG tablet Commonly known as: ALDACTONE  Take 1 tablet (25 mg total) by mouth daily.   tamsulosin 0.4 MG Caps capsule Commonly known as: FLOMAX Take 1 capsule (0.4 mg total) by mouth daily.   tobramycin 0.3 % ophthalmic solution Commonly known as: TOBREX Place 1 drop into both eyes daily as needed (for eye irritation).   triazolam  0.125 MG tablet Commonly known as: HALCION  Take 1.5 tablets (0.1875 mg total) by mouth at bedtime. Pt takes 1.5 tabs at bedtime What changed:  how much to take additional instructions   valACYclovir 500 MG tablet Commonly known as: VALTREX Take 500 mg by mouth daily.   VITAMIN D  PO Take 1 tablet by mouth daily.        Follow-up Information     Bertell Satterfield, MD Follow up.   Specialty: Internal Medicine Contact information: [address] [place] [address] [phone]         Nicholaus Tanda LITTIE DOUGLAS, MD Follow up.   Specialty: Urology Contact information: [address] [place] [address] [phone]                Discharge Exam: Fredricka Weights    01/18/24 0027  Weight: 99.8 kg  Well-appearing elderly male in no distress. He had taken off telemetry leads and requested discharge this morning.  Edema and erythema significantly improved in left proximal hand, distal forearm, full ROM. No fluctuance.  Oral mucosal lesions are hemostatic and decreased significantly in size from pictures provided by patient.  Condition at discharge: stable  The results of significant diagnostics from this hospitalization (including imaging, microbiology, ancillary and laboratory) are listed below for reference.   Imaging Studies: CT HAND LEFT W CONTRAST Result Date: 01/19/2024  CLINICAL DATA:  Wrist pain.  Swelling. EXAM: CT OF THE LEFT WRIST AND HAND WITH CONTRAST TECHNIQUE: Multidetector CT imaging of the left wrist and hand was performed according to the standard protocol following intravenous contrast administration. RADIATION DOSE REDUCTION: This exam was performed according to the departmental dose-optimization program which includes automated exposure control, adjustment of the mA and/or kV according to patient size and/or use of iterative reconstruction technique. CONTRAST:  OMNIPAQUE  IOHEXOL  300 MG/ML  SOLN COMPARISON:  Left wrist and hand radiographs dated 01/17/2024. Left hand radiographs dated 08/05/2021. FINDINGS: Bones/Joint/Cartilage No acute fracture or dislocation. Normal alignment. Moderate radiocarpal joint space narrowing. Distal radioulnar joint space narrowing and osteophytosis. Chondrocalcinosis within the radiocarpal joint, region of the TFCC, and mid carpal row, as can be seen in the setting of crystal arthropathy, such as CPPD arthropathy. Moderate first CMC joint space narrowing and osteophytosis. Mild-to-moderate triscaphe joint space narrowing and osteophytosis. Mild-to-moderate second CMC joint space narrowing. No definite erosive changes or evidence of acute osteolysis. Diffuse interphalangeal joint space narrowing. Ligaments  Ligaments are suboptimally evaluated by CT. Muscles and Tendons Muscles are within normal limits. No discrete intramuscular collection identified. Flexor and extensor tendons are grossly intact. Soft tissue Soft tissue swelling of the dorsal wrist and hand. No discrete loculated fluid collection identified. No soft tissue gas. Punctate 5 mm radiopaque foreign body again noted in the soft tissues adjacent to the volar base of the thumb unchanged since prior radiograph dated 08/05/2021. IMPRESSION: 1. Soft tissue swelling of the dorsal wrist and hand, which could reflect cellulitis in the appropriate clinical setting. No loculated fluid collection identified. No soft tissue gas. 2. No acute osseous abnormality. 3. Moderate arthritic changes of the wrist with chondrocalcinosis in the radiocarpal joint, midcarpal row, and region of the TFCC, as can be seen in the setting of CPPD arthropathy. Electronically Signed   By: Harrietta Sherry M.D.   On: 01/19/2024 12:11   CT WRIST LEFT W CONTRAST Result Date: 01/19/2024 CLINICAL DATA:  Wrist pain.  Swelling. EXAM: CT OF THE LEFT WRIST AND HAND WITH CONTRAST TECHNIQUE: Multidetector CT imaging of the left wrist and hand was performed according to the standard protocol following intravenous contrast administration. RADIATION DOSE REDUCTION: This exam was performed according to the departmental dose-optimization program which includes automated exposure control, adjustment of the mA and/or kV according to patient size and/or use of iterative reconstruction technique. CONTRAST:  OMNIPAQUE  IOHEXOL  300 MG/ML  SOLN COMPARISON:  Left wrist and hand radiographs dated 01/17/2024. Left hand radiographs dated 08/05/2021. FINDINGS: Bones/Joint/Cartilage No acute fracture or dislocation. Normal alignment. Moderate radiocarpal joint space narrowing. Distal radioulnar joint space narrowing and osteophytosis. Chondrocalcinosis within the radiocarpal joint, region of the TFCC, and mid  carpal row, as can be seen in the setting of crystal arthropathy, such as CPPD arthropathy. Moderate first CMC joint space narrowing and osteophytosis. Mild-to-moderate triscaphe joint space narrowing and osteophytosis. Mild-to-moderate second CMC joint space narrowing. No definite erosive changes or evidence of acute osteolysis. Diffuse interphalangeal joint space narrowing. Ligaments Ligaments are suboptimally evaluated by CT. Muscles and Tendons Muscles are within normal limits. No discrete intramuscular collection identified. Flexor and extensor tendons are grossly intact. Soft tissue Soft tissue swelling of the dorsal wrist and hand. No discrete loculated fluid collection identified. No soft tissue gas. Punctate 5 mm radiopaque foreign body again noted in the soft tissues adjacent to the volar base of the thumb unchanged since prior radiograph dated 08/05/2021. IMPRESSION: 1. Soft tissue swelling of  the dorsal wrist and hand, which could reflect cellulitis in the appropriate clinical setting. No loculated fluid collection identified. No soft tissue gas. 2. No acute osseous abnormality. 3. Moderate arthritic changes of the wrist with chondrocalcinosis in the radiocarpal joint, midcarpal row, and region of the TFCC, as can be seen in the setting of CPPD arthropathy. Electronically Signed   By: Harrietta Sherry M.D.   On: 01/19/2024 12:11   CT HEAD WO CONTRAST ( ) Result Date: 01/18/2024 EXAM: CT HEAD WITHOUT CONTRAST 01/18/2024 04:35:20 AM TECHNIQUE: CT of the head was performed without the administration of intravenous contrast. Automated exposure control, iterative reconstruction, and/or weight based adjustment of the mA/kV was utilized to reduce the radiation dose to as low as reasonably achievable. COMPARISON: CT head without contrast 12/16/2023. CLINICAL HISTORY: AMS, fall, hx melanoma met to brain. Eval for ICH. Pt bib EMS after family called reporting decreased oral intake x 4 days. Family believes pt  is dehydrated. FINDINGS: BRAIN AND VENTRICLES: No acute hemorrhage. Gray-white differentiation is preserved. No hydrocephalus. No extra-axial collection. No mass effect or midline shift. Mild generalized atrophy and moderate diffuse white matter disease are present bilaterally. ORBITS: Bilateral lens replacements are noted. The globes and orbits are otherwise within normal limits. SINUSES: No acute abnormality. SOFT TISSUES AND SKULL: No acute soft tissue abnormality. No skull fracture. Intervertebral atherosclerotic calcifications are present in the basilar artery. IMPRESSION: 1. No acute intracranial abnormality. 2. Mild generalized atrophy and moderate diffuse white matter disease bilaterally is stable . Electronically signed by: Lonni Necessary MD 01/18/2024 05:06 AM EDT RP Workstation: [id number]   CT CHEST ABDOMEN PELVIS W CONTRAST Result Date: 01/18/2024 CLINICAL DATA:  Decreased intake, possible sepsis EXAM: CT CHEST, ABDOMEN, AND PELVIS WITH CONTRAST TECHNIQUE: Multidetector CT imaging of the chest, abdomen and pelvis was performed following the standard protocol during bolus administration of intravenous contrast. RADIATION DOSE REDUCTION: This exam was performed according to the departmental dose-optimization program which includes automated exposure control, adjustment of the mA and/or kV according to patient size and/or use of iterative reconstruction technique. CONTRAST:  OMNIPAQUE  IOHEXOL  300 MG/ML  SOLN COMPARISON:  Chest x-ray from earlier in the same day, 05/14/2021. FINDINGS: CT CHEST FINDINGS Cardiovascular: Atherosclerotic calcifications of the thoracic aorta are noted. No aneurysmal dilatation or dissection is seen. Coronary calcifications are noted. Pulmonary artery shows no large central pulmonary embolus. Timing was not performed for embolus evaluation. Left chest wall port is noted. Mediastinum/Nodes: Thoracic inlet is within normal limits. No hilar or mediastinal adenopathy is  noted. The esophagus as visualized is within normal limits. Lungs/Pleura: Mild atelectatic changes are noted in the bases bilaterally. Mild emphysematous changes are seen in the apices. No effusion is noted. Musculoskeletal: Mild degenerative changes of the thoracic spine are seen. No acute rib abnormality is noted. CT ABDOMEN PELVIS FINDINGS Hepatobiliary: Scattered cysts are noted within the liver. The gallbladder is well distended without focal abnormality. Pancreas: Unremarkable. No pancreatic ductal dilatation or surrounding inflammatory changes. Spleen: Normal in size without focal abnormality. Adrenals/Urinary Tract: Adrenal glands are within normal limits. Kidneys show normal enhancement pattern. Simple cysts are noted on the right. No complicating factors are seen. No follow-up is recommended. Bladder is within normal limits. Stomach/Bowel: Scattered fecal material is noted throughout the colon. No obstructive or inflammatory changes are seen. Mild diverticular change is noted without evidence of diverticulitis. The appendix demonstrates multiple appendicoliths although no inflammatory changes are seen. Small bowel and stomach are within normal limits. Vascular/Lymphatic: Aortic atherosclerosis. No enlarged  abdominal or pelvic lymph nodes. Reproductive: Prostate is unremarkable. Other: No abdominal wall hernia or abnormality. No abdominopelvic ascites. Musculoskeletal: Degenerative changes of lumbar spine are noted. IMPRESSION: CT of the chest: Mild basilar atelectasis. CT of the abdomen and pelvis: Diverticulosis without diverticulitis. No acute abnormality noted. Electronically Signed   By: Oneil Devonshire M.D.   On: 01/18/2024 01:58   DG Chest Portable 1 View Result Date: 01/18/2024 CLINICAL DATA:  Sepsis EXAM: PORTABLE CHEST 1 VIEW COMPARISON:  12/16/2023 FINDINGS: Left chest wall port is again seen and stable. Cardiac shadow is enlarged. Lungs are hypoinflated with basilar atelectasis left greater than  right. No bony abnormality is noted. IMPRESSION: Hypoinflation with small basilar atelectatic changes. Electronically Signed   By: Oneil Devonshire M.D.   On: 01/18/2024 01:02   DG Wrist Complete Left Result Date: 01/17/2024 CLINICAL DATA:  Two day history of hand swelling EXAM: LEFT WRIST - COMPLETE 4 VIEW; LEFT HAND - COMPLETE 3 VIEW COMPARISON:  Left hand radiograph dated 08/05/2021 FINDINGS: There is no evidence of fracture or dislocation. Diffuse degenerative changes of the hand and wrist. Unchanged punctate metallic radiodensity projecting over the palmar aspect of the thumb metacarpal phalangeal joint. Diffuse soft tissue swelling of the hand. IMPRESSION: 1. Diffuse soft tissue swelling of the hand. No acute fracture or dislocation. 2. Unchanged punctate metallic foreign body at the base of the thumb. Electronically Signed   By: Limin  Xu M.D.   On: 01/17/2024 13:22   DG Hand Complete Left Result Date: 01/17/2024 CLINICAL DATA:  Two day history of hand swelling EXAM: LEFT WRIST - COMPLETE 4 VIEW; LEFT HAND - COMPLETE 3 VIEW COMPARISON:  Left hand radiograph dated 08/05/2021 FINDINGS: There is no evidence of fracture or dislocation. Diffuse degenerative changes of the hand and wrist. Unchanged punctate metallic radiodensity projecting over the palmar aspect of the thumb metacarpal phalangeal joint. Diffuse soft tissue swelling of the hand. IMPRESSION: 1. Diffuse soft tissue swelling of the hand. No acute fracture or dislocation. 2. Unchanged punctate metallic foreign body at the base of the thumb. Electronically Signed   By: Limin  Xu M.D.   On: 01/17/2024 13:22    Microbiology: Results for orders placed or performed during the hospital encounter of 01/18/24  Culture, blood (Routine x 2)     Status: None (Preliminary result)   Collection Time: 01/18/24 12:41 AM   Specimen: BLOOD LEFT FOREARM  Result Value Ref Range Status   Specimen Description BLOOD LEFT FOREARM  Final   Special Requests   Final     BOTTLES DRAWN AEROBIC AND ANAEROBIC Blood Culture adequate volume   Culture   Final    NO GROWTH 2 DAYS Performed at [facility], [address], [place], [place] [address]    Report Status PENDING  Incomplete  Culture, blood (Routine x 2)     Status: None (Preliminary result)   Collection Time: 01/18/24 12:43 AM   Specimen: Right Antecubital; Blood  Result Value Ref Range Status   Specimen Description RIGHT ANTECUBITAL  Final   Special Requests   Final    BOTTLES DRAWN AEROBIC AND ANAEROBIC Blood Culture adequate volume   Culture   Final    NO GROWTH 2 DAYS Performed at [facility], [address], [place], [place] [address]    Report Status PENDING  Incomplete  Resp panel by RT-PCR (RSV, Flu A&B, Covid) Anterior Nasal Swab     Status: None   Collection Time: 01/18/24  2:11 AM   Specimen:  Anterior Nasal Swab  Result Value Ref Range Status   SARS Coronavirus 2 by RT PCR NEGATIVE NEGATIVE Final    Comment: (NOTE) SARS-CoV-2 target nucleic acids are NOT DETECTED.  The SARS-CoV-2 RNA is generally detectable in upper respiratory specimens during the acute phase of infection. The lowest concentration of SARS-CoV-2 viral copies this assay can detect is 138 copies/mL. A negative result does not preclude SARS-Cov-2 infection and should not be used as the sole basis for treatment or other patient management decisions. A negative result may occur with  improper specimen collection/handling, submission of specimen other than nasopharyngeal swab, presence of viral mutation(s) within the areas targeted by this assay, and inadequate number of viral copies(<138 copies/mL). A negative result must be combined with clinical observations, patient history, and epidemiological information. The expected result is Negative.  Fact Sheet for Patients:  BloggerCourse.com  Fact Sheet for Healthcare Providers:  SeriousBroker.it  This test is no t  yet approved or cleared by the [place]  FDA and  has been authorized for detection and/or diagnosis of SARS-CoV-2 by FDA under an Emergency Use Authorization (EUA). This EUA will remain  in effect (meaning this test can be used) for the duration of the COVID-19 declaration under Section 564(b)(1) of the Act, 21 U.S.C.section 360bbb-3(b)(1), unless the authorization is terminated  or revoked sooner.       Influenza A by PCR NEGATIVE NEGATIVE Final   Influenza B by PCR NEGATIVE NEGATIVE Final    Comment: (NOTE) The Xpert Xpress SARS-CoV-2/FLU/RSV plus assay is intended as an aid in the diagnosis of influenza from Nasopharyngeal swab specimens and should not be used as a sole basis for treatment. Nasal washings and aspirates are unacceptable for Xpert Xpress SARS-CoV-2/FLU/RSV testing.  Fact Sheet for Patients: BloggerCourse.com  Fact Sheet for Healthcare Providers: [employer]  This test is not yet approved or cleared by the [place]  FDA and has been authorized for detection and/or diagnosis of SARS-CoV-2 by FDA under an Emergency Use Authorization (EUA). This EUA will remain in effect (meaning this test can be used) for the duration of the COVID-19 declaration under Section 564(b)(1) of the Act, 21 U.S.C. section 360bbb-3(b)(1), unless the authorization is terminated or revoked.     Resp Syncytial Virus by PCR NEGATIVE NEGATIVE Final    Comment: (NOTE) Fact Sheet for Patients: BloggerCourse.com  Fact Sheet for Healthcare Providers: [employer]ious[employer]ker[employer]it  This test is not yet approved or cleared by the [place]  FDA and has been authorized for detection and/or diagnosis of SARS-CoV-2 by FDA under an Emergency Use Authorization (EUA). This EUA will remain in effect (meaning this test can be used) for the duration of the COVID-19 declaration under Section 564(b)(1)  of the Act, 21 U.S.C. section 360bbb-3(b)(1), unless the authorization is terminated or revoked.  Performed at [facility], [address] [place], [place] [address]     Labs: CBC: Recent Labs  Lab 01/18/24 0041 01/18/24 0715  WBC 7.7 8.4  NEUTROABS 5.7  --   HGB 12.2* 13.0  HCT 37.6* 40.3  MCV 91.5 91.2  PLT 249 239   Basic Metabolic Panel: Recent Labs  Lab 01/18/24 0041 01/18/24 0549  NA 136 137  K 3.7 3.3*  CL 98 103  CO2 28 25  GLUCOSE 183* 132*  BUN 12 11  CREATININE 0.77 0.67  CALCIUM  8.9 8.6*  MG  --  2.0  PHOS  --  3.7   Liver Function Tests: Recent Labs  Lab 01/18/24 0041  AST 14*  ALT 15  ALKPHOS 70  BILITOT 1.4*  PROT 6.8  ALBUMIN  3.5   CBG: Recent Labs  Lab 01/19/24 0720 01/19/24 1218 01/19/24 1654 01/19/24 2059 01/20/24 0752  GLUCAP 147* 214* 189* 162* 140*    Discharge time spent: greater than 30 minutes.  Signed: Bernardino KATHEE Come, MD [facility] 01/20/2024

## 2024-01-20 NOTE — Progress Notes (Signed)
 Patient pulled IV out and took telemetry off and refuses to put them back on. He states he is ready to go home and he is tired of being at the hospital.

## 2024-01-20 NOTE — TOC Progression Note (Signed)
 Transition of Care [facility]) - Progression Note    Patient Details  Name: Arthur Lutz MRN: [id number] Date of Birth: 1940-10-25  Transition of Care [facility]) CM/SW Contact  Mcarthur Saddie Kim, [place] Phone Number: 01/20/2024, 9:34 AM  Clinical Narrative:  TOC noted OT recommending outpatient OT. LCSW discussed with wife who is agreeable and requests [facility]. Referral sent.       Barriers to Discharge: Barriers Resolved  Expected Discharge Plan and Services         Expected Discharge Date: 01/20/24                                     Social Determinants of Health (SDOH) Interventions SDOH Screenings   Food Insecurity: No Food Insecurity (01/18/2024)  Housing: Low Risk  (01/18/2024)  Transportation Needs: No Transportation Needs (01/18/2024)  Utilities: Not At Risk (01/18/2024)  Financial Resource Strain: Low Risk  (03/21/2022)  Social Connections: Socially Integrated (01/18/2024)  Tobacco Use: Medium Risk (01/18/2024)    Readmission Risk Interventions    01/19/2024   12:08 PM 01/18/2024    6:19 PM  Readmission Risk Prevention Plan  Post Dischage Appt  Complete  Medication Screening Complete Complete  Transportation Screening Complete Complete

## 2024-01-20 NOTE — Plan of Care (Signed)

## 2024-01-20 NOTE — Progress Notes (Signed)
 Patient has order for Triazolam  at bedtime, order reads for patient to provide as home medication. When speaking with patient about this medication he stated that his wife administered to him tonight while she was visiting. This nurse explained to patient that his home medication needs to be brought in and sent to pharmacy for his nurse to administer. Patient verbalized understanding but will pass off in report to day shift to educate family as well. This nurse called and spoke with Marvetta, pharmacist at [facility] to explain what happened.

## 2024-01-20 NOTE — Care Management Important Message (Signed)
 Important Message  Patient Details  Name: Arthur Lutz MRN: [id number] Date of Birth: Dec 22, 1940   Important Message Given:  N/A - LOS <3 / Initial given by admissions     Duwaine LITTIE Ada 01/20/2024, 8:50 AM

## 2024-01-21 ENCOUNTER — Telehealth: Payer: Self-pay

## 2024-01-21 NOTE — Transitions of Care (Post Inpatient/ED Visit) (Signed)
   01/21/2024  Name: Arthur Lutz MRN: [id number] DOB: 05-05-1941  Today's TOC FU Call Status: Today's TOC FU Call Status:: Unsuccessful Call (1st Attempt) Unsuccessful Call (1st Attempt) Date: 01/21/24  Attempted to reach the patient regarding the most recent Inpatient/ED visit.  Follow Up Plan: Additional outreach attempts will be made to reach the patient to complete the Transitions of Care (Post Inpatient/ED visit) call.   Medford Balboa, BSN, RN [place]  VBCI - [employer] [facility] RN Care Manager [phone]

## 2024-01-22 ENCOUNTER — Telehealth: Payer: Self-pay

## 2024-01-22 NOTE — Transitions of Care (Post Inpatient/ED Visit) (Signed)
 01/22/2024  Name: Arthur Lutz MRN: [id number] DOB: [birth year]  Today's TOC FU Call Status: Today's TOC FU Call Status:: Successful TOC FU Call Completed TOC FU Call Complete Date: 01/22/24 Patient's Name and Date of Birth confirmed.  Transition Care Management Follow-up Telephone Call Date of Discharge: 01/20/24 [facility] Type of Discharge: Inpatient Admission Primary Inpatient Discharge Diagnosis:: Sepsis How have you been since you were released from the hospital?: Better Any questions or concerns?: No  Items Reviewed: Did you receive and understand the discharge instructions provided?: Yes Medications obtained,verified, and reconciled?: Yes (Medications Reviewed) Any new allergies since your discharge?: No Dietary orders reviewed?: Yes Type of Diet Ordered:: Heart Healthy Do you have support at home?: Yes People in Home [RPT]: spouse Name of Support/Comfort Primary Source: Arthur Lutz, spouse  Medications Reviewed Today: Medications Reviewed Today     Reviewed by Arthur Reusing, RN (Case Manager) on 01/22/24 at 1458  Med List Status: <None>   Medication Order Taking? Sig Documenting Provider Last Dose Status Informant  acetaminophen  (TYLENOL ) 650 MG CR tablet [id number] No Take 1,300 mg by mouth every 8 (eight) hours as needed for pain. [provider] Unknown Active Spouse/Significant Other  amLODipine  (NORVASC ) 10 MG tablet [id number] No TAKE (1) TABLET BY MOUTH ONCE DAILY. Anner Alm LELON, MD 01/16/2024 Active Spouse/Significant Other  Apremilast (OTEZLA) 30 MG TABS [id number] No Take 1 tablet by mouth 2 (two) times daily. Take as directed [provider] 01/16/2024 Active Spouse/Significant Other  ascorbic acid  (VITAMIN C) 1000 MG tablet [id number] No Take 1,000 mg by mouth daily. [provider] 01/16/2024 Active Spouse/Significant Other  atorvastatin  (LIPITOR) 40 MG tablet [id number] No Take 1 tablet (40 mg total) by  mouth daily.  Patient taking differently: Take 40 mg by mouth every evening.   Anner Alm LELON, MD 01/16/2024 Active Spouse/Significant Other  azelastine  (ASTELIN ) 0.1 % nasal spray [id number] No Place 1 spray into the nose daily as needed (allergies). [provider] Unknown Active Spouse/Significant Other  cefadroxil (DURICEF) 500 MG capsule [id number]  Take 1 capsule (500 mg total) by mouth 2 (two) times daily for 4 days. Bryn Bernardino NOVAK, MD  Active   clobetasol (TEMOVATE) 0.05 % external solution [id number] No Apply 1 application topically daily as needed (irritation). [provider] Unknown Active Spouse/Significant Other  Coenzyme Q10 (COQ10) 100 MG CAPS [id number] No Take 100 mg by mouth daily. [provider] 01/16/2024 Active Spouse/Significant Other  doxycycline (VIBRAMYCIN) 100 MG capsule [id number]  Take 1 capsule (100 mg total) by mouth 2 (two) times daily for 4 days. Bryn Bernardino NOVAK, MD  Active   EPINEPHrine  0.3 mg/0.3 mL IJ SOAJ injection [id number] No Inject 0.3 mg into the muscle as needed for anaphylaxis. [provider] Unknown Active Spouse/Significant Other  ezetimibe  (ZETIA ) 10 MG tablet [id number] No Take 10 mg by mouth daily.  Patient not taking: Reported on 12/16/2023   [provider] Not Taking Active Spouse/Significant Other  furosemide  (LASIX ) 20 MG tablet [id number] No Take 1 tablet (20 mg total) by mouth daily as needed. An may take an additional 20 mg if needed  Patient taking differently: Take 20 mg by mouth daily as needed for fluid.   Anner Alm LELON, MD Unknown Active Spouse/Significant Other  gabapentin (NEURONTIN) 100 MG capsule [id number] No Take 100 mg by mouth at bedtime as needed (pain). [provider] Unknown Active Spouse/Significant Other  hydrALAZINE  (APRESOLINE ) 50 MG tablet [id number] No TAKE (1) TABLET BY MOUTH (  3) TIMES DAILY.  Patient taking differently: Take 50 mg by mouth daily.   Anner Alm ORN, MD  01/16/2024 Active Spouse/Significant Other  isosorbide  mononitrate (IMDUR ) 60 MG 24 hr tablet [id number] No TAKE (1) TABLET BY MOUTH ONCE DAILY. Anner Alm ORN, MD 01/16/2024 Active Spouse/Significant Other  lisinopril  (ZESTRIL ) 40 MG tablet [id number] No TAKE 1/2 TABLET BY MOUTH ONCE DAILY. Anner Alm ORN, MD 01/16/2024 Active Spouse/Significant Other  loperamide  (IMODIUM ) 2 MG capsule [id number] No Take 1 capsule (2 mg total) by mouth 4 (four) times daily as needed for diarrhea or loose stools. Theadore Ozell HERO, MD Unknown Active Spouse/Significant Other  LOTEMAX 0.5 % OINT [id number] No Place 1 application into the left eye daily as needed (irritation, dry eyes). [provider] Unknown Active Spouse/Significant Other  magic mouthwash SOLN [id number] No Take 5 mLs by mouth daily. Suspension contains equal amounts of Maalox Extra Strength, nystatin, and diphenhydramine . [provider] Past Week Active Spouse/Significant Other  magnesium  gluconate (MAGONATE) 500 MG tablet [id number] No Take 500 mg by mouth daily. [provider] 01/16/2024 Active Spouse/Significant Other  meloxicam (MOBIC) 15 MG tablet [id number] No Take 15 mg by mouth daily as needed for pain. [provider] Unknown Active Spouse/Significant Other  Naltrexone HCl, Pain, (NALTREX) 4.5 MG CAPS [id number] No Take 4.5 mg by mouth every evening.  Patient not taking: Reported on 12/16/2023   [provider] Not Taking Active Spouse/Significant Other  OVER THE COUNTER MEDICATION [id number] No Take 2 Capfuls by mouth daily. HistaQuel (Histamine) [provider] 01/16/2024 Active Spouse/Significant Other  OVER THE COUNTER MEDICATION [id number] No Take 1 capsule by mouth daily. Blood Sugar Formula [provider] 01/16/2024 Active Spouse/Significant Other  oxyCODONE  (OXY IR/ROXICODONE ) 5 MG immediate release tablet [id number] No Take 5 mg by mouth every 4 (four) hours as needed for moderate pain  (pain score 4-6). [provider] Unknown Active Spouse/Significant Other  prednisoLONE acetate (PRED FORTE) 1 % ophthalmic suspension [id number] No Place 1 drop into both eyes 2 (two) times daily as needed (irritation). [provider] Unknown Active Spouse/Significant Other           Med Note (SATTERFIELD, DARIUS E   Tue Dec 16, 2023  7:37 PM) Still has on hand   predniSONE  (DELTASONE ) 10 MG tablet [id number]  Take 5 tablets (50 mg total) by mouth daily with breakfast for 2 days, THEN 4 tablets (40 mg total) daily with breakfast for 2 days, THEN 3 tablets (30 mg total) daily with breakfast for 2 days, THEN 2 tablets (20 mg total) daily with breakfast for 2 days, THEN 1 tablet (10 mg total) daily with breakfast for 2 days, THEN 0.5 tablets (5 mg total) daily with breakfast for 20 days. Bryn Bernardino NOVAK, MD  Active   spironolactone  (ALDACTONE ) 25 MG tablet [id number] No Take 1 tablet (25 mg total) by mouth daily.  Patient not taking: Reported on 01/18/2024   Anner Alm ORN, MD Not Taking Active Spouse/Significant Other  tamsulosin (FLOMAX) 0.4 MG CAPS capsule [id number]  Take 1 capsule (0.4 mg total) by mouth daily. Bryn Bernardino NOVAK, MD  Active   tobramycin (TOBREX) 0.3 % ophthalmic solution [id number] No Place 1 drop into both eyes daily as needed (for eye irritation). [provider] Unknown Active Spouse/Significant Other  triazolam  (HALCION ) 0.125 MG tablet [id number] No Take 1.5 tablets (0.1875 mg total) by mouth at bedtime. Pt takes 1.5 tabs at bedtime  Patient taking differently: Take 0.25 mg by mouth at bedtime.  Anner Alm ORN, MD 01/16/2024 Active Spouse/Significant Other  valACYclovir (VALTREX) 500 MG tablet [id number] No Take 500 mg by mouth daily. [provider] 01/16/2024 Active Spouse/Significant Other  VITAMIN D  PO [id number] No Take 1 tablet by mouth daily. [provider] 01/16/2024 Active Spouse/Significant Other            Home Care and  Equipment/Supplies: Were Home Health Services Ordered?: NA Any new equipment or medical supplies ordered?: NA  Functional Questionnaire: Do you need assistance with bathing/showering or dressing?: No Do you need assistance with meal preparation?: No Do you need assistance with eating?: No Do you have difficulty maintaining continence: No Do you need assistance with getting out of bed/getting out of a chair/moving?: No Do you have difficulty managing or taking your medications?: No  Follow up appointments reviewed: PCP Follow-up appointment confirmed?: No (The patient states he will call but he is at work so it won't be today) MD Provider Line Number:[phone] Given: No Specialist Hospital Follow-up appointment confirmed?: NA Do you need transportation to your follow-up appointment?: No Do you understand care options if your condition(s) worsen?: Yes-patient verbalized understanding  SDOH Interventions Today    Flowsheet Row Most Recent Value  SDOH Interventions   Food Insecurity Interventions Intervention Not Indicated  Housing Interventions Intervention Not Indicated  Transportation Interventions Intervention Not Indicated  Utilities Interventions Intervention Not Indicated    Medford Balboa, BSN, RN [place]  VBCI - Population Health RN Care Manager [phone]

## 2024-01-23 LAB — CULTURE, BLOOD (ROUTINE X 2)
Culture: NO GROWTH
Culture: NO GROWTH
Special Requests: ADEQUATE
Special Requests: ADEQUATE

## 2024-01-26 DIAGNOSIS — C439 Malignant melanoma of skin, unspecified: Secondary | ICD-10-CM | POA: Diagnosis not present

## 2024-01-27 DIAGNOSIS — B351 Tinea unguium: Secondary | ICD-10-CM | POA: Diagnosis not present

## 2024-01-27 DIAGNOSIS — E1142 Type 2 diabetes mellitus with diabetic polyneuropathy: Secondary | ICD-10-CM | POA: Diagnosis not present

## 2024-01-28 DIAGNOSIS — K1231 Oral mucositis (ulcerative) due to antineoplastic therapy: Secondary | ICD-10-CM | POA: Diagnosis not present

## 2024-01-28 DIAGNOSIS — Z9289 Personal history of other medical treatment: Secondary | ICD-10-CM | POA: Diagnosis not present

## 2024-01-28 DIAGNOSIS — T45AX5D Adverse effect of immune checkpoint inhibitors and immunostimulant drugs, subsequent encounter: Secondary | ICD-10-CM | POA: Diagnosis not present

## 2024-01-28 DIAGNOSIS — C4330 Malignant melanoma of unspecified part of face: Secondary | ICD-10-CM | POA: Diagnosis not present

## 2024-01-28 DIAGNOSIS — C439 Malignant melanoma of skin, unspecified: Secondary | ICD-10-CM | POA: Diagnosis not present

## 2024-02-02 DIAGNOSIS — H353221 Exudative age-related macular degeneration, left eye, with active choroidal neovascularization: Secondary | ICD-10-CM | POA: Diagnosis not present

## 2024-02-03 DIAGNOSIS — I10 Essential (primary) hypertension: Secondary | ICD-10-CM | POA: Diagnosis not present

## 2024-02-03 DIAGNOSIS — I700 Atherosclerosis of aorta: Secondary | ICD-10-CM | POA: Diagnosis not present

## 2024-02-03 DIAGNOSIS — Z6832 Body mass index (BMI) 32.0-32.9, adult: Secondary | ICD-10-CM | POA: Diagnosis not present

## 2024-02-03 DIAGNOSIS — Z0001 Encounter for general adult medical examination with abnormal findings: Secondary | ICD-10-CM | POA: Diagnosis not present

## 2024-02-03 DIAGNOSIS — M329 Systemic lupus erythematosus, unspecified: Secondary | ICD-10-CM | POA: Diagnosis not present

## 2024-02-03 DIAGNOSIS — E6609 Other obesity due to excess calories: Secondary | ICD-10-CM | POA: Diagnosis not present

## 2024-02-03 DIAGNOSIS — E1129 Type 2 diabetes mellitus with other diabetic kidney complication: Secondary | ICD-10-CM | POA: Diagnosis not present

## 2024-02-03 DIAGNOSIS — M1991 Primary osteoarthritis, unspecified site: Secondary | ICD-10-CM | POA: Diagnosis not present

## 2024-02-03 DIAGNOSIS — N182 Chronic kidney disease, stage 2 (mild): Secondary | ICD-10-CM | POA: Diagnosis not present

## 2024-02-03 DIAGNOSIS — R5383 Other fatigue: Secondary | ICD-10-CM | POA: Diagnosis not present

## 2024-02-03 DIAGNOSIS — L4053 Psoriatic spondylitis: Secondary | ICD-10-CM | POA: Diagnosis not present

## 2024-02-03 DIAGNOSIS — C439 Malignant melanoma of skin, unspecified: Secondary | ICD-10-CM | POA: Diagnosis not present

## 2024-02-05 DIAGNOSIS — L578 Other skin changes due to chronic exposure to nonionizing radiation: Secondary | ICD-10-CM | POA: Diagnosis not present

## 2024-02-05 DIAGNOSIS — L814 Other melanin hyperpigmentation: Secondary | ICD-10-CM | POA: Diagnosis not present

## 2024-02-05 DIAGNOSIS — C439 Malignant melanoma of skin, unspecified: Secondary | ICD-10-CM | POA: Diagnosis not present

## 2024-02-05 DIAGNOSIS — D225 Melanocytic nevi of trunk: Secondary | ICD-10-CM | POA: Diagnosis not present

## 2024-02-05 DIAGNOSIS — D485 Neoplasm of uncertain behavior of skin: Secondary | ICD-10-CM | POA: Diagnosis not present

## 2024-02-16 DIAGNOSIS — C439 Malignant melanoma of skin, unspecified: Secondary | ICD-10-CM | POA: Diagnosis not present

## 2024-02-16 DIAGNOSIS — C7951 Secondary malignant neoplasm of bone: Secondary | ICD-10-CM | POA: Diagnosis not present

## 2024-02-16 DIAGNOSIS — C7931 Secondary malignant neoplasm of brain: Secondary | ICD-10-CM | POA: Diagnosis not present

## 2024-02-17 DIAGNOSIS — K117 Disturbances of salivary secretion: Secondary | ICD-10-CM | POA: Diagnosis not present

## 2024-02-17 DIAGNOSIS — C439 Malignant melanoma of skin, unspecified: Secondary | ICD-10-CM | POA: Diagnosis not present

## 2024-02-17 DIAGNOSIS — K1231 Oral mucositis (ulcerative) due to antineoplastic therapy: Secondary | ICD-10-CM | POA: Diagnosis not present

## 2024-02-17 DIAGNOSIS — B370 Candidal stomatitis: Secondary | ICD-10-CM | POA: Diagnosis not present

## 2024-02-23 DIAGNOSIS — C7931 Secondary malignant neoplasm of brain: Secondary | ICD-10-CM | POA: Diagnosis not present

## 2024-02-24 DIAGNOSIS — C439 Malignant melanoma of skin, unspecified: Secondary | ICD-10-CM | POA: Diagnosis not present

## 2024-02-24 DIAGNOSIS — C4330 Malignant melanoma of unspecified part of face: Secondary | ICD-10-CM | POA: Diagnosis not present

## 2024-02-24 DIAGNOSIS — L299 Pruritus, unspecified: Secondary | ICD-10-CM | POA: Diagnosis not present

## 2024-02-24 DIAGNOSIS — L4050 Arthropathic psoriasis, unspecified: Secondary | ICD-10-CM | POA: Diagnosis not present

## 2024-02-24 DIAGNOSIS — E119 Type 2 diabetes mellitus without complications: Secondary | ICD-10-CM | POA: Diagnosis not present

## 2024-02-24 DIAGNOSIS — C7931 Secondary malignant neoplasm of brain: Secondary | ICD-10-CM | POA: Diagnosis not present

## 2024-02-24 DIAGNOSIS — Z7952 Long term (current) use of systemic steroids: Secondary | ICD-10-CM | POA: Diagnosis not present

## 2024-02-24 DIAGNOSIS — F5102 Adjustment insomnia: Secondary | ICD-10-CM | POA: Diagnosis not present

## 2024-02-24 DIAGNOSIS — K1231 Oral mucositis (ulcerative) due to antineoplastic therapy: Secondary | ICD-10-CM | POA: Diagnosis not present

## 2024-02-24 DIAGNOSIS — Z9289 Personal history of other medical treatment: Secondary | ICD-10-CM | POA: Diagnosis not present

## 2024-02-27 DIAGNOSIS — C7931 Secondary malignant neoplasm of brain: Secondary | ICD-10-CM | POA: Diagnosis not present

## 2024-02-27 DIAGNOSIS — C439 Malignant melanoma of skin, unspecified: Secondary | ICD-10-CM | POA: Diagnosis not present

## 2024-03-01 DIAGNOSIS — C7931 Secondary malignant neoplasm of brain: Secondary | ICD-10-CM | POA: Diagnosis not present

## 2024-03-01 DIAGNOSIS — C439 Malignant melanoma of skin, unspecified: Secondary | ICD-10-CM | POA: Diagnosis not present

## 2024-03-02 DIAGNOSIS — C7931 Secondary malignant neoplasm of brain: Secondary | ICD-10-CM | POA: Diagnosis not present

## 2024-03-03 DIAGNOSIS — C7931 Secondary malignant neoplasm of brain: Secondary | ICD-10-CM | POA: Diagnosis not present

## 2024-03-04 DIAGNOSIS — C7931 Secondary malignant neoplasm of brain: Secondary | ICD-10-CM | POA: Diagnosis not present

## 2024-03-05 DIAGNOSIS — C7931 Secondary malignant neoplasm of brain: Secondary | ICD-10-CM | POA: Diagnosis not present

## 2024-03-22 DIAGNOSIS — H353221 Exudative age-related macular degeneration, left eye, with active choroidal neovascularization: Secondary | ICD-10-CM | POA: Diagnosis not present

## 2024-03-25 DIAGNOSIS — Z9289 Personal history of other medical treatment: Secondary | ICD-10-CM | POA: Diagnosis not present

## 2024-03-25 DIAGNOSIS — C4330 Malignant melanoma of unspecified part of face: Secondary | ICD-10-CM | POA: Diagnosis not present

## 2024-03-30 DIAGNOSIS — C4330 Malignant melanoma of unspecified part of face: Secondary | ICD-10-CM | POA: Diagnosis not present

## 2024-04-06 DIAGNOSIS — E1142 Type 2 diabetes mellitus with diabetic polyneuropathy: Secondary | ICD-10-CM | POA: Diagnosis not present

## 2024-04-06 DIAGNOSIS — B351 Tinea unguium: Secondary | ICD-10-CM | POA: Diagnosis not present

## 2024-04-07 ENCOUNTER — Telehealth: Payer: Self-pay | Admitting: Cardiology

## 2024-04-07 NOTE — Telephone Encounter (Signed)
 Message re: non-cardiac med refill and new PCP recommendation routed to Dr. Anner

## 2024-04-07 NOTE — Telephone Encounter (Signed)
 Pt c/o medication issue:  1. Name of Medication: triazolam  (HALCION ) 0.125 MG tablet   2. How are you currently taking this medication (dosage and times per day)? Take 1.5 tablets (0.1875 mg total) by mouth at bedtime. Pt takes 1.5 tabs at bedtimePatient taking differently: Take 0.25 mg by mouth at bedtime.   3. Are you having a reaction (difficulty breathing--STAT)? No  4. What is your medication issue? Patient is calling because his PCP is leaving. Patient is concerned about who will take over this prescription. Patient would like to know if Dr. Anner will prescribe the medication until he finds a new PCP. Patient would like to know if Dr. Anner has recommendations on a new PCP. Please advise.

## 2024-04-10 NOTE — Telephone Encounter (Signed)
 I think this was a medicine that Dr. Morgan started long ago.  Nobody else will write it so we just follow-up for him.

## 2024-04-15 NOTE — Telephone Encounter (Signed)
 Patient is aware  that Dr Anner will prescribe medications - Halcion   0.125 mg   Patient is aware that Dr Anner will have sign Rx before he can pick up . He is aware will call next week once ready.

## 2024-04-19 MED ORDER — TRIAZOLAM 0.125 MG PO TABS: 0.1875 mg | ORAL_TABLET | Freq: Every day | ORAL | 5 refills | Status: AC

## 2024-04-19 MED ORDER — TRIAZOLAM 0.125 MG PO TABS: 0.1875 mg | ORAL_TABLET | Freq: Every day | ORAL | 5 refills | Status: DC

## 2024-04-19 NOTE — Telephone Encounter (Signed)
 Left message on answer machine  - you may stop by the office and pick up the prescription from the coumadin desk on the 5th floor.

## 2024-04-28 ENCOUNTER — Encounter (HOSPITAL_COMMUNITY): Payer: Self-pay

## 2024-04-28 ENCOUNTER — Emergency Department (HOSPITAL_COMMUNITY)
Admission: EM | Admit: 2024-04-28 | Discharge: 2024-04-28 | Disposition: A | Discharge status: Home / Self Care | Attending: Emergency Medicine | Admitting: Emergency Medicine

## 2024-04-28 ENCOUNTER — Emergency Department (HOSPITAL_COMMUNITY)

## 2024-04-28 ENCOUNTER — Other Ambulatory Visit: Payer: Self-pay

## 2024-04-28 DIAGNOSIS — R112 Nausea with vomiting, unspecified: Secondary | ICD-10-CM | POA: Insufficient documentation

## 2024-04-28 DIAGNOSIS — R590 Localized enlarged lymph nodes: Secondary | ICD-10-CM | POA: Insufficient documentation

## 2024-04-28 DIAGNOSIS — I6782 Cerebral ischemia: Secondary | ICD-10-CM | POA: Diagnosis not present

## 2024-04-28 DIAGNOSIS — G936 Cerebral edema: Secondary | ICD-10-CM | POA: Insufficient documentation

## 2024-04-28 DIAGNOSIS — R1110 Vomiting, unspecified: Secondary | ICD-10-CM | POA: Insufficient documentation

## 2024-04-28 DIAGNOSIS — R531 Weakness: Secondary | ICD-10-CM | POA: Insufficient documentation

## 2024-04-28 DIAGNOSIS — Z85820 Personal history of malignant melanoma of skin: Secondary | ICD-10-CM | POA: Diagnosis not present

## 2024-04-28 DIAGNOSIS — Z7952 Long term (current) use of systemic steroids: Secondary | ICD-10-CM | POA: Diagnosis not present

## 2024-04-28 DIAGNOSIS — Z8551 Personal history of malignant neoplasm of bladder: Secondary | ICD-10-CM | POA: Insufficient documentation

## 2024-04-28 DIAGNOSIS — R4182 Altered mental status, unspecified: Secondary | ICD-10-CM | POA: Diagnosis not present

## 2024-04-28 LAB — COMPREHENSIVE METABOLIC PANEL WITH GFR
ALT: 17 U/L (ref 0–44)
AST: 16 U/L (ref 15–41)
Albumin: 4.7 g/dL (ref 3.5–5.0)
Alkaline Phosphatase: 83 U/L (ref 38–126)
Anion gap: 14 (ref 5–15)
BUN: 17 mg/dL (ref 8–23)
CO2: 25 mmol/L (ref 22–32)
Calcium: 9.9 mg/dL (ref 8.9–10.3)
Chloride: 104 mmol/L (ref 98–111)
Creatinine, Ser: 0.84 mg/dL (ref 0.61–1.24)
GFR, Estimated: >60 mL/min (ref >60)
Glucose, Bld: 148 mg/dL — ABNORMAL HIGH (ref 70–99)
Potassium: 3.4 mmol/L — ABNORMAL LOW (ref 3.5–5.1)
Sodium: 143 mmol/L (ref 135–145)
Total Bilirubin: 0.7 mg/dL (ref 0.0–1.2)
Total Protein: 7.2 g/dL (ref 6.5–8.1)

## 2024-04-28 LAB — URINALYSIS, ROUTINE W REFLEX MICROSCOPIC
Bacteria, UA: NONE SEEN
Bilirubin Urine: NEGATIVE
Glucose, UA: >=500 mg/dL — AB
Hgb urine dipstick: NEGATIVE
Ketones, ur: 20 mg/dL — AB
Leukocytes,Ua: NEGATIVE
Nitrite: NEGATIVE
Protein, ur: 30 mg/dL — AB
Specific Gravity, Urine: 1.015 (ref 1.005–1.030)
pH: 6 (ref 5.0–8.0)

## 2024-04-28 LAB — CBC WITH DIFFERENTIAL/PLATELET
Abs Immature Granulocytes: 0.02 K/uL (ref 0.00–0.07)
Basophils Absolute: 0 K/uL (ref 0.0–0.1)
Basophils Relative: 0 %
Eosinophils Absolute: 0 K/uL (ref 0.0–0.5)
Eosinophils Relative: 0 %
HCT: 44.9 % (ref 39.0–52.0)
Hemoglobin: 14.6 g/dL (ref 13.0–17.0)
Immature Granulocytes: 0 %
Lymphocytes Relative: 9 %
Lymphs Abs: 0.6 K/uL — ABNORMAL LOW (ref 0.7–4.0)
MCH: 28.7 pg (ref 26.0–34.0)
MCHC: 32.5 g/dL (ref 30.0–36.0)
MCV: 88.4 fL (ref 80.0–100.0)
Monocytes Absolute: 0.7 K/uL (ref 0.1–1.0)
Monocytes Relative: 10 %
Neutro Abs: 5.9 K/uL (ref 1.7–7.7)
Neutrophils Relative %: 81 %
Platelets: 192 K/uL (ref 150–400)
RBC: 5.08 MIL/uL (ref 4.22–5.81)
RDW: 14.7 % (ref 11.5–15.5)
WBC: 7.3 K/uL (ref 4.0–10.5)
nRBC: 0 % (ref 0.0–0.2)

## 2024-04-28 LAB — RESP PANEL BY RT-PCR (RSV, FLU A&B, COVID)  RVPGX2
Influenza A by PCR: NEGATIVE
Influenza B by PCR: NEGATIVE
Resp Syncytial Virus by PCR: NEGATIVE
SARS Coronavirus 2 by RT PCR: NEGATIVE

## 2024-04-28 MED ORDER — SODIUM CHLORIDE 0.9 % IV BOLUS
1000.0000 mL | Freq: Once | INTRAVENOUS | Status: AC
Start: 2024-04-28 — End: 2024-04-28
  Administered 2024-04-28: 1000 mL via INTRAVENOUS

## 2024-04-28 MED ORDER — ONDANSETRON HCL 4 MG/2ML IJ SOLN
4.0000 mg | Freq: Once | INTRAMUSCULAR | Status: AC
  Administered 2024-04-28: 4 mg via INTRAVENOUS
  Filled 2024-04-28: qty 2

## 2024-04-28 MED ORDER — ONDANSETRON 4 MG PO TBDP: ORAL_TABLET | ORAL | 0 refills | Status: DC

## 2024-04-28 MED ORDER — IOHEXOL 300 MG/ML  SOLN
75.0000 mL | Freq: Once | INTRAMUSCULAR | Status: AC | PRN
  Administered 2024-04-28: 75 mL via INTRAVENOUS

## 2024-04-28 MED ORDER — ONDANSETRON 4 MG PO TBDP: ORAL_TABLET | ORAL | 1 refills | Status: AC

## 2024-04-28 MED ORDER — LORAZEPAM 2 MG/ML IJ SOLN
0.5000 mg | Freq: Once | INTRAMUSCULAR | Status: AC
  Administered 2024-04-28: 0.5 mg via INTRAVENOUS
  Filled 2024-04-28: qty 1

## 2024-04-28 MED ORDER — GADOBUTROL 1 MMOL/ML IV SOLN
10.0000 mL | Freq: Once | INTRAVENOUS | Status: AC | PRN
  Administered 2024-04-28: 10 mL via INTRAVENOUS

## 2024-04-28 NOTE — ED Provider Notes (Signed)
 [place] EMERGENCY DEPARTMENT AT [facility] Provider Note   CSN: [id number] Arrival date & time: 04/28/24  9344     Patient presents with: Weakness and Altered Mental Status   Arthur Lutz is a 83 y.o. male.   Patient has a history of metastatic cancer to the brain.  He was started on Decadron  yesterday and presents today with more confusion and vomiting  The history is provided by the patient and medical records. No language interpreter was used.  Altered Mental Status Presenting symptoms: behavior changes and confusion   Severity:  Moderate Most recent episode:  More than 2 days ago Episode history:  Continuous Timing:  Intermittent Progression:  Partially resolved Chronicity:  Recurrent Context: not alcohol use   Associated symptoms: vomiting   Associated symptoms: no abdominal pain, no hallucinations, no headaches, no rash and no seizures        Prior to Admission medications   Medication Sig Start Date End Date Taking? Authorizing Provider  acetaminophen  (TYLENOL ) 650 MG CR tablet Take 1,300 mg by mouth every 8 (eight) hours as needed for pain.   Yes [provider]  amLODipine  (NORVASC ) 10 MG tablet TAKE (1) TABLET BY MOUTH ONCE DAILY. 12/29/23  Yes Anner Alm LELON, MD  atorvastatin  (LIPITOR) 40 MG tablet Take 1 tablet (40 mg total) by mouth daily. Patient taking differently: Take 40 mg by mouth every evening. 12/02/23 04/28/24 Yes Anner Alm LELON, MD  azelastine  (ASTELIN ) 0.1 % nasal spray Place 1 spray into the nose daily as needed (allergies).   Yes [provider]  clobetasol (TEMOVATE) 0.05 % external solution Apply 1 application topically daily as needed (irritation). 03/01/19  Yes [provider]  Coenzyme Q10 (COQ10) 100 MG CAPS Take 100 mg by mouth daily.   Yes [provider]  cyclobenzaprine  (FLEXERIL ) 10 MG tablet Take 10 mg by mouth 3 (three) times daily. 03/15/24  Yes [provider]  dexamethasone   (DECADRON ) 1 MG tablet Take 1 mg by mouth See admin instructions. As directed Take 4 tablets by mouth daily with breakfast for 1 day, THEN 2 tabs daily with breakfast for 10 days, THEN 1 tab daily with breakfast for 14 days.[SOLD:04/26/2024] 04/26/24 05/21/24 Yes [provider]  EPINEPHrine  0.3 mg/0.3 mL IJ SOAJ injection Inject 0.3 mg into the muscle as needed for anaphylaxis. 12/25/18  Yes [provider]  ezetimibe  (ZETIA ) 10 MG tablet Take 10 mg by mouth daily.   Yes [provider]  gabapentin  (NEURONTIN ) 100 MG capsule Take 100 mg by mouth at bedtime as needed (pain).   Yes [provider]  hydrOXYzine (ATARAX) 25 MG tablet Take 25-50 mg by mouth at bedtime. 03/15/24  Yes [provider]  isosorbide  mononitrate (IMDUR ) 60 MG 24 hr tablet TAKE (1) TABLET BY MOUTH ONCE DAILY. 12/08/23  Yes Anner Alm LELON, MD  lisinopril  (ZESTRIL ) 40 MG tablet TAKE 1/2 TABLET BY MOUTH ONCE DAILY. Patient taking differently: Take 40 mg by mouth daily. 07/10/22  Yes Anner Alm LELON, MD  loperamide  (IMODIUM ) 2 MG capsule Take 1 capsule (2 mg total) by mouth 4 (four) times daily as needed for diarrhea or loose stools. 03/30/19  Yes Theadore Ozell HERO, MD  magnesium  gluconate (MAGONATE) 500 MG tablet Take 500 mg by mouth daily.   Yes [provider]  meloxicam (MOBIC) 15 MG tablet Take 15 mg by mouth daily as needed for pain.   Yes [provider]  OVER THE COUNTER MEDICATION Take 2  Capfuls by mouth daily. HistaQuel (Histamine)   Yes [provider]  OVER THE COUNTER MEDICATION Take 1 capsule by mouth daily. Blood Sugar Formula   Yes [provider]  oxyCODONE  (OXY IR/ROXICODONE ) 5 MG immediate release tablet Take 5 mg by mouth every 4 (four) hours as needed for moderate pain (pain score 4-6). 12/09/23  Yes [provider]  prednisoLONE acetate (PRED FORTE) 1 % ophthalmic suspension Place 1 drop into both eyes 2 (two) times daily as  needed (irritation). 03/08/21  Yes [provider]  tobramycin (TOBREX) 0.3 % ophthalmic solution Place 1 drop into both eyes daily as needed (for eye irritation). 02/20/21  Yes [provider]  triazolam  (HALCION ) 0.125 MG tablet Take 1.5 tablets (0.1875 mg total) by mouth at bedtime. Pt takes 1.5 tabs at bedtime 04/19/24  Yes Anner Alm ORN, MD  VITAMIN D  PO Take 1 tablet by mouth daily.   Yes [provider]  ondansetron  (ZOFRAN -ODT) 4 MG disintegrating tablet 4mg  ODT q4 hours prn nausea/vomit 04/28/24   Yaretzy Olazabal, MD    Allergies: Apremilast, Aspirin , Leflunomide, Methotrexate, Rosuvastatin calcium , and Other    Review of Systems  Constitutional:  Negative for appetite change and fatigue.  HENT:  Negative for congestion, ear discharge and sinus pressure.   Eyes:  Negative for discharge.  Respiratory:  Negative for cough.   Cardiovascular:  Negative for chest pain.  Gastrointestinal:  Positive for vomiting. Negative for abdominal pain and diarrhea.  Genitourinary:  Negative for frequency and hematuria.  Musculoskeletal:  Negative for back pain.  Skin:  Negative for rash.  Neurological:  Negative for seizures and headaches.  Psychiatric/Behavioral:  Positive for confusion. Negative for hallucinations.     Updated Vital Signs BP (!) 148/106   Pulse 94   Temp 98.2 F (36.8 C) (Oral)   Resp 17   Ht 6' (1.829 m)   Wt 117.9 kg   SpO2 95%   BMI 35.26 kg/m   Physical Exam Vitals and nursing note reviewed.  Constitutional:      Appearance: He is well-developed.  HENT:     Head: Normocephalic.     Nose: Nose normal.  Eyes:     General: No scleral icterus.    Conjunctiva/sclera: Conjunctivae normal.  Neck:     Thyroid : No thyromegaly.  Cardiovascular:     Rate and Rhythm: Normal rate and regular rhythm.     Heart sounds: No murmur heard.    No friction rub. No gallop.  Pulmonary:     Breath sounds: No stridor. No wheezing or rales.  Chest:      Chest wall: No tenderness.  Abdominal:     General: There is no distension.     Tenderness: There is no abdominal tenderness. There is no rebound.  Musculoskeletal:        General: Normal range of motion.     Cervical back: Neck supple.  Lymphadenopathy:     Cervical: No cervical adenopathy.  Skin:    Findings: No erythema or rash.  Neurological:     Mental Status: He is alert.     Motor: No abnormal muscle tone.     Coordination: Coordination normal.     Comments: Oriented to person only  Psychiatric:        Behavior: Behavior normal.     (all labs ordered are listed, but only abnormal results are displayed) Labs Reviewed  CBC WITH DIFFERENTIAL/PLATELET - Abnormal; Notable for the following components:  Result Value   Lymphs Abs 0.6 (*)    All other components within normal limits  COMPREHENSIVE METABOLIC PANEL WITH GFR - Abnormal; Notable for the following components:   Potassium 3.4 (*)    Glucose, Bld 148 (*)    All other components within normal limits  URINALYSIS, ROUTINE W REFLEX MICROSCOPIC - Abnormal; Notable for the following components:   APPearance HAZY (*)    Glucose, UA >=500 (*)    Ketones, ur 20 (*)    Protein, ur 30 (*)    All other components within normal limits  RESP PANEL BY RT-PCR (RSV, FLU A&B, COVID)  [id number]    EKG: None  Radiology: MR Brain W and Wo Contrast Result Date: 04/28/2024 CLINICAL DATA:  Provided history: Brain metastases, assess treatment response. EXAM: MRI HEAD WITHOUT AND WITH CONTRAST TECHNIQUE: Multiplanar, multiecho pulse sequences of the brain and surrounding structures were obtained without and with intravenous contrast. CONTRAST:  10mL GADAVIST  GADOBUTROL  1 MMOL/ML IV SOLN COMPARISON:  Prior head CT examinations 04/28/2024 and earlier. FINDINGS: Brain: No age-advanced or lobar predominant cerebral atrophy. 3.9 x 3.1 x 3.7 cm enhancing and centrally necrotic left parietooccipital parenchymal mass. Stippled  susceptibility-weighted signal loss within this lesion consistent with non-acute blood products. Immediately adjacent 7 mm enhancing lesion within the left parietal lobe (for instance as seen on series 19, image 5). Prominent vasogenic edema surrounding these lesions with partial effacement of the left lateral ventricle and 2-3 mm rightward midline shift. Moderate multifocal T2 FLAIR hyperintense signal abnormality elsewhere within the cerebral white matter, nonspecific but compatible with chronic small vessel ischemic disease. Chronic microhemorrhages along the left insula and within the inferior left cerebellar hemisphere. There is no acute infarct. No extra-axial fluid collection. Vascular: Maintained flow voids within the proximal large arterial vessels. Skull and upper cervical spine: No focal worrisome marrow lesion. C3-C4 vertebral ankylosis. Sinuses/Orbits: No mass or acute finding within the imaged orbits. Prior bilateral ocular lens replacement. Minimal mucosal thickening within the left maxillary sinus. IMPRESSION: 1. 3.9 x 3.7 cm enhancing and centrally necrotic left parietooccipital parenchymal mass. Immediately adjacent 7 mm enhancing lesion within the left parietal lobe. Given the patient's history of bladder cancer and melanoma, these lesions are favored to reflect metastases. However, a high-grade primary CNS neoplasm (such as glioblastoma multiforme) could also have this imaging appearance. Prominent vasogenic edema surrounding the lesions. Mass effect with partial effacement of left lateral ventricle and 2-3 mm rightward midline shift. 2. Background moderate cerebral white matter chronic small vessel ischemic disease. Electronically Signed   By: Rockey Childs D.O.   On: 04/28/2024 13:04   CT Head W or Wo Contrast Result Date: 04/28/2024 CLINICAL DATA:  83 year old male with headache, increased confusion. History of cancer. Per [facility] everywhere - Malignant Melanoma. EXAM: CT HEAD WITHOUT  AND WITH CONTRAST TECHNIQUE: Contiguous axial images were obtained from the base of the skull through the vertex without and with intravenous contrast. RADIATION DOSE REDUCTION: This exam was performed according to the departmental dose-optimization program which includes automated exposure control, adjustment of the mA and/or kV according to patient size and/or use of iterative reconstruction technique. CONTRAST:  75mL OMNIPAQUE  IOHEXOL  300 MG/ML  SOLN COMPARISON:  Head CT 01/18/2024. FINDINGS: Brain: Heterogeneously enhancing rounded and masslike area posterior left hemisphere at the junction of the lateral inferior parietal and occipital lobes is approximately 3.2 cm (series 5, image 52 and series 3, image 14) with confluent surrounding white matter hypodensity compatible with vasogenic edema. Regional  mass effect, most pronounced on the posterior left lateral ventricle. Trace rightward midline shift. Basilar cisterns remain patent. No other convincing vasogenic edema. No other convincing abnormal intracranial enhancement. No superimposed ventriculomegaly, intracranial hemorrhage, or evidence of cortically based acute infarction. Patchy and confluent periventricular white matter hypodensity elsewhere appears chronic. Vascular: Calcified atherosclerosis at the skull base. Major intracranial vascular structures appear to be enhancing as expected. Skull: Appears stable and intact. No suspicious osseous lesion identified. Sinuses/Orbits: Visualized paranasal sinuses and mastoids are stable and well aerated. Other: No acute orbit or scalp soft tissue finding. IMPRESSION: 1. Solitary by CT roughly 3.2 cm Left posterior hemisphere heterogeneously enhancing mass, most likely brain metastasis in this setting. Abundant regional vasogenic edema. Associated regional mass effect with trace rightward midline shift. 2. Brain MRI without and with contrast if feasible would provide the highest sensitivity and specificity for  cerebral metastases. Electronically Signed   By: VEAR Hurst M.D.   On: 04/28/2024 09:55     Procedures   Medications Ordered in the ED  ondansetron  (ZOFRAN ) injection 4 mg (4 mg Intravenous Given 04/28/24 0751)  sodium chloride  0.9 % bolus 1,000 mL (0 mLs Intravenous Stopped 04/28/24 1117)  iohexol  (OMNIPAQUE ) 300 MG/ML solution 75 mL (75 mLs Intravenous Contrast Given 04/28/24 0922)  LORazepam (ATIVAN) injection 0.5 mg (0.5 mg Intravenous Given 04/28/24 1110)  gadobutrol  (GADAVIST ) 1 MMOL/ML injection 10 mL (10 mLs Intravenous Contrast Given 04/28/24 1221)  I spoke with his oncologist and they recommend discharging the patient with Zofran  and they will contact him about the additional treatment after reviewing the MRI.  They want to continue the Decadron  and start Zofran    CRITICAL CARE Performed by: Fairy Sermon Total critical care time: 65 minutes Critical care time was exclusive of separately billable procedures and treating other patients. Critical care was necessary to treat or prevent imminent or life-threatening deterioration. Critical care was time spent personally by me on the following activities: development of treatment plan with patient and/or surrogate as well as nursing, discussions with consultants, evaluation of patient's response to treatment, examination of patient, obtaining history from patient or surrogate, ordering and performing treatments and interventions, ordering and review of laboratory studies, ordering and review of radiographic studies, pulse oximetry and re-evaluation of patient's condition.                                   Medical Decision Making Amount and/or Complexity of Data Reviewed Labs: ordered. Radiology: ordered.  Risk Prescription drug management.   Metastatic cancer to the brain with vomiting and altered mental status     Final diagnoses:  Nausea and vomiting, unspecified vomiting type    ED Discharge Orders          Ordered     ondansetron  (ZOFRAN -ODT) 4 MG disintegrating tablet  Status:  Discontinued        04/28/24 1404    ondansetron  (ZOFRAN -ODT) 4 MG disintegrating tablet        04/28/24 1405               Sermon Fairy, MD 04/28/24 1726

## 2024-04-28 NOTE — ED Triage Notes (Signed)
 Patient come in POV from home for complaint of increased confusion, was put on a steroid HX Cancer patient had radiation done two months ago being followed by [facility]. Stated started medication prior to lunch, family noticed increased confusion after lunch, Patient informed family has had a headache and weakness.

## 2024-04-28 NOTE — Discharge Instructions (Addendum)
 Your doctor will contact you after she reviews the MRI with other physicians.  If you do not hear from her by Monday then you should call their office.  Continue taking the Decadron 

## 2024-04-29 NOTE — Telephone Encounter (Signed)
 Left message for patient advising that written prescription has been left at the office for them to come by and pick up.

## 2024-04-29 NOTE — Telephone Encounter (Signed)
 Wife Nadia) called to follow-up on getting patient prescription for triazolam  (HALCION ) 0.125 MG tablet sent to [facility] - [place], [place] - [id number]  Cindie noted patient will be out of medication in 2 days.   Wife wants a call back to confirm.

## 2024-04-30 ENCOUNTER — Other Ambulatory Visit (HOSPITAL_COMMUNITY): Payer: Self-pay

## 2024-05-05 ENCOUNTER — Emergency Department (HOSPITAL_COMMUNITY)
Admission: EM | Admit: 2024-05-05 | Discharge: 2024-05-05 | Disposition: A | Discharge status: Home / Self Care | Attending: Emergency Medicine | Admitting: Emergency Medicine

## 2024-05-05 ENCOUNTER — Other Ambulatory Visit: Payer: Self-pay

## 2024-05-05 ENCOUNTER — Encounter (HOSPITAL_COMMUNITY): Payer: Self-pay

## 2024-05-05 DIAGNOSIS — R1030 Lower abdominal pain, unspecified: Secondary | ICD-10-CM | POA: Diagnosis present

## 2024-05-05 DIAGNOSIS — R339 Retention of urine, unspecified: Secondary | ICD-10-CM | POA: Diagnosis not present

## 2024-05-05 LAB — COMPREHENSIVE METABOLIC PANEL WITH GFR
ALT: 18 U/L (ref 0–44)
AST: 18 U/L (ref 15–41)
Albumin: 4.7 g/dL (ref 3.5–5.0)
Alkaline Phosphatase: 89 U/L (ref 38–126)
Anion gap: 17 — ABNORMAL HIGH (ref 5–15)
BUN: 13 mg/dL (ref 8–23)
CO2: 26 mmol/L (ref 22–32)
Calcium: 10.2 mg/dL (ref 8.9–10.3)
Chloride: 103 mmol/L (ref 98–111)
Creatinine, Ser: 1.01 mg/dL (ref 0.61–1.24)
GFR, Estimated: >60 mL/min (ref >60)
Glucose, Bld: 247 mg/dL — ABNORMAL HIGH (ref 70–99)
Potassium: 3.5 mmol/L (ref 3.5–5.1)
Sodium: 145 mmol/L (ref 135–145)
Total Bilirubin: 0.8 mg/dL (ref 0.0–1.2)
Total Protein: 7.5 g/dL (ref 6.5–8.1)

## 2024-05-05 LAB — URINALYSIS, ROUTINE W REFLEX MICROSCOPIC
Bacteria, UA: NONE SEEN
Bilirubin Urine: NEGATIVE
Glucose, UA: >=500 mg/dL — AB
Ketones, ur: NEGATIVE mg/dL
Leukocytes,Ua: NEGATIVE
Nitrite: NEGATIVE
Protein, ur: 30 mg/dL — AB
Specific Gravity, Urine: 1.005 (ref 1.005–1.030)
pH: 7 (ref 5.0–8.0)

## 2024-05-05 LAB — CBC
HCT: 49.4 % (ref 39.0–52.0)
Hemoglobin: 15.8 g/dL (ref 13.0–17.0)
MCH: 28.2 pg (ref 26.0–34.0)
MCHC: 32 g/dL (ref 30.0–36.0)
MCV: 88.2 fL (ref 80.0–100.0)
Platelets: 255 K/uL (ref 150–400)
RBC: 5.6 MIL/uL (ref 4.22–5.81)
RDW: 14.8 % (ref 11.5–15.5)
WBC: 11.7 K/uL — ABNORMAL HIGH (ref 4.0–10.5)
nRBC: 0 % (ref 0.0–0.2)

## 2024-05-05 LAB — LIPASE, BLOOD: Lipase: 35 U/L (ref 11–51)

## 2024-05-05 MED ORDER — FENTANYL CITRATE (PF) 100 MCG/2ML IJ SOLN
50.0000 ug | Freq: Once | INTRAMUSCULAR | Status: AC
  Administered 2024-05-05: 50 ug via INTRAVENOUS
  Filled 2024-05-05: qty 2

## 2024-05-05 NOTE — ED Triage Notes (Signed)
 Pt reports left side abd and groin pain intermittently x 1 week. Pain is worse today and he is having trouble urinating.

## 2024-05-05 NOTE — ED Notes (Signed)
Dr Trifan at bedside  

## 2024-05-05 NOTE — Discharge Instructions (Signed)
 Please follow up with your urology in 1-2 weeks to recheck your bladder and foley.  Your catheter should be removed or replaced in no longer than 4 weeks.

## 2024-05-05 NOTE — ED Notes (Signed)
 Pt c/o difficulty urinating since noon, has enlarged prostate. Pt having flank and back pain intermittently throughout the week

## 2024-05-05 NOTE — ED Provider Notes (Signed)
 [place] EMERGENCY DEPARTMENT AT [facility] Provider Note   CSN: [id number] Arrival date & time: 05/05/24  1849     Patient presents with: Groin Pain   Arthur Lutz is a 83 y.o. male with history of BPH presenting to ED with worsening abdominal pain, suprapubic pain, that began earlier today.  Patient reports he has chronic difficulty with urination but is only had a dribble out today.  He says he feels he has to urinate.  He is having now flank pain and back pain.  Although he had the symptoms earlier in the week  {Add pertinent medical, surgical, social history, OB history to HPI:32947} HPI     Prior to Admission medications   Medication Sig Start Date End Date Taking? Authorizing Provider  acetaminophen  (TYLENOL ) 650 MG CR tablet Take 1,300 mg by mouth every 8 (eight) hours as needed for pain.    [provider]  amLODipine  (NORVASC ) 10 MG tablet TAKE (1) TABLET BY MOUTH ONCE DAILY. 12/29/23   Anner Alm LELON, MD  atorvastatin  (LIPITOR) 40 MG tablet Take 1 tablet (40 mg total) by mouth daily. Patient taking differently: Take 40 mg by mouth every evening. 12/02/23 04/28/24  Anner Alm LELON, MD  azelastine  (ASTELIN ) 0.1 % nasal spray Place 1 spray into the nose daily as needed (allergies).    [provider]  clobetasol (TEMOVATE) 0.05 % external solution Apply 1 application topically daily as needed (irritation). 03/01/19   [provider]  Coenzyme Q10 (COQ10) 100 MG CAPS Take 100 mg by mouth daily.    [provider]  cyclobenzaprine  (FLEXERIL ) 10 MG tablet Take 10 mg by mouth 3 (three) times daily. 03/15/24   [provider]  dexamethasone  (DECADRON ) 1 MG tablet Take 1 mg by mouth See admin instructions. As directed Take 4 tablets by mouth daily with breakfast for 1 day, THEN 2 tabs daily with breakfast for 10 days, THEN 1 tab daily with breakfast for 14 days.[SOLD:04/26/2024] 04/26/24 05/21/24  [provider]   EPINEPHrine  0.3 mg/0.3 mL IJ SOAJ injection Inject 0.3 mg into the muscle as needed for anaphylaxis. 12/25/18   [provider]  ezetimibe  (ZETIA ) 10 MG tablet Take 10 mg by mouth daily.    [provider]  gabapentin  (NEURONTIN ) 100 MG capsule Take 100 mg by mouth at bedtime as needed (pain).    [provider]  hydrOXYzine (ATARAX) 25 MG tablet Take 25-50 mg by mouth at bedtime. 03/15/24   [provider]  isosorbide  mononitrate (IMDUR ) 60 MG 24 hr tablet TAKE (1) TABLET BY MOUTH ONCE DAILY. 12/08/23   Anner Alm LELON, MD  lisinopril  (ZESTRIL ) 40 MG tablet TAKE 1/2 TABLET BY MOUTH ONCE DAILY. Patient taking differently: Take 40 mg by mouth daily. 07/10/22   Anner Alm LELON, MD  loperamide  (IMODIUM ) 2 MG capsule Take 1 capsule (2 mg total) by mouth 4 (four) times daily as needed for diarrhea or loose stools. 03/30/19   Theadore Ozell HERO, MD  magnesium  gluconate (MAGONATE) 500 MG tablet Take 500 mg by mouth daily.    [provider]  meloxicam (MOBIC) 15 MG tablet Take 15 mg by mouth daily as needed for pain.    [provider]  ondansetron  (ZOFRAN -ODT) 4 MG disintegrating tablet 4mg  ODT q4 hours prn nausea/vomit 04/28/24   Zammit, Joseph, MD  OVER THE COUNTER MEDICATION Take 2 Capfuls by mouth daily. HistaQuel (Histamine)    [provider]  OVER THE COUNTER MEDICATION Take  1 capsule by mouth daily. Blood Sugar Formula    [provider]  oxyCODONE  (OXY IR/ROXICODONE ) 5 MG immediate release tablet Take 5 mg by mouth every 4 (four) hours as needed for moderate pain (pain score 4-6). 12/09/23   [provider]  prednisoLONE acetate (PRED FORTE) 1 % ophthalmic suspension Place 1 drop into both eyes 2 (two) times daily as needed (irritation). 03/08/21   [provider]  tobramycin (TOBREX) 0.3 % ophthalmic solution Place 1 drop into both eyes daily as needed (for eye irritation). 02/20/21   [provider]   triazolam  (HALCION ) 0.125 MG tablet Take 1.5 tablets (0.1875 mg total) by mouth at bedtime. Pt takes 1.5 tabs at bedtime 04/19/24   Anner Alm ORN, MD  VITAMIN D  PO Take 1 tablet by mouth daily.    [provider]    Allergies: Apremilast, Aspirin , Leflunomide, Methotrexate, Rosuvastatin calcium , and Other    Review of Systems  Updated Vital Signs BP (!) 168/112 (BP Location: Right Arm)   Pulse (!) 124   Temp 97.9 F (36.6 C) (Oral)   Resp 16   Wt 104.3 kg   SpO2 92%   BMI 31.19 kg/m   Physical Exam Constitutional:      General: He is in acute distress.  HENT:     Head: Normocephalic and atraumatic.  Eyes:     Conjunctiva/sclera: Conjunctivae normal.     Pupils: Pupils are equal, round, and reactive to light.  Cardiovascular:     Rate and Rhythm: Regular rhythm. Tachycardia present.  Pulmonary:     Effort: Pulmonary effort is normal. No respiratory distress.  Abdominal:     General: There is no distension.     Tenderness: There is abdominal tenderness.  Skin:    General: Skin is warm and dry.  Neurological:     General: No focal deficit present.     Mental Status: He is alert. Mental status is at baseline.  Psychiatric:        Mood and Affect: Mood normal.        Behavior: Behavior normal.     (all labs ordered are listed, but only abnormal results are displayed) Labs Reviewed  LIPASE, BLOOD  COMPREHENSIVE METABOLIC PANEL WITH GFR  CBC  URINALYSIS, ROUTINE W REFLEX MICROSCOPIC    EKG: None  Radiology: No results found.  {Document cardiac monitor, telemetry assessment procedure when appropriate:32947} Procedures   Medications Ordered in the ED  fentaNYL  (SUBLIMAZE ) injection 50 mcg (has no administration in time range)    Clinical Course as of 05/05/24 2029  Wed May 05, 2024  2016 Complete relief of pain with foley placement, 900 cc fluids [MT]    Clinical Course User Index [MT] Georgine Wiltse, Donnice PARAS, MD   {Click here for ABCD2, HEART  and other calculators REFRESH Note before signing:1}                              Medical Decision Making Amount and/or Complexity of Data Reviewed Labs: ordered.  Risk Prescription drug management.   This patient presents to the ED with concern for suprapubic pain, abdominal discomfort. This involves an extensive number of treatment options, and is a complaint that carries with it a high risk of complications and morbidity.  The differential diagnosis includes urinary retention versus UTI versus ureteral stone versus diverticulitis or colitis versus other  Co-morbidities that complicate the patient evaluation: History of BPH at high  risk of retention  Additional history obtained from family members at bedside  External records from outside source obtained and reviewed including hospitalization recently for metastatic melanoma to the brain  I ordered and personally interpreted labs.  The pertinent results include:  ***  I ordered imaging studies including *** I independently visualized and interpreted imaging which showed *** I agree with the radiologist interpretation  The patient was maintained on a cardiac monitor.  I personally viewed and interpreted the cardiac monitored which showed an underlying rhythm of: ***  Per my interpretation the patient's ECG shows ***  I ordered medication including ***  for *** I have reviewed the patients home medicines and have made adjustments as needed  Test Considered: ***  I requested consultation with the ***,  and discussed lab and imaging findings as well as pertinent plan - they recommend: ***  After the interventions noted above, I reevaluated the patient and found that they have: {resolved/improved/worsened:23923::improved}  Social Determinants of Health:***  Dispostion:  After consideration of the diagnostic results and the patients response to treatment, I feel that the patent would benefit from ***.    {Document critical  care time when appropriate  Document review of labs and clinical decision tools ie CHADS2VASC2, etc  Document your independent review of radiology images and any outside records  Document your discussion with family members, caretakers and with consultants  Document social determinants of health affecting pt's care  Document your decision making why or why not admission, treatments were needed:32947:::1}   Final diagnoses:  None    ED Discharge Orders     None

## 2024-05-06 NOTE — Unmapped External Note (Signed)
 Medication instructions prior to procedure:    Medication Sig INSTRUCTIONS  . acetaminophen  (TYLENOL ) 325 MG tablet Take by mouth every 8 (eight) hours as needed for Pain DO NOT TAKE day of procedure  . aflibercept  (EYLEA  IZ) by Intravitreal route every 6 (six) weeks Eye injections for Macular Degeneration. DO NOT TAKE day of procedure  . amLODIPine  (NORVASC ) 10 MG tablet Take 10 mg by mouth once daily TAKE day of procedure  . atorvastatin  (LIPITOR) 40 MG tablet Take 40 mg by mouth once daily TAKE day of procedure  . cholecalciferol  1000 unit tablet Take 1,000 Units by mouth once daily DO NOT TAKE day of procedure  . dexAMETHasone  (DECADRON ) 1 MG tablet Take 4 tablets (4 mg total) by mouth daily with breakfast for 1 day, THEN 2 tablets (2 mg total) daily with breakfast for 10 days, THEN 1 tablet (1 mg total) daily with breakfast for 14 days. Follow the surgeon's instructions  . Herbal Supplement Take 2 capsules by mouth once daily Herbal Name: Glucoswitch HOLD prior to procedure. Take last dose 05/03/24  . hydrocortisone  2.5 % ointment Apply topically as needed DO NOT TAKE day of procedure  . hydrOXYzine (ATARAX) 25 MG tablet Take 1-2 tablets (25-50 mg) nightly for itching. (Patient taking differently: Take 50 mg by mouth at bedtime Take 1-2 tablets (25-50 mg) nightly for itching.) DO NOT TAKE day of procedure  . isosorbide  mononitrate (IMDUR ) 60 MG ER tablet Take 60 mg by mouth once daily TAKE day of procedure  . lisinopriL  (ZESTRIL ) 40 MG tablet Take 1 tablet by mouth once daily DO NOT TAKE day of procedure  . metoprolol  succinate (TOPROL -XL) 25 MG XL tablet Take 25 mg by mouth once daily TAKE day of procedure  . ondansetron  (ZOFRAN -ODT) 8 MG disintegrating tablet Take 1 tablet (8 mg total) by mouth every 8 (eight) hours as needed for Nausea Place 1 tablet (8 mg) under tongue and allow to disintegrate every 8 hours as needed for nausea. TAKE day of procedure, if needed  . polyethylene glycol  (MIRALAX ) powder Take 17 g by mouth once daily Mix in 4-8ounces of fluid prior to taking. DO NOT TAKE day of procedure  . sennosides (SENOKOT) 8.6 mg tablet Take 1 tablet by mouth once daily (Patient taking differently: Take 1 tablet by mouth once daily as needed) DO NOT TAKE day of procedure  . tamsulosin  (FLOMAX ) 0.4 mg capsule Take 0.4 mg by mouth once daily TAKE day of procedure  . tobramycin (TOBREX) 0.3 % ophthalmic solution Place 1 drop into both eyes as needed (Eyelea injections) DO NOT TAKE day of procedure  . triamcinolone  0.1 % cream Apply topically two (2) to three (3) times daily as needed for itching. DO NOT TAKE day of procedure  . triazolam  (HALCION ) 0.25 MG tablet Take 0.375 mg by mouth at bedtime as needed TAKE night before procedure  . valACYclovir  (VALTREX ) 500 MG tablet Take 1 tablet (500 mg total) by mouth 2 (two) times daily For 15 days TAKE night before procedure   Please follow any specific instructions/medications given by your surgical/procedural team.   Night before and morning of surgery, shower with antibacterial soap (Dial, Lever 2000, Hibiclens , chlorohexidine) avoiding the face and genitals.  No lotions afterwards.  No food after midnight the night before surgery including mints, gum, candy Morning of surgery, take above medications as directed with sips of water  Preop  will give clear liquid instructions day before procedure.  HOLD  NSAIDs (ibuprofen , advil , aleve),  aspirin  containing products (Goody's powder) vitamins and supplements for 7 days prior to surgery unless otherwise directed by your prescribing provider or surgeon. Tylenol  Ok unless otherwise instructed.  Bring CPAP/BiPAP. If applicable. Remove all jewelry, makeup, piercings, contact lenses, if applicable.   [facility] surgeries:  You will be contacted 1 business prior to your surgery after 2 PM.  If you do not receive a call by 4:30 PM, please call [phone].  No food  after midnight the night before surgery including mints, gum, candy Morning of surgery, take above medications as directed with sips of water   Arthur Rubenstein, NP Preoperative Anesthesia & Surgical Screening  [phone] option 4

## 2024-05-06 NOTE — Telephone Encounter (Signed)
 LVM for patient to call back to schedule. Arthur Lutz does not have any openings at this time in [place] and is currently at max. Patient will need to be seen in [place].   Thanks!

## 2024-05-08 ENCOUNTER — Other Ambulatory Visit: Payer: Self-pay

## 2024-05-08 ENCOUNTER — Encounter (HOSPITAL_COMMUNITY): Payer: Self-pay

## 2024-05-08 ENCOUNTER — Emergency Department (HOSPITAL_COMMUNITY)

## 2024-05-08 ENCOUNTER — Emergency Department (HOSPITAL_COMMUNITY)
Admission: EM | Admit: 2024-05-08 | Discharge: 2024-05-08 | Disposition: A | Discharge status: Home / Self Care | Attending: Emergency Medicine | Admitting: Emergency Medicine

## 2024-05-08 DIAGNOSIS — N452 Orchitis: Secondary | ICD-10-CM | POA: Diagnosis not present

## 2024-05-08 DIAGNOSIS — N3001 Acute cystitis with hematuria: Secondary | ICD-10-CM | POA: Insufficient documentation

## 2024-05-08 DIAGNOSIS — Z85848 Personal history of malignant neoplasm of other parts of nervous tissue: Secondary | ICD-10-CM | POA: Diagnosis not present

## 2024-05-08 DIAGNOSIS — R300 Dysuria: Secondary | ICD-10-CM | POA: Diagnosis present

## 2024-05-08 LAB — URINALYSIS, W/ REFLEX TO CULTURE (INFECTION SUSPECTED)
Bilirubin Urine: NEGATIVE
Glucose, UA: >=500 mg/dL — AB
Ketones, ur: NEGATIVE mg/dL
Nitrite: POSITIVE — AB
Protein, ur: 100 mg/dL — AB
RBC / HPF: >50 RBC/hpf (ref 0–5)
Specific Gravity, Urine: 1.026 (ref 1.005–1.030)
WBC, UA: >50 WBC/hpf (ref 0–5)
pH: 6 (ref 5.0–8.0)

## 2024-05-08 MED ORDER — CEFUROXIME AXETIL 500 MG PO TABS: 500.0000 mg | ORAL_TABLET | Freq: Two times a day (BID) | ORAL | 0 refills | Status: DC

## 2024-05-08 MED ORDER — LEVOFLOXACIN 500 MG PO TABS
500.0000 mg | ORAL_TABLET | Freq: Once | ORAL | Status: AC
  Administered 2024-05-08: 500 mg via ORAL
  Filled 2024-05-08: qty 1

## 2024-05-08 MED ORDER — CEFUROXIME AXETIL 250 MG PO TABS
500.0000 mg | ORAL_TABLET | Freq: Two times a day (BID) | ORAL | Status: DC
  Filled 2024-05-08: qty 2

## 2024-05-08 MED ORDER — LEVOFLOXACIN 500 MG PO TABS: 500.0000 mg | ORAL_TABLET | Freq: Every day | ORAL | 0 refills | Status: AC

## 2024-05-08 NOTE — ED Provider Notes (Signed)
 [place] EMERGENCY DEPARTMENT AT [facility] Provider Note   CSN: [id number] Arrival date & time: 05/08/24  9143     Patient presents with: Dysuria   Arthur Lutz is a 83 y.o. male.  Has history of melanoma with metastatic cancer to brain and secondary malignant neoplasm of brain, with planned surgery coming up at [facility] this week.  He is here today for evaluation of hematuria and urinary catheter pain.  He was seen on 05/05/2024 for suprapubic pain and was found to have urinary retention and he had immediate relief with catheter placement but since going always to having constant urge to urinate and then when he does have drainage of urine into the bag, he has a lot of pain as the urine drains.  His bag was full this morning.  He denies fever chills or abdominal pain.  His son and daughter-in-law are at bedside who help provide history and they state that since before he had the urinary retention he has been complaining of left testicular pain.    Dysuria Presenting symptoms: dysuria        Prior to Admission medications   Medication Sig Start Date End Date Taking? Authorizing Provider  levofloxacin (LEVAQUIN) 500 MG tablet Take 1 tablet (500 mg total) by mouth daily. 05/08/24  Yes Kaizley Aja A, PA-C  acetaminophen  (TYLENOL ) 650 MG CR tablet Take 1,300 mg by mouth every 8 (eight) hours as needed for pain.    [provider]  amLODipine  (NORVASC ) 10 MG tablet TAKE (1) TABLET BY MOUTH ONCE DAILY. 12/29/23   Anner Alm LELON, MD  atorvastatin  (LIPITOR) 40 MG tablet Take 1 tablet (40 mg total) by mouth daily. Patient taking differently: Take 40 mg by mouth every evening. 12/02/23 04/28/24  Anner Alm LELON, MD  azelastine  (ASTELIN ) 0.1 % nasal spray Place 1 spray into the nose daily as needed (allergies).    [provider]  clobetasol (TEMOVATE) 0.05 % external solution Apply 1 application topically daily as needed (irritation). 03/01/19   [provider]  Coenzyme Q10 (COQ10) 100 MG CAPS Take 100 mg by mouth daily.    [provider]  cyclobenzaprine  (FLEXERIL ) 10 MG tablet Take 10 mg by mouth 3 (three) times daily. 03/15/24   [provider]  dexamethasone  (DECADRON ) 1 MG tablet Take 1 mg by mouth See admin instructions. As directed Take 4 tablets by mouth daily with breakfast for 1 day, THEN 2 tabs daily with breakfast for 10 days, THEN 1 tab daily with breakfast for 14 days.[SOLD:04/26/2024] 04/26/24 05/21/24  [provider]  EPINEPHrine  0.3 mg/0.3 mL IJ SOAJ injection Inject 0.3 mg into the muscle as needed for anaphylaxis. 12/25/18   [provider]  ezetimibe  (ZETIA ) 10 MG tablet Take 10 mg by mouth daily.    [provider]  gabapentin  (NEURONTIN ) 100 MG capsule Take 100 mg by mouth at bedtime as needed (pain).    [provider]  hydrOXYzine (ATARAX) 25 MG tablet Take 25-50 mg by mouth at bedtime. 03/15/24   [provider]  isosorbide  mononitrate (IMDUR ) 60 MG 24 hr tablet TAKE (1) TABLET BY MOUTH ONCE DAILY. 12/08/23   Anner Alm LELON, MD  lisinopril  (ZESTRIL ) 40 MG tablet TAKE 1/2 TABLET BY MOUTH ONCE DAILY. Patient taking differently: Take 40 mg by mouth daily. 07/10/22   Anner Alm LELON, MD  loperamide  (IMODIUM ) 2 MG capsule Take 1 capsule (2 mg total) by mouth 4 (four) times daily as needed for  diarrhea or loose stools. 03/30/19   Theadore Ozell HERO, MD  magnesium  gluconate (MAGONATE) 500 MG tablet Take 500 mg by mouth daily.    [provider]  meloxicam (MOBIC) 15 MG tablet Take 15 mg by mouth daily as needed for pain.    [provider]  ondansetron  (ZOFRAN -ODT) 4 MG disintegrating tablet 4mg  ODT q4 hours prn nausea/vomit 04/28/24   Zammit, Joseph, MD  OVER THE COUNTER MEDICATION Take 2 Capfuls by mouth daily. HistaQuel (Histamine)    [provider]  OVER THE COUNTER MEDICATION Take 1 capsule by mouth daily. Blood Sugar Formula     [provider]  oxyCODONE  (OXY IR/ROXICODONE ) 5 MG immediate release tablet Take 5 mg by mouth every 4 (four) hours as needed for moderate pain (pain score 4-6). 12/09/23   [provider]  prednisoLONE acetate (PRED FORTE) 1 % ophthalmic suspension Place 1 drop into both eyes 2 (two) times daily as needed (irritation). 03/08/21   [provider]  tobramycin (TOBREX) 0.3 % ophthalmic solution Place 1 drop into both eyes daily as needed (for eye irritation). 02/20/21   [provider]  triazolam  (HALCION ) 0.125 MG tablet Take 1.5 tablets (0.1875 mg total) by mouth at bedtime. Pt takes 1.5 tabs at bedtime 04/19/24   Anner Alm ORN, MD  VITAMIN D  PO Take 1 tablet by mouth daily.    [provider]    Allergies: Apremilast, Aspirin , Leflunomide, Methotrexate, Rosuvastatin calcium , and Other    Review of Systems  Genitourinary:  Positive for dysuria.    Updated Vital Signs BP (!) 140/72   Pulse 65   Temp 97.8 F (36.6 C) (Oral)   Resp 18   Ht 6' (1.829 m)   Wt 104.3 kg   SpO2 90%   BMI 31.19 kg/m   Physical Exam Vitals and nursing note reviewed.  Constitutional:      General: He is not in acute distress.    Appearance: He is well-developed.  HENT:     Head: Normocephalic and atraumatic.     Mouth/Throat:     Mouth: Mucous membranes are moist.  Eyes:     Conjunctiva/sclera: Conjunctivae normal.  Cardiovascular:     Rate and Rhythm: Normal rate and regular rhythm.     Heart sounds: No murmur heard. Pulmonary:     Effort: Pulmonary effort is normal. No respiratory distress.     Breath sounds: Normal breath sounds.  Abdominal:     Palpations: Abdomen is soft.     Tenderness: There is no abdominal tenderness.  Genitourinary:    Comments: Some irritation at the urethral meatus with no leakage around the Foley catheter, no testicular or scrotal swelling but there is left testicular tenderness.  No crepitus.  Exam chaperoned by OBIE Clap Musculoskeletal:        General: No swelling.     Cervical back: Neck supple.  Skin:    General: Skin is warm and dry.     Capillary Refill: Capillary refill takes less than 2 seconds.  Neurological:     General: No focal deficit present.     Mental Status: He is alert. Mental status is at baseline.  Psychiatric:        Mood and Affect: Mood normal.     (all labs ordered are listed, but only abnormal results are displayed) Labs Reviewed  URINALYSIS, W/ REFLEX TO CULTURE (INFECTION SUSPECTED) - Abnormal; Notable for the following components:      Result Value  Color, Urine AMBER (*)    APPearance CLOUDY (*)    Glucose, UA >=500 (*)    Hgb urine dipstick LARGE (*)    Protein, ur 100 (*)    Nitrite POSITIVE (*)    Leukocytes,Ua MODERATE (*)    Bacteria, UA RARE (*)    All other components within normal limits  URINE CULTURE    EKG: None  Radiology: US  SCROTUM W/DOPPLER Result Date: 05/08/2024 CLINICAL DATA:  Left testicular pain x1 day. EXAM: SCROTAL ULTRASOUND DOPPLER ULTRASOUND OF THE TESTICLES TECHNIQUE: Complete ultrasound examination of the testicles, epididymis, and other scrotal structures was performed. Color and spectral Doppler ultrasound were also utilized to evaluate blood flow to the testicles. COMPARISON:  Nov 26, 2005 FINDINGS: Right testicle Measurements: 3.6 cm x 1.8 cm x 2.3 cm. No mass or microlithiasis visualized. Left testicle Measurements: 3.4 cm x 1.8 cm x 1.9 cm. No mass or microlithiasis visualized. Diffusely increased flow is noted throughout the left testicle on color Doppler evaluation. Right epididymis:  The right epididymis is limited in evaluation. Left epididymis: A 4.8 mm x 5.7 mm x 4.1 mm left epididymal cyst is seen. Hydrocele: Large bilateral hydroceles are seen, left greater than right, each containing a mild amount of echogenic debris. Varicocele:  None visualized. Pulsed Doppler interrogation of both testes demonstrates normal low  resistance arterial and venous waveforms bilaterally. Other: The study is technically limited secondary to uncontrollable shaking by the patient throughout the exam, as per the ultrasound technologist. IMPRESSION: 1. Findings consistent with left-sided orchitis. 2. Large, bilateral mildly complex hydroceles which may represent sequelae related to a prior infectious and/or inflammatory process. Electronically Signed   By: Suzen Dials M.D.   On: 05/08/2024 10:29     Procedures   Medications Ordered in the ED  levofloxacin (LEVAQUIN) tablet 500 mg (has no administration in time range)                                    Medical Decision Making Differential diagnosis includes but limited to mechanical malfunction of Foley catheter, UTI, orchitis, epididymitis, other  ED course: Patient here for primarily discomfort with his Foley catheter, on exam he has some irritation to the urethral meatus though no leakage is noted.  Since his only intermittently draining he is having pain with drainage we discussed replacing the Foley catheter to ensure that it is placed correctly.  We can then obtain a fresh sample for urinalysis to evaluate for possible infection.  Family is also concerned because complaining of persistent left testicular pain that has been ongoing since before he had a urinary retention.  They are agreeable with ultrasound today.  He has no swelling, no horizontal lie and the pain has been ongoing for weeks so I do not suspect torsion.   Patient does have a UTI on his UA and has orchitis on ultrasound of left testicle with bilateral hydroceles.  Patient advised on urology follow-up.  Given concurrent UTI and orchitis will treat with levofloxacin.  Discussed with patient and family.  I reviewed his EKG from 3 days ago that shows normal QTc prior to ordering this.  Patient has upcoming craniotomy for brain tumor at [facility] this week, I discussed with his family that they should inform them  of today's visit on Monday.  Of note on patient's vitals he has been between 88 and 91% on room air, he did briefly drop  to 27.  He is not short of breath he states this is what his oxygen always is and has no wheezing, no chest pain, patient and family do not want or feel like patient needs any further workup for this as it is his baseline.   Amount and/or Complexity of Data Reviewed External Data Reviewed: labs and notes. Labs: ordered.    Details: Urinalysis shows positive nitrite, moderate leukocytes, greater than 50 red blood cells/hpf and greater than 50 white blood cells/hpf, rare bacteria and white blood cell clumps Radiology: ordered.    Details: Sono shows orchitis of the left testicle with hydroceles bilateral per radiology read.  Risk Prescription drug management.        Final diagnoses:  Acute cystitis with hematuria  Orchitis of left testicle    ED Discharge Orders          Ordered    cefUROXime (CEFTIN) 500 MG tablet  2 times daily with meals,   Status:  Discontinued        05/08/24 1018    levofloxacin (LEVAQUIN) 500 MG tablet  Daily        05/08/24 [address] PA-C 05/08/24 1112    Charlyn Sora, MD 05/09/24 1532

## 2024-05-08 NOTE — ED Notes (Signed)
 EDPA at [facility] updating pt and family

## 2024-05-08 NOTE — ED Triage Notes (Signed)
 Pt to er, son at bedside, states that pt was here earlier in the week with urinary retention, states that they placed a catheter and he had immediate pain relief, states that he is here for feelings like he needs to pee and increased pain when the foley drains.

## 2024-05-08 NOTE — Discharge Instructions (Addendum)
 Was a pleasure taking care of you today.  You seen for difficulty with urinary catheter and left testicular pain.  Your urinalysis shows a urinary tract infection which will be treated with antibiotics.  Your ultrasound also showed left-sided orchitis which would account for your left-sided testicular pain.  We are to treat these both with the same antibiotic-Levafloxacin.  Someone agree to follow-up closely with the urologist.  Call your surgeon at [facility] since you have upcoming surgery to know that you have an infection and are on antibiotics prior to surgery. Come back to the ER for any new or worsening symptoms.

## 2024-05-08 NOTE — ED Notes (Signed)
 EDPA at [facility]. Foley exchanged. Family at [facility].

## 2024-05-08 NOTE — ED Notes (Signed)
 [place] at [facility]

## 2024-05-08 NOTE — ED Notes (Signed)
 ABT given. EDPA into room giving update. Son at [facility]. Supplemental O2 [place] 2L applied.

## 2024-05-10 LAB — URINE CULTURE: Culture: >=100000 — AB

## 2024-05-14 ENCOUNTER — Telehealth: Payer: Self-pay | Admitting: Cardiology

## 2024-05-14 NOTE — Telephone Encounter (Signed)
  STAT if HR is under 50 or over 120 (normal HR is 60-100 beats per minute)  What is your heart rate? 45  Do you have a log of your heart rate readings (document readings)?   Do you have any other symptoms? Don from [facility] would like to ask parameters.

## 2024-05-14 NOTE — Telephone Encounter (Signed)
 Spoke with Arthur Lutz, OT from [facility] regarding pt's heart rate at rest. Pt's current heart rate at rest is 45bpm and 62bpm with activity. Arthur explains that he has to call with heartrates lower than 60 unless new parameters are ordered by cardiologist. Pt is not experiencing any symptoms and states that this is completely normal for him. Pt's blood pressure today with Arthur is 138/70. Pt is not complaining of dizziness with low heart rates. Advised that with the absence of symptoms this heart rate is not concerning. Arthur would like to know if pt can continue with OT, advised that as long as pt has no symptoms he can continue. Arthur does mention recent surgery at [facility] for pt, craniotomy for brain metastasized tumor done on 10/13. Arthur Lutz reinterates that pt says that these heart rates are normal for him. Advised that I would send note over to Dr. Anner to advise.   Arthur has a Primary school teacher for follow up with recommendations or change in parameters. Arthur would like us  to call the pt if he requires and office visit.

## 2024-05-16 NOTE — Telephone Encounter (Signed)
 As long as he is asymptomatic, I agree that he is fine going forward with PT/OT.  He is not on any beta-blockers or calcium  channel blockers ever slows heart rate down. As long as his heart reasonably get up above 50 with walking or doing activity, okay to proceed with PT.  Alm Clay, MD

## 2024-06-01 ENCOUNTER — Other Ambulatory Visit: Payer: Self-pay

## 2024-06-03 NOTE — Telephone Encounter (Signed)
 Pt of Dr. Anner. Does Dr. Anner want to refill this RX? Please advise.

## 2024-06-08 NOTE — Progress Notes (Signed)
 Arthur Lutz

## 2024-07-01 ENCOUNTER — Other Ambulatory Visit: Payer: Self-pay | Admitting: Cardiology
